# Patient Record
Sex: Female | Born: 1935 | Race: White | Hispanic: No | Smoking: Never smoker
Health system: Southern US, Community
[De-identification: ages and names within clinical notes are randomized; demographics above are authoritative.]

## PROBLEM LIST (undated history)

## (undated) DIAGNOSIS — E7800 Pure hypercholesterolemia, unspecified: Secondary | ICD-10-CM

## (undated) DIAGNOSIS — G250 Essential tremor: Secondary | ICD-10-CM

## (undated) DIAGNOSIS — Z9889 Other specified postprocedural states: Secondary | ICD-10-CM

## (undated) DIAGNOSIS — K219 Gastro-esophageal reflux disease without esophagitis: Secondary | ICD-10-CM

## (undated) DIAGNOSIS — I10 Essential (primary) hypertension: Secondary | ICD-10-CM

## (undated) DIAGNOSIS — R112 Nausea with vomiting, unspecified: Secondary | ICD-10-CM

## (undated) DIAGNOSIS — R42 Dizziness and giddiness: Secondary | ICD-10-CM

## (undated) HISTORY — PX: PACEMAKER IMPLANT: EP1218

---

## 2016-09-15 DIAGNOSIS — I10 Essential (primary) hypertension: Secondary | ICD-10-CM | POA: Diagnosis present

## 2016-09-15 DIAGNOSIS — E782 Mixed hyperlipidemia: Secondary | ICD-10-CM | POA: Diagnosis present

## 2016-09-15 DIAGNOSIS — I340 Nonrheumatic mitral (valve) insufficiency: Secondary | ICD-10-CM | POA: Insufficient documentation

## 2017-11-02 DIAGNOSIS — K219 Gastro-esophageal reflux disease without esophagitis: Secondary | ICD-10-CM | POA: Diagnosis present

## 2021-09-07 ENCOUNTER — Inpatient Hospital Stay (HOSPITAL_COMMUNITY)
Admission: AD | Admit: 2021-09-07 | Discharge: 2021-09-11 | DRG: 445 | Disposition: A | Discharge status: Home / Self Care | Payer: Medicare HMO | Source: Other Acute Inpatient Hospital | Attending: Internal Medicine | Admitting: Internal Medicine

## 2021-09-07 DIAGNOSIS — K838 Other specified diseases of biliary tract: Secondary | ICD-10-CM | POA: Diagnosis not present

## 2021-09-07 DIAGNOSIS — I495 Sick sinus syndrome: Secondary | ICD-10-CM | POA: Diagnosis present

## 2021-09-07 DIAGNOSIS — E8809 Other disorders of plasma-protein metabolism, not elsewhere classified: Secondary | ICD-10-CM | POA: Diagnosis present

## 2021-09-07 DIAGNOSIS — K831 Obstruction of bile duct: Secondary | ICD-10-CM | POA: Diagnosis present

## 2021-09-07 DIAGNOSIS — Z950 Presence of cardiac pacemaker: Secondary | ICD-10-CM | POA: Diagnosis not present

## 2021-09-07 DIAGNOSIS — N179 Acute kidney failure, unspecified: Secondary | ICD-10-CM | POA: Diagnosis present

## 2021-09-07 DIAGNOSIS — E876 Hypokalemia: Secondary | ICD-10-CM | POA: Diagnosis present

## 2021-09-07 DIAGNOSIS — K828 Other specified diseases of gallbladder: Secondary | ICD-10-CM | POA: Diagnosis present

## 2021-09-07 DIAGNOSIS — E785 Hyperlipidemia, unspecified: Secondary | ICD-10-CM | POA: Diagnosis present

## 2021-09-07 DIAGNOSIS — K219 Gastro-esophageal reflux disease without esophagitis: Secondary | ICD-10-CM | POA: Diagnosis present

## 2021-09-07 DIAGNOSIS — I10 Essential (primary) hypertension: Secondary | ICD-10-CM | POA: Diagnosis present

## 2021-09-07 DIAGNOSIS — K5900 Constipation, unspecified: Secondary | ICD-10-CM | POA: Diagnosis present

## 2021-09-07 DIAGNOSIS — R17 Unspecified jaundice: Secondary | ICD-10-CM | POA: Diagnosis not present

## 2021-09-07 DIAGNOSIS — Z91041 Radiographic dye allergy status: Secondary | ICD-10-CM

## 2021-09-07 HISTORY — DX: Other specified postprocedural states: Z98.890

## 2021-09-07 HISTORY — DX: Nausea with vomiting, unspecified: R11.2

## 2021-09-07 LAB — COMPREHENSIVE METABOLIC PANEL
ALT: 310 U/L — ABNORMAL HIGH (ref 0–44)
AST: 216 U/L — ABNORMAL HIGH (ref 15–41)
Albumin: 3 g/dL — ABNORMAL LOW (ref 3.5–5.0)
Alkaline Phosphatase: 249 U/L — ABNORMAL HIGH (ref 38–126)
Anion gap: 6 (ref 5–15)
BUN: 10 mg/dL (ref 8–23)
CO2: 25 mmol/L (ref 22–32)
Calcium: 9.4 mg/dL (ref 8.9–10.3)
Chloride: 106 mmol/L (ref 98–111)
Creatinine, Ser: 1.16 mg/dL — ABNORMAL HIGH (ref 0.44–1.00)
GFR, Estimated: 46 mL/min — ABNORMAL LOW (ref >60)
Glucose, Bld: 115 mg/dL — ABNORMAL HIGH (ref 70–99)
Potassium: 3.8 mmol/L (ref 3.5–5.1)
Sodium: 137 mmol/L (ref 135–145)
Total Bilirubin: 6.8 mg/dL — ABNORMAL HIGH (ref 0.3–1.2)
Total Protein: 5.9 g/dL — ABNORMAL LOW (ref 6.5–8.1)

## 2021-09-07 LAB — PROTIME-INR
INR: 1.1 (ref 0.8–1.2)
Prothrombin Time: 13.7 seconds (ref 11.4–15.2)

## 2021-09-07 MED ORDER — PRIMIDONE 50 MG PO TABS
50.0000 mg | ORAL_TABLET | Freq: Every day | ORAL | Status: DC | PRN
  Filled 2021-09-07: qty 1

## 2021-09-07 MED ORDER — MECLIZINE HCL 12.5 MG PO TABS
12.5000 mg | ORAL_TABLET | Freq: Every day | ORAL | Status: DC | PRN
  Filled 2021-09-07: qty 2

## 2021-09-07 MED ORDER — NEBIVOLOL HCL 2.5 MG PO TABS
2.5000 mg | ORAL_TABLET | Freq: Every morning | ORAL | Status: DC
  Filled 2021-09-07: qty 1

## 2021-09-07 MED ORDER — SODIUM CHLORIDE 0.9 % IV SOLN: INTRAVENOUS | Status: AC

## 2021-09-07 MED ORDER — PANTOPRAZOLE SODIUM 40 MG PO TBEC
40.0000 mg | DELAYED_RELEASE_TABLET | Freq: Every day | ORAL | Status: DC
  Administered 2021-09-07 – 2021-09-11 (×4): 40 mg via ORAL
  Filled 2021-09-07 (×4): qty 1

## 2021-09-07 MED ORDER — ENOXAPARIN SODIUM 30 MG/0.3ML IJ SOSY
30.0000 mg | PREFILLED_SYRINGE | INTRAMUSCULAR | Status: DC
  Administered 2021-09-07 – 2021-09-08 (×2): 30 mg via SUBCUTANEOUS
  Filled 2021-09-07 (×4): qty 0.3

## 2021-09-07 MED ORDER — ONDANSETRON HCL 4 MG PO TABS: 4.0000 mg | ORAL_TABLET | Freq: Four times a day (QID) | ORAL | Status: DC | PRN

## 2021-09-07 MED ORDER — PRAVASTATIN SODIUM 40 MG PO TABS: 40.0000 mg | ORAL_TABLET | Freq: Every day | ORAL | Status: DC

## 2021-09-07 MED ORDER — AMLODIPINE BESYLATE 5 MG PO TABS: 2.5000 mg | ORAL_TABLET | Freq: Every day | ORAL | Status: DC

## 2021-09-07 MED ORDER — GABAPENTIN 100 MG PO CAPS
100.0000 mg | ORAL_CAPSULE | Freq: Two times a day (BID) | ORAL | Status: DC
Start: 2021-09-07 — End: 2021-09-11
  Administered 2021-09-07 – 2021-09-11 (×7): 100 mg via ORAL
  Filled 2021-09-07 (×7): qty 1

## 2021-09-07 MED ORDER — TIZANIDINE HCL 2 MG PO TABS
1.0000 mg | ORAL_TABLET | Freq: Every day | ORAL | Status: DC | PRN
  Administered 2021-09-09: 1 mg via ORAL
  Filled 2021-09-07 (×2): qty 0.5

## 2021-09-07 MED ORDER — HYDRALAZINE HCL 25 MG PO TABS
25.0000 mg | ORAL_TABLET | Freq: Four times a day (QID) | ORAL | Status: DC | PRN
  Administered 2021-09-08: 25 mg via ORAL
  Filled 2021-09-07: qty 1

## 2021-09-07 MED ORDER — ONDANSETRON HCL 4 MG/2ML IJ SOLN
4.0000 mg | Freq: Four times a day (QID) | INTRAMUSCULAR | Status: DC | PRN
  Administered 2021-09-09: 4 mg via INTRAVENOUS
  Filled 2021-09-07: qty 2

## 2021-09-07 MED ORDER — ASPIRIN EC 81 MG PO TBEC
81.0000 mg | DELAYED_RELEASE_TABLET | Freq: Every day | ORAL | Status: DC
  Administered 2021-09-09 – 2021-09-10 (×2): 81 mg via ORAL
  Filled 2021-09-07 (×2): qty 1

## 2021-09-07 NOTE — H&P (Addendum)
?History and Physical  ? ? ?Meagan [name] UXN:[id number] DOB: 1935/03/17 DOA: 09/07/2021 ? ?PCP: Abigail Miyamoto, [place] (Confirm with patient/family/NH records and if not entered, this has to be entered at [facility] point of entry) ?Patient coming from: Home ? ?I have personally briefly reviewed patient's old medical records in [place] ? ?Chief Complaint: Skin turned yellow ? ?HPI: Meagan [name] is a 87 y.o. female with medical history significant of HTN, SSS s/p PPM in August 2022, GERD came with new onset of jaundice. ? ?Patient started to notice urine culture returned dark about 10 days ago, with stool color claylike.  She has been trying to drink more water and the whole the urine color will return later but that did not happen.  Denies any abdominal pain no nauseous vomiting.  She has been eating healthy food and exercise more in the last 6 pounds since last year.  No history of alcohol use or cigarette smoke.  Yesterday, her daughter came in and found " skin color turned yellow" and took her to the ED. ? ?ED Course: Afebrile, no tachycardia, blood pressure 183/81.  Blood work showed bilirubin 8.2, AST 260, ALT 263, alkaline phosphorus 307, CT abdominal pelvis normal morphology of liver with dilated gallbladder and gallbladder sludge but no significant malignancy. ? ?Review of Systems: As per HPI otherwise 14 point review of systems negative.  ? ? ?No past medical history on file. ? ? ? has no history on file for tobacco use, alcohol use, and drug use. ? ?Allergies  ?Allergen Reactions  ? Ivp Dye [Iodinated Contrast Media] Hives  ? Lisinopril Cough  ? ? ?No family history on file. ? ? ?Prior to Admission medications   ?Not on File  ? ? ?Physical Exam: ?Vitals:  ? 09/07/21 1517  ?BP: (!) 167/74  ?Pulse: 71  ?Resp: 16  ?Temp: (!) 97.4 ?F (36.3 ?C)  ?TempSrc: Oral  ?SpO2: 100%  ? ? ?Constitutional: NAD, calm, comfortable ?Vitals:  ? 09/07/21 1517  ?BP: (!) 167/74  ?Pulse: 71  ?Resp: 16  ?Temp: (!) 97.4 ?F  (36.3 ?C)  ?TempSrc: Oral  ?SpO2: 100%  ? ?Eyes: PERRL, lids and conjunctivae normal ?ENMT: Mucous membranes are moist. Posterior pharynx clear of any exudate or lesions.Normal dentition.  ?Neck: normal, supple, no masses, no thyromegaly ?Respiratory: clear to auscultation bilaterally, no wheezing, no crackles. Normal respiratory effort. No accessory muscle use.  ?Cardiovascular: Regular rate and rhythm, no murmurs / rubs / gallops. No extremity edema. 2+ pedal pulses. No carotid bruits.  ?Abdomen: no tenderness, no masses palpated. No hepatosplenomegaly. Bowel sounds positive.  ?Musculoskeletal: no clubbing / cyanosis. No joint deformity upper and lower extremities. Good ROM, no contractures. Normal muscle tone.  ?Skin: Jaundice ?Neurologic: CN 2-12 grossly intact. Sensation intact, DTR normal. Strength 5/5 in all 4.  ?Psychiatric: Normal judgment and insight. Alert and oriented x 3. Normal mood.  ? ? ? ?Labs on Admission: I have personally reviewed following labs and imaging studies ? ?CBC: ?No results for input(s): WBC, NEUTROABS, HGB, HCT, MCV, PLT in the last 168 hours. ?Basic Metabolic Panel: ?No results for input(s): NA, K, CL, CO2, GLUCOSE, BUN, CREATININE, CALCIUM, MG, PHOS in the last 168 hours. ?GFR: ?CrCl cannot be calculated (No successful lab value found.). ?Liver Function Tests: ?No results for input(s): AST, ALT, ALKPHOS, BILITOT, PROT, ALBUMIN in the last 168 hours. ?No results for input(s): LIPASE, AMYLASE in the last 168 hours. ?No results for input(s): AMMONIA in the last 168 hours. ?Coagulation Profile: ?  No results for input(s): INR, PROTIME in the last 168 hours. ?Cardiac Enzymes: ?No results for input(s): CKTOTAL, CKMB, CKMBINDEX, TROPONINI in the last 168 hours. ?BNP (last 3 results) ?No results for input(s): PROBNP in the last 8760 hours. ?HbA1C: ?No results for input(s): HGBA1C in the last 72 hours. ?CBG: ?No results for input(s): GLUCAP in the last 168 hours. ?Lipid Profile: ?No results  for input(s): CHOL, HDL, LDLCALC, TRIG, CHOLHDL, LDLDIRECT in the last 72 hours. ?Thyroid Function Tests: ?No results for input(s): TSH, T4TOTAL, FREET4, T3FREE, THYROIDAB in the last 72 hours. ?Anemia Panel: ?No results for input(s): VITAMINB12, FOLATE, FERRITIN, TIBC, IRON, RETICCTPCT in the last 72 hours. ?Urine analysis: ?No results found for: COLORURINE, APPEARANCEUR, [place], [place], [place], [place], [place], KETONESUR, PROTEINUR, [place], NITRITE, LEUKOCYTESUR ? ?Radiological Exams on Admission: ?No results found. ? ?EKG: Ordered ? ?Assessment/Plan ?Principal Problem: ?  Jaundice ? (please populate well all problems here in Problem List. (For example, if patient is on BP meds at home and you resume or decide to hold them, it is a problem that needs to be her. Same for CAD, COPD, HLD and so on) ? ?Painless jaundice ?-Rule out pancreatic/biliary malignancy in her age bracket.  Ordered MRI with contrast.  Discussed with on-call GI Dr. Benson Norway, recommend MRCP and re-consult GI tomorrow after MRI for possible ERCP. NPO after midnight. ?-Check AFP and CA 19-9. ?-Low suspicion for cholangitis or acute cholecystitis at this point given no RUQ pain or fever. ? ?Acute transaminitis ?-Likely secondary to biliary obstruction and liver damage.  Trend LFTs.  Check INR. ? ?HTN, uncontrolled ?-Resume home BP meds ?-As needed hydralazine ? ?GERD ?-Continue PPI ? ?DVT prophylaxis: Lovenox ?Code Status: Full code ?Family Communication: None at bedside ?Disposition Plan: Patient came with painless jaundice, suspicious for.  Malignancy, expect more than 2 midnight hospital stay for GI work-up. ?Consults called:" GI Dr. Benson Norway, reconsult GI tomorrow after MRI ?Marland Kitchen ?Admission status: MedSurg admission ? ? ?Lequita Halt MD ?[facility] ?Pager [phone] ? ?09/07/2021, 4:03 PM  ?  ?

## 2021-09-08 ENCOUNTER — Inpatient Hospital Stay (HOSPITAL_COMMUNITY): Payer: Medicare HMO

## 2021-09-08 DIAGNOSIS — R17 Unspecified jaundice: Secondary | ICD-10-CM | POA: Diagnosis not present

## 2021-09-08 LAB — CBC
HCT: 30.5 % — ABNORMAL LOW (ref 36.0–46.0)
Hemoglobin: 10.4 g/dL — ABNORMAL LOW (ref 12.0–15.0)
MCH: 32 pg (ref 26.0–34.0)
MCHC: 34.1 g/dL (ref 30.0–36.0)
MCV: 93.8 fL (ref 80.0–100.0)
Platelets: 227 10*3/uL (ref 150–400)
RBC: 3.25 MIL/uL — ABNORMAL LOW (ref 3.87–5.11)
RDW: 15.1 % (ref 11.5–15.5)
WBC: 5.7 10*3/uL (ref 4.0–10.5)
nRBC: 0 % (ref 0.0–0.2)

## 2021-09-08 LAB — COMPREHENSIVE METABOLIC PANEL
ALT: 293 U/L — ABNORMAL HIGH (ref 0–44)
AST: 211 U/L — ABNORMAL HIGH (ref 15–41)
Albumin: 2.8 g/dL — ABNORMAL LOW (ref 3.5–5.0)
Alkaline Phosphatase: 214 U/L — ABNORMAL HIGH (ref 38–126)
Anion gap: 6 (ref 5–15)
BUN: 9 mg/dL (ref 8–23)
CO2: 24 mmol/L (ref 22–32)
Calcium: 9 mg/dL (ref 8.9–10.3)
Chloride: 108 mmol/L (ref 98–111)
Creatinine, Ser: 1.1 mg/dL — ABNORMAL HIGH (ref 0.44–1.00)
GFR, Estimated: 50 mL/min — ABNORMAL LOW (ref >60)
Glucose, Bld: 94 mg/dL (ref 70–99)
Potassium: 3.6 mmol/L (ref 3.5–5.1)
Sodium: 138 mmol/L (ref 135–145)
Total Bilirubin: 6.6 mg/dL — ABNORMAL HIGH (ref 0.3–1.2)
Total Protein: 5.3 g/dL — ABNORMAL LOW (ref 6.5–8.1)

## 2021-09-08 MED ORDER — NEBIVOLOL HCL 2.5 MG PO TABS
2.5000 mg | ORAL_TABLET | Freq: Every morning | ORAL | Status: DC
  Administered 2021-09-08 – 2021-09-11 (×4): 2.5 mg via ORAL
  Filled 2021-09-08 (×5): qty 1

## 2021-09-08 MED ORDER — GADOBUTROL 1 MMOL/ML IV SOLN
5.0000 mL | Freq: Once | INTRAVENOUS | Status: AC | PRN
  Administered 2021-09-08: 5 mL via INTRAVENOUS

## 2021-09-08 MED ORDER — AMLODIPINE BESYLATE 5 MG PO TABS
2.5000 mg | ORAL_TABLET | Freq: Every day | ORAL | Status: DC
  Administered 2021-09-08 – 2021-09-10 (×3): 2.5 mg via ORAL
  Filled 2021-09-08 (×3): qty 1

## 2021-09-08 NOTE — Progress Notes (Signed)
?  Transition of Care (TOC) Screening Note ? ? ?Patient Details  ?Name: Meagan [name] ?Date of Birth: 1934/06/18 ? ? ?Transition of Care (TOC) CM/SW Contact:    ?Cyndi Bender, RN ?Phone Number: ?09/08/2021, 8:34 AM ? ? ? ?Transition of Care Department [facility]) has reviewed patient and no TOC needs have been identified at this time. We will continue to monitor patient advancement through interdisciplinary progression rounds. If new patient transition needs arise, please place a TOC consult. ? ? ?

## 2021-09-08 NOTE — Consult Note (Addendum)
? ?Referring Provider: Dr. Shelly Coss ?Primary Care Physician:  Abigail Miyamoto, [place] ?Primary Gastroenterologist:  Dr. Althia Forts  ? ?Reason for Consultation: Elevated LFTs ? ?HPI: Meagan [name] is a 87 y.o. female  with a past medical history of hypertension, sick sinus syndrome s/p PPM 06/30/2021 and GERD.  She noticed her urine was darker amber in color which started about 10 days ago and her stools were pale beige in colon.  She had mild stomach discomfort for few days without nausea/vomiting or significant abdominal pain.  She contacted her PCP and she was sent to the to [facility] ED 09/07/2021 further admission.  Labs in the ED showed elevated LFTs.  CTAP without contrast showed a simple left liver cyst and a mildly dilated gallbladder with hyperdense fluid suggestive of sludge without evidence of gallstones.  A moderate amount of stool was noted in the colon.  She was transferred to [facility] for further evaluation and for consideration for abdominal MRI/MRCP if her pacemaker patible for MR imaging. Admission labs showed a total bilirubin level of 6.8.  Alk phos 249.  AST 216.  ALT 310.  WBC 5.7.  Hemoglobin 10.4.  Hematocrit 30.5.  Platelet 227.  INR 1.1.  AFP pending CA 19-9 pending.  She vomited shortly after she was admitted to [facility] which she attributed to having motion sickness during ambulance transport.  No further vomiting since then.  She describes having a vague upper abdominal discomfort which started about 1 week ago.  No new medications within the past 6 months with the exception of taking Keflex for 3 days post pacemaker placement 06/2021.  She has been on Pravachol for many years.  No NSAID use.  No alcohol use.  He has lost 5 pounds over the past year.  No fever, sweats or chills.  No family history of liver or pancreatic cancer.  She underwent 3 colonoscopies in her lifetime which she reported were all normal.  Last colonoscopy was in her mid 65s.  She is a  remote history of GERD which is well controlled on Omeprazole 40 mg daily she denies ever having an EGD.  Her sister is at the bedside.  No other complaints at this time. ? ? ? ? ? ?Prior to Admission medications   ?Medication Sig Start Date End Date Taking? Authorizing Provider  ?amLODipine (NORVASC) 2.5 MG tablet Take 2.5 mg by mouth at bedtime.   Yes [provider]  ?aspirin EC 81 MG tablet Take 81 mg by mouth daily after supper. Swallow whole.   Yes [provider]  ?Cholecalciferol (VITAMIN D3) 50 MCG (2000 UT) TABS Take 2,000 Units by mouth daily after supper.   Yes [provider]  ?Coenzyme Q10 (CO Q-10) 100 MG CAPS Take 100 mg by mouth every evening.   Yes [provider]  ?gabapentin (NEURONTIN) 100 MG capsule Take 100 mg by mouth 2 (two) times daily.   Yes [provider]  ?meclizine (ANTIVERT) 25 MG tablet Take 12.5-25 mg by mouth daily as needed for dizziness.   Yes [provider]  ?Misc Natural Products (JOINT SUPPORT) CAPS Take 1 capsule by mouth daily with breakfast.   Yes [provider]  ?Multiple Vitamin (MULTIVITAMIN) tablet Take 1 tablet by mouth daily with breakfast.   Yes [provider]  ?Multiple Vitamins-Minerals ([place]) CAPS Take 1 capsule by mouth daily.   Yes [provider]  ?nebivolol (BYSTOLIC) 5 MG tablet Take 2.5 mg by  mouth in the morning.   Yes [provider]  ?omeprazole (PRILOSEC) 40 MG capsule Take 40 mg by mouth daily before breakfast.   Yes [provider]  ?pravastatin (PRAVACHOL) 40 MG tablet Take 40 mg by mouth daily after supper.   Yes [provider]  ?primidone (MYSOLINE) 50 MG tablet Take 50 mg by mouth daily as needed (for tremors).   Yes [provider]  ?pyridOXINE (VITAMIN B-6) 50 MG tablet Take 50 mg by mouth daily.   Yes [provider]  ?tiZANidine (ZANAFLEX) 2 MG tablet Take 1 mg by mouth daily as needed for  muscle spasms.   Yes [provider]  ? ? ?Current Facility-Administered Medications  ?Medication Dose Route Frequency Provider Last Rate Last Admin  ? aspirin EC tablet 81 mg  81 mg Oral QPC supper Wynetta Fines T, MD      ? enoxaparin (LOVENOX) injection 30 mg  30 mg Subcutaneous Q24H Wynetta Fines T, MD   30 mg at 09/07/21 2135  ? gabapentin (NEURONTIN) capsule 100 mg  100 mg Oral BID Wynetta Fines T, MD   100 mg at 09/07/21 2101  ? hydrALAZINE (APRESOLINE) tablet 25 mg  25 mg Oral Q6H PRN Wynetta Fines T, MD      ? meclizine (ANTIVERT) tablet 12.5-25 mg  12.5-25 mg Oral Daily PRN Lequita Halt, MD      ? ondansetron [facility]) tablet 4 mg  4 mg Oral Q6H PRN Wynetta Fines T, MD      ? Or  ? ondansetron [facility]) injection 4 mg  4 mg Intravenous Q6H PRN Wynetta Fines T, MD      ? pantoprazole (PROTONIX) EC tablet 40 mg  40 mg Oral Daily Wynetta Fines T, MD   40 mg at 09/07/21 2047  ? primidone (MYSOLINE) tablet 50 mg  50 mg Oral Daily PRN Wynetta Fines T, MD      ? tiZANidine (ZANAFLEX) tablet 1 mg  1 mg Oral Daily PRN Lequita Halt, MD      ? ? ?Allergies as of 09/06/2021  ? (Not on File)  ? ? ?No family history on file. ? ?Social History  ? ?Socioeconomic History  ? Marital status: Married  ?  Spouse name: Not on file  ? Number of children: Not on file  ? Years of education: Not on file  ? Highest education level: Not on file  ?Occupational History  ? Not on file  ?Tobacco Use  ? Smoking status: Not on file  ? Smokeless tobacco: Not on file  ?Substance and Sexual Activity  ? Alcohol use: Not on file  ? Drug use: Not on file  ? Sexual activity: Not on file  ?Other Topics Concern  ? Not on file  ?Social History Narrative  ? Not on file  ? ?Social Determinants of Health  ? ?Financial Resource Strain: Not on file  ?Food Insecurity: Not on file  ?Transportation Needs: Not on file  ?Physical Activity: Not on file  ?Stress: Not on file  ?Social Connections: Not on file  ?Intimate Partner Violence: Not on file  ? ? ?Review of  Systems: ?Gen: Denies fever, sweats or chills. No significant weight loss.  ?CV: Denies chest pain, palpitations or edema. + Icemaker. ?Resp: Denies cough, shortness of breath of hemoptysis.  ?GI: See HPI.    ?GU : Darker urine. ?MS: Denies joint pain, muscles aches or weakness. ?Derm: Denies rash, itchiness, skin lesions or unhealing ulcers. ?Psych: Denies depression,  anxiety, memory loss, suicidal ideation or confusion. ?Heme: Denies easy bruising, bleeding. ?Neuro:  Denies headaches, dizziness or paresthesias. ?Endo:  Denies any problems with DM, thyroid or adrenal function. ? ?Physical Exam: ?Vital signs in last 24 hours: ?Temp:  [97.4 ?F (36.3 ?C)-98 ?F (36.7 ?C)] 97.4 ?F (36.3 ?C) (04/10 0753) ?Pulse Rate:  [71-77] 72 (04/10 0753) ?Resp:  [12-18] 15 (04/10 0753) ?BP: (114-167)/(59-74) 162/72 (04/10 0753) ?SpO2:  [93 %-100 %] 99 % (04/10 0753) ?Weight:  [55.5 kg] 55.5 kg (04/09 1923) ?Last BM Date : 09/07/21 ?General:  Alert 87 year old female in no acute distress ?Head:  Normocephalic and atraumatic. ?Eyes: Moderate scleral icterus.  Conjunctiva pink. ?Ears:  Normal auditory acuity. ?Nose:  No deformity, discharge or lesions. ?Mouth:  Dentition intact. No ulcers or lesions.  ?Neck:  Supple. No lymphadenopathy or thyromegaly.  ?Lungs: Sounds clear throughout ?Heart: No rate and rhythm, no murmurs.  Pacemaker generator to the left subclavian chest wall area. ?Abdomen: Soft, mild lower abdominal distention.  Soft.  Nontender.  Positive bowel sounds to all 4 quadrants.  No palpable mass.  No hepatomegaly.  No splenomegaly. ?Rectal: Deferred. ?Musculoskeletal:  Symmetrical without gross deformities.  ?Pulses:  Normal pulses noted. ?Extremities:  Without clubbing or edema. ?Neurologic:  Alert and  oriented x 4. No focal deficits.  ?Skin:  + Jaundice.  ?Psych:  Alert and cooperative. Normal mood and affect. ? ?Intake/Output from previous day: ?04/09 0701 - 04/10 0700 ?In: 940 [P.O.:240; I.V.:700] ?Out: -   ?Intake/Output this shift: ?No intake/output data recorded. ? ?Lab Results: ?Recent Labs  ?  09/08/21 ?0037  ?WBC 5.7  ?HGB 10.4*  ?HCT 30.5*  ?PLT 227  ? ?BMET ?Recent Labs  ?  09/07/21 ?1725 09/08/21 ?0037  ?NA 137 1

## 2021-09-08 NOTE — Progress Notes (Signed)
?PROGRESS NOTE ? ?Meagan [name]  QAS:[id number] DOB: 1935/03/02 DOA: 09/07/2021 ?PCP: Abigail Miyamoto, [place]  ? ?Brief Narrative: ?Patient is a 87 year old female with history of hypertension, sick sinus syndrome status post pacemaker placement on August 2022, GERD who presented from home with complaints of jaundice.  She also complained of dark urine, pale stools.  On presentation she was hypertensive.  Lab work showed elevated bilirubin, elevated liver enzymes.  CT abdomen/pelvis done at [facility] showed mildly dilated gallbladder, gallbladder sludge but no evidence of malignancy, normal morphology of liver.  MRCP has been ordered but she has a pacemaker.  GI consulted today. ? ?Assessment & Plan: ? ?Principal Problem: ?  Jaundice ? ? ?Painless jaundice: Patient with dark urine, pale stools, icterus.  No report of abdominal pain. CT abdomen/pelvis done at [facility] showed mildly dilated gallbladder, gallbladder sludge but no evidence of malignancy, normal morphology of liver.  MRCP has been ordered but she has a pacemaker.  GI consulted today.   Also ordered AFP, CA 19-19.   ? ?Elevated liver enzymes: Unclear etiology.  Could be secondary to biliary obstruction.  Will monitor ? ?Hypertension: Presented with hypertension.  Resumed home medications.  Monitor ? ?GERD: Continue PPI ? ?History of hyperlipidemia: Takes pravastatin which is on hold due to elevated liver enzymes ? ?  ? ? ?  ?  ? ?DVT prophylaxis:enoxaparin (LOVENOX) injection 30 mg Start: 09/07/21 2200 ? ? ?  Code Status: Full Code ? ?Family Communication: Daughter at the bedside ? ?Patient status:Inpatient ? ?Patient is from :home ? ?Anticipated discharge [id number] ? ?Estimated DC date:1-2 days ? ? ?Consultants: GI ? ?Procedures: None ? ?Antimicrobials:  ?Anti-infectives (From admission, onward)  ? ? None  ? ?  ? ? ?Subjective: ?Patient seen and examined at the bedside this morning.  Hemodynamically stable.  She looks icteric.  Denies any abdomen  pain, nausea or vomiting. ? ?Objective: ?Vitals:  ? 09/07/21 1517 09/07/21 1923 09/07/21 2343 09/08/21 0305  ?BP: (!) 167/74 135/64 139/63 (!) 114/59  ?Pulse: 71 75 73 77  ?Resp: '16 15 18 12  '$ ?Temp: (!) 97.4 ?F (36.3 ?C) 98 ?F (36.7 ?C) 97.9 ?F (36.6 ?C) 98 ?F (36.7 ?C)  ?TempSrc: Oral Oral Oral Oral  ?SpO2: 100% 96% 98% 93%  ?Weight:  55.5 kg    ?Height:  '5\' 6"'$  (1.676 m)    ? ? ?Intake/Output Summary (Last 24 hours) at 09/08/2021 0750 ?Last data filed at 09/08/2021 0400 ?Gross per 24 hour  ?Intake 940 ml  ?Output --  ?Net 940 ml  ? ?Filed Weights  ? 09/07/21 1923  ?Weight: 55.5 kg  ? ? ?Examination: ? ?General exam: Overall comfortable, not in distress, pleasant elderly female ?HEENT: PERRL ?Respiratory system:  no wheezes or crackles  ?Cardiovascular system: S1 & S2 heard, RRR.  ?Gastrointestinal system: Abdomen is nondistended, soft and nontender. ?Central nervous system: Alert and oriented ?Extremities: No edema, no clubbing ,no cyanosis ?Skin: Icterus ? ? ?Data Reviewed: I have personally reviewed following labs and imaging studies ? ?CBC: ?Recent Labs  ?Lab 09/08/21 ?0037  ?WBC 5.7  ?HGB 10.4*  ?HCT 30.5*  ?MCV 93.8  ?PLT 227  ? ?Basic Metabolic Panel: ?Recent Labs  ?Lab 09/07/21 ?1725 09/08/21 ?0037  ?NA 137 138  ?K 3.8 3.6  ?CL 106 108  ?CO2 25 24  ?GLUCOSE 115* 94  ?BUN 10 9  ?CREATININE 1.16* 1.10*  ?CALCIUM 9.4 9.0  ? ? ? ?No results found for this or any previous visit (from  the past 240 hour(s)).  ? ?Radiology Studies: ?No results found. ? ?Scheduled Meds: ? aspirin EC  81 mg Oral QPC supper  ? enoxaparin (LOVENOX) injection  30 mg Subcutaneous Q24H  ? gabapentin  100 mg Oral BID  ? pantoprazole  40 mg Oral Daily  ? pravastatin  40 mg Oral QPC supper  ? ?Continuous Infusions: ? sodium chloride 100 mL/hr at 09/08/21 0543  ? ? ? LOS: 1 day  ? ?Shelly Coss, MD ?[facility] ?P4/03/2022, 7:50 AM   ?

## 2021-09-08 NOTE — Progress Notes (Signed)
Patient tolerated MRI without incident. Pacemaker re-programmed per RN Marylyn Ishihara. Patient in no distress. ?

## 2021-09-08 NOTE — Progress Notes (Signed)
Patient to MRI from [place] for MRCP w wo contrast. Patient has Chiropractor. [facility] with [facility]. Orders for DOO 85. Will re-program once scan is complete.  ?

## 2021-09-09 DIAGNOSIS — R17 Unspecified jaundice: Secondary | ICD-10-CM | POA: Diagnosis not present

## 2021-09-09 LAB — COMPREHENSIVE METABOLIC PANEL
ALT: 320 U/L — ABNORMAL HIGH (ref 0–44)
AST: 204 U/L — ABNORMAL HIGH (ref 15–41)
Albumin: 2.9 g/dL — ABNORMAL LOW (ref 3.5–5.0)
Alkaline Phosphatase: 223 U/L — ABNORMAL HIGH (ref 38–126)
Anion gap: 7 (ref 5–15)
BUN: 14 mg/dL (ref 8–23)
CO2: 24 mmol/L (ref 22–32)
Calcium: 9.5 mg/dL (ref 8.9–10.3)
Chloride: 106 mmol/L (ref 98–111)
Creatinine, Ser: 1.32 mg/dL — ABNORMAL HIGH (ref 0.44–1.00)
GFR, Estimated: 40 mL/min — ABNORMAL LOW (ref >60)
Glucose, Bld: 126 mg/dL — ABNORMAL HIGH (ref 70–99)
Potassium: 3.4 mmol/L — ABNORMAL LOW (ref 3.5–5.1)
Sodium: 137 mmol/L (ref 135–145)
Total Bilirubin: 8.4 mg/dL — ABNORMAL HIGH (ref 0.3–1.2)
Total Protein: 5.8 g/dL — ABNORMAL LOW (ref 6.5–8.1)

## 2021-09-09 LAB — CBC WITH DIFFERENTIAL/PLATELET
Abs Immature Granulocytes: 0.01 10*3/uL (ref 0.00–0.07)
Basophils Absolute: 0.1 10*3/uL (ref 0.0–0.1)
Basophils Relative: 1 %
Eosinophils Absolute: 0.1 10*3/uL (ref 0.0–0.5)
Eosinophils Relative: 1 %
HCT: 33 % — ABNORMAL LOW (ref 36.0–46.0)
Hemoglobin: 11.2 g/dL — ABNORMAL LOW (ref 12.0–15.0)
Immature Granulocytes: 0 %
Lymphocytes Relative: 21 %
Lymphs Abs: 1.2 10*3/uL (ref 0.7–4.0)
MCH: 31.7 pg (ref 26.0–34.0)
MCHC: 33.9 g/dL (ref 30.0–36.0)
MCV: 93.5 fL (ref 80.0–100.0)
Monocytes Absolute: 0.7 10*3/uL (ref 0.1–1.0)
Monocytes Relative: 13 %
Neutro Abs: 3.6 10*3/uL (ref 1.7–7.7)
Neutrophils Relative %: 64 %
Platelets: 237 10*3/uL (ref 150–400)
RBC: 3.53 MIL/uL — ABNORMAL LOW (ref 3.87–5.11)
RDW: 15.3 % (ref 11.5–15.5)
WBC: 5.6 10*3/uL (ref 4.0–10.5)
nRBC: 0 % (ref 0.0–0.2)

## 2021-09-09 LAB — HEPATITIS PANEL, ACUTE
HCV Ab: NONREACTIVE
Hep A IgM: NONREACTIVE
Hep B C IgM: NONREACTIVE
Hepatitis B Surface Ag: NONREACTIVE

## 2021-09-09 LAB — BILIRUBIN, DIRECT: Bilirubin, Direct: 5.7 mg/dL — ABNORMAL HIGH (ref 0.0–0.2)

## 2021-09-09 LAB — AFP TUMOR MARKER: AFP, Serum, Tumor Marker: 4.6 ng/mL (ref 0.0–8.7)

## 2021-09-09 LAB — CANCER ANTIGEN 19-9: CA 19-9: 224 U/mL — ABNORMAL HIGH (ref 0–35)

## 2021-09-09 MED ORDER — SODIUM CHLORIDE 0.9 % IV SOLN: INTRAVENOUS | Status: DC

## 2021-09-09 MED ORDER — POTASSIUM CHLORIDE CRYS ER 20 MEQ PO TBCR
40.0000 meq | EXTENDED_RELEASE_TABLET | Freq: Once | ORAL | Status: AC
  Administered 2021-09-09: 40 meq via ORAL
  Filled 2021-09-09: qty 2

## 2021-09-09 NOTE — Progress Notes (Signed)
?PROGRESS NOTE ? ?Meagan [name]  TFT:[id number] DOB: September 21, 1934 DOA: 09/07/2021 ?PCP: Abigail Miyamoto, [place]  ? ?Brief Narrative: ?Patient is a 87 year old female with history of hypertension, sick sinus syndrome status post pacemaker placement on August 2022, GERD who presented from home with complaints of jaundice.  She also complained of dark urine, pale stools.  On presentation, she was hypertensive.  Lab work showed elevated bilirubin, elevated liver enzymes.  CT abdomen/pelvis done at [facility] showed mildly dilated gallbladder, gallbladder sludge but no evidence of malignancy, normal morphology of liver.  MRCP done here showed Intrahepatic biliary ductal dilatation and apparent mild gallbladder wall thickening.  Lab work showed elevated CA 19-9.  GI following.  Plan for further work-up ? ?Assessment & Plan: ? ?Principal Problem: ?  Jaundice ? ? ?Painless jaundice: Patient with dark urine, pale stools, icterus.  No report of abdominal pain. CT abdomen/pelvis done at [facility] showed mildly dilated gallbladder, gallbladder sludge but no evidence of malignancy, normal morphology of liver.  MRCP done here showed Intrahepatic biliary ductal dilatation and apparent mild gallbladder wall thickening.  Ultrasound of the right upper quadrant showed coarse heterogeneous hepatic echotexture, intra hepatic ?biliary dilatation, CBD of 9.7 mm. ?.  lab work showed elevated CA 19-9.  Normal AFP.  GI following.  Plan for further work-up. ? ?Elevated liver enzymes: Unclear etiology.  Could be secondary to biliary obstruction.  Will check hepatitis panel too. Will monitor.  Bili is up today. ? ?Mild AKI: Started on gentle IV fluids.  Monitor BMP ? ?Hypokalemia: Supplemented with potassium, monitor ? ?Hypertension: Presented with hypertension.  Resumed home medications.  Monitor ? ?GERD: Continue PPI ? ?History of hyperlipidemia: Takes pravastatin which is on hold due to elevated liver enzymes ? ?  ? ? ?  ?  ? ?DVT  prophylaxis:enoxaparin (LOVENOX) injection 30 mg Start: 09/07/21 2200 ? ? ?  Code Status: Full Code ? ?Family Communication: Daughter at the bedside on 4/11 ? ?Patient status:Inpatient ? ?Patient is from :home ? ?Anticipated discharge HC:[facility] ? ?Estimated DC date: After completion of work-up ? ? ?Consultants: GI ? ?Procedures: None ? ?Antimicrobials:  ?Anti-infectives (From admission, onward)  ? ? None  ? ?  ? ? ?Subjective: ?Patient seen and examined at the bedside this morning.  Appears little anxious today because of her results.  Complains of some right upper quadrant discomfort, nausea ? ?Objective: ?Vitals:  ? 09/08/21 2036 09/08/21 2307 09/09/21 0425 09/09/21 6283  ?BP: (!) 165/71 (!) 147/73 131/61 (!) 172/50  ?Pulse: 74 71 71 74  ?Resp: '18 18 18 19  '$ ?Temp: 98.3 ?F (36.8 ?C) 98.3 ?F (36.8 ?C) 97.8 ?F (36.6 ?C) 97.8 ?F (36.6 ?C)  ?TempSrc: Oral Oral Oral Oral  ?SpO2: 100% 99% 99%   ?Weight:      ?Height:      ? ?No intake or output data in the 24 hours ending 09/09/21 1119 ? ?Filed Weights  ? 09/07/21 1923  ?Weight: 55.5 kg  ? ? ?Examination: ? ?General exam: Overall comfortable, not in distress, pleasant elderly female ?HEENT: PERRL ?Respiratory system:  no wheezes or crackles  ?Cardiovascular system: S1 & S2 heard, RRR.  ?Gastrointestinal system: Abdomen is nondistended, soft and nontender. ?Central nervous system: Alert and oriented ?Extremities: No edema, no clubbing ,no cyanosis ?Skin: Icterus ? ? ?Data Reviewed: I have personally reviewed following labs and imaging studies ? ?CBC: ?Recent Labs  ?Lab 09/08/21 ?1517 09/09/21 ?6160  ?WBC 5.7 5.6  ?NEUTROABS  --  3.6  ?HGB 10.4*  11.2*  ?HCT 30.5* 33.0*  ?MCV 93.8 93.5  ?PLT 227 237  ? ?Basic Metabolic Panel: ?Recent Labs  ?Lab 09/07/21 ?1725 09/08/21 ?7741 09/09/21 ?2878  ?NA 137 138 137  ?K 3.8 3.6 3.4*  ?CL 106 108 106  ?CO2 '25 24 24  '$ ?GLUCOSE 115* 94 126*  ?BUN '10 9 14  '$ ?CREATININE 1.16* 1.10* 1.32*  ?CALCIUM 9.4 9.0 9.5  ? ? ? ?No results found for this  or any previous visit (from the past 240 hour(s)).  ? ?Radiology Studies: ?DG Chest 1 View ? ?Result Date: 09/08/2021 ?CLINICAL DATA:  Pacemaker placement. EXAM: CHEST  1 VIEW COMPARISON:  None. FINDINGS: The heart size and mediastinal contours are within normal limits. Both lungs are clear. Left-sided pacemaker is noted with leads in grossly good position. No pneumothorax or pleural effusion is noted. The visualized skeletal structures are unremarkable. IMPRESSION: Left-sided pacemaker is noted with leads in grossly good position. Electronically Signed   By: Marijo Conception M.D.   On: 09/08/2021 10:11  ? ?MR ABDOMEN MRCP W WO CONTAST ? ?Result Date: 09/08/2021 ?CLINICAL DATA:  Abnormal urine and stools. EXAM: MRI ABDOMEN WITHOUT AND WITH CONTRAST (INCLUDING MRCP) TECHNIQUE: Multiplanar multisequence MR imaging of the abdomen was performed both before and after the administration of intravenous contrast. Heavily T2-weighted images of the biliary and pancreatic ducts were obtained, and three-dimensional MRCP images were rendered by post processing. CONTRAST:  31m GADAVIST GADOBUTROL 1 MMOL/ML IV SOLN COMPARISON:  None. FINDINGS: Study is significantly limited due to motion. Lower chest: No acute findings. Hepatobiliary: Liver is normal in size and contour with no suspicious mass identified. 1.1 cm cyst in the left hepatic lobe. No evidence of hepatic steatosis. No cholelithiasis visualized. There appears to be mild gallbladder wall thickening. Mild intrahepatic biliary ductal dilatation. Common bile duct is not well visualized. Pancreas: No obvious pancreatic mass or ductal dilatation visualized. Spleen:  Within normal limits in size and appearance. Adrenals/Urinary Tract: Adrenal glands appear grossly normal. Symmetric perfusion of the kidneys. Exophytic cyst of the right kidney measuring 4.2 cm. No hydronephrosis or definite enhancing renal mass identified bilaterally. Stomach/Bowel: No evidence of bowel obstruction.  Vascular/Lymphatic: No pathologically enlarged lymph nodes identified. No abdominal aortic aneurysm demonstrated. Other:  No ascites. Musculoskeletal: No suspicious bone lesions identified. IMPRESSION: 1. Study is significantly limited due to motion. 2. Intrahepatic biliary ductal dilatation and apparent mild gallbladder wall thickening. Common bile duct is not well visualized. No cholelithiasis visualized. Correlate clinically including with bilirubin for evidence of obstruction and consider evaluation with CT to reduce motion, or HIDA scan. 3. Right renal cyst.  Left hepatic cyst. Electronically Signed   By: DOfilia NeasM.D.   On: 09/08/2021 16:18  ? ?UKoreaAbdomen Limited RUQ (LIVER/GB) ? ?Result Date: 09/08/2021 ?CLINICAL DATA:  Jaundice elevated LFT EXAM: ULTRASOUND ABDOMEN LIMITED RIGHT UPPER QUADRANT COMPARISON:  MRI 09/08/2021 FINDINGS: Gallbladder: No shadowing stones. Mild diffuse gallbladder wall thickening up to 3.7 mm. Negative sonographic Murphy. Common bile duct: Diameter: Dilated up to 9.7 mm. Liver: Coarse heterogeneous hepatic echotexture. Positive for intra hepatic biliary dilatation. Simple cyst in the left hepatic lobe measuring 14 mm, no follow-up imaging recommended. Portal vein is patent on color Doppler imaging with normal direction of blood flow towards the liver. Other: None. IMPRESSION: 1. Slightly distended gallbladder with diffuse wall thickening but no shadowing stone or sonographic Murphy. 2. Intra and extrahepatic biliary dilatation Electronically Signed   By: KDonavan FoilM.D.   On: 09/08/2021 19:41   ? ?  Scheduled Meds: ? amLODipine  2.5 mg Oral QHS  ? aspirin EC  81 mg Oral QPC supper  ? enoxaparin (LOVENOX) injection  30 mg Subcutaneous Q24H  ? gabapentin  100 mg Oral BID  ? nebivolol  2.5 mg Oral q AM  ? pantoprazole  40 mg Oral Daily  ? ?Continuous Infusions: ? sodium chloride 75 mL/hr at 09/09/21 0950  ? ? ? LOS: 2 days  ? ?Shelly Coss, MD ?[facility] ?P4/04/2022,  11:19 AM   ?

## 2021-09-09 NOTE — Progress Notes (Addendum)
? ? ? ?[facility] Progress Note ? ?CC:  Elevated LFTs, jaundice ? ?Subjective: She and RUQ pain last night which abated.  Decreased appetite.  No BM today.  No chest pain or shortness of breath.  Her sister is at the bedside. ? ? ?Objective:  ?Vital signs in last 24 hours: ?Temp:  [97.8 ?F (36.6 ?C)-98.3 ?F (36.8 ?C)] 97.8 ?F (36.6 ?C) (04/11 0425) ?Pulse Rate:  [71-78] 71 (04/11 0425) ?Resp:  [18] 18 (04/11 0425) ?BP: (131-179)/(61-73) 131/61 (04/11 0425) ?SpO2:  [99 %-100 %] 99 % (04/11 0425) ?Last BM Date : 09/07/21 ?General: 87 year old female fatigued appearing in no acute distress. ?Eyes: Scleral icterus present. ?Heart: Regular rate and rhythm, no murmurs. ?Pulm: Breath sounds clear throughout. ?Abdomen: Soft, nondistended.  Nontender.  Positive bowel sounds all 4 quadrants. ?Extremities: No lower extremity edema. ?Neurologic:  Alert and  oriented x 4. Grossly normal neurologically. ?Psych:  Alert and cooperative. Normal mood and affect. ?Skin: + Jaundice. ? ?Intake/Output from previous day: ?No intake/output data recorded. ?Intake/Output this shift: ?No intake/output data recorded. ? ?Lab Results: ?Recent Labs  ?  09/08/21 ?5300 09/09/21 ?5110  ?WBC 5.7 5.6  ?HGB 10.4* 11.2*  ?HCT 30.5* 33.0*  ?PLT 227 237  ? ?BMET ?Recent Labs  ?  09/07/21 ?1725 09/08/21 ?2111 09/09/21 ?7356  ?NA 137 138 137  ?K 3.8 3.6 3.4*  ?CL 106 108 106  ?CO2 25 24 24   ?GLUCOSE 115* 94 126*  ?BUN 10 9 14   ?CREATININE 1.16* 1.10* 1.32*  ?CALCIUM 9.4 9.0 9.5  ? ?LFT ?Recent Labs  ?  09/09/21 ?0218  ?PROT 5.8*  ?ALBUMIN 2.9*  ?AST 204*  ?ALT 320*  ?ALKPHOS 223*  ?BILITOT 8.4*  ? ?PT/INR ?Recent Labs  ?  09/07/21 ?1725  ?LABPROT 13.7  ?INR 1.1  ? ?Hepatitis Panel ?No results for input(s): HEPBSAG, HCVAB, HEPAIGM, HEPBIGM in the last 72 hours. ? ?DG Chest 1 View ? ?Result Date: 09/08/2021 ?CLINICAL DATA:  Pacemaker placement. EXAM: CHEST  1 VIEW COMPARISON:  None. FINDINGS: The heart size and mediastinal contours are within  normal limits. Both lungs are clear. Left-sided pacemaker is noted with leads in grossly good position. No pneumothorax or pleural effusion is noted. The visualized skeletal structures are unremarkable. IMPRESSION: Left-sided pacemaker is noted with leads in grossly good position. Electronically Signed   By: Marijo Conception M.D.   On: 09/08/2021 10:11  ? ?MR ABDOMEN MRCP W WO CONTAST ? ?Result Date: 09/08/2021 ?CLINICAL DATA:  Abnormal urine and stools. EXAM: MRI ABDOMEN WITHOUT AND WITH CONTRAST (INCLUDING MRCP) TECHNIQUE: Multiplanar multisequence MR imaging of the abdomen was performed both before and after the administration of intravenous contrast. Heavily T2-weighted images of the biliary and pancreatic ducts were obtained, and three-dimensional MRCP images were rendered by post processing. CONTRAST:  20m GADAVIST GADOBUTROL 1 MMOL/ML IV SOLN COMPARISON:  None. FINDINGS: Study is significantly limited due to motion. Lower chest: No acute findings. Hepatobiliary: Liver is normal in size and contour with no suspicious mass identified. 1.1 cm cyst in the left hepatic lobe. No evidence of hepatic steatosis. No cholelithiasis visualized. There appears to be mild gallbladder wall thickening. Mild intrahepatic biliary ductal dilatation. Common bile duct is not well visualized. Pancreas: No obvious pancreatic mass or ductal dilatation visualized. Spleen:  Within normal limits in size and appearance. Adrenals/Urinary Tract: Adrenal glands appear grossly normal. Symmetric perfusion of the kidneys. Exophytic cyst of the right kidney measuring 4.2 cm. No hydronephrosis or definite enhancing renal mass  identified bilaterally. Stomach/Bowel: No evidence of bowel obstruction. Vascular/Lymphatic: No pathologically enlarged lymph nodes identified. No abdominal aortic aneurysm demonstrated. Other:  No ascites. Musculoskeletal: No suspicious bone lesions identified. IMPRESSION: 1. Study is significantly limited due to motion. 2.  Intrahepatic biliary ductal dilatation and apparent mild gallbladder wall thickening. Common bile duct is not well visualized. No cholelithiasis visualized. Correlate clinically including with bilirubin for evidence of obstruction and consider evaluation with CT to reduce motion, or HIDA scan. 3. Right renal cyst.  Left hepatic cyst. Electronically Signed   By: Ofilia Neas M.D.   On: 09/08/2021 16:18  ? ?US Abdomen Limited RUQ (LIVER/GB) ? ?Result Date: 09/08/2021 ?CLINICAL DATA:  Jaundice elevated LFT EXAM: ULTRASOUND ABDOMEN LIMITED RIGHT UPPER QUADRANT COMPARISON:  MRI 09/08/2021 FINDINGS: Gallbladder: No shadowing stones. Mild diffuse gallbladder wall thickening up to 3.7 mm. Negative sonographic Murphy. Common bile duct: Diameter: Dilated up to 9.7 mm. Liver: Coarse heterogeneous hepatic echotexture. Positive for intra hepatic biliary dilatation. Simple cyst in the left hepatic lobe measuring 14 mm, no follow-up imaging recommended. Portal vein is patent on color Doppler imaging with normal direction of blood flow towards the liver. Other: None. IMPRESSION: 1. Slightly distended gallbladder with diffuse wall thickening but no shadowing stone or sonographic Murphy. 2. Intra and extrahepatic biliary dilatation Electronically Signed   By: Donavan Foil M.D.   On: 09/08/2021 19:41   ? ?Assessment / Plan: ? ?17) 87 year old female with painless jaundice with elevated total bili, al phos, AST and ALT levels.  Total bili 6.6 -> 8.4.  Alk phos 214 -> 223.  AST 211 -> 204.  ALT 293 -> 320.  CA 19-9 224.  AFP pending.  She is afebrile.  WBC 5.6.  Hemodynamically stable.  CTAP without contrast showed a mildly dilated gallbladder with hyperdense fluid suggestive of sludge without evidence of gallstones. RUQ sono identified a slightly distended gallbladder with diffuse wall thickening, CBD dilated to 9.7 mm, with intra and extrahepatic biliary ductal dilatation.  Abdominal MRI/MRCP 09/08/2021 identified intrahepatic  biliary ductal dilatation with gallbladder wall thickening, the CBD was not well visualized.  The study was significantly limited due to motion artifact. She had nausea with mild RUQ pain last night which abated. ?-Clear liquid diet for now ?-Our biliary team to review her abdominal MRI/MRCP and RUQ sonogram results, to verify if EUS and ERCP to be completed ?-Await further recommendations per Dr. Candis Schatz ?-Direct bilirubin level added to a.m. lab draw ?-CBC, BMP, hepatic panel and INR in a.m. ?-Ondansetron 4 mg p.o. or IV every 6 hours as needed ?  ?2) History of SSS s/p pacemaker placement 06/2021 ?  ?3) History of GERD ?-Pantoprazole 40 mg p.o. daily ? ?4) AKI. Cr 1.10 -> 1.32 ?-IV fluids per the hospitalist ? ?5) Hypoalbuminemia> Albumin 2.9. ? ?Principal Problem: ?  Jaundice ? ? ? ? LOS: 2 days  ? ?Noralyn Pick  09/09/2021, 09:17AM ? ?I have taken a history, reviewed the chart and examined the patient. I performed a substantive portion of this encounter, including complete performance of at least one of the key components, in conjunction with the APP. I agree with the APP's note, impression and recommendations ? ?87 year old female with painless jaundice.  MRI limited by motion artifact, but notable for intra/extra hepatic ductal dilation, normal appearing pancreas without mass or PD dilation.  CA 19-9 elevated at 224.  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  Dr. Henrene Pastor has reviewed her case and is planning on an  ERCP with brushings/stent placement when he is available (currently with full schedule at [facility]).   ? ?No plans for ERCP today, but possibly tomorrow ?Regular diet this evening, then NPO after midnight for possible ERCP tomorrow ? ? ?Reneka Nebergall E. Candis Schatz, MD ?[facility] ? ? ?

## 2021-09-10 ENCOUNTER — Inpatient Hospital Stay (HOSPITAL_COMMUNITY): Payer: Medicare HMO

## 2021-09-10 ENCOUNTER — Inpatient Hospital Stay (HOSPITAL_COMMUNITY): Payer: Medicare HMO | Admitting: Anesthesiology

## 2021-09-10 ENCOUNTER — Encounter (HOSPITAL_COMMUNITY): Payer: Self-pay | Admitting: Internal Medicine

## 2021-09-10 ENCOUNTER — Encounter (HOSPITAL_COMMUNITY)
Admission: AD | Disposition: A | Discharge status: Home / Self Care | Payer: Self-pay | Source: Other Acute Inpatient Hospital | Attending: Internal Medicine

## 2021-09-10 DIAGNOSIS — R17 Unspecified jaundice: Secondary | ICD-10-CM

## 2021-09-10 DIAGNOSIS — K831 Obstruction of bile duct: Secondary | ICD-10-CM

## 2021-09-10 DIAGNOSIS — K838 Other specified diseases of biliary tract: Secondary | ICD-10-CM

## 2021-09-10 HISTORY — PX: ERCP: SHX5425

## 2021-09-10 HISTORY — PX: BILIARY STENT PLACEMENT: SHX5538

## 2021-09-10 HISTORY — PX: BILIARY BRUSHING: SHX6843

## 2021-09-10 HISTORY — PX: SPHINCTEROTOMY: SHX5544

## 2021-09-10 LAB — COMPREHENSIVE METABOLIC PANEL
ALT: 288 U/L — ABNORMAL HIGH (ref 0–44)
AST: 171 U/L — ABNORMAL HIGH (ref 15–41)
Albumin: 2.7 g/dL — ABNORMAL LOW (ref 3.5–5.0)
Alkaline Phosphatase: 204 U/L — ABNORMAL HIGH (ref 38–126)
Anion gap: 6 (ref 5–15)
BUN: 10 mg/dL (ref 8–23)
CO2: 23 mmol/L (ref 22–32)
Calcium: 9.2 mg/dL (ref 8.9–10.3)
Chloride: 109 mmol/L (ref 98–111)
Creatinine, Ser: 1.19 mg/dL — ABNORMAL HIGH (ref 0.44–1.00)
GFR, Estimated: 45 mL/min — ABNORMAL LOW (ref >60)
Glucose, Bld: 125 mg/dL — ABNORMAL HIGH (ref 70–99)
Potassium: 3.8 mmol/L (ref 3.5–5.1)
Sodium: 138 mmol/L (ref 135–145)
Total Bilirubin: 8.2 mg/dL — ABNORMAL HIGH (ref 0.3–1.2)
Total Protein: 5.4 g/dL — ABNORMAL LOW (ref 6.5–8.1)

## 2021-09-10 LAB — CBC
HCT: 30.9 % — ABNORMAL LOW (ref 36.0–46.0)
Hemoglobin: 10 g/dL — ABNORMAL LOW (ref 12.0–15.0)
MCH: 30.8 pg (ref 26.0–34.0)
MCHC: 32.4 g/dL (ref 30.0–36.0)
MCV: 95.1 fL (ref 80.0–100.0)
Platelets: 222 10*3/uL (ref 150–400)
RBC: 3.25 MIL/uL — ABNORMAL LOW (ref 3.87–5.11)
RDW: 15.4 % (ref 11.5–15.5)
WBC: 6.1 10*3/uL (ref 4.0–10.5)
nRBC: 0 % (ref 0.0–0.2)

## 2021-09-10 SURGERY — ERCP, WITH INTERVENTION IF INDICATED
Anesthesia: General

## 2021-09-10 MED ORDER — FENTANYL CITRATE (PF) 250 MCG/5ML IJ SOLN
INTRAMUSCULAR | Status: DC | PRN
  Administered 2021-09-10: 50 ug via INTRAVENOUS

## 2021-09-10 MED ORDER — PROPOFOL 10 MG/ML IV BOLUS
INTRAVENOUS | Status: DC | PRN
  Administered 2021-09-10: 100 mg via INTRAVENOUS

## 2021-09-10 MED ORDER — FENTANYL CITRATE (PF) 100 MCG/2ML IJ SOLN: 25.0000 ug | INTRAMUSCULAR | Status: DC | PRN

## 2021-09-10 MED ORDER — DEXAMETHASONE SODIUM PHOSPHATE 10 MG/ML IJ SOLN
INTRAMUSCULAR | Status: DC | PRN
  Administered 2021-09-10: 10 mg via INTRAVENOUS

## 2021-09-10 MED ORDER — SODIUM CHLORIDE 0.9 % IV SOLN: INTRAVENOUS | Status: DC

## 2021-09-10 MED ORDER — SUCCINYLCHOLINE CHLORIDE 200 MG/10ML IV SOSY
PREFILLED_SYRINGE | INTRAVENOUS | Status: DC | PRN
Start: 2021-09-10 — End: 2021-09-10
  Administered 2021-09-10: 120 mg via INTRAVENOUS

## 2021-09-10 MED ORDER — LIDOCAINE 2% (20 MG/ML) 5 ML SYRINGE
INTRAMUSCULAR | Status: DC | PRN
Start: 2021-09-10 — End: 2021-09-10
  Administered 2021-09-10: 100 mg via INTRAVENOUS

## 2021-09-10 MED ORDER — CIPROFLOXACIN IN D5W 400 MG/200ML IV SOLN
INTRAVENOUS | Status: DC | PRN
  Administered 2021-09-10: 400 mg via INTRAVENOUS

## 2021-09-10 MED ORDER — CIPROFLOXACIN IN D5W 400 MG/200ML IV SOLN
INTRAVENOUS | Status: AC
  Filled 2021-09-10: qty 200

## 2021-09-10 MED ORDER — PHENYLEPHRINE HCL-NACL 20-0.9 MG/250ML-% IV SOLN
INTRAVENOUS | Status: DC | PRN
  Administered 2021-09-10: 30 ug/min via INTRAVENOUS

## 2021-09-10 MED ORDER — SUGAMMADEX SODIUM 200 MG/2ML IV SOLN
INTRAVENOUS | Status: DC | PRN
  Administered 2021-09-10: 200 mg via INTRAVENOUS

## 2021-09-10 MED ORDER — ROCURONIUM BROMIDE 10 MG/ML (PF) SYRINGE
PREFILLED_SYRINGE | INTRAVENOUS | Status: DC | PRN
  Administered 2021-09-10: 20 mg via INTRAVENOUS

## 2021-09-10 MED ORDER — ONDANSETRON HCL 4 MG/2ML IJ SOLN: 4.0000 mg | Freq: Once | INTRAMUSCULAR | Status: DC | PRN

## 2021-09-10 MED ORDER — INDOMETHACIN 50 MG RE SUPP
100.0000 mg | Freq: Once | RECTAL | Status: AC
  Administered 2021-09-10: 100 mg via RECTAL
  Filled 2021-09-10 (×2): qty 2

## 2021-09-10 MED ORDER — ENOXAPARIN SODIUM 40 MG/0.4ML IJ SOSY: 40.0000 mg | PREFILLED_SYRINGE | INTRAMUSCULAR | Status: DC

## 2021-09-10 MED ORDER — SODIUM CHLORIDE 0.9 % IV SOLN
INTRAVENOUS | Status: DC | PRN
  Administered 2021-09-10: 40 mL

## 2021-09-10 MED ORDER — GADOBUTROL 1 MMOL/ML IV SOLN: 45.0000 mL | Freq: Once | INTRAVENOUS | Status: DC | PRN

## 2021-09-10 MED ORDER — ENOXAPARIN SODIUM 40 MG/0.4ML IJ SOSY
40.0000 mg | PREFILLED_SYRINGE | INTRAMUSCULAR | Status: DC
  Administered 2021-09-11: 40 mg via SUBCUTANEOUS
  Filled 2021-09-10: qty 0.4

## 2021-09-10 MED ORDER — ONDANSETRON HCL 4 MG/2ML IJ SOLN
INTRAMUSCULAR | Status: DC | PRN
  Administered 2021-09-10: 4 mg via INTRAVENOUS

## 2021-09-10 NOTE — Anesthesia Preprocedure Evaluation (Addendum)
Anesthesia Evaluation  ?Patient identified by MRN, date of birth, ID band ?Patient awake ? ? ? ?Reviewed: ?Allergy & Precautions, NPO status , Patient's Chart, lab work & pertinent test results, reviewed documented beta blocker date and time  ? ?History of Anesthesia Complications ?(+) PONV and history of anesthetic complications (has done well past few surgeries) ? ?Airway ?Mallampati: II ? ?TM Distance: >3 FB ?Neck ROM: Full ? ? ? Dental ? ?(+) Teeth Intact, Dental Advisory Given ?  ?Pulmonary ?neg pulmonary ROS,  ?  ?Pulmonary exam normal ?breath sounds clear to auscultation ? ? ? ? ? ? Cardiovascular ?hypertension (183/94 in preop, usually 130 SBP per pt ), Pt. on medications and Pt. on home beta blockers ?Normal cardiovascular exam ?Rhythm:Regular Rate:Normal ? ? ?  ?Neuro/Psych ?negative neurological ROS ? negative psych ROS  ? GI/Hepatic ?negative GI ROS, Jaundice, bile duct dilation  ?  ?Endo/Other  ?negative endocrine ROS ? Renal/GU ?Renal InsufficiencyRenal diseaseCr 1.19  ?negative genitourinary ?  ?Musculoskeletal ?negative musculoskeletal ROS ?(+)  ? Abdominal ?  ?Peds ? Hematology ? ?(+) Blood dyscrasia, anemia , Hb 10    ?Anesthesia Other Findings ? ? Reproductive/Obstetrics ?negative OB ROS ? ?  ? ? ? ? ? ? ? ? ? ? ? ? ? ?  ?  ? ? ? ? ? ? ? ?Anesthesia Physical ?Anesthesia Plan ? ?ASA: 2 ? ?Anesthesia Plan: General  ? ?Post-op Pain Management:   ? ?Induction: Intravenous ? ?PONV Risk Score and Plan: 3 and Ondansetron, Dexamethasone and Treatment may vary due to age or medical condition ? ?Airway Management Planned: Oral ETT ? ?Additional Equipment: None ? ?Intra-op Plan:  ? ?Post-operative Plan: Extubation in OR ? ?Informed Consent: I have reviewed the patients History and Physical, chart, labs and discussed the procedure including the risks, benefits and alternatives for the proposed anesthesia with the patient or authorized representative who has indicated his/her  understanding and acceptance.  ? ? ? ?Dental advisory given ? ?Plan Discussed with: CRNA ? ?Anesthesia Plan Comments:   ? ? ? ? ? ?Anesthesia Quick Evaluation ? ?

## 2021-09-10 NOTE — Anesthesia Procedure Notes (Signed)
Procedure Name: Intubation ?Date/Time: 09/10/2021 4:04 PM ?Performed by: Lorie Phenix, CRNA ?Pre-anesthesia Checklist: Patient identified, Emergency Drugs available, Suction available and Patient being monitored ?Patient Re-evaluated:Patient Re-evaluated prior to induction ?Oxygen Delivery Method: Circle system utilized ?Preoxygenation: Pre-oxygenation with 100% oxygen ?Induction Type: IV induction and Rapid sequence ?Ventilation: Mask ventilation without difficulty ?Laryngoscope Size: Mac and 3 ?Grade View: Grade I ?Tube type: Oral ?Number of attempts: 1 ?Airway Equipment and Method: Stylet ?Placement Confirmation: ETT inserted through vocal cords under direct vision, positive ETCO2 and breath sounds checked- equal and bilateral ?Secured at: 22 cm ?Tube secured with: Tape ?Dental Injury: Teeth and Oropharynx as per pre-operative assessment  ? ? ? ? ?

## 2021-09-10 NOTE — Progress Notes (Addendum)
? ? ? ?[facility] Progress Note ? ?CC:  Elevated LFTs, jaundice ? ?Subjective: She remains NPO for ERCP this afternoon. No CP or SOB. No N/V or abdominal pain. No BM today. Sister at the bedside.  ? ? ?Objective:  ?Vital signs in last 24 hours: ?Temp:  [97.5 ?F (36.4 ?C)-98.5 ?F (36.9 ?C)] 97.5 ?F (36.4 ?C) (04/12 0810) ?Pulse Rate:  [69-97] 69 (04/12 0810) ?Resp:  [18-19] 18 (04/12 0810) ?BP: (109-174)/(58-101) 150/72 (04/12 0810) ?Last BM Date : 09/08/21 ?General:   Alert very pleasant 87 year old female in no acute distress. ?Eyes: Scleral icterus present. ?Heart: Regular rate and rhythm, no murmurs. ?Pulm: Breath sounds clear throughout. ?Abdomen: Soft, nondistended.  Nontender.  Positive bowel sounds to all 4 quadrants. ?Extremities:  Without edema. ?Neurologic:  Alert and  oriented x 4. Grossly normal neurologically. ?Psych:  Alert and cooperative. Normal mood and affect. ?Skin: Jaundiced ? ?Intake/Output from previous day: ?04/11 0701 - 04/12 0700 ?In: 831.5 [P.O.:240; I.V.:591.5] ?Out: -  ?Intake/Output this shift: ?No intake/output data recorded. ? ?Lab Results: ?Recent Labs  ?  09/08/21 ?0037 09/09/21 ?3291 09/10/21 ?9166  ?WBC 5.7 5.6 6.1  ?HGB 10.4* 11.2* 10.0*  ?HCT 30.5* 33.0* 30.9*  ?PLT 227 237 222  ? ?BMET ?Recent Labs  ?  09/08/21 ?0037 09/09/21 ?0600 09/10/21 ?4599  ?NA 138 137 138  ?K 3.6 3.4* 3.8  ?CL 108 106 109  ?CO2 _0 ?GLUCOSE 94 126* 125*  ?BUN _1 ?CREATININE 1.10* 1.32* 1.19*  ?CALCIUM 9.0 9.5 9.2  ? ?LFT ?Recent Labs  ?  09/09/21 ?0218 09/10/21 ?7741  ?PROT 5.8* 5.4*  ?ALBUMIN 2.9* 2.7*  ?AST 204* 171*  ?ALT 320* 288*  ?ALKPHOS 223* 204*  ?BILITOT 8.4* 8.2*  ?BILIDIR 5.7*  --   ? ?PT/INR ?Recent Labs  ?  09/07/21 ?1725  ?LABPROT 13.7  ?INR 1.1  ? ?Hepatitis Panel ?Recent Labs  ?  09/09/21 ?1142  ?HEPBSAG NON REACTIVE  ?HCVAB NON REACTIVE  ?HEPAIGM NON REACTIVE  ?HEPBIGM NON REACTIVE  ? ? ?US Abdomen Limited RUQ (LIVER/GB) ? ?Result Date: 09/08/2021 ?CLINICAL  DATA:  Jaundice elevated LFT EXAM: ULTRASOUND ABDOMEN LIMITED RIGHT UPPER QUADRANT COMPARISON:  MRI 09/08/2021 FINDINGS: Gallbladder: No shadowing stones. Mild diffuse gallbladder wall thickening up to 3.7 mm. Negative sonographic Murphy. Common bile duct: Diameter: Dilated up to 9.7 mm. Liver: Coarse heterogeneous hepatic echotexture. Positive for intra hepatic biliary dilatation. Simple cyst in the left hepatic lobe measuring 14 mm, no follow-up imaging recommended. Portal vein is patent on color Doppler imaging with normal direction of blood flow towards the liver. Other: None. IMPRESSION: 1. Slightly distended gallbladder with diffuse wall thickening but no shadowing stone or sonographic Murphy. 2. Intra and extrahepatic biliary dilatation Electronically Signed   By: Donavan Foil M.D.   On: 09/08/2021 19:41   ? ?Assessment / Plan: ? ?2) 87 year old female with painless jaundice with elevated total bili, al phos, AST and ALT levels.  Total bili 6.6 -> 8.4 -> 8.2.  Alk phos 214 -> 223 -> 204.  AST 211 -> 204 -> 171.  ALT 293 -> 320 -> 288.  CA 19-9 224.  AFP 4.6.  She is afebrile. WBC 6.1. CTAP without contrast showed a mildly dilated gallbladder with hyperdense fluid suggestive of sludge without evidence of gallstones. RUQ sono identified a slightly distended gallbladder with diffuse wall thickening, CBD dilated to 9.7 mm, with intra and extrahepatic biliary ductal dilatation.  Abdominal MRI/MRCP 09/08/2021 identified intrahepatic  biliary ductal dilatation with gallbladder wall thickening, the CBD was not well visualized.  The study was significantly limited due to motion artifact. ERCP scheduled today at 3PM.  Hemodynamically stable.  She remains NPO since midnight. Lovenox dose held.  ?-Proceed with  ERCP with Dr. Henrene Pastor this afternoon as planned ?-CBC, BMP and hepatic panel in am ?  ?2) History of SSS s/p pacemaker placement 06/2021 ?  ?3) History of GERD ?-Pantoprazole 40 mg p.o. daily ?  ?4) AKI. Cr 1.10 ->  1.32 ?-IV fluids per the hospitalist ?  ?5) Hypoalbuminemia> Albumin 2.9. ?  ? ? ? ?Principal Problem: ?  Jaundice ? ? ? ? LOS: 3 days  ? ?Meagan [name]  09/10/2021, 2:06 PM ? ?GI ATTENDING ? ?History, laboratories, x-rays personally reviewed.  Patient seen and examined in the endoscopy suite.  She presents with obstructive jaundice.  Looks like distal stricture on MRCP.  Our advanced endoscopist unavailable for EUS.  I have discussed with primary inpatient GI team.  Biliary services requested for ERCP with probable stent placement.The nature of the procedure, as well as the risks, benefits, and alternatives were carefully and thoroughly reviewed with the patient. Ample time for discussion and questions allowed. The patient understood, was satisfied, and agreed to proceed. ? ?Docia Chuck. Geri Seminole., M.D. ?[place] ?Division of Gastroenterology  ? ?

## 2021-09-10 NOTE — Care Management Important Message (Signed)
Important Message ? ?Patient Details  ?Name: Meagan [name] ?MRN: [id number] ?Date of Birth: [birth year] ? ? ?Medicare Important Message Given:  Yes ? ? ? ? ?Meagan [name] ?09/10/2021, 3:27 PM ?

## 2021-09-10 NOTE — Progress Notes (Signed)
?PROGRESS NOTE ? ?Meagan [name]  PPJ:[id number] DOB: 11-30-1934 DOA: 09/07/2021 ?PCP: Abigail Miyamoto, [place]  ? ?Brief Narrative: ?Patient is a 87 year old female with history of hypertension, sick sinus syndrome status post pacemaker placement on August 2022, GERD who presented from home with complaints of jaundice.  She also complained of dark urine, pale stools.  On presentation, she was hypertensive.  Lab work showed elevated bilirubin, elevated liver enzymes.  CT abdomen/pelvis done at [facility] showed mildly dilated gallbladder, gallbladder sludge but no evidence of malignancy, normal morphology of liver.  MRCP done here showed Intrahepatic biliary ductal dilatation and apparent mild gallbladder wall thickening.  Lab work showed elevated CA 19-9.  GI following.  Plan for ERCP today ? ?Assessment & Plan: ? ?Principal Problem: ?  Jaundice ? ? ?Painless jaundice: Patient presented with dark urine, pale stools, icterus.  No report of abdominal pain. CT abdomen/pelvis done at [facility] showed mildly dilated gallbladder, gallbladder sludge but no evidence of malignancy, normal morphology of liver.  MRCP done here showed Intrahepatic biliary ductal dilatation and apparent mild gallbladder wall thickening.  Ultrasound of the right upper quadrant showed coarse heterogeneous hepatic echotexture, intra hepatic ?biliary dilatation, CBD of 9.7 mm. ?Lab work showed elevated CA 19-9.  Normal AFP.  GI following.  Plan for ERCP ? ?Elevated liver enzymes:  Could be secondary to biliary obstruction.  Negative hepatitis panel  ? ?Mild AKI: Started on gentle IV fluids.  Monitor BMP ? ?Hypokalemia: Supplemented with potassium, monitor ? ?Hypertension: Presented with hypertension.  Resumed home medications.  Monitor ? ?GERD: Continue PPI ? ?History of hyperlipidemia: Takes pravastatin which is on hold due to elevated liver enzymes ? ?  ? ? ?  ?  ? ?DVT prophylaxis:enoxaparin (LOVENOX) injection 30 mg Start: 09/07/21  2200 ? ? ?  Code Status: Full Code ? ?Family Communication: Daughter at the bedside on 4/12 ? ?Patient status:Inpatient ? ?Patient is from :home ? ?Anticipated discharge PY:KDXI ? ?Estimated DC date: After ERCP,GI clearance ? ? ?Consultants: GI ? ?Procedures: None ? ?Antimicrobials:  ?Anti-infectives (From admission, onward)  ? ? None  ? ?  ? ? ?Subjective: ?Patient seen and examined the bedside this morning.  Hemodynamically stable.  Denies any abdomen pain, nausea or vomiting today.  No new complaints ? ?Objective: ?Vitals:  ? 09/09/21 2002 09/09/21 2300 09/10/21 0300 09/10/21 0810  ?BP: (!) 131/101 (!) 109/58 110/60 (!) 150/72  ?Pulse:  73 97 69  ?Resp:    18  ?Temp: 98.5 ?F (36.9 ?C)  97.6 ?F (36.4 ?C) (!) 97.5 ?F (36.4 ?C)  ?TempSrc: Oral Oral Axillary Oral  ?SpO2:      ?Weight:      ?Height:      ? ? ?Intake/Output Summary (Last 24 hours) at 09/10/2021 1057 ?Last data filed at 09/09/2021 2146 ?Gross per 24 hour  ?Intake 831.51 ml  ?Output --  ?Net 831.51 ml  ? ? ?Filed Weights  ? 09/07/21 1923  ?Weight: 55.5 kg  ? ? ?Examination: ? ?General exam: Overall comfortable, not in distress, pleasant elderly female ?HEENT: PERRL ?Respiratory system:  no wheezes or crackles  ?Cardiovascular system: S1 & S2 heard, RRR.  ?Gastrointestinal system: Abdomen is nondistended, soft and nontender. ?Central nervous system: Alert and oriented ?Extremities: No edema, no clubbing ,no cyanosis ?Skin: Icterus ? ? ?Data Reviewed: I have personally reviewed following labs and imaging studies ? ?CBC: ?Recent Labs  ?Lab 09/08/21 ?0037 09/09/21 ?3382 09/10/21 ?5053  ?WBC 5.7 5.6 6.1  ?NEUTROABS  --  3.6  --   ?HGB 10.4* 11.2* 10.0*  ?HCT 30.5* 33.0* 30.9*  ?MCV 93.8 93.5 95.1  ?PLT 227 237 222  ? ?Basic Metabolic Panel: ?Recent Labs  ?Lab 09/07/21 ?1725 09/08/21 ?0037 09/09/21 ?1540 09/10/21 ?0867  ?NA 137 138 137 138  ?K 3.8 3.6 3.4* 3.8  ?CL 106 108 106 109  ?CO2 '25 24 24 23  '$ ?GLUCOSE 115* 94 126* 125*  ?BUN '10 9 14 10  '$ ?CREATININE 1.16*  1.10* 1.32* 1.19*  ?CALCIUM 9.4 9.0 9.5 9.2  ? ? ? ?No results found for this or any previous visit (from the past 240 hour(s)).  ? ?Radiology Studies: ?MR ABDOMEN MRCP W WO CONTAST ? ?Result Date: 09/08/2021 ?CLINICAL DATA:  Abnormal urine and stools. EXAM: MRI ABDOMEN WITHOUT AND WITH CONTRAST (INCLUDING MRCP) TECHNIQUE: Multiplanar multisequence MR imaging of the abdomen was performed both before and after the administration of intravenous contrast. Heavily T2-weighted images of the biliary and pancreatic ducts were obtained, and three-dimensional MRCP images were rendered by post processing. CONTRAST:  9m GADAVIST GADOBUTROL 1 MMOL/ML IV SOLN COMPARISON:  None. FINDINGS: Study is significantly limited due to motion. Lower chest: No acute findings. Hepatobiliary: Liver is normal in size and contour with no suspicious mass identified. 1.1 cm cyst in the left hepatic lobe. No evidence of hepatic steatosis. No cholelithiasis visualized. There appears to be mild gallbladder wall thickening. Mild intrahepatic biliary ductal dilatation. Common bile duct is not well visualized. Pancreas: No obvious pancreatic mass or ductal dilatation visualized. Spleen:  Within normal limits in size and appearance. Adrenals/Urinary Tract: Adrenal glands appear grossly normal. Symmetric perfusion of the kidneys. Exophytic cyst of the right kidney measuring 4.2 cm. No hydronephrosis or definite enhancing renal mass identified bilaterally. Stomach/Bowel: No evidence of bowel obstruction. Vascular/Lymphatic: No pathologically enlarged lymph nodes identified. No abdominal aortic aneurysm demonstrated. Other:  No ascites. Musculoskeletal: No suspicious bone lesions identified. IMPRESSION: 1. Study is significantly limited due to motion. 2. Intrahepatic biliary ductal dilatation and apparent mild gallbladder wall thickening. Common bile duct is not well visualized. No cholelithiasis visualized. Correlate clinically including with bilirubin  for evidence of obstruction and consider evaluation with CT to reduce motion, or HIDA scan. 3. Right renal cyst.  Left hepatic cyst. Electronically Signed   By: DOfilia NeasM.D.   On: 09/08/2021 16:18  ? ?UKoreaAbdomen Limited RUQ (LIVER/GB) ? ?Result Date: 09/08/2021 ?CLINICAL DATA:  Jaundice elevated LFT EXAM: ULTRASOUND ABDOMEN LIMITED RIGHT UPPER QUADRANT COMPARISON:  MRI 09/08/2021 FINDINGS: Gallbladder: No shadowing stones. Mild diffuse gallbladder wall thickening up to 3.7 mm. Negative sonographic Murphy. Common bile duct: Diameter: Dilated up to 9.7 mm. Liver: Coarse heterogeneous hepatic echotexture. Positive for intra hepatic biliary dilatation. Simple cyst in the left hepatic lobe measuring 14 mm, no follow-up imaging recommended. Portal vein is patent on color Doppler imaging with normal direction of blood flow towards the liver. Other: None. IMPRESSION: 1. Slightly distended gallbladder with diffuse wall thickening but no shadowing stone or sonographic Murphy. 2. Intra and extrahepatic biliary dilatation Electronically Signed   By: KDonavan FoilM.D.   On: 09/08/2021 19:41   ? ?Scheduled Meds: ? amLODipine  2.5 mg Oral QHS  ? aspirin EC  81 mg Oral QPC supper  ? enoxaparin (LOVENOX) injection  30 mg Subcutaneous Q24H  ? gabapentin  100 mg Oral BID  ? indomethacin  100 mg Rectal Once  ? nebivolol  2.5 mg Oral q AM  ? pantoprazole  40 mg Oral Daily  ? ?  Continuous Infusions: ? sodium chloride 75 mL/hr at 09/09/21 1951  ? sodium chloride 20 mL/hr at 09/10/21 0930  ? ? ? LOS: 3 days  ? ?Shelly Coss, MD ?[facility] ?P4/05/2022, 10:57 AM   ?

## 2021-09-10 NOTE — Op Note (Signed)
[facility] ?Patient Name: Meagan [name] ?Procedure Date : 09/10/2021 ?MRN: [id number] ?Attending MD: Docia Chuck. Henrene Pastor , MD ?Date of Birth: Aug 12, 1934 ?CSN: [id number] ?Age: 87 ?Admit Type: Inpatient ?Procedure:                ERCP with bile duct brushings and biliary stent  ?                          placement ?Indications:              Biliary dilation on Ultrasound ?Providers:                Docia Chuck. Henrene Pastor, MD, Jeanella Cara, RN,  ?                          Luan Moore, [employer] officer, Virgilio Belling. Beckner,  ?                          CRNA ?Referring MD:             [facility] ?Medicines:                General Anesthesia ?Complications:            No immediate complications. ?Estimated Blood Loss:     Estimated blood loss: none. ?Procedure:                Pre-Anesthesia Assessment: ?                          - Prior to the procedure, a History and Physical  ?                          was performed, and patient medications and  ?                          allergies were reviewed. The patient is competent.  ?                          The risks and benefits of the procedure and the  ?                          sedation options and risks were discussed with the  ?                          patient. All questions were answered and informed  ?                          consent was obtained. Patient identification and  ?                          proposed procedure were verified by the physician.  ?                          Mental Status Examination: alert and oriented.  ?                          Airway Examination:  normal oropharyngeal airway and  ?                          neck mobility. Respiratory Examination: clear to  ?                          auscultation. CV Examination: normal. Prophylactic  ?                          Antibiotics: The patient does not require  ?                          prophylactic antibiotics. Prior Anticoagulants: The  ?                          patient has taken  Lovenox (enoxaparin), last dose  ?                          was 1 day prior to procedure. ASA Grade Assessment:  ?                          II - A patient with mild systemic disease. After  ?                          reviewing the risks and benefits, the patient was  ?                          deemed in satisfactory condition to undergo the  ?                          procedure. The anesthesia plan was to use moderate  ?                          sedation / analgesia (conscious sedation).  ?                          Immediately prior to administration of medications,  ?                          the patient was re-assessed for adequacy to receive  ?                          sedatives. The heart rate, respiratory rate, oxygen  ?                          saturations, blood pressure, adequacy of pulmonary  ?                          ventilation, and response to care were monitored  ?                          throughout the procedure. The physical status of  ?  the patient was re-assessed after the procedure. ?                          After obtaining informed consent, the scope was  ?                          passed under direct vision. Throughout the  ?                          procedure, the patient's blood pressure, pulse, and  ?                          oxygen saturations were monitored continuously. The  ?                          TJF-Q190V ([id number]) Olympus duodenoscope was  ?                          introduced through the mouth, and used to inject  ?                          contrast into and used to inject contrast into the  ?                          bile duct. The ERCP was accomplished without  ?                          difficulty. The patient tolerated the procedure  ?                          well. ?Scope In: ?Scope Out: ?Findings: ?     1. ENDOSCOPIC FINDINGS: The esophagus was blindly intubated with the  ?     side-viewing endoscope and not visualized. The stomach and duodenum were   ?     grossly normal as was the major ampulla. The minor ampulla was not sought ?     2. X-ray FINDINGS ?     ??? A scout radiograph of the abdomen with the endoscope in position was  ?     unremarkable. ?     ??? Initial injection of contrast delineated a normal-appearing distal  ?     bile duct. Access to the bile duct was achieved with an .607 hydrophilic  ?     wire. Injection of contrast throughout the bile duct revealed a 1.5 cm  ?     mid common bile duct stricture. The bile duct below was normal. The bile  ?     duct above was dilated. Biliary brushings were obtained and submitted  ?     for cytopathologic analysis. ?     ??? A 10 French 7 cm in length plastic double flapped biliary stent was  ?     placed in good position with good drainage ?     ??? Post procedure radiograph of the stent in position was obtained. ?     -- There was no manipulation or injection of the pancreatic duct ?Impression:  1. Indeterminate mid common bile duct stricture as  ?                          described. Status post brushings. Status post stent  ?                          placement ?                          2. Follow-up cytology ?                          3. Trend liver tests ?                          4. Resume diet ?                          5. GI inpatient team will follow up on patient  ?                          tomorrow ?                          Findings discussed with the patient (sleepy).  ?                          Attempts were made to reach her daughter, Judson Roch  ?                          [place] Pax [phone]. ?Recommendation:           - Resume previous diet. ?Procedure Code(s):        --- Professional --- ?                          [phone], Endoscopic retrograde  ?                          cholangiopancreatography (ERCP); with placement of  ?                          endoscopic stent into biliary or pancreatic duct,  ?                          including pre- and post-dilation and guide wire  ?                           passage, when performed, including sphincterotomy,  ?                          when performed, each stent ?Diagnosis Code(s):        --- Professional --- ?                          K83.8, Other specified diseases of biliary tract ?CPT copyright 2019 American Medical Association. All rights reserved. ?The codes documented in this report are preliminary and  upon coder review may  ?be revised to meet current compliance requirements. ?Docia Chuck. Henrene Pastor, MD ?09/10/2021 5:20:08 PM ?This report has been signed electronically. ?Number of Addenda: 0 ?

## 2021-09-10 NOTE — Transfer of Care (Signed)
Immediate Anesthesia Transfer of Care Note ? ?Patient: Meagan [name] ? ?Procedure(s) Performed: ENDOSCOPIC RETROGRADE CHOLANGIOPANCREATOGRAPHY (ERCP) ?BILIARY BRUSHING ?SPHINCTEROTOMY ?BILIARY STENT PLACEMENT ? ?Patient Location: PACU ? ?Anesthesia Type:General ? ?Level of Consciousness: drowsy ? ?Airway & Oxygen Therapy: Patient Spontanous Breathing and Patient connected to face mask oxygen ? ?Post-op Assessment: Report given to RN and Post -op Vital signs reviewed and stable ? ?Post vital signs: Reviewed and stable ? ?Last Vitals:  ?Vitals Value Taken Time  ?BP 180/81 09/10/21 1715  ?Temp 36.6 ?C 09/10/21 1715  ?Pulse 60 09/10/21 1716  ?Resp 13 09/10/21 1716  ?SpO2 100 % 09/10/21 1716  ?Vitals shown include unvalidated device data. ? ?Last Pain:  ?Vitals:  ? 09/10/21 1432  ?TempSrc: Temporal  ?PainSc: 0-No pain  ?   ? ?  ? ?Complications: No notable events documented. ?

## 2021-09-11 ENCOUNTER — Other Ambulatory Visit: Payer: Self-pay

## 2021-09-11 ENCOUNTER — Telehealth: Payer: Self-pay | Admitting: Nurse Practitioner

## 2021-09-11 ENCOUNTER — Encounter (HOSPITAL_COMMUNITY): Payer: Self-pay | Admitting: Internal Medicine

## 2021-09-11 DIAGNOSIS — K831 Obstruction of bile duct: Secondary | ICD-10-CM

## 2021-09-11 DIAGNOSIS — R17 Unspecified jaundice: Secondary | ICD-10-CM | POA: Diagnosis not present

## 2021-09-11 LAB — COMPREHENSIVE METABOLIC PANEL
ALT: 260 U/L — ABNORMAL HIGH (ref 0–44)
AST: 144 U/L — ABNORMAL HIGH (ref 15–41)
Albumin: 2.6 g/dL — ABNORMAL LOW (ref 3.5–5.0)
Alkaline Phosphatase: 197 U/L — ABNORMAL HIGH (ref 38–126)
Anion gap: 6 (ref 5–15)
BUN: 12 mg/dL (ref 8–23)
CO2: 23 mmol/L (ref 22–32)
Calcium: 9.3 mg/dL (ref 8.9–10.3)
Chloride: 109 mmol/L (ref 98–111)
Creatinine, Ser: 1.13 mg/dL — ABNORMAL HIGH (ref 0.44–1.00)
GFR, Estimated: 48 mL/min — ABNORMAL LOW (ref >60)
Glucose, Bld: 203 mg/dL — ABNORMAL HIGH (ref 70–99)
Potassium: 4.3 mmol/L (ref 3.5–5.1)
Sodium: 138 mmol/L (ref 135–145)
Total Bilirubin: 6 mg/dL — ABNORMAL HIGH (ref 0.3–1.2)
Total Protein: 5.3 g/dL — ABNORMAL LOW (ref 6.5–8.1)

## 2021-09-11 LAB — CBC
HCT: 33.1 % — ABNORMAL LOW (ref 36.0–46.0)
Hemoglobin: 10.7 g/dL — ABNORMAL LOW (ref 12.0–15.0)
MCH: 30.8 pg (ref 26.0–34.0)
MCHC: 32.3 g/dL (ref 30.0–36.0)
MCV: 95.4 fL (ref 80.0–100.0)
Platelets: 233 10*3/uL (ref 150–400)
RBC: 3.47 MIL/uL — ABNORMAL LOW (ref 3.87–5.11)
RDW: 15.4 % (ref 11.5–15.5)
WBC: 5.3 10*3/uL (ref 4.0–10.5)
nRBC: 0 % (ref 0.0–0.2)

## 2021-09-11 LAB — CYTOLOGY - NON PAP

## 2021-09-11 MED ORDER — POLYETHYLENE GLYCOL 3350 17 G PO PACK: 17.0000 g | PACK | Freq: Every day | ORAL | 0 refills | Status: AC | PRN

## 2021-09-11 NOTE — Telephone Encounter (Signed)
Meagan [name], patient will be discharged from the hospital today.  Please send her to our lab in 1 week to have a CBC, BMP and hepatic panel.  Further follow-up will be determined after her ERCP biliary brushing cytology results received.  Thank you ?

## 2021-09-11 NOTE — Progress Notes (Signed)
Discharge instructions given, verbalized understanding.  Pt dicharged home in stable condition. ?

## 2021-09-11 NOTE — Telephone Encounter (Signed)
PATIENT HAS NOT YET ESTABLISHED WITH OFFICE. DPR not signed with [facility] ? ?Called pt to inform her about need for completion of labs 09/18/21. LVM asking for returned call. ?

## 2021-09-11 NOTE — Progress Notes (Signed)
CBC, BMP and hepatic panel ?

## 2021-09-11 NOTE — Discharge Summary (Signed)
Physician Discharge Summary  ?Meagan [name] HMC:[id number] DOB: 01-27-1935 DOA: 09/07/2021 ? ?PCP: Abigail Miyamoto, [place] ? ?Admit date: 09/07/2021 ?Discharge date: 09/11/2021 ? ?Admitted From: Home ?Disposition:  Home ? ?Discharge Condition:Stable ?CODE STATUS:FULL ?Diet recommendation: Heart Healthy  ? ?Brief/Interim Summary: ? ?Patient is a 87 year old female with history of hypertension, sick sinus syndrome status post pacemaker placement on August 2022, GERD who presented from home with complaints of jaundice.  She also complained of dark urine, pale stools.  On presentation, she was hypertensive.  Lab work showed elevated bilirubin, elevated liver enzymes.  CT abdomen/pelvis done at [facility] showed mildly dilated gallbladder, gallbladder sludge but no evidence of malignancy, normal morphology of liver.  MRCP done here showed Intrahepatic biliary ductal dilatation and apparent mild gallbladder wall thickening.  Lab work showed elevated CA 19-9.  Underwent ERCP.  She is medically stable for discharge to home today, GI cleared for discharge.  She will follow-up with gastroenterology as an outpatient. ? ?Following problems were addressed during hospitalization: ? ?Painless jaundice: Patient presented with dark urine, pale stools, icterus.  No report of abdominal pain. CT abdomen/pelvis done at [facility] showed mildly dilated gallbladder, gallbladder sludge but no evidence of malignancy, normal morphology of liver.  MRCP done here showed Intrahepatic biliary ductal dilatation and apparent mild gallbladder wall thickening.  Ultrasound of the right upper quadrant showed coarse heterogeneous hepatic echotexture, intra hepatic ?biliary dilatation, CBD of 9.7 mm. ?Lab work showed elevated CA 19-9.  Normal AFP.  GI were following.   ERCP showed indeterminate mild common bile duct stricture, status post stent placement, brushings obtained. ?Cytology will be followed by GI, she will follow-up with GI office in a week  to do CBC, BMP, hepatic function test ?  ?Elevated liver enzymes: Likely secondary to biliary obstruction.  Negative hepatitis panel  ?  ?Mild AKI: Resolved with IV fluids ?  ?Hypokalemia: Supplemented with potassium ?  ?Hypertension: Continue home medications ? ?GERD: Continue PPI ?  ?History of hyperlipidemia: Takes pravastatin which is on hold due to elevated liver enzymes ?  ? ? ? ?Discharge Diagnoses:  ?Principal Problem: ?  Jaundice ?Active Problems: ?  Bile duct stricture ?  Biliary obstruction ? ? ? ?Discharge Instructions ? ?Discharge Instructions   ? ? Diet - low sodium heart healthy   Complete by: As directed ?  ? Discharge instructions   Complete by: As directed ?  ? 1)Please follow-up with gastroenterology office in a week to do CBC, BMP, hepatic function panel tests ?2)You will be notified by gastroenterology about the results of your cytology report from ERCP  ? Increase activity slowly   Complete by: As directed ?  ? ?  ? ?Allergies as of 09/11/2021   ? ?   Reactions  ? Ivp Dye [iodinated Contrast Media] Hives  ? Lisinopril Cough  ? ?  ? ?  ?Medication List  ?  ? ?STOP taking these medications   ? ?pravastatin 40 MG tablet ?Commonly known as: PRAVACHOL ?  ? ?  ? ?TAKE these medications   ? ?amLODipine 2.5 MG tablet ?Commonly known as: NORVASC ?Take 2.5 mg by mouth at bedtime. ?  ?aspirin EC 81 MG tablet ?Take 81 mg by mouth daily after supper. Swallow whole. ?  ?Co Q-10 100 MG Caps ?Take 100 mg by mouth every evening. ?  ?gabapentin 100 MG capsule ?Commonly known as: NEURONTIN ?Take 100 mg by mouth 2 (two) times daily. ?  ?Joint Support Caps ?Take 1 capsule by  mouth daily with breakfast. ?  ?meclizine 25 MG tablet ?Commonly known as: ANTIVERT ?Take 12.5-25 mg by mouth daily as needed for dizziness. ?  ?multivitamin tablet ?Take 1 tablet by mouth daily with breakfast. ?  ?nebivolol 5 MG tablet ?Commonly known as: BYSTOLIC ?Take 2.5 mg by mouth in the morning. ?  ?[place] ?Take 1  capsule by mouth daily. ?  ?omeprazole 40 MG capsule ?Commonly known as: PRILOSEC ?Take 40 mg by mouth daily before breakfast. ?  ?polyethylene glycol 17 g packet ?Commonly known as: MiraLax ?Take 17 g by mouth daily as needed. ?  ?primidone 50 MG tablet ?Commonly known as: MYSOLINE ?Take 50 mg by mouth daily as needed (for tremors). ?  ?pyridOXINE 50 MG tablet ?Commonly known as: VITAMIN B-6 ?Take 50 mg by mouth daily. ?  ?tiZANidine 2 MG tablet ?Commonly known as: ZANAFLEX ?Take 1 mg by mouth daily as needed for muscle spasms. ?  ?Vitamin D3 50 MCG (2000 UT) Tabs ?Take 2,000 Units by mouth daily after supper. ?  ? ?  ? ? Follow-up Information   ? ? Daryel November, MD. Schedule an appointment as soon as possible for a visit in 1 week(s).   ?Specialty: Gastroenterology ?Contact information: ?[address] ?[place] [address] ?[phone] ? ? ?  ?  ? ?  ?  ? ?  ? ?Allergies  ?Allergen Reactions  ? Ivp Dye [Iodinated Contrast Media] Hives  ? Lisinopril Cough  ? ? ?Consultations: ?GI ? ? ?Procedures/Studies: ?DG Chest 1 View ? ?Result Date: 09/08/2021 ?CLINICAL DATA:  Pacemaker placement. EXAM: CHEST  1 VIEW COMPARISON:  None. FINDINGS: The heart size and mediastinal contours are within normal limits. Both lungs are clear. Left-sided pacemaker is noted with leads in grossly good position. No pneumothorax or pleural effusion is noted. The visualized skeletal structures are unremarkable. IMPRESSION: Left-sided pacemaker is noted with leads in grossly good position. Electronically Signed   By: Marijo Conception M.D.   On: 09/08/2021 10:11  ? ?DG ERCP ? ?Result Date: 09/11/2021 ?CLINICAL DATA:  Jaundice Elevated liver function tests EXAM: ERCP TECHNIQUE: Multiple spot images obtained with the fluoroscopic device and submitted for interpretation post-procedure. FLUOROSCOPY: Radiation Exposure Index (as provided by the fluoroscopic device): 49 mGy Kerma COMPARISON:  Ultrasound abdomen limited 09/08/2021 FINDINGS:  Thirteen intraoperative fluoroscopic images were provided for interpretation. The submitted images demonstrate access of the intra and extrahepatic bile ducts with opacification. Although no filling defects are seen within the common bile duct, the distal segment is obscured by overlying endoscope. There is free flow of contrast into the duodenum. Final images demonstrate common bile duct stent in place. IMPRESSION: ERCP images as above. These images were submitted for radiologic interpretation only. Please see the procedural report for the amount of contrast and the fluoroscopy time utilized. Electronically Signed   By: Miachel Roux M.D.   On: 09/11/2021 07:50  ? ?MR ABDOMEN MRCP W WO CONTAST ? ?Result Date: 09/08/2021 ?CLINICAL DATA:  Abnormal urine and stools. EXAM: MRI ABDOMEN WITHOUT AND WITH CONTRAST (INCLUDING MRCP) TECHNIQUE: Multiplanar multisequence MR imaging of the abdomen was performed both before and after the administration of intravenous contrast. Heavily T2-weighted images of the biliary and pancreatic ducts were obtained, and three-dimensional MRCP images were rendered by post processing. CONTRAST:  82m GADAVIST GADOBUTROL 1 MMOL/ML IV SOLN COMPARISON:  None. FINDINGS: Study is significantly limited due to motion. Lower chest: No acute findings. Hepatobiliary: Liver is normal in size  and contour with no suspicious mass identified. 1.1 cm cyst in the left hepatic lobe. No evidence of hepatic steatosis. No cholelithiasis visualized. There appears to be mild gallbladder wall thickening. Mild intrahepatic biliary ductal dilatation. Common bile duct is not well visualized. Pancreas: No obvious pancreatic mass or ductal dilatation visualized. Spleen:  Within normal limits in size and appearance. Adrenals/Urinary Tract: Adrenal glands appear grossly normal. Symmetric perfusion of the kidneys. Exophytic cyst of the right kidney measuring 4.2 cm. No hydronephrosis or definite enhancing renal mass identified  bilaterally. Stomach/Bowel: No evidence of bowel obstruction. Vascular/Lymphatic: No pathologically enlarged lymph nodes identified. No abdominal aortic aneurysm demonstrated. Other:  No ascites. [place]

## 2021-09-11 NOTE — Progress Notes (Addendum)
? ? ? ?[name] Progress Note ? ?CC:  Painless jaundice  ? ?Subjective: She fells well after ERCP yesterday.  She is eating a regular diet.  Urine is lighter. No BM for the past few days. No CP or SOB. No N/V or abdominal pain. She is wanting to go home. ? ?Objective:  ?Vital signs in last 24 hours: ?Temp:  [97.5 ?F (36.4 ?C)-98.1 ?F (36.7 ?C)] 98 ?F (36.7 ?C) (04/13 8182) ?Pulse Rate:  [58-69] 60 (04/13 0835) ?Resp:  [10-19] 18 (04/13 0835) ?BP: (119-183)/(53-94) 148/73 (04/13 0835) ?SpO2:  [97 %-100 %] 97 % (04/13 0354) ?Weight:  [55.8 kg] 55.8 kg (04/12 1432) ?Last BM Date : 09/10/21 ?General: Alert 87 year old female in no acute distress ?Heart: Regular rate and rhythm ?Pulm: No murmur. ?Abdomen: Soft, nontender.  Positive bowel sounds to all 4 quadrants ?Extremities:  Without edema. ?Neurologic:  Alert and  oriented x 4. Grossly normal neurologically. ?Psych:  Alert and cooperative. Normal mood and affect. ?Skin: Jaundice. ? ?Intake/Output from previous day: ?04/12 0701 - 04/13 0700 ?In: 500 [I.V.:500] ?Out: -  ?Intake/Output this shift: ?No intake/output data recorded. ? ?Lab Results: ?Recent Labs  ?  09/09/21 ?0218 09/10/21 ?9937 09/11/21 ?0139  ?WBC 5.6 6.1 5.3  ?HGB 11.2* 10.0* 10.7*  ?HCT 33.0* 30.9* 33.1*  ?PLT 237 222 233  ? ?BMET ?Recent Labs  ?  09/09/21 ?0218 09/10/21 ?1696 09/11/21 ?0139  ?NA 137 138 138  ?K 3.4* 3.8 4.3  ?CL 106 109 109  ?CO2 '24 23 23  '$ ?GLUCOSE 126* 125* 203*  ?BUN '14 10 12  '$ ?CREATININE 1.32* 1.19* 1.13*  ?CALCIUM 9.5 9.2 9.3  ? ?LFT ?Recent Labs  ?  09/09/21 ?0218 09/10/21 ?7893 09/11/21 ?0139  ?PROT 5.8*   < > 5.3*  ?ALBUMIN 2.9*   < > 2.6*  ?AST 204*   < > 144*  ?ALT 320*   < > 260*  ?ALKPHOS 223*   < > 197*  ?BILITOT 8.4*   < > 6.0*  ?BILIDIR 5.7*  --   --   ? < > = values in this interval not displayed.  ? ?PT/INR ?No results for input(s): LABPROT, INR in the last 72 hours. ?Hepatitis Panel ?Recent Labs  ?  09/09/21 ?1142  ?HEPBSAG NON REACTIVE  ?HCVAB NON  REACTIVE  ?HEPAIGM NON REACTIVE  ?HEPBIGM NON REACTIVE  ? ? ?DG ERCP ? ?Result Date: 09/11/2021 ?CLINICAL DATA:  Jaundice Elevated liver function tests EXAM: ERCP TECHNIQUE: Multiple spot images obtained with the fluoroscopic device and submitted for interpretation post-procedure. FLUOROSCOPY: Radiation Exposure Index (as provided by the fluoroscopic device): 49 mGy Kerma COMPARISON:  Ultrasound abdomen limited 09/08/2021 FINDINGS: Thirteen intraoperative fluoroscopic images were provided for interpretation. The submitted images demonstrate access of the intra and extrahepatic bile ducts with opacification. Although no filling defects are seen within the common bile duct, the distal segment is obscured by overlying endoscope. There is free flow of contrast into the duodenum. Final images demonstrate common bile duct stent in place. IMPRESSION: ERCP images as above. These images were submitted for radiologic interpretation only. Please see the procedural report for the amount of contrast and the fluoroscopy time utilized. Electronically Signed   By: Miachel Roux M.D.   On: 09/11/2021 07:50   ? ?Assessment / Plan: ? ?20) 87 year old female with painless jaundice and elevated LFTs. CTAP without contrast showed a mildly dilated gallbladder with hyperdense fluid suggestive of sludge without evidence of gallstones. RUQ sono identified a slightly distended gallbladder with  diffuse wall thickening, CBD dilated to 9.7 mm with intra/extrahepatic biliary ductal dilatation. Abdominal MRI/MRCP 09/08/2021 identified intrahepatic biliary ductal dilatation with gallbladder wall thickening, the CBD was not well visualized. S/P ERCP 09/10/2021 identified an indeterminate mid common bile duct stricture with stent placement, brushings obtained.  LFTs drifting downward post ERCP.  She is tolerating a regular diet.  Hemodynamically stable.  ?-Our office will contact patient with brushing cytology report and for follow-up ?-CBC, hepatic panel   and BMP in 1 week ?-Ok to discharge home from GI standpoint ? ?2) Constipation  ?-MiraLAX nightly. ?-Patient elects to drink prune juice when discharged home ? ? ?  ? ?Principal Problem: ?  Jaundice ?Active Problems: ?  Bile duct stricture ?  Biliary obstruction ? ? ? ? LOS: 4 days  ? ?Meagan [name]  09/11/2021, 11:44 AM ? ? ?I reviewed the patient's chart today, but not did see the patient.  I agree with the APP's note, impression and recommendations ? ?Meagan Mourer E. Candis Schatz, MD ?[facility] ? ?

## 2021-09-12 NOTE — Telephone Encounter (Signed)
SECOND ATTEMPT: ? ?Called pt and advised to ensure she completes labs on 09/18/21. Provided with location of office and lab. Also advised of f/u appt with Dr. Henrene Pastor on 09/26/21. Verbalized acceptance and understanding. Reminder created to ensure labs have been completed in a timely manner. ?

## 2021-09-12 NOTE — Anesthesia Postprocedure Evaluation (Signed)
Anesthesia Post Note ? ?Patient: Meagan [name] ? ?Procedure(s) Performed: ENDOSCOPIC RETROGRADE CHOLANGIOPANCREATOGRAPHY (ERCP) ?BILIARY BRUSHING ?SPHINCTEROTOMY ?BILIARY STENT PLACEMENT ? ?  ? ?Patient location during evaluation: PACU ?Anesthesia Type: General ?Level of consciousness: awake ?Pain management: pain level controlled ?Respiratory status: spontaneous breathing ?Cardiovascular status: stable ?Postop Assessment: no apparent nausea or vomiting ?Anesthetic complications: no ? ? ?No notable events documented. ? ?Last Vitals:  ?Vitals:  ? 09/11/21 0835 09/11/21 1232  ?BP: (!) 148/73 137/66  ?Pulse: 60 72  ?Resp: 18 19  ?Temp: 36.7 ?C 36.4 ?C  ?SpO2:    ?  ?Last Pain:  ?Vitals:  ? 09/11/21 1232  ?TempSrc: Oral  ?PainSc:   ? ? ?  ?  ?  ?  ?  ?  ? ?Virlan Kempker ? ? ? ? ?

## 2021-09-18 ENCOUNTER — Other Ambulatory Visit (INDEPENDENT_AMBULATORY_CARE_PROVIDER_SITE_OTHER): Payer: Medicare HMO

## 2021-09-18 ENCOUNTER — Telehealth: Payer: Self-pay | Admitting: Internal Medicine

## 2021-09-18 DIAGNOSIS — R17 Unspecified jaundice: Secondary | ICD-10-CM | POA: Diagnosis not present

## 2021-09-18 DIAGNOSIS — K831 Obstruction of bile duct: Secondary | ICD-10-CM | POA: Diagnosis not present

## 2021-09-18 LAB — CBC
HCT: 33.6 % — ABNORMAL LOW (ref 36.0–46.0)
Hemoglobin: 11.1 g/dL — ABNORMAL LOW (ref 12.0–15.0)
MCHC: 33 g/dL (ref 30.0–36.0)
MCV: 94.6 fl (ref 78.0–100.0)
Platelets: 301 10*3/uL (ref 150.0–400.0)
RBC: 3.55 Mil/uL — ABNORMAL LOW (ref 3.87–5.11)
RDW: 15.6 % — ABNORMAL HIGH (ref 11.5–15.5)
WBC: 6.9 10*3/uL (ref 4.0–10.5)

## 2021-09-18 LAB — BASIC METABOLIC PANEL
BUN: 16 mg/dL (ref 6–23)
CO2: 28 mEq/L (ref 19–32)
Calcium: 9.4 mg/dL (ref 8.4–10.5)
Chloride: 101 mEq/L (ref 96–112)
Creatinine, Ser: 1.13 mg/dL (ref 0.40–1.20)
GFR: 44.54 mL/min — ABNORMAL LOW (ref >60.00)
Glucose, Bld: 137 mg/dL — ABNORMAL HIGH (ref 70–99)
Potassium: 3.7 mEq/L (ref 3.5–5.1)
Sodium: 135 mEq/L (ref 135–145)

## 2021-09-18 LAB — HEPATIC FUNCTION PANEL
ALT: 287 U/L — ABNORMAL HIGH (ref 0–35)
AST: 205 U/L — ABNORMAL HIGH (ref 0–37)
Albumin: 3.7 g/dL (ref 3.5–5.2)
Alkaline Phosphatase: 368 U/L — ABNORMAL HIGH (ref 39–117)
Bilirubin, Direct: 3.4 mg/dL — ABNORMAL HIGH (ref 0.0–0.3)
Total Bilirubin: 5.5 mg/dL — ABNORMAL HIGH (ref 0.2–1.2)
Total Protein: 6.6 g/dL (ref 6.0–8.3)

## 2021-09-18 NOTE — Telephone Encounter (Signed)
Returned pt call. Advised it is recommended that she have her labs completed within a [facility] facility to ensure we have ease of access to her results for fast, efficient care. However, we understand if she is feeling too fatigued to travel, she may consider a location closer to her but it will be her responsibility to ensure our providers receive results in a timely manner. Pt states she will not be driving, but rather has a somebody able to transport her to have labs completed at our office. Understands the difficulties she may encounter with ensuring that the outside facility forwards results to our office for proper care. Therefore, pt states she would prefer to have labs completed at our office to quickly and adequately address any concerns that may arise from her results. No further action required as she states she intends to arrive at our lab ~ 130pm today. ?

## 2021-09-18 NOTE — Telephone Encounter (Signed)
Labs completed today. Awaiting review and recommendations from [facility] [place]. ?

## 2021-09-18 NOTE — Telephone Encounter (Signed)
Patient called, states she has labs ordered. Per patient, would like to know if the location can be changed to a facility closer to her. Patient states, she's feeling weak and very tired and doesn't feel like driving to [place]. Please advise. ?

## 2021-09-19 ENCOUNTER — Other Ambulatory Visit: Payer: Self-pay | Admitting: Nurse Practitioner

## 2021-09-19 DIAGNOSIS — R7989 Other specified abnormal findings of blood chemistry: Secondary | ICD-10-CM

## 2021-09-19 DIAGNOSIS — K831 Obstruction of bile duct: Secondary | ICD-10-CM

## 2021-09-22 ENCOUNTER — Telehealth: Payer: Self-pay | Admitting: Nurse Practitioner

## 2021-09-22 NOTE — Telephone Encounter (Signed)
Review of pt chart indicates pt dtr was returning [facility] [place] call as below: ? ?Contacted the patient at this time and informed the patient that an abdominal x-ray was needed to check biliary stent placement and to have a repeat LFTs done next week.  I entered orders for the hepatic panel and abdominal x-ray.  Advised the patient to get the x-ray done and labs on Tuesday 4/25 so the results are available for Dr. Henrene Pastor at the time of her follow-up appoint with him on Friday 4/28. ? ?Returned dtr's call. Informed of Carl Best, CRNPs message above. Verbalized acceptance and understanding. ?

## 2021-09-22 NOTE — Telephone Encounter (Signed)
Inbound call from patients daughter stating that she is returning a missed call she had a few days ago. Please advise.  ?

## 2021-09-23 ENCOUNTER — Other Ambulatory Visit (INDEPENDENT_AMBULATORY_CARE_PROVIDER_SITE_OTHER): Payer: Medicare HMO

## 2021-09-23 ENCOUNTER — Ambulatory Visit (INDEPENDENT_AMBULATORY_CARE_PROVIDER_SITE_OTHER)
Admission: RE | Admit: 2021-09-23 | Discharge: 2021-09-23 | Disposition: A | Discharge status: Home / Self Care | Payer: Medicare HMO | Source: Ambulatory Visit | Attending: Nurse Practitioner | Admitting: Nurse Practitioner

## 2021-09-23 DIAGNOSIS — K831 Obstruction of bile duct: Secondary | ICD-10-CM | POA: Diagnosis not present

## 2021-09-23 DIAGNOSIS — R7989 Other specified abnormal findings of blood chemistry: Secondary | ICD-10-CM | POA: Diagnosis not present

## 2021-09-23 LAB — HEPATIC FUNCTION PANEL
ALT: 165 U/L — ABNORMAL HIGH (ref 0–35)
AST: 79 U/L — ABNORMAL HIGH (ref 0–37)
Albumin: 3.6 g/dL (ref 3.5–5.2)
Alkaline Phosphatase: 462 U/L — ABNORMAL HIGH (ref 39–117)
Bilirubin, Direct: 2 mg/dL — ABNORMAL HIGH (ref 0.0–0.3)
Total Bilirubin: 3.7 mg/dL — ABNORMAL HIGH (ref 0.2–1.2)
Total Protein: 6.7 g/dL (ref 6.0–8.3)

## 2021-09-24 NOTE — Telephone Encounter (Signed)
Labs and imaging completed and resulted. Appears results have been reviewed by Carl Best, CRNP and discussed with Dr. Henrene Pastor. No further action at this time. ?

## 2021-09-26 ENCOUNTER — Encounter: Payer: Self-pay | Admitting: Internal Medicine

## 2021-09-26 ENCOUNTER — Other Ambulatory Visit (INDEPENDENT_AMBULATORY_CARE_PROVIDER_SITE_OTHER): Payer: Medicare HMO

## 2021-09-26 ENCOUNTER — Ambulatory Visit: Payer: Medicare HMO | Admitting: Internal Medicine

## 2021-09-26 VITALS — BP 118/78 | HR 79 | Ht 67.0 in | Wt 116.0 lb

## 2021-09-26 DIAGNOSIS — K831 Obstruction of bile duct: Secondary | ICD-10-CM

## 2021-09-26 DIAGNOSIS — R17 Unspecified jaundice: Secondary | ICD-10-CM

## 2021-09-26 DIAGNOSIS — R7989 Other specified abnormal findings of blood chemistry: Secondary | ICD-10-CM | POA: Diagnosis not present

## 2021-09-26 LAB — HEPATIC FUNCTION PANEL
ALT: 147 U/L — ABNORMAL HIGH (ref 0–35)
AST: 102 U/L — ABNORMAL HIGH (ref 0–37)
Albumin: 3.4 g/dL — ABNORMAL LOW (ref 3.5–5.2)
Alkaline Phosphatase: 427 U/L — ABNORMAL HIGH (ref 39–117)
Bilirubin, Direct: 3.3 mg/dL — ABNORMAL HIGH (ref 0.0–0.3)
Total Bilirubin: 5.2 mg/dL — ABNORMAL HIGH (ref 0.2–1.2)
Total Protein: 6.5 g/dL (ref 6.0–8.3)

## 2021-09-26 NOTE — Progress Notes (Signed)
HISTORY OF PRESENT ILLNESS: ? ?Meagan [name] is a 87 y.o. female from [place] who was admitted to [facility] September 06, 2021 with painless jaundice after being sent by her primary care physician.  She did report some weight loss.  Initial liver test revealed AST 216, ALT 310, alkaline phosphatase 249, and total bilirubin 6.8.  Bilirubin was as high as 8.4 on September 09, 2021.  Abdominal ultrasound revealed intra and extrahepatic ductal dilation.  MRI was limited due to motion.  However it demonstrated intrahepatic ductal dilation and mild gallbladder wall thickening.  CA 19-9 was elevated at 224.  Patient subsequently underwent ERCP on September 10, 2021.  She was found to have an indeterminate mid common bile duct stricture with upstream dilation of the bile duct measuring 11 mm.  Brushings of the stricture were obtained and returned negative for malignancy.  A 10 French 7 cm plastic stent was placed into the bile duct.  Her liver tests have been trended since.  Last liver tests 3 days ago revealed AST 79, ALT 165, total bilirubin 3.7.  These are all improved.  However, alkaline phosphatase was elevated at 462.  We did obtain plain films of the abdomen.  The biliary stent appears to be in the proper position.  Patient presents today to the office for scheduled follow-up.  She is accompanied by her Gardner.  Patient looks well and is delightful.  She tells me that she has significant fatigue.  No other particular complaints.  No focal or significant abdominal discomfort.  No fevers.  Her urine and stool colors have been improving but not normalized. ? ?REVIEW OF SYSTEMS: ? ?All non-GI ROS negative.  Only fatigue as noted in the HPI ? ?Past Medical History:  ?Diagnosis Date  ? PONV (postoperative nausea and vomiting)   ? ? ?Past Surgical History:  ?Procedure Laterality Date  ? BILIARY BRUSHING  09/10/2021  ? Procedure: BILIARY BRUSHING;  Surgeon: Irene Shipper, MD;  Location: [facility] ENDOSCOPY;   Service: Gastroenterology;;  ? BILIARY STENT PLACEMENT  09/10/2021  ? Procedure: BILIARY STENT PLACEMENT;  Surgeon: Irene Shipper, MD;  Location: [facility] ENDOSCOPY;  Service: Gastroenterology;;  ? ERCP N/A 09/10/2021  ? Procedure: ENDOSCOPIC RETROGRADE CHOLANGIOPANCREATOGRAPHY (ERCP);  Surgeon: Irene Shipper, MD;  Location: [facility];  Service: Gastroenterology;  Laterality: N/A;  ? SPHINCTEROTOMY  09/10/2021  ? Procedure: SPHINCTEROTOMY;  Surgeon: Irene Shipper, MD;  Location: [facility] ENDOSCOPY;  Service: Gastroenterology;;  ? ? ?Social History ?Meagan [name]  reports that she has never smoked. She has never used smokeless tobacco. She reports that she does not drink alcohol and does not use drugs. ? ?family history is not on file. ? ?Allergies  ?Allergen Reactions  ? Ivp Dye [Iodinated Contrast Media] Hives  ? Lisinopril Cough  ? ? ?  ? ?PHYSICAL EXAMINATION: ?Vital signs: BP 118/78   Pulse 79   Ht 5' 7" (1.702 m)   Wt 116 lb (52.6 kg)   SpO2 99%   BMI 18.17 kg/m?   ?Constitutional: Very pleasant and generally well-appearing, no acute distress ?Psychiatric: alert and oriented x3, cooperative ?Eyes: extraocular movements intact, anicteric, conjunctiva pink ?Mouth: oral pharynx moist, no lesions ?Neck: supple no lymphadenopathy ?Cardiovascular: heart regular rate and rhythm, no murmur ?Lungs: clear to auscultation bilaterally ?Abdomen: soft, nontender, nondistended, no obvious ascites, no peritoneal signs, normal bowel sounds, no organomegaly ?Rectal: Omitted ?Extremities: no clubbing, cyanosis, or lower extremity edema bilaterally ?Skin: no lesions on visible extremities ?Neuro: No  focal deficits.  Cranial nerves intact ? ?ASSESSMENT: ? ?1.  Indeterminate mid common duct biliary stricture with upstream dilation in a patient with painless jaundice with weight loss.  Mild pruritus.  Elevated CA 19-9.  Status post plastic biliary stent placement.  Negative brushings.  Improving liver tests, but slowly.  Primary concern  is bile duct cancer. ?2.  Otherwise healthy ? ? ?PLAN: ? ?1.  Lengthy and detailed discussion, including diagrams, regarding the patient's problem, anatomy, recent therapeutic intervention, blood work interpretation, clinical impression/concerns, and plans.  Patient's stepdaughter is a Marine scientist.  Multiple excellent questions asked by both the patient and her stepdaughter.  These were answered to their satisfaction. ?2.  Follow-up liver tests today ?3.  They were given specific parameters given that might suggest stent occlusion or stent migration (pain, fever, progressive jaundice).  They were told to contact this office or present to the emergency room ASAP for any of these conditions or concerns. ?4.  I have consulted with my partner, Dr. Rush Landmark, who is somewhat familiar with this case.  At this point the next step would be endoscopic ultrasound.  May need spyglass.  Would anticipate stent exchange at that time.  I will forward this note to him regarding scheduling.  I explained to the patient and her stepdaughter the nature of these procedures.  I explained that Dr. Donneta Romberg nurse would be in touch with them regarding scheduling after he has reviewed the case. ?A total time of 60 minutes was spent preparing to see the patient, reviewing a myriad of test, x-rays, procedures, and pathology.  Obtaining interval history and performing medically appropriate physical examination.  Counseling the patient and her stepdaughter as well as educating them regarding her biliary issues.  As well, ordering endoscopic ultrasound/ERCP, conferring with my partner, and documenting clinical information in the health record. ? ? ? ?  ?

## 2021-09-26 NOTE — Patient Instructions (Signed)
If you are age 85 or older, your body mass index should be between 23-30. Your Body mass index is 18.17 kg/m?Marland Kitchen If this is out of the aforementioned range listed, please consider follow up with your Primary Care Provider. ? ?If you are age 54 or younger, your body mass index should be between 19-25. Your Body mass index is 18.17 kg/m?Marland Kitchen If this is out of the aformentioned range listed, please consider follow up with your Primary Care Provider.  ? ?________________________________________________________ ? ?The [facility] providers would like to encourage you to use [facility] to communicate with providers for non-urgent requests or questions.  Due to long hold times on the telephone, sending your provider a message by [facility] may be a faster and more efficient way to get a response.  Please allow 48 business hours for a response.  Please remember that this is for non-urgent requests.  ?_______________________________________________________ ? ?Your provider has requested that you go to the basement level for lab work before leaving today. Press "B" on the elevator. The lab is located at the first door on the left as you exit the elevator. ? ?Dr. Henrene Pastor will start the process of scheduling and EUS and we will call you ?

## 2021-09-29 ENCOUNTER — Other Ambulatory Visit: Payer: Self-pay

## 2021-09-29 ENCOUNTER — Telehealth: Payer: Self-pay

## 2021-09-29 DIAGNOSIS — R7989 Other specified abnormal findings of blood chemistry: Secondary | ICD-10-CM

## 2021-09-29 DIAGNOSIS — K831 Obstruction of bile duct: Secondary | ICD-10-CM

## 2021-09-29 NOTE — Telephone Encounter (Signed)
-----   Message from Irving Copas., MD sent at 09/28/2021 10:28 PM EDT ----- ?Regarding: RE: ?JP, ?With upcoming time away as well as memorial day, we are constrained for sure.  As you may or may not know, most my patients wait 8 to 12 weeks for my advanced procedures.  Not ideal, but always try to accommodate what and when we can.  Certainly understand the gravity of potential underlying malignancy being missed.  We will see what is possible. ? ?Khrystyne Arpin, ?Please move my last patient on 5/31 to my next available time slot as a result of needing to try to accommodate this more urgent EUS/ERCP with spyglass. ?Please apologize to that patient on 5/31 and make them the next available on my openings. ?Thanks. ?GM ?----- Message ----- ?From: Irene Shipper, MD ?Sent: 09/28/2021   9:17 AM EDT ?To: Irving Copas., MD ?Subject: RE:                                           ? ?I don't imagine that the stricture is benign. ?I may need to refer her out if it's going to be 3 months.  ?I understand the constraints. Let me know. ?Thanks for reviewing. ?JP ? ?----- Message ----- ?From: Mansouraty, Telford Nab., MD ?Sent: 09/26/2021   5:22 PM EDT ?To: Irene Shipper, MD ?Subject: RE:                                           ? ?JP, ?Happy to be available.  I will not have time until end of July at this point based on my schedule being full.  DJ is similar.   ?If you repeat her Ca19-9 and it is elevating or remaining similar after biliary obstruction has been stented, then we will see if something can be done sooner if we get a cancellation. ?I?ll have Naylin Burkle work on scheduling next week. ?GM ?----- Message ----- ?From: Irene Shipper, MD ?Sent: 09/26/2021   4:27 PM EDT ?To: Irving Copas., MD ? ?Gabe, ?You are familiar with this patient with indeterminate mid common duct stricture.  See my note.  I believe that she needs EUS/ERCP with possible spyglass.  I explained this to the patient and her stepdaughter in detail.   They know to await a call from your office regarding scheduling. ?Thanks, ?JP ? ? ?

## 2021-09-29 NOTE — Telephone Encounter (Signed)
EUS,ERCP,Spyglass scheduled, pt instructed and medications reviewed.  Patient instructions mailed to home.  Patient to call with any questions or concerns. ? ?

## 2021-09-29 NOTE — Telephone Encounter (Signed)
Eus ERCP Spyglass has been scheduled for 10/29/21 at 10 am at [facility]  ? ?Left message on machine to call back  ?

## 2021-10-02 ENCOUNTER — Other Ambulatory Visit: Payer: Self-pay

## 2021-10-02 ENCOUNTER — Other Ambulatory Visit (INDEPENDENT_AMBULATORY_CARE_PROVIDER_SITE_OTHER): Payer: Medicare HMO

## 2021-10-02 DIAGNOSIS — R7989 Other specified abnormal findings of blood chemistry: Secondary | ICD-10-CM

## 2021-10-02 LAB — HEPATIC FUNCTION PANEL
ALT: 182 U/L — ABNORMAL HIGH (ref 0–35)
AST: 154 U/L — ABNORMAL HIGH (ref 0–37)
Albumin: 3.3 g/dL — ABNORMAL LOW (ref 3.5–5.2)
Alkaline Phosphatase: 528 U/L — ABNORMAL HIGH (ref 39–117)
Bilirubin, Direct: 9 mg/dL — ABNORMAL HIGH (ref 0.0–0.3)
Total Bilirubin: 13.5 mg/dL — ABNORMAL HIGH (ref 0.2–1.2)
Total Protein: 6.5 g/dL (ref 6.0–8.3)

## 2021-10-02 MED ORDER — CIPROFLOXACIN HCL 500 MG PO TABS: 500.0000 mg | ORAL_TABLET | Freq: Two times a day (BID) | ORAL | 0 refills | Status: DC

## 2021-10-03 ENCOUNTER — Ambulatory Visit (HOSPITAL_COMMUNITY): Admit: 2021-10-03 | Payer: Medicare HMO | Admitting: Internal Medicine

## 2021-10-03 ENCOUNTER — Encounter (HOSPITAL_COMMUNITY)
Admission: EM | Disposition: A | Discharge status: Home / Self Care | Payer: Self-pay | Source: Home / Self Care | Attending: Emergency Medicine

## 2021-10-03 ENCOUNTER — Other Ambulatory Visit: Payer: Self-pay

## 2021-10-03 ENCOUNTER — Emergency Department (HOSPITAL_COMMUNITY): Payer: Medicare HMO

## 2021-10-03 ENCOUNTER — Observation Stay (HOSPITAL_COMMUNITY): Payer: Medicare HMO | Admitting: Anesthesiology

## 2021-10-03 ENCOUNTER — Observation Stay (HOSPITAL_COMMUNITY)
Admission: EM | Admit: 2021-10-03 | Discharge: 2021-10-04 | Disposition: A | Discharge status: Home / Self Care | Payer: Medicare HMO | Attending: Internal Medicine | Admitting: Internal Medicine

## 2021-10-03 ENCOUNTER — Telehealth: Payer: Self-pay | Admitting: Gastroenterology

## 2021-10-03 ENCOUNTER — Encounter (HOSPITAL_COMMUNITY): Payer: Self-pay

## 2021-10-03 ENCOUNTER — Observation Stay (HOSPITAL_COMMUNITY): Payer: Medicare HMO

## 2021-10-03 ENCOUNTER — Observation Stay (HOSPITAL_BASED_OUTPATIENT_CLINIC_OR_DEPARTMENT_OTHER): Payer: Medicare HMO | Admitting: Anesthesiology

## 2021-10-03 DIAGNOSIS — E782 Mixed hyperlipidemia: Secondary | ICD-10-CM | POA: Diagnosis present

## 2021-10-03 DIAGNOSIS — Z79899 Other long term (current) drug therapy: Secondary | ICD-10-CM | POA: Insufficient documentation

## 2021-10-03 DIAGNOSIS — K219 Gastro-esophageal reflux disease without esophagitis: Secondary | ICD-10-CM | POA: Diagnosis present

## 2021-10-03 DIAGNOSIS — Z7982 Long term (current) use of aspirin: Secondary | ICD-10-CM | POA: Diagnosis not present

## 2021-10-03 DIAGNOSIS — D72829 Elevated white blood cell count, unspecified: Secondary | ICD-10-CM | POA: Diagnosis not present

## 2021-10-03 DIAGNOSIS — K838 Other specified diseases of biliary tract: Secondary | ICD-10-CM | POA: Diagnosis not present

## 2021-10-03 DIAGNOSIS — K831 Obstruction of bile duct: Secondary | ICD-10-CM | POA: Diagnosis not present

## 2021-10-03 DIAGNOSIS — Z950 Presence of cardiac pacemaker: Secondary | ICD-10-CM | POA: Insufficient documentation

## 2021-10-03 DIAGNOSIS — R17 Unspecified jaundice: Secondary | ICD-10-CM

## 2021-10-03 DIAGNOSIS — Z4659 Encounter for fitting and adjustment of other gastrointestinal appliance and device: Secondary | ICD-10-CM

## 2021-10-03 DIAGNOSIS — I10 Essential (primary) hypertension: Secondary | ICD-10-CM | POA: Diagnosis present

## 2021-10-03 DIAGNOSIS — T85590A Other mechanical complication of bile duct prosthesis, initial encounter: Secondary | ICD-10-CM | POA: Diagnosis not present

## 2021-10-03 HISTORY — DX: Dizziness and giddiness: R42

## 2021-10-03 HISTORY — PX: ERCP: SHX5425

## 2021-10-03 HISTORY — DX: Essential tremor: G25.0

## 2021-10-03 HISTORY — DX: Essential (primary) hypertension: I10

## 2021-10-03 HISTORY — PX: STENT REMOVAL: SHX6421

## 2021-10-03 HISTORY — DX: Gastro-esophageal reflux disease without esophagitis: K21.9

## 2021-10-03 HISTORY — PX: BILIARY STENT PLACEMENT: SHX5538

## 2021-10-03 HISTORY — DX: Pure hypercholesterolemia, unspecified: E78.00

## 2021-10-03 LAB — COMPREHENSIVE METABOLIC PANEL
ALT: 187 U/L — ABNORMAL HIGH (ref 0–44)
AST: 164 U/L — ABNORMAL HIGH (ref 15–41)
Albumin: 2.6 g/dL — ABNORMAL LOW (ref 3.5–5.0)
Alkaline Phosphatase: 514 U/L — ABNORMAL HIGH (ref 38–126)
Anion gap: 11 (ref 5–15)
BUN: 22 mg/dL (ref 8–23)
CO2: 23 mmol/L (ref 22–32)
Calcium: 9.6 mg/dL (ref 8.9–10.3)
Chloride: 99 mmol/L (ref 98–111)
Creatinine, Ser: 0.98 mg/dL (ref 0.44–1.00)
GFR, Estimated: 57 mL/min — ABNORMAL LOW (ref >60)
Glucose, Bld: 116 mg/dL — ABNORMAL HIGH (ref 70–99)
Potassium: 3.9 mmol/L (ref 3.5–5.1)
Sodium: 133 mmol/L — ABNORMAL LOW (ref 135–145)
Total Bilirubin: 16.3 mg/dL — ABNORMAL HIGH (ref 0.3–1.2)
Total Protein: 6.7 g/dL (ref 6.5–8.1)

## 2021-10-03 LAB — AMMONIA: Ammonia: 36 umol/L — ABNORMAL HIGH (ref 9–35)

## 2021-10-03 LAB — CBC WITH DIFFERENTIAL/PLATELET
Abs Immature Granulocytes: 0.33 10*3/uL — ABNORMAL HIGH (ref 0.00–0.07)
Basophils Absolute: 0.1 10*3/uL (ref 0.0–0.1)
Basophils Relative: 0 %
Eosinophils Absolute: 0 10*3/uL (ref 0.0–0.5)
Eosinophils Relative: 0 %
HCT: 34.1 % — ABNORMAL LOW (ref 36.0–46.0)
Hemoglobin: 11.6 g/dL — ABNORMAL LOW (ref 12.0–15.0)
Immature Granulocytes: 2 %
Lymphocytes Relative: 5 %
Lymphs Abs: 1.1 10*3/uL (ref 0.7–4.0)
MCH: 31.1 pg (ref 26.0–34.0)
MCHC: 34 g/dL (ref 30.0–36.0)
MCV: 91.4 fL (ref 80.0–100.0)
Monocytes Absolute: 1 10*3/uL (ref 0.1–1.0)
Monocytes Relative: 5 %
Neutro Abs: 17.8 10*3/uL — ABNORMAL HIGH (ref 1.7–7.7)
Neutrophils Relative %: 88 %
Platelets: 418 10*3/uL — ABNORMAL HIGH (ref 150–400)
RBC: 3.73 MIL/uL — ABNORMAL LOW (ref 3.87–5.11)
RDW: 16.7 % — ABNORMAL HIGH (ref 11.5–15.5)
WBC: 20.3 10*3/uL — ABNORMAL HIGH (ref 4.0–10.5)
nRBC: 0 % (ref 0.0–0.2)

## 2021-10-03 LAB — LIPASE, BLOOD: Lipase: 49 U/L (ref 11–51)

## 2021-10-03 LAB — PROTIME-INR
INR: 1 (ref 0.8–1.2)
Prothrombin Time: 13.4 seconds (ref 11.4–15.2)

## 2021-10-03 SURGERY — ERCP, WITH INTERVENTION IF INDICATED
Anesthesia: General

## 2021-10-03 MED ORDER — SUGAMMADEX SODIUM 500 MG/5ML IV SOLN
INTRAVENOUS | Status: DC | PRN
  Administered 2021-10-03: 150 mg via INTRAVENOUS

## 2021-10-03 MED ORDER — PANTOPRAZOLE SODIUM 40 MG IV SOLR
40.0000 mg | INTRAVENOUS | Status: DC
  Administered 2021-10-03: 40 mg via INTRAVENOUS
  Filled 2021-10-03: qty 10

## 2021-10-03 MED ORDER — ROCURONIUM BROMIDE 100 MG/10ML IV SOLN
INTRAVENOUS | Status: DC | PRN
  Administered 2021-10-03: 40 mg via INTRAVENOUS

## 2021-10-03 MED ORDER — FENTANYL CITRATE (PF) 100 MCG/2ML IJ SOLN
INTRAMUSCULAR | Status: AC
  Filled 2021-10-03: qty 2

## 2021-10-03 MED ORDER — ONDANSETRON HCL 4 MG PO TABS
4.0000 mg | ORAL_TABLET | Freq: Four times a day (QID) | ORAL | Status: DC | PRN
Start: 2021-10-03 — End: 2021-10-04

## 2021-10-03 MED ORDER — PROPOFOL 10 MG/ML IV BOLUS
INTRAVENOUS | Status: DC | PRN
  Administered 2021-10-03: 80 mg via INTRAVENOUS

## 2021-10-03 MED ORDER — DICLOFENAC SUPPOSITORY 100 MG
RECTAL | Status: DC | PRN
  Administered 2021-10-03: 100 mg via RECTAL

## 2021-10-03 MED ORDER — FENTANYL CITRATE (PF) 100 MCG/2ML IJ SOLN
INTRAMUSCULAR | Status: DC | PRN
  Administered 2021-10-03 (×2): 50 ug via INTRAVENOUS

## 2021-10-03 MED ORDER — DICLOFENAC SUPPOSITORY 100 MG
100.0000 mg | Freq: Once | RECTAL | Status: DC
  Filled 2021-10-03: qty 1

## 2021-10-03 MED ORDER — OXYCODONE HCL 5 MG/5ML PO SOLN: 5.0000 mg | Freq: Once | ORAL | Status: DC | PRN

## 2021-10-03 MED ORDER — FENTANYL CITRATE (PF) 100 MCG/2ML IJ SOLN: 25.0000 ug | INTRAMUSCULAR | Status: DC | PRN

## 2021-10-03 MED ORDER — LIDOCAINE HCL (CARDIAC) PF 100 MG/5ML IV SOSY
PREFILLED_SYRINGE | INTRAVENOUS | Status: DC | PRN
Start: 2021-10-03 — End: 2021-10-03
  Administered 2021-10-03: 40 mg via INTRAVENOUS

## 2021-10-03 MED ORDER — DIPHENHYDRAMINE HCL 50 MG/ML IJ SOLN
INTRAMUSCULAR | Status: AC
Start: 2021-10-03 — End: ?
  Filled 2021-10-03: qty 1

## 2021-10-03 MED ORDER — BOOST / RESOURCE BREEZE PO LIQD CUSTOM
1.0000 | Freq: Three times a day (TID) | ORAL | Status: DC
Start: 2021-10-03 — End: 2021-10-04
  Administered 2021-10-03 – 2021-10-04 (×3): 1 via ORAL

## 2021-10-03 MED ORDER — OXYCODONE HCL 5 MG PO TABS: 5.0000 mg | ORAL_TABLET | Freq: Once | ORAL | Status: DC | PRN

## 2021-10-03 MED ORDER — LACTATED RINGERS IV SOLN: INTRAVENOUS | Status: DC | PRN

## 2021-10-03 MED ORDER — ENSURE ENLIVE PO LIQD
237.0000 mL | Freq: Two times a day (BID) | ORAL | Status: DC
  Administered 2021-10-04: 237 mL via ORAL

## 2021-10-03 MED ORDER — ONDANSETRON HCL 4 MG/2ML IJ SOLN: 4.0000 mg | Freq: Once | INTRAMUSCULAR | Status: DC | PRN

## 2021-10-03 MED ORDER — ONDANSETRON HCL 4 MG/2ML IJ SOLN
INTRAMUSCULAR | Status: DC | PRN
  Administered 2021-10-03: 4 mg via INTRAVENOUS

## 2021-10-03 MED ORDER — PHENYLEPHRINE HCL (PRESSORS) 10 MG/ML IV SOLN
INTRAVENOUS | Status: DC | PRN
  Administered 2021-10-03: 100 ug via INTRAVENOUS
  Administered 2021-10-03 (×2): 80 ug via INTRAVENOUS
  Administered 2021-10-03: 160 ug via INTRAVENOUS
  Administered 2021-10-03: 80 ug via INTRAVENOUS

## 2021-10-03 MED ORDER — SODIUM CHLORIDE 0.9 % IV SOLN
1.5000 g | Freq: Once | INTRAVENOUS | Status: AC
  Administered 2021-10-03: 1.5 g via INTRAVENOUS
  Filled 2021-10-03: qty 1.5

## 2021-10-03 MED ORDER — DIPHENHYDRAMINE HCL 50 MG/ML IJ SOLN
25.0000 mg | Freq: Once | INTRAMUSCULAR | Status: AC
  Administered 2021-10-03: 25 mg via INTRAVENOUS

## 2021-10-03 MED ORDER — ONDANSETRON HCL 4 MG/2ML IJ SOLN: 4.0000 mg | Freq: Four times a day (QID) | INTRAMUSCULAR | Status: DC | PRN

## 2021-10-03 MED ORDER — PROMETHAZINE HCL 25 MG PO TABS: 25.0000 mg | ORAL_TABLET | ORAL | Status: DC | PRN

## 2021-10-03 MED ORDER — SODIUM CHLORIDE 0.9 % IV SOLN: INTRAVENOUS | Status: DC

## 2021-10-03 MED ORDER — GLUCAGON HCL RDNA (DIAGNOSTIC) 1 MG IJ SOLR
INTRAMUSCULAR | Status: AC
  Filled 2021-10-03: qty 1

## 2021-10-03 MED ORDER — SODIUM CHLORIDE (PF) 0.9 % IJ SOLN
INTRAMUSCULAR | Status: DC | PRN
  Administered 2021-10-03: 10 mL

## 2021-10-03 MED ORDER — DICLOFENAC SUPPOSITORY 100 MG
RECTAL | Status: AC
Start: 2021-10-03 — End: ?
  Filled 2021-10-03: qty 1

## 2021-10-03 NOTE — Telephone Encounter (Signed)
Patient daughter called requesting to speak with nurse regarding procedure scheduled 10/29/21 at [facility]. Please advise. ?

## 2021-10-03 NOTE — Transfer of Care (Signed)
Immediate Anesthesia Transfer of Care Note ? ?Patient: Meagan [name] ? ?Procedure(s) Performed: ENDOSCOPIC RETROGRADE CHOLANGIOPANCREATOGRAPHY (ERCP) ?STENT REMOVAL ?BILIARY STENT PLACEMENT ? ?Patient Location: PACU ? ?Anesthesia Type:General ? ?Level of Consciousness: awake, alert  and oriented ? ?Airway & Oxygen Therapy: Patient Spontanous Breathing and Patient connected to face mask oxygen ? ?Post-op Assessment: Report given to RN and Post -op Vital signs reviewed and stable ? ?Post vital signs: Reviewed and stable ? ?Last Vitals:  ?Vitals Value Taken Time  ?BP 138/59 10/03/21 1706  ?Temp    ?Pulse 82 10/03/21 1709  ?Resp 19 10/03/21 1709  ?SpO2 100 % 10/03/21 1709  ?Vitals shown include unvalidated device data. ? ?Last Pain:  ?Vitals:  ? 10/03/21 1540  ?TempSrc: Oral  ?PainSc: 0-No pain  ?   ? ?  ? ?Complications: No notable events documented. ?

## 2021-10-03 NOTE — Brief Op Note (Signed)
10/03/2021 ? ?7:10 PM ? ?PATIENT:  Meagan [name]  87 y.o. female ? ?PRE-OPERATIVE DIAGNOSIS:  Bile duct stricture, malfunctioning stent, jaundice ? ?POST-OPERATIVE DIAGNOSIS:  Same ? ?PROCEDURE:  Procedure(s): ?ENDOSCOPIC RETROGRADE CHOLANGIOPANCREATOGRAPHY (ERCP) (N/A) ?STENT REMOVAL ?BILIARY STENT PLACEMENT (N/A) ? ?SURGEON:  Surgeon(s) and Role: ?   Gatha Mayer, MD - Primary ? ?Full note was created but has not shown up in [facility] ? ?ERCP today w/ successful replacement of bile duct stent - stent had migrated some and may have been clogged. ? ?New 7 cm 10 Fr stent placed with excellent drainage of bile. ? ?If she is ok and bili decreasing in AM then home and we will arrange f/u ? ?

## 2021-10-03 NOTE — H&P (Signed)
?History and Physical  ? ? ?Patient: Meagan [name] MRN:[id number] DOB: 06/27/1934 ?DOA: 10/03/2021 ?DOS: the patient was seen and examined on 10/03/2021 ?PCP: Hagan, Elizabeth, [name]  ?Patient coming from: Home ? ?Chief Complaint:  ?Chief Complaint  ?Patient presents with  ? Jaundice  ? ?HPI: Meagan [name] is a 86 y.o. female with medical history significant of essential tremor, GERD, hyperlipidemia, hypertension, vertigo who is coming to the emergency department due to developing jaundice again after she had hyperbilirubinemia around Easter and had a stent placed on 09/10/2021.  Since then, she has been having intermittent jaundice and pruritus of the palms/plantar areas.  Her stools have been lighter.  Gastroenterology evaluated her today.  She underwent ERCP with stent removal and new biliary stent placement.  GI would like to observe her overnight.  She has been nauseous on location.  She denied fever, chills, headache, dyspnea, chest pain, diaphoresis, palpitations, PND or lower extremity edema.  No flank pain, dysuria, frequency or hematuria. ? ?ED course: Initial vital signs were temperature 98.5 ?F, pulse 64, respirations 16, BP 173/75 mmHg and O2 sat 95% on room air.  The patient received diphenhydramine 25 mg IVP and 1.5 g of Unasyn IVPB. ? ?Lab work: CBC showed a white count of 20.3 with 88% neutrophils, hemoglobin 11.6 g/dL platelets 418.  PT, INR were unremarkable.  Lipase was normal.  Ammonia mildly elevated at 36 ?mol/L.lab17 CMP showed a sodium 133 mmol/L, but all other electrolytes and renal functions were normal.  Glucose 116 and total bilirubin 16.3 mg/dL.  Albumin is 2.6 g/dL.  Transaminases and alk phosphatase are mildly elevated. ? ?Imaging: RUQ US showed mild intrahepatic biliary ductal dilatation. ? ?Review of Systems: As mentioned in the history of present illness. All other systems reviewed and are negative. ?Past Medical History:  ?Diagnosis Date  ? Essential tremor   ? GERD  (gastroesophageal reflux disease)   ? High cholesterol   ? Hypertension   ? PONV (postoperative nausea and vomiting)   ? Vertigo   ? ?Past Surgical History:  ?Procedure Laterality Date  ? BILIARY BRUSHING  09/10/2021  ? Procedure: BILIARY BRUSHING;  Surgeon: Perry, John N, MD;  Location: [facility];  Service: Gastroenterology;;  ? BILIARY STENT PLACEMENT  09/10/2021  ? Procedure: BILIARY STENT PLACEMENT;  Surgeon: Perry, John N, MD;  Location: [facility];  Service: Gastroenterology;;  ? ERCP N/A 09/10/2021  ? Procedure: ENDOSCOPIC RETROGRADE CHOLANGIOPANCREATOGRAPHY (ERCP);  Surgeon: Perry, John N, MD;  Location: [facility];  Service: Gastroenterology;  Laterality: N/A;  ? PACEMAKER IMPLANT    ? SPHINCTEROTOMY  09/10/2021  ? Procedure: SPHINCTEROTOMY;  Surgeon: Perry, John N, MD;  Location: [facility];  Service: Gastroenterology;;  ? ?Social History:  reports that she has never smoked. She has never used smokeless tobacco. She reports that she does not drink alcohol and does not use drugs. ? ?Allergies  ?Allergen Reactions  ? Ivp Dye [Iodinated Contrast Media] Hives  ? Lisinopril Cough  ? ? ?Family History  ?Problem Relation Age of Onset  ? Stomach cancer Neg Hx   ? Esophageal cancer Neg Hx   ? Colon cancer Neg Hx   ? Pancreatic cancer Neg Hx   ? ? ?Prior to Admission medications   ?Medication Sig Start Date End Date Taking? Authorizing Provider  ?amLODipine (NORVASC) 2.5 MG tablet Take 2.5 mg by mouth at bedtime.    [provider]  ?aspirin EC 81 MG tablet Take 81 mg by mouth daily after supper. Swallow whole.      [provider]  ?Cholecalciferol (VITAMIN D3) 50 MCG (2000 UT) TABS Take 2,000 Units by mouth daily after supper.    [provider]  ?ciprofloxacin (CIPRO) 500 MG tablet Take 1 tablet (500 mg total) by mouth 2 (two) times daily. 10/02/21   Perry, John N, MD  ?Coenzyme Q10 (CO Q-10) 100 MG CAPS Take 100 mg by mouth every evening.    [provider]  ?gabapentin  (NEURONTIN) 100 MG capsule Take 100 mg by mouth 2 (two) times daily.    [provider]  ?meclizine (ANTIVERT) 25 MG tablet Take 12.5-25 mg by mouth daily as needed for dizziness.    [provider]  ?Misc Natural Products (JOINT SUPPORT) CAPS Take 1 capsule by mouth daily with breakfast.    [provider]  ?Multiple Vitamin (MULTIVITAMIN) tablet Take 1 tablet by mouth daily with breakfast.    [provider]  ?Multiple Vitamins-Minerals (OCUVITE EYE HEALTH FORMULA) CAPS Take 1 capsule by mouth daily.    [provider]  ?nebivolol (BYSTOLIC) 5 MG tablet Take 2.5 mg by mouth in the morning.    [provider]  ?omeprazole (PRILOSEC) 40 MG capsule Take 40 mg by mouth daily before breakfast.    [provider]  ?polyethylene glycol (MIRALAX) 17 g packet Take 17 g by mouth daily as needed. 09/11/21   Adhikari, Amrit, MD  ?primidone (MYSOLINE) 50 MG tablet Take 50 mg by mouth daily as needed (for tremors).    [provider]  ?pyridOXINE (VITAMIN B-6) 50 MG tablet Take 50 mg by mouth daily.    [provider]  ?tiZANidine (ZANAFLEX) 2 MG tablet Take 1 mg by mouth daily as needed for muscle spasms.    [provider]  ? ? ?Physical Exam: ?Vitals:  ? 10/03/21 1540 10/03/21 1706 10/03/21 1715 10/03/21 1730  ?BP: (!) 162/59 (!) 138/59 (!) 133/57 133/63  ?Pulse: 86 84 77 76  ?Resp: 17 19 15 19  ?Temp: 98.5 ?F (36.9 ?C) 99.9 ?F (37.7 ?C)  100 ?F (37.8 ?C)  ?TempSrc: Oral     ?SpO2: 99% 100% 98% 98%  ?Weight: 53.1 kg     ?Height: 5' 7" (1.702 m)     ? ?Physical Exam ?Vitals and nursing note reviewed.  ?Constitutional:   ?   General: She is awake.  ?   Appearance: She is underweight. She is ill-appearing.  ?HENT:  ?   Head: Normocephalic.  ?   Mouth/Throat:  ?   Mouth: Mucous membranes are dry.  ?Eyes:  ?   General: No scleral icterus. ?   Pupils: Pupils are equal, round, and reactive to light.  ?Neck:  ?   Vascular: No JVD.   ?Cardiovascular:  ?   Rate and Rhythm: Normal rate and regular rhythm.  ?   Heart sounds: S1 normal and S2 normal.  ?Pulmonary:  ?   Effort: Pulmonary effort is normal.  ?   Breath sounds: Normal breath sounds. No wheezing, rhonchi or rales.  ?Abdominal:  ?   General: There is no distension.  ?   Palpations: Abdomen is soft.  ?   Tenderness: There is no abdominal tenderness. There is no right CVA tenderness, left CVA tenderness or guarding.  ?Musculoskeletal:  ?   Right lower leg: No edema.  ?   Left lower leg: No edema.  ?Skin: ?   General: Skin is warm and dry.  ?Neurological:  ?   General: No focal deficit present.  ?     Mental Status: She is alert and oriented to person, place, and time.  ?Psychiatric:     ?   Mood and Affect: Mood normal.     ?   Behavior: Behavior normal.  ? ? ?Data Reviewed: ? ?There are no new results to review at this time. ? ?Assessment and Plan: ?Principal Problem: ?  Hyperbilirubinemia ?  Bile duct obstruction ?Status post ERCP. ?Observation/telemetry. ?Continue analgesics per GI. ?Antiemetics as needed. ?Follow-up LFTs in AM. ?GI consult/procedure appreciated. ? ?Active Problems: ?  Leukocytosis ?No fever or obvious signs of infection. ?Continue to monitor clinically. ?Follow-up WBC in the morning. ? ?  Essential hypertension ?Normotensive at the moment. ?Monitor blood pressure. ?As needed antihypertensives. ? ?  Gastroesophageal reflux disease ?Pantoprazole 40 mg IVP every 24 hours. ? ?  Mixed hyperlipidemia ?Treatment deferred in the setting of abnormal LFTs. ? ? ? ? Advance Care Planning:   Code Status: Full Code  ? ?Consults: [place] [facility] Silvano Rusk, MD). ? ?Family Communication:  ? ?Severity of Illness: ?The appropriate patient status for this patient is OBSERVATION. Observation status is judged to be reasonable and necessary in order to provide the required intensity of service to ensure the patient's safety. The patient's presenting symptoms, physical exam  findings, and initial radiographic and laboratory data in the context of their medical condition is felt to place them at decreased risk for further clinical deterioration. Furthermore, it is anticipated that the patient will be medically

## 2021-10-03 NOTE — ED Provider Notes (Signed)
?[facility] DEPT ?Provider Note ? ? ?CSN: [id number] ?Arrival date & time: 10/03/21  1239 ? ?  ? ?History ? ?Chief Complaint  ?Patient presents with  ? Jaundice  ? ? ?Meagan [name] is a 87 y.o. female. ? ?HPI ?Patient presenting for evaluation of climbing bilirubin, found on laboratory testing done yesterday by her GI provider.  Recently she has been started on Cipro for possible right upper quadrant infection, ondansetron for nausea, and famotidine for gastritis. ?Patient was apparently directed here after discussion with the GI service by telephone yesterday.  Patient is not having active vomiting, fever, chills, cough, shortness of breath, focal weakness or paresthesia.  She feels generally fatigued ? ?Recent GI evaluation on 09/26/2021: To follow-up on painless jaundice diagnosed about a month ago.  She has had an ERCP.  She has an indeterminate mid common bile duct stricture.  Brushings were negative for malignancy.  She has a biliary stent in place.  She has an elevated CA 19-9.  Findings are suspicious for bile duct cancer.  Concern for stent migration would include fever, pain, progressive jaundice.  Plans are in place for endoscopic ultrasound.  At that point stent will be changed. ?  ? ?Home Medications ?Prior to Admission medications   ?Medication Sig Start Date End Date Taking? Authorizing Provider  ?amLODipine (NORVASC) 2.5 MG tablet Take 2.5 mg by mouth at bedtime.    [provider]  ?aspirin EC 81 MG tablet Take 81 mg by mouth daily after supper. Swallow whole.    [provider]  ?Cholecalciferol (VITAMIN D3) 50 MCG (2000 UT) TABS Take 2,000 Units by mouth daily after supper.    [provider]  ?ciprofloxacin (CIPRO) 500 MG tablet Take 1 tablet (500 mg total) by mouth 2 (two) times daily. 10/02/21   Irene Shipper, MD  ?Coenzyme Q10 (CO Q-10) 100 MG CAPS Take 100 mg by mouth every evening.    [provider]  ?gabapentin (NEURONTIN)  100 MG capsule Take 100 mg by mouth 2 (two) times daily.    [provider]  ?meclizine (ANTIVERT) 25 MG tablet Take 12.5-25 mg by mouth daily as needed for dizziness.    [provider]  ?Misc Natural Products (JOINT SUPPORT) CAPS Take 1 capsule by mouth daily with breakfast.    [provider]  ?Multiple Vitamin (MULTIVITAMIN) tablet Take 1 tablet by mouth daily with breakfast.    [provider]  ?Multiple Vitamins-Minerals ([place]) CAPS Take 1 capsule by mouth daily.    [provider]  ?nebivolol (BYSTOLIC) 5 MG tablet Take 2.5 mg by mouth in the morning.    [provider]  ?omeprazole (PRILOSEC) 40 MG capsule Take 40 mg by mouth daily before breakfast.    [provider]  ?polyethylene glycol (MIRALAX) 17 g packet Take 17 g by mouth daily as needed. 09/11/21   Shelly Coss, MD  ?primidone (MYSOLINE) 50 MG tablet Take 50 mg by mouth daily as needed (for tremors).    [provider]  ?pyridOXINE (VITAMIN B-6) 50 MG tablet Take 50 mg by mouth daily.    [provider]  ?tiZANidine (ZANAFLEX) 2 MG tablet Take 1 mg by mouth daily as needed for muscle spasms.    [provider]  ?   ? ?Allergies    ?Ivp dye [iodinated contrast media] and Lisinopril   ? ?Review of Systems   ?Review of Systems ? ?Physical Exam ?Updated Vital Signs ?BP Marland Kitchen)  168/72   Pulse 87   Temp 98.5 ?F (36.9 ?C) (Oral)   Resp (!) 28   Ht '5\' 7"'$  (1.702 m)   Wt 53.1 kg   SpO2 97%   BMI 18.32 kg/m?  ?Physical Exam ?Vitals and nursing note reviewed.  ?Constitutional:   ?   General: She is not in acute distress. ?   Appearance: She is well-developed. She is not ill-appearing, toxic-appearing or diaphoretic.  ?HENT:  ?   Head: Normocephalic and atraumatic.  ?   Right Ear: External ear normal.  ?   Left Ear: External ear normal.  ?   Nose: No congestion.  ?   Mouth/Throat:  ?   Pharynx: No oropharyngeal exudate or posterior oropharyngeal  erythema.  ?Eyes:  ?   General: Scleral icterus present.  ?   Conjunctiva/sclera: Conjunctivae normal.  ?   Pupils: Pupils are equal, round, and reactive to light.  ?Neck:  ?   Trachea: Phonation normal.  ?Cardiovascular:  ?   Rate and Rhythm: Normal rate and regular rhythm.  ?   Heart sounds: Normal heart sounds.  ?Pulmonary:  ?   Effort: Pulmonary effort is normal.  ?   Breath sounds: Normal breath sounds.  ?Abdominal:  ?   General: There is no distension.  ?   Palpations: Abdomen is soft. There is no mass.  ?   Tenderness: There is no abdominal tenderness.  ?   Hernia: No hernia is present.  ?Musculoskeletal:     ?   General: Normal range of motion.  ?   Cervical back: Normal range of motion and neck supple.  ?Skin: ?   General: Skin is warm and dry.  ?   Coloration: Skin is jaundiced.  ?Neurological:  ?   Mental Status: She is alert and oriented to person, place, and time.  ?   Cranial Nerves: No cranial nerve deficit.  ?   Sensory: No sensory deficit.  ?   Motor: No abnormal muscle tone.  ?   Coordination: Coordination normal.  ?Psychiatric:     ?   Mood and Affect: Mood normal.     ?   Behavior: Behavior normal.     ?   Thought Content: Thought content normal.     ?   Judgment: Judgment normal.  ? ? ?ED Results / Procedures / Treatments   ?Labs ?(all labs ordered are listed, but only abnormal results are displayed) ?Labs Reviewed  ?CBC WITH DIFFERENTIAL/PLATELET - Abnormal; Notable for the following components:  ?    Result Value  ? WBC 20.3 (*)   ? RBC 3.73 (*)   ? Hemoglobin 11.6 (*)   ? HCT 34.1 (*)   ? RDW 16.7 (*)   ? Platelets 418 (*)   ? Neutro Abs 17.8 (*)   ? Abs Immature Granulocytes 0.33 (*)   ? All other components within normal limits  ?COMPREHENSIVE METABOLIC PANEL - Abnormal; Notable for the following components:  ? Sodium 133 (*)   ? Glucose, Bld 116 (*)   ? Albumin 2.6 (*)   ? AST 164 (*)   ? ALT 187 (*)   ? Alkaline Phosphatase 514 (*)   ? Total Bilirubin 16.3 (*)   ? GFR, Estimated 57 (*)    ? All other components within normal limits  ?AMMONIA - Abnormal; Notable for the following components:  ? Ammonia 36 (*)   ? All other components within normal limits  ?LIPASE, BLOOD  ?PROTIME-INR  ?URINALYSIS,  ROUTINE W REFLEX MICROSCOPIC  ? ? ?EKG ?None ? ?Radiology ?US ABDOMEN LIMITED RUQ (LIVER/GB) ? ?Result Date: 10/03/2021 ?CLINICAL DATA:  Jaundice EXAM: ULTRASOUND ABDOMEN LIMITED RIGHT UPPER QUADRANT COMPARISON:  Right upper quadrant ultrasound 09/08/2021 FINDINGS: Gallbladder: The gallbladder is nondilated. Tiny 3 mm polyp or sludge noted along the gallbladder wall. There is mild nonspecific gallbladder wall thickening. No shadowing intraluminal stones. No pericholecystic fluid. Common bile duct: Diameter: 6 mm.  There is mild intrahepatic biliary ductal dilation. Liver: Heterogeneous liver parenchyma. Portal vein is patent on color Doppler imaging with normal direction of blood flow towards the liver. Other: None. IMPRESSION: Mild intrahepatic biliary ductal dilation. Normal caliber common bile duct. Mild nonspecific gallbladder wall thickening. Correlate with LFTs. Electronically Signed   By: Maurine Simmering M.D.   On: 10/03/2021 14:18   ? ?Procedures ?Procedures  ? ? ?Medications Ordered in ED ?Medications  ?0.9 %  sodium chloride infusion (has no administration in time range)  ?diclofenac suppository 100 mg (has no administration in time range)  ?ampicillin-sulbactam (UNASYN) 1.5 g in sodium chloride 0.9 % 100 mL IVPB (has no administration in time range)  ? ? ?ED Course/ Medical Decision Making/ A&P ?  ?                        ?Medical Decision Making ?Patient presenting for evaluation of jaundice which is continued to be essentially painless but is complicated by nausea.  Testing done yesterday indicates elevated direct, indirect and total bilirubin.  Patient is scheduled for management of liver stenting in 3 weeks.  She appears to have a complication of recent biliary stent placement, possibly obstructed  versus dislodgment. ? ?Amount and/or Complexity of Data Reviewed ?Labs: ordered. ?   Details: CBC, metabolic panel, PT/INR, ammonia level, lipase-normal except white count high, platelets high glucose high, tran

## 2021-10-03 NOTE — Anesthesia Postprocedure Evaluation (Signed)
Anesthesia Post Note ? ?Patient: Lisamarie Coke ? ?Procedure(s) Performed: ENDOSCOPIC RETROGRADE CHOLANGIOPANCREATOGRAPHY (ERCP) ?STENT REMOVAL ?BILIARY STENT PLACEMENT ? ?  ? ?Patient location during evaluation: PACU ?Anesthesia Type: General ?Level of consciousness: awake and alert ?Pain management: pain level controlled ?Vital Signs Assessment: post-procedure vital signs reviewed and stable ?Respiratory status: spontaneous breathing, nonlabored ventilation and respiratory function stable ?Cardiovascular status: stable and blood pressure returned to baseline ?Anesthetic complications: no ? ? ?No notable events documented. ? ?Last Vitals:  ?Vitals:  ? 10/03/21 1812 10/03/21 1812  ?BP: 126/60 126/60  ?Pulse: 75 76  ?Resp: 14 14  ?Temp: 36.8 ?C 36.8 ?C  ?SpO2:  97%  ?  ?Last Pain:  ?Vitals:  ? 10/03/21 1812  ?TempSrc: Oral  ?PainSc:   ? ? ?  ?  ?  ?  ?  ?  ? ?Audry Pili ? ? ? ? ?

## 2021-10-03 NOTE — ED Triage Notes (Signed)
Patient states she became jaundiced around [place] and then she had a stent placed due to a blockage. ? ?Patient states she has had intermittent jaundice since and total bilirubin 13.5, elevated AST, ALT. Patient was instructed to come to the ED for further care. ? ? ?

## 2021-10-03 NOTE — Telephone Encounter (Signed)
Called and spoke with patient's daughter. Judson Roch wanted to know if pt's procedure could be done as outpatient. I informed her again that it was recommended that pt go to the ER yesterday for a possible sooner procedure. I informed her that the endo slots are limited for outpatient procedures and more are usually held for inpatient procedures. She states that pt did not get started on the Cipro because "we called it in late". She states "what good is it going to do starting the medicine for a day?". I informed her that she needed to get the pt to the hospital especially since she has not started the antibiotic. She states that pt has not eaten anything today and wanted to know if they would be able to do the procedure today. I told her that I did not know for sure but to keep patient on clear liquids. She states that they are still trying to find someone that can stay with her father for the day. She states that they will get the ball rolling. Judson Roch had no other concerns at the end of the call. ?

## 2021-10-03 NOTE — Consult Note (Addendum)
? ? ? Consultation ? ?Referring Provider:   Dr. Eulis Foster ?Primary Care Physician:  Abigail Miyamoto, [place] ?Primary Gastroenterologist:   Dr. Henrene Pastor      ?Reason for Consultation: Elevated LFTs    ?       ? HPI:   ?Meagan [name] is a 87 y.o. 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 ?   09/26/2021 office visit with Dr. Henrene Pastor for follow-up after being admitted to [facility] 09/06/2021 with painless jaundice.  Unofficial liver tests at that time revealed an AST 216, ALT 310, alk phos 249 total bili 6.8, bilirubin as high as 8.4 on 09/09/2021.  Abdominal ultrasound revealed intra and extrahepatic ductal dilation, MRI was limited due to motion however demonstrated intrahepatic ductal dilation and mild gallbladder wall thickening.  CA 19-9 was elevated to 24, subsequently underwent ERCP on 09/10/2021 and was found to have an indeterminate mid common bile duct stricture with upstream dilation of the bile duct measuring 11 mm.  Brushings of the stricture were obtained and returned negative for malignancy.  A 10 French 7 cm plastic stent was placed in the bile duct and her liver tests had been trended since with most recent labs on 09/23/2021 which revealed AST 79, ALT 165, total bili 3.7.  Alk phos remained elevated at 462.  Plain films of the abdomen and recently been obtained showed stent in proper position.  At that time describes significant fatigue.  At that time plan was for Dr. Rush Landmark to evaluate the patient for possible repeat ERCP with EUS and spyglass and stent exchange. ?   10/03/2018 3 repeat LFTs with a total bili of 13.5, direct bili 9, alk phos 528, AST 154, ALT 182 and albumin 3.3.  At that time Dr.  Henrene Pastor removed and suspected stent dysfunction.  Is recommended she go to [facility] long hospital ASAP for an ERCP.  Also started her empirically on Ciprofloxacin 500 twice daily. ?   Today, the patient presents to the ER with her niece who is a Designer, jewellery.  They explain that the patient has been feeling worse over the past week or so and becoming more and more jaundiced.  Patient tells me she has even had a few episodes of vomiting and just felt like her legs are weak.  She tells me that "I feel the worst I have ever felt in my life".  Denies a fever.  Tells me they were unable to start antibiotics at home as they were only filled this morning. ?   Patient tells me her sick husband is at home and they have had to find care for him overnight which is why it took her so long to get here. ?   Denies fever, chills, weight loss or change in bowel habits. ? ?ER course: AST 164, ALT 187, alk phos 514, total bilirubin 16.3, CBC with a white count of 20.3, lipase normal, right upper quadrant ultrasound with mild intrahepatic biliary duct dilation, mild nonspecific gallbladder wall thickening ? ?Past Medical History:  ?Diagnosis Date  ? Essential tremor   ? GERD (gastroesophageal reflux disease)   ? High cholesterol   ? Hypertension   ? PONV (postoperative nausea and vomiting)   ? Vertigo   ? ? ?  Past Surgical History:  ?Procedure Laterality Date  ? BILIARY BRUSHING  09/10/2021  ? Procedure: BILIARY BRUSHING;  Surgeon: Irene Shipper, MD;  Location: [facility] ENDOSCOPY;  Service: Gastroenterology;;  ? BILIARY STENT PLACEMENT  09/10/2021  ? Procedure: BILIARY STENT PLACEMENT;  Surgeon: Irene Shipper, MD;  Location: [facility] ENDOSCOPY;  Service: Gastroenterology;;  ? ERCP N/A 09/10/2021  ? Procedure: ENDOSCOPIC RETROGRADE CHOLANGIOPANCREATOGRAPHY (ERCP);  Surgeon: Irene Shipper, MD;  Location: [place];  Service: Gastroenterology;  Laterality: N/A;  ? PACEMAKER IMPLANT    ? SPHINCTEROTOMY  09/10/2021  ? Procedure: SPHINCTEROTOMY;   Surgeon: Irene Shipper, MD;  Location: [facility] ENDOSCOPY;  Service: Gastroenterology;;  ? ? ?Family History  ?Problem Relation Age of Onset  ? Stomach cancer Neg Hx   ? Esophageal cancer Neg Hx   ? Colon cancer Neg Hx   ? Pancreatic cancer Neg Hx   ?  ? ?Social History  ? ?Tobacco Use  ? Smoking status: Never  ? Smokeless tobacco: Never  ?Vaping Use  ? Vaping Use: Never used  ?Substance Use Topics  ? Alcohol use: Never  ? Drug use: Never  ? ? ?Prior to Admission medications   ?Medication Sig Start Date End Date Taking? Authorizing Provider  ?amLODipine (NORVASC) 2.5 MG tablet Take 2.5 mg by mouth at bedtime.    [provider]  ?aspirin EC 81 MG tablet Take 81 mg by mouth daily after supper. Swallow whole.    [provider]  ?Cholecalciferol (VITAMIN D3) 50 MCG (2000 UT) TABS Take 2,000 Units by mouth daily after supper.    [provider]  ?ciprofloxacin (CIPRO) 500 MG tablet Take 1 tablet (500 mg total) by mouth 2 (two) times daily. 10/02/21   Irene Shipper, MD  ?Coenzyme Q10 (CO Q-10) 100 MG CAPS Take 100 mg by mouth every evening.    [provider]  ?gabapentin (NEURONTIN) 100 MG capsule Take 100 mg by mouth 2 (two) times daily.    [provider]  ?meclizine (ANTIVERT) 25 MG tablet Take 12.5-25 mg by mouth daily as needed for dizziness.    [provider]  ?Misc Natural Products (JOINT SUPPORT) CAPS Take 1 capsule by mouth daily with breakfast.    [provider]  ?Multiple Vitamin (MULTIVITAMIN) tablet Take 1 tablet by mouth daily with breakfast.    [provider]  ?Multiple Vitamins-Minerals ([place]) CAPS Take 1 capsule by mouth daily.    [provider]  ?nebivolol (BYSTOLIC) 5 MG tablet Take 2.5 mg by mouth in the morning.    [provider]  ?omeprazole (PRILOSEC) 40 MG capsule Take 40 mg by mouth daily before breakfast.    [provider]  ?polyethylene glycol (MIRALAX) 17 g packet Take 17 g  by mouth daily as needed. 09/11/21   Shelly Coss, MD  ?primidone (MYSOLINE) 50 MG tablet Take 50 mg by mouth daily as needed (for tremors).    [provider]  ?pyridOXINE (VITAMIN B-6) 50 MG tablet Take 50 mg by mouth daily.    [provider]  ?tiZANidine (ZANAFLEX) 2 MG tablet Take 1 mg by mouth daily as needed for muscle spasms.    [provider]  ? ? ?No current facility-administered medications for this encounter.  ? ?Current Outpatient Medications  ?Medication Sig Dispense Refill  ? amLODipine (NORVASC) 2.5 MG tablet Take 2.5 mg by mouth at bedtime.    ? aspirin EC 81 MG tablet Take 81 mg by  mouth daily after supper. Swallow whole.    ? Cholecalciferol (VITAMIN D3) 50 MCG (2000 UT) TABS Take 2,000 Units by mouth daily after supper.    ? ciprofloxacin (CIPRO) 500 MG tablet Take 1 tablet (500 mg total) by mouth 2 (two) times daily. 20 tablet 0  ? Coenzyme Q10 (CO Q-10) 100 MG CAPS Take 100 mg by mouth every evening.    ? gabapentin (NEURONTIN) 100 MG capsule Take 100 mg by mouth 2 (two) times daily.    ? meclizine (ANTIVERT) 25 MG tablet Take 12.5-25 mg by mouth daily as needed for dizziness.    ? Misc Natural Products (JOINT SUPPORT) CAPS Take 1 capsule by mouth daily with breakfast.    ? Multiple Vitamin (MULTIVITAMIN) tablet Take 1 tablet by mouth daily with breakfast.    ? Multiple Vitamins-Minerals (OCUVITE EYE HEALTH FORMULA) CAPS Take 1 capsule by mouth daily.    ? nebivolol (BYSTOLIC) 5 MG tablet Take 2.5 mg by mouth in the morning.    ? omeprazole (PRILOSEC) 40 MG capsule Take 40 mg by mouth daily before breakfast.    ? polyethylene glycol (MIRALAX) 17 g packet Take 17 g by mouth daily as needed. 14 each 0  ? primidone (MYSOLINE) 50 MG tablet Take 50 mg by mouth daily as needed (for tremors).    ? pyridOXINE (VITAMIN B-6) 50 MG tablet Take 50 mg by mouth daily.    ? tiZANidine (ZANAFLEX) 2 MG tablet Take 1 mg by mouth daily as needed for muscle spasms.    ? ? ?Allergies  as of 10/03/2021 - Review Complete 10/03/2021  ?Allergen Reaction Noted  ? Ivp dye [iodinated contrast media] Hives 03/26/2016  ? Lisinopril Cough 09/07/2021  ? ? ? ?Review of Systems:    ?Constitutional:

## 2021-10-03 NOTE — Anesthesia Preprocedure Evaluation (Addendum)
Anesthesia Evaluation  ?Patient identified by MRN, date of birth, ID band ?Patient awake ? ? ? ?Reviewed: ?Allergy & Precautions, NPO status , Patient's Chart, lab work & pertinent test results, reviewed documented beta blocker date and time  ? ?History of Anesthesia Complications ?(+) PONV and history of anesthetic complications ? ?Airway ?Mallampati: II ? ?TM Distance: >3 FB ?Neck ROM: Full ? ? ? Dental ? ?(+) Dental Advisory Given ?  ?Pulmonary ?neg pulmonary ROS,  ?  ?Pulmonary exam normal ? ? ? ? ? ? ? Cardiovascular ?hypertension, Pt. on medications and Pt. on home beta blockers ?Normal cardiovascular exam ? ? ?  ?Neuro/Psych ?negative neurological ROS ? negative psych ROS  ? GI/Hepatic ?Neg liver ROS, GERD  Medicated and Controlled, ?Bile duct stricture, malfunctioning stent, jaundice ? ?  ?Endo/Other  ?negative endocrine ROS ? Renal/GU ?negative Renal ROS  ? ?  ?Musculoskeletal ?negative musculoskeletal ROS ?(+)  ? Abdominal ?  ?Peds ? Hematology ?negative hematology ROS ?(+)   ?Anesthesia Other Findings ? ? Reproductive/Obstetrics ? ?  ? ? ? ? ? ? ? ? ? ? ? ? ? ?  ?  ? ? ? ? ? ? ? ?Anesthesia Physical ?Anesthesia Plan ? ?ASA: 2 ? ?Anesthesia Plan: General  ? ?Post-op Pain Management: Minimal or no pain anticipated  ? ?Induction: Intravenous ? ?PONV Risk Score and Plan: 4 or greater and Treatment may vary due to age or medical condition, Ondansetron and Propofol infusion ? ?Airway Management Planned: Oral ETT ? ?Additional Equipment: None ? ?Intra-op Plan:  ? ?Post-operative Plan: Extubation in OR ? ?Informed Consent: I have reviewed the patients History and Physical, chart, labs and discussed the procedure including the risks, benefits and alternatives for the proposed anesthesia with the patient or authorized representative who has indicated his/her understanding and acceptance.  ? ? ? ?Dental advisory given ? ?Plan Discussed with: CRNA and Anesthesiologist ? ?Anesthesia  Plan Comments:   ? ? ? ? ? ?Anesthesia Quick Evaluation ? ?

## 2021-10-03 NOTE — ED Provider Triage Note (Signed)
Emergency Medicine Provider Triage Evaluation Note ? ?Meagan [name] , a 87 y.o. female  was evaluated in triage.  Pt complains of jaundice.  Patient was admitted back in early April and noted to have a biliary stricture.  She underwent an outpatient CT scan which showed biliary ductal diet dilatation and gallbladder wall thickening.  She underwent an MRCP and an ERCP and had a stent placed.  There was no evidence of malignancy at that time.  Patient has had her lab work trended and states that she has not felt very well since this admission.  Her labs suddenly increased significantly and she was sent into the emergency department for further evaluation.  The patient had 2 episodes of vomiting.  Her niece who is a Designer, jewellery states that she is "much more jaundiced than she has been."  Patient denies abdominal pain.  She has no soaking night sweats or weight loss.  She has been more tremor tremorous than normal and has essential tremor at baseline. ? ?Review of Systems  ?Positive: jaundice ?Negative: fever ? ?Physical Exam  ?BP (!) 173/75 (BP Location: Left Arm)   Pulse 64   Temp 98.5 ?F (36.9 ?C) (Oral)   Resp 16   SpO2 95%  ?Gen:   Awake, no distress   ?Resp:  Normal effort  ?MSK:   Moves extremities without difficulty  ?Other:  No ttp abd ? ?Medical Decision Making  ?Medically screening exam initiated at 12:59 PM.  Appropriate orders placed.  Maddux Pfefferkorn was informed that the remainder of the evaluation will be completed by another provider, this initial triage assessment does not replace that evaluation, and the importance of remaining in the ED until their evaluation is complete. ? ?Work up intitated ?  ?Margarita Mail, PA-C ?10/03/21 1959 ? ?

## 2021-10-03 NOTE — Anesthesia Procedure Notes (Signed)
Procedure Name: Intubation ?Date/Time: 10/03/2021 4:15 PM ?Performed by: Gean Maidens, CRNA ?Pre-anesthesia Checklist: Patient identified, Emergency Drugs available, Suction available, Patient being monitored and Timeout performed ?Patient Re-evaluated:Patient Re-evaluated prior to induction ?Oxygen Delivery Method: Circle system utilized ?Preoxygenation: Pre-oxygenation with 100% oxygen ?Induction Type: IV induction ?Ventilation: Mask ventilation without difficulty ?Laryngoscope Size: Mac and 3 ?Grade View: Grade I ?Tube type: Oral ?Tube size: 7.0 mm ?Number of attempts: 1 ?Airway Equipment and Method: Stylet ?Placement Confirmation: ETT inserted through vocal cords under direct vision, positive ETCO2 and breath sounds checked- equal and bilateral ?Secured at: 21 cm ?Tube secured with: Tape ?Dental Injury: Teeth and Oropharynx as per pre-operative assessment  ? ? ? ? ?

## 2021-10-04 DIAGNOSIS — K831 Obstruction of bile duct: Secondary | ICD-10-CM | POA: Diagnosis not present

## 2021-10-04 LAB — CBC
HCT: 27.2 % — ABNORMAL LOW (ref 36.0–46.0)
Hemoglobin: 9.3 g/dL — ABNORMAL LOW (ref 12.0–15.0)
MCH: 31.3 pg (ref 26.0–34.0)
MCHC: 34.2 g/dL (ref 30.0–36.0)
MCV: 91.6 fL (ref 80.0–100.0)
Platelets: 310 10*3/uL (ref 150–400)
RBC: 2.97 MIL/uL — ABNORMAL LOW (ref 3.87–5.11)
RDW: 16.8 % — ABNORMAL HIGH (ref 11.5–15.5)
WBC: 13.4 10*3/uL — ABNORMAL HIGH (ref 4.0–10.5)
nRBC: 0 % (ref 0.0–0.2)

## 2021-10-04 LAB — COMPREHENSIVE METABOLIC PANEL
ALT: 143 U/L — ABNORMAL HIGH (ref 0–44)
AST: 137 U/L — ABNORMAL HIGH (ref 15–41)
Albumin: 2 g/dL — ABNORMAL LOW (ref 3.5–5.0)
Alkaline Phosphatase: 425 U/L — ABNORMAL HIGH (ref 38–126)
Anion gap: 10 (ref 5–15)
BUN: 24 mg/dL — ABNORMAL HIGH (ref 8–23)
CO2: 24 mmol/L (ref 22–32)
Calcium: 9 mg/dL (ref 8.9–10.3)
Chloride: 101 mmol/L (ref 98–111)
Creatinine, Ser: 1.12 mg/dL — ABNORMAL HIGH (ref 0.44–1.00)
GFR, Estimated: 48 mL/min — ABNORMAL LOW (ref >60)
Glucose, Bld: 86 mg/dL (ref 70–99)
Potassium: 4 mmol/L (ref 3.5–5.1)
Sodium: 135 mmol/L (ref 135–145)
Total Bilirubin: 14.2 mg/dL — ABNORMAL HIGH (ref 0.3–1.2)
Total Protein: 5.5 g/dL — ABNORMAL LOW (ref 6.5–8.1)

## 2021-10-04 MED ORDER — DIPHENHYDRAMINE HCL 25 MG PO CAPS
25.0000 mg | ORAL_CAPSULE | Freq: Once | ORAL | Status: AC
  Administered 2021-10-04: 25 mg via ORAL
  Filled 2021-10-04: qty 1

## 2021-10-04 NOTE — Discharge Summary (Addendum)
?Discharge Summary ? ?Meagan [name] QAS:[id number] DOB: [birth year] ? ?PCP: Abigail Miyamoto, [place] ? ?Admit date: 10/03/2021 ?Discharge date: 10/04/2021 ? ?Time spent: 49mns ? ?Recommendations for Outpatient Follow-up:  ?F/u with PCP within a week  for hospital discharge follow up,  ?F/u with GI ?repeat cbc/Cmp at follow up ?No new meds, continue all home meds, no need of abx per gi ? ? ? ?Discharge Diagnoses:  ?Active Hospital Problems  ? Diagnosis Date Noted  ? Hyperbilirubinemia 10/03/2021  ? Leukocytosis 10/03/2021  ? Bile duct obstruction   ? Gastroesophageal reflux disease 11/02/2017  ? Essential hypertension 09/15/2016  ? Mixed hyperlipidemia 09/15/2016  ?  ?Resolved Hospital Problems  ?No resolved problems to display.  ? ? ?Discharge Condition: stable ? ?Diet recommendation: heart healthy ? ?Filed Weights  ? 10/03/21 1259 10/03/21 1540  ?Weight: 53.1 kg 53.1 kg  ? ? ?History of present illness: ( per admitting MD Dr OOlevia Bowens ?HPI: HAllisa Einspahris a 87y.o. female with medical history significant of essential tremor, GERD, hyperlipidemia, hypertension, vertigo who is coming to the emergency department due to developing jaundice again after she had hyperbilirubinemia around Easter and had a stent placed on 09/10/2021.  Since then, she has been having intermittent jaundice and pruritus of the palms/plantar areas.  Her stools have been lighter.  Gastroenterology evaluated her today.  She underwent ERCP with stent removal and new biliary stent placement.  GI would like to observe her overnight.  She has been nauseous on location.  She denied fever, chills, headache, dyspnea, chest pain, diaphoresis, palpitations, PND or lower extremity edema.  No flank pain, dysuria, frequency or hematuria. ?  ?ED course: Initial vital signs were temperature 98.5 ?F, pulse 64, respirations 16, BP 173/75 mmHg and O2 sat 95% on room air.  The patient received diphenhydramine 25 mg IVP and 1.5 g of Unasyn IVPB. ?  ?Lab work: CBC  showed a white count of 20.3 with 88% neutrophils, hemoglobin 11.6 g/dL platelets 418.  PT, INR were unremarkable.  Lipase was normal.  Ammonia mildly elevated at 36 ?mol/L.lab17 CMP showed a sodium 133 mmol/L, but all other electrolytes and renal functions were normal.  Glucose 116 and total bilirubin 16.3 mg/dL.  Albumin is 2.6 g/dL.  Transaminases and alk phosphatase are mildly elevated. ?  ?Imaging: RUQ UKoreashowed mild intrahepatic biliary ductal dilatation. ? ?Hospital Course:  ?Principal Problem: ?  Hyperbilirubinemia ?Active Problems: ?  Bile duct obstruction ?  Essential hypertension ?  Gastroesophageal reflux disease ?  Mixed hyperlipidemia ?  Leukocytosis ? ? ?Assessment and Plan: ? ?Hyperbilirubinemia/  Bile duct obstruction ?Status post ERCP with sent placement, denies pain, no n/v today,  seen by GI Dr GCarlean Purltoday. she is cleared to go home with close gi follow up. ?No need of abx per GI. ?  ? ?  Leukocytosis ?No fever or obvious signs of infection. ?Wbc coming down significantly today ? ? Gastroesophageal reflux disease ?  Essential hypertension ?  Mixed hyperlipidemia ?Stable, continue home meds, f/u with pcp ? ? ?Discharge Exam: ?BP (!) 154/77 (BP Location: Right Arm)   Pulse 70   Temp 98 ?F (36.7 ?C) (Oral)   Resp 18   Ht 5' 7"  (1.702 m)   Wt 53.1 kg   SpO2 100%   BMI 18.32 kg/m?  ? ?General: NAD, + jaundice, ( reports improving) ?Cardiovascular: RRR ?Respiratory: normal respiratory effort  ? ? ? ?Discharge Instructions   ? ? Diet - low sodium heart healthy   Complete  by: As directed ?  ? Increase activity slowly   Complete by: As directed ?  ? ?  ? ?Allergies as of 10/04/2021   ? ?   Reactions  ? Ivp Dye [iodinated Contrast Media] Hives  ? Lisinopril Cough  ? ?  ? ?  ?Medication List  ?  ? ?STOP taking these medications   ? ?ciprofloxacin 500 MG tablet ?Commonly known as: CIPRO ?  ? ?  ? ?TAKE these medications   ? ?amLODipine 2.5 MG tablet ?Commonly known as: NORVASC ?Take 2.5 mg by mouth at  bedtime. ?  ?aspirin EC 81 MG tablet ?Take 81 mg by mouth daily. Swallow whole. ?  ?Co Q-10 100 MG Caps ?Take 100 mg by mouth every evening. ?  ?famotidine 40 MG tablet ?Commonly known as: PEPCID ?Take 40 mg by mouth daily. ?  ?gabapentin 100 MG capsule ?Commonly known as: NEURONTIN ?Take 100 mg by mouth 2 (two) times daily. ?  ?hydroxypropyl methylcellulose / hypromellose 2.5 % ophthalmic solution ?Commonly known as: ISOPTO TEARS / GONIOVISC ?Place 1 drop into both eyes daily as needed for dry eyes. ?  ?Joint Support Caps ?Take 1 capsule by mouth daily with breakfast. ?  ?meclizine 25 MG tablet ?Commonly known as: ANTIVERT ?Take 25 mg by mouth daily as needed for dizziness. ?  ?multivitamin tablet ?Take 1 tablet by mouth daily with breakfast. ?  ?nebivolol 5 MG tablet ?Commonly known as: BYSTOLIC ?Take 2.5 mg by mouth in the morning. ?  ?[place] ?Take 1 capsule by mouth daily. ?  ?omeprazole 40 MG capsule ?Commonly known as: PRILOSEC ?Take 40 mg by mouth daily before breakfast. ?  ?ondansetron 4 MG disintegrating tablet ?Commonly known as: ZOFRAN-ODT ?Take 4 mg by mouth every 8 (eight) hours as needed for nausea or vomiting. ?  ?polyethylene glycol 17 g packet ?Commonly known as: MiraLax ?Take 17 g by mouth daily as needed. ?What changed: reasons to take this ?  ?primidone 50 MG tablet ?Commonly known as: MYSOLINE ?Take 25 mg by mouth daily as needed (for tremors). ?  ?pyridOXINE 50 MG tablet ?Commonly known as: VITAMIN B-6 ?Take 100 mg by mouth daily. ?  ?tiZANidine 2 MG tablet ?Commonly known as: ZANAFLEX ?Take 1 mg by mouth daily. ?  ?Vitamin D3 50 MCG (2000 UT) Tabs ?Take 2,000 Units by mouth daily after supper. ?  ? ?  ? ?Allergies  ?Allergen Reactions  ? Ivp Dye [Iodinated Contrast Media] Hives  ? Lisinopril Cough  ? ? Follow-up Information   ? ? Abigail Miyamoto, [name] Follow up.   ?Specialty: Family Medicine ?Contact information: ?[address] ?[place]  [id number] ?[phone] ? ? ?  ?  ? ? Irene Shipper, MD Follow up.   ?Specialty: Gastroenterology ?Why: Office will contact next week  and arrange lab follow-up ?Contact information: ?[address] ?[place] [address] ?[phone] ? ? ?  ?  ? ?  ?  ? ?  ? ? ? ?The results of significant diagnostics from this hospitalization (including imaging, microbiology, ancillary and laboratory) are listed below for reference.   ? ?Significant Diagnostic Studies: ?DG Chest 1 View ? ?Result Date: 09/08/2021 ?CLINICAL DATA:  Pacemaker placement. EXAM: CHEST  1 VIEW COMPARISON:  None. FINDINGS: The heart size and mediastinal contours are within normal limits. Both lungs are clear. Left-sided pacemaker is noted with leads in grossly good position. No pneumothorax or pleural effusion is noted. The visualized skeletal structures are unremarkable. IMPRESSION:  Left-sided pacemaker is noted with leads in grossly good position. Electronically Signed   By: Marijo Conception M.D.   On: 09/08/2021 10:11  ? ?DG Abd 1 View ? ?Result Date: 09/23/2021 ?CLINICAL DATA:  Check biliary stent EXAM: ABDOMEN - 1 VIEW COMPARISON:  ERCP images 412 2023 FINDINGS: Plastic stent overlies the lumbar spine to the right of midline consistent with a common bile duct stent. Position appears similar to the prior images. Normal bowel gas pattern. Pacemaker with lead in the right ventricle. IMPRESSION: Biliary stent appears to be in good position in the region of the common bile duct. Electronically Signed   By: Franchot Gallo M.D.   On: 09/23/2021 16:56  ? ?DG ERCP ? ?Result Date: 10/04/2021 ?CLINICAL DATA:  Jaundice, biliary stent placement EXAM: ERCP TECHNIQUE: Multiple spot images obtained with the fluoroscopic device and submitted for interpretation post-procedure. COMPARISON:  09/10/2021 FINDINGS: A series of fluoroscopic spot images document endoscopic cannulation and removal of plastic biliary stent, and placement of a new plastic biliary stent across the  distal CBD. Proximal CBD is dilated. Incomplete opacification of the intrahepatic biliary tree. IMPRESSION: Endoscopic CBD intervention with biliary stent exchange. These images were submitted for radiologic interpretation only. P

## 2021-10-04 NOTE — Progress Notes (Signed)
Reviewed discharge paperwork with patient & daughter. Went over medication regimen & follow up appointments.  ?

## 2021-10-04 NOTE — H&P (View-Only) (Signed)
   Patient Name: Meagan [name] Date of Encounter: 10/04/2021, 12:18 PM    Subjective  Some pruritis overnight but ok today, appetite better as is nausea   Objective  BP (!) 154/77 (BP Location: Right Arm)   Pulse 70   Temp 98 F (36.7 C) (Oral)   Resp 18   Ht '5\' 7"'$  (1.702 m)   Wt 53.1 kg   SpO2 100%   BMI 18.32 kg/m  Jaundiced NAD Lungs cta Cor NL Abd soft NT  Recent Labs  Lab 10/02/21 1357 10/03/21 1321 10/04/21 0612  AST 154* 164* 137*  ALT 182* 187* 143*  ALKPHOS 528* 514* 425*  BILITOT 13.5* 16.3* 14.2*  PROT 6.5 6.7 5.5*  ALBUMIN 3.3* 2.6* 2.0*  INR  --  1.0  --       Latest Ref Rng & Units 10/04/2021    6:12 AM 10/03/2021    1:21 PM 09/18/2021    3:36 PM  CBC  WBC 4.0 - 10.5 K/uL 13.4   20.3   6.9    Hemoglobin 12.0 - 15.0 g/dL 9.3   11.6   11.1    Hematocrit 36.0 - 46.0 % 27.2   34.1   33.6    Platelets 150 - 400 K/uL 310   418   301.0     Recent Labs  Lab 10/03/21 1321 10/04/21 0612  NA 133* 135  K 3.9 4.0  CL 99 101  CO2 23 24  GLUCOSE 116* 86  BUN 22 24*  CREATININE 0.98 1.12*  CALCIUM 9.6 9.0       Assessment and Plan  Bile duct stricture and stent dysfunction - improved after stent replacement  Decreased Hgb - likely dilutional  OK to dc home - we will contact from office early next week and arrange lab f/u  Gatha Mayer, MD, [facility] 10/04/2021 12:18 PM

## 2021-10-04 NOTE — Progress Notes (Signed)
? ?  Patient Name: Meagan [name] ?Date of Encounter: 10/04/2021, 12:18 PM ?  ? Subjective  ?Some pruritis overnight but ok today, appetite better as is nausea ? ? Objective  ?BP (!) 154/77 (BP Location: Right Arm)   Pulse 70   Temp 98 ?F (36.7 ?C) (Oral)   Resp 18   Ht '5\' 7"'$  (1.702 m)   Wt 53.1 kg   SpO2 100%   BMI 18.32 kg/m?  ?Jaundiced NAD ?Lungs cta ?Cor NL ?Abd soft NT ? ?Recent Labs  ?Lab 10/02/21 ?1357 10/03/21 ?1321 10/04/21 ?0612  ?AST 154* 164* 137*  ?ALT 182* 187* 143*  ?ALKPHOS 528* 514* 425*  ?BILITOT 13.5* 16.3* 14.2*  ?PROT 6.5 6.7 5.5*  ?ALBUMIN 3.3* 2.6* 2.0*  ?INR  --  1.0  --   ? ? ?  Latest Ref Rng & Units 10/04/2021  ?  6:12 AM 10/03/2021  ?  1:21 PM 09/18/2021  ?  3:36 PM  ?CBC  ?WBC 4.0 - 10.5 K/uL 13.4   20.3   6.9    ?Hemoglobin 12.0 - 15.0 g/dL 9.3   11.6   11.1    ?Hematocrit 36.0 - 46.0 % 27.2   34.1   33.6    ?Platelets 150 - 400 K/uL 310   418   301.0    ? ?Recent Labs  ?Lab 10/03/21 ?1321 10/04/21 ?0612  ?NA 133* 135  ?K 3.9 4.0  ?CL 99 101  ?CO2 23 24  ?GLUCOSE 116* 86  ?BUN 22 24*  ?CREATININE 0.98 1.12*  ?CALCIUM 9.6 9.0  ? ? ? ? ? Assessment and Plan  ?Bile duct stricture and stent dysfunction - improved after stent replacement ? ?Decreased Hgb - likely dilutional ? ?OK to dc home - we will contact from office early next week and arrange lab f/u ? ?Gatha Mayer, MD, [facility] ?[facility] ?10/04/2021 12:18 PM ? ? ?

## 2021-10-05 ENCOUNTER — Encounter (HOSPITAL_COMMUNITY): Payer: Self-pay | Admitting: Internal Medicine

## 2021-10-06 ENCOUNTER — Telehealth: Payer: Self-pay

## 2021-10-06 ENCOUNTER — Other Ambulatory Visit: Payer: Self-pay

## 2021-10-06 DIAGNOSIS — R7989 Other specified abnormal findings of blood chemistry: Secondary | ICD-10-CM

## 2021-10-06 NOTE — Telephone Encounter (Signed)
-----   Message from Gatha Mayer, MD sent at 10/04/2021 12:22 PM EDT ----- ?Regarding: lab f/u ?Meagan [name], ? ?This lady needs CMET checked around 5/10 - had bile duct stent replaced 5/5 ? ?Daughter asking if they could do at [facility] or possibly through PCP and fax (though willing to come to [place] it is a long drive) ? ?Forestine Na labs will show up in [facility] so maybe that is best  ? ?Please arrange , Thx ? ?CEG ? ? ? ?

## 2021-10-06 NOTE — Telephone Encounter (Signed)
Order in for [facility] at [facility], pt knows to go between 8am and 3:50pm on 10/08/21. ?

## 2021-10-08 ENCOUNTER — Other Ambulatory Visit (HOSPITAL_COMMUNITY)
Admission: RE | Admit: 2021-10-08 | Discharge: 2021-10-08 | Disposition: A | Discharge status: Home / Self Care | Payer: Medicare HMO | Source: Ambulatory Visit | Attending: Internal Medicine | Admitting: Internal Medicine

## 2021-10-08 DIAGNOSIS — R7989 Other specified abnormal findings of blood chemistry: Secondary | ICD-10-CM | POA: Diagnosis present

## 2021-10-08 LAB — COMPREHENSIVE METABOLIC PANEL
ALT: 102 U/L — ABNORMAL HIGH (ref 0–44)
AST: 64 U/L — ABNORMAL HIGH (ref 15–41)
Albumin: 2.5 g/dL — ABNORMAL LOW (ref 3.5–5.0)
Alkaline Phosphatase: 439 U/L — ABNORMAL HIGH (ref 38–126)
Anion gap: 8 (ref 5–15)
BUN: 22 mg/dL (ref 8–23)
CO2: 26 mmol/L (ref 22–32)
Calcium: 9.2 mg/dL (ref 8.9–10.3)
Chloride: 98 mmol/L (ref 98–111)
Creatinine, Ser: 1.27 mg/dL — ABNORMAL HIGH (ref 0.44–1.00)
GFR, Estimated: 41 mL/min — ABNORMAL LOW (ref >60)
Glucose, Bld: 118 mg/dL — ABNORMAL HIGH (ref 70–99)
Potassium: 4.1 mmol/L (ref 3.5–5.1)
Sodium: 132 mmol/L — ABNORMAL LOW (ref 135–145)
Total Bilirubin: 5.3 mg/dL — ABNORMAL HIGH (ref 0.3–1.2)
Total Protein: 6.1 g/dL — ABNORMAL LOW (ref 6.5–8.1)

## 2021-10-09 ENCOUNTER — Other Ambulatory Visit: Payer: Self-pay

## 2021-10-09 DIAGNOSIS — K831 Obstruction of bile duct: Secondary | ICD-10-CM

## 2021-10-09 DIAGNOSIS — R7989 Other specified abnormal findings of blood chemistry: Secondary | ICD-10-CM

## 2021-10-13 NOTE — Op Note (Signed)
[facility] ?Patient Name: Meagan [name] ?Procedure Date: 10/03/2021 ?MRN: [id number] ?Attending MD: Gatha Mayer , MD ?Date of Birth: 08/17/1934 ?CSN: [id number] ?Age: 87 ?Admit Type: Inpatient ?Procedure:                ERCP ?Indications:              Jaundice, Stent change ?Providers:                Gatha Mayer, MD, Jeanella Cara, RN,  ?                          Benetta Spar, Technician ?Referring MD:              ?Medicines:                General Anesthesia, Unasyn 1.5 g IV, diclofenac 100  ?                          mg per rectum ?Complications:            No immediate complications. ?Estimated Blood Loss:     Estimated blood loss: none. ?Procedure:                Pre-Anesthesia Assessment: ?                          - Prior to the procedure, a History and Physical  ?                          was performed, and patient medications and  ?                          allergies were reviewed. The patient's tolerance of  ?                          previous anesthesia was also reviewed. The risks  ?                          and benefits of the procedure and the sedation  ?                          options and risks were discussed with the patient.  ?                          All questions were answered, and informed consent  ?                          was obtained. Prior Anticoagulants: The patient has  ?                          taken no previous anticoagulant or antiplatelet  ?                          agents. ASA Grade Assessment: II - A patient with  ?  mild systemic disease. After reviewing the risks  ?                          and benefits, the patient was deemed in  ?                          satisfactory condition to undergo the procedure. ?                          After obtaining informed consent, the scope was  ?                          passed under direct vision. Throughout the  ?                          procedure, the patient's blood pressure,  pulse, and  ?                          oxygen saturations were monitored continuously. The  ?                          [employer] D single use  ?                          duodenoscope was introduced through the mouth, and  ?                          used to inject contrast into and used to inject  ?                          contrast into the bile duct. The ERCP was  ?                          accomplished without difficulty. The patient  ?                          tolerated the procedure well. ?Scope In: ?Scope Out: ?Findings: ?     Esophagus not seen well. Stomach grossly normal. Duodenum w/ plastic  ?     stent protruding from papilla, a few cm out - no bile seen. Scout film  ?     suggested proximal stent above stricture. I removed the stent with a  ?     snare and then replaced it with another 10 Fr 7 cm stent - using wire  ?     that was introduced by sphincterotome. Similar findings as before -  ?     mid-bile duct stricture with upstream dilation - CHD into main  ?     intrahepatics. New stent in place w/ copious bile drainage and no signs  ?     of cholangitis. pancreas not entered by intent. 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 This stricture was treated  ?                          with stent replacement 10 Fr 7 cm. the original  ?                          stent had migtrated slightly and I think the  ?                          proximal aspect was clogged though not certain. The  ?                          proximal side flange was patent, though. ?Moderate Sedation: ?     Not Applicable - Patient had care per Anesthesia. ?Recommendation:           - Observation overnight and home tomorrow assuming  ?                          bilirubin decreasing. ?                          - Will arrange outpatient f/u - she is awaiting an   ?                          EUS and we will check LFT's at some point also. No  ?                          need for antibiotics now. ?                          - Clear liquids today and solid food tomorrow if ok ?Procedure Code(s):        --- Professional --- ?                          [phone], Endoscopic retrograde  ?                          cholangiopancreatography (ERCP); with removal and  ?                          exchange of stent(s), biliary or pancreatic duct,  ?                          including pre- and post-dilation and guide wire  ?                          passage, when performed, including sphincterotomy,  ?                          when performed, each stent exchanged ?Diagnosis Code(s):        ---  Professional --- ?                          K83.1, Obstruction of bile duct ?                          R17, Unspecified jaundice ?                          Z46.59, Encounter for fitting and adjustment of  ?                          other gastrointestinal appliance and device ?CPT copyright 2019 American Medical Association. All rights reserved. ?The codes documented in this report are preliminary and upon coder review may  ?be revised to meet current compliance requirements. ?Gatha Mayer, MD ?10/03/2021 5:07:34 PM ?This report has been signed electronically. ?Number of Addenda: 0 ?

## 2021-10-17 ENCOUNTER — Telehealth: Payer: Self-pay | Admitting: Gastroenterology

## 2021-10-17 NOTE — Telephone Encounter (Signed)
Inbound call from patient stating that she wanting to speak with the nurse. Patient is not feeling well and has lost weight and is very weak. Patient is seeking advice, please advise.

## 2021-10-17 NOTE — Telephone Encounter (Signed)
This is a Dr Henrene Pastor pt will send to [facility]

## 2021-10-17 NOTE — Telephone Encounter (Signed)
Spoke with pt and she is states she feels she is getting weaker and not a good appetite. Pt wonders if she really needs to have the EUS done on 5/31. Discussed with pt that she is supposed to have labs done on 5/25 and Dr. Henrene Pastor can look at the results at that time. Pt verbalized understanding. Discussed with pt that if she felt bad and got weaker she may need to go to the ER for fluids. Pt verbalized understanding and will get labs done next week.

## 2021-10-20 ENCOUNTER — Encounter (HOSPITAL_COMMUNITY): Payer: Self-pay | Admitting: Gastroenterology

## 2021-10-23 ENCOUNTER — Other Ambulatory Visit (HOSPITAL_COMMUNITY)
Admission: RE | Admit: 2021-10-23 | Discharge: 2021-10-23 | Disposition: A | Discharge status: Home / Self Care | Payer: Medicare HMO | Source: Ambulatory Visit | Attending: Internal Medicine | Admitting: Internal Medicine

## 2021-10-23 DIAGNOSIS — R17 Unspecified jaundice: Secondary | ICD-10-CM | POA: Diagnosis present

## 2021-10-23 DIAGNOSIS — K831 Obstruction of bile duct: Secondary | ICD-10-CM | POA: Insufficient documentation

## 2021-10-23 DIAGNOSIS — R7989 Other specified abnormal findings of blood chemistry: Secondary | ICD-10-CM | POA: Insufficient documentation

## 2021-10-23 LAB — COMPREHENSIVE METABOLIC PANEL
ALT: 74 U/L — ABNORMAL HIGH (ref 0–44)
AST: 37 U/L (ref 15–41)
Albumin: 2.7 g/dL — ABNORMAL LOW (ref 3.5–5.0)
Alkaline Phosphatase: 530 U/L — ABNORMAL HIGH (ref 38–126)
Anion gap: 5 (ref 5–15)
BUN: 21 mg/dL (ref 8–23)
CO2: 26 mmol/L (ref 22–32)
Calcium: 9.1 mg/dL (ref 8.9–10.3)
Chloride: 103 mmol/L (ref 98–111)
Creatinine, Ser: 1.3 mg/dL — ABNORMAL HIGH (ref 0.44–1.00)
GFR, Estimated: 40 mL/min — ABNORMAL LOW (ref >60)
Glucose, Bld: 129 mg/dL — ABNORMAL HIGH (ref 70–99)
Potassium: 4 mmol/L (ref 3.5–5.1)
Sodium: 134 mmol/L — ABNORMAL LOW (ref 135–145)
Total Bilirubin: 2.4 mg/dL — ABNORMAL HIGH (ref 0.3–1.2)
Total Protein: 6.3 g/dL — ABNORMAL LOW (ref 6.5–8.1)

## 2021-10-24 NOTE — Telephone Encounter (Addendum)
Patents daughter Meagan [name] calling to go over lab results.

## 2021-10-24 NOTE — Telephone Encounter (Signed)
The pt and her daughter have been advised of the labs and to make sure to keep the appt with [facility] on 5/31.  The pt has been advised of the information and verbalized understanding.

## 2021-10-24 NOTE — Telephone Encounter (Signed)
Meagan Shipper, MD  10/23/2021  4:42 PM EDT     [name], Please let the patient know that her liver tests are still abnormal, but overall have improved. She has an outpatient procedure with Dr. Rush Landmark on Wednesday, May 31.  Make sure that she knows to keep this appointment.  The timing of future liver tests to be determined after his evaluation. Thanks, Dr. Henrene Pastor

## 2021-10-29 ENCOUNTER — Ambulatory Visit (HOSPITAL_BASED_OUTPATIENT_CLINIC_OR_DEPARTMENT_OTHER): Payer: Medicare HMO | Admitting: Anesthesiology

## 2021-10-29 ENCOUNTER — Other Ambulatory Visit: Payer: Self-pay

## 2021-10-29 ENCOUNTER — Encounter (HOSPITAL_COMMUNITY): Payer: Self-pay | Admitting: Gastroenterology

## 2021-10-29 ENCOUNTER — Ambulatory Visit (HOSPITAL_COMMUNITY)
Admission: RE | Admit: 2021-10-29 | Discharge: 2021-10-29 | Disposition: A | Discharge status: Home / Self Care | Payer: Medicare HMO | Attending: Gastroenterology | Admitting: Gastroenterology

## 2021-10-29 ENCOUNTER — Ambulatory Visit (HOSPITAL_COMMUNITY): Payer: Medicare HMO | Admitting: Anesthesiology

## 2021-10-29 ENCOUNTER — Ambulatory Visit (HOSPITAL_COMMUNITY): Payer: Medicare HMO

## 2021-10-29 ENCOUNTER — Encounter (HOSPITAL_COMMUNITY)
Admission: RE | Disposition: A | Discharge status: Home / Self Care | Payer: Self-pay | Source: Home / Self Care | Attending: Gastroenterology

## 2021-10-29 DIAGNOSIS — R945 Abnormal results of liver function studies: Secondary | ICD-10-CM | POA: Insufficient documentation

## 2021-10-29 DIAGNOSIS — K219 Gastro-esophageal reflux disease without esophagitis: Secondary | ICD-10-CM | POA: Diagnosis not present

## 2021-10-29 DIAGNOSIS — R8569 Abnormal cytological findings in specimens from other digestive organs and abdominal cavity: Secondary | ICD-10-CM | POA: Diagnosis not present

## 2021-10-29 DIAGNOSIS — Z4659 Encounter for fitting and adjustment of other gastrointestinal appliance and device: Secondary | ICD-10-CM | POA: Insufficient documentation

## 2021-10-29 DIAGNOSIS — K831 Obstruction of bile duct: Secondary | ICD-10-CM

## 2021-10-29 DIAGNOSIS — R748 Abnormal levels of other serum enzymes: Secondary | ICD-10-CM | POA: Diagnosis not present

## 2021-10-29 DIAGNOSIS — K3189 Other diseases of stomach and duodenum: Secondary | ICD-10-CM | POA: Insufficient documentation

## 2021-10-29 DIAGNOSIS — K769 Liver disease, unspecified: Secondary | ICD-10-CM

## 2021-10-29 DIAGNOSIS — I10 Essential (primary) hypertension: Secondary | ICD-10-CM | POA: Diagnosis not present

## 2021-10-29 DIAGNOSIS — K838 Other specified diseases of biliary tract: Secondary | ICD-10-CM | POA: Diagnosis not present

## 2021-10-29 DIAGNOSIS — R932 Abnormal findings on diagnostic imaging of liver and biliary tract: Secondary | ICD-10-CM | POA: Insufficient documentation

## 2021-10-29 HISTORY — PX: BILIARY BRUSHING: SHX6843

## 2021-10-29 HISTORY — PX: BILIARY DILATION: SHX6850

## 2021-10-29 HISTORY — PX: SPYGLASS CHOLANGIOSCOPY: SHX5441

## 2021-10-29 HISTORY — PX: BIOPSY: SHX5522

## 2021-10-29 HISTORY — PX: REMOVAL OF STONES: SHX5545

## 2021-10-29 HISTORY — PX: ESOPHAGOGASTRODUODENOSCOPY (EGD) WITH PROPOFOL: SHX5813

## 2021-10-29 HISTORY — PX: EUS: SHX5427

## 2021-10-29 HISTORY — PX: FINE NEEDLE ASPIRATION: SHX5430

## 2021-10-29 HISTORY — PX: BILIARY STENT PLACEMENT: SHX5538

## 2021-10-29 HISTORY — PX: ENDOSCOPIC RETROGRADE CHOLANGIOPANCREATOGRAPHY (ERCP) WITH PROPOFOL: SHX5810

## 2021-10-29 SURGERY — UPPER ENDOSCOPIC ULTRASOUND (EUS) RADIAL
Anesthesia: General

## 2021-10-29 MED ORDER — GLUCAGON HCL RDNA (DIAGNOSTIC) 1 MG IJ SOLR
INTRAMUSCULAR | Status: AC
  Filled 2021-10-29: qty 1

## 2021-10-29 MED ORDER — PHENYLEPHRINE HCL (PRESSORS) 10 MG/ML IV SOLN
INTRAVENOUS | Status: DC | PRN
  Administered 2021-10-29 (×4): 80 ug via INTRAVENOUS
  Administered 2021-10-29 (×2): 160 ug via INTRAVENOUS
  Administered 2021-10-29 (×3): 80 ug via INTRAVENOUS

## 2021-10-29 MED ORDER — FENTANYL CITRATE (PF) 100 MCG/2ML IJ SOLN
25.0000 ug | INTRAMUSCULAR | Status: DC | PRN
  Administered 2021-10-29: 100 ug via INTRAVENOUS

## 2021-10-29 MED ORDER — ONDANSETRON HCL 4 MG/2ML IJ SOLN: 4.0000 mg | Freq: Once | INTRAMUSCULAR | Status: DC | PRN

## 2021-10-29 MED ORDER — ONDANSETRON HCL 4 MG/2ML IJ SOLN
INTRAMUSCULAR | Status: DC | PRN
  Administered 2021-10-29: 4 mg via INTRAVENOUS

## 2021-10-29 MED ORDER — EPHEDRINE SULFATE (PRESSORS) 50 MG/ML IJ SOLN
INTRAMUSCULAR | Status: DC | PRN
  Administered 2021-10-29: 5 mg via INTRAVENOUS

## 2021-10-29 MED ORDER — SODIUM CHLORIDE 0.9 % IV SOLN: INTRAVENOUS | Status: DC

## 2021-10-29 MED ORDER — DIPHENHYDRAMINE HCL 50 MG/ML IJ SOLN
INTRAMUSCULAR | Status: DC | PRN
  Administered 2021-10-29: 12.5 mg via INTRAVENOUS

## 2021-10-29 MED ORDER — DICLOFENAC SUPPOSITORY 100 MG
RECTAL | Status: AC
  Filled 2021-10-29: qty 1

## 2021-10-29 MED ORDER — DICLOFENAC SUPPOSITORY 100 MG
RECTAL | Status: DC | PRN
  Administered 2021-10-29: 100 mg via RECTAL

## 2021-10-29 MED ORDER — CIPROFLOXACIN HCL 500 MG PO TABS: 500.0000 mg | ORAL_TABLET | Freq: Two times a day (BID) | ORAL | 0 refills | Status: AC

## 2021-10-29 MED ORDER — CIPROFLOXACIN IN D5W 400 MG/200ML IV SOLN
INTRAVENOUS | Status: DC | PRN
  Administered 2021-10-29: 400 mg via INTRAVENOUS

## 2021-10-29 MED ORDER — FENTANYL CITRATE (PF) 100 MCG/2ML IJ SOLN
INTRAMUSCULAR | Status: AC
  Filled 2021-10-29: qty 2

## 2021-10-29 MED ORDER — ROCURONIUM BROMIDE 100 MG/10ML IV SOLN
INTRAVENOUS | Status: DC | PRN
  Administered 2021-10-29: 50 mg via INTRAVENOUS

## 2021-10-29 MED ORDER — DIPHENHYDRAMINE HCL 50 MG/ML IJ SOLN
INTRAMUSCULAR | Status: AC
  Filled 2021-10-29: qty 1

## 2021-10-29 MED ORDER — METHYLPREDNISOLONE SODIUM SUCC 125 MG IJ SOLR
125.0000 mg | INTRAMUSCULAR | Status: DC
  Filled 2021-10-29: qty 2

## 2021-10-29 MED ORDER — CIPROFLOXACIN IN D5W 400 MG/200ML IV SOLN
INTRAVENOUS | Status: AC
  Filled 2021-10-29: qty 200

## 2021-10-29 MED ORDER — SODIUM CHLORIDE 0.9 % IV SOLN
INTRAVENOUS | Status: DC | PRN
  Administered 2021-10-29: 80 mL

## 2021-10-29 MED ORDER — PROPOFOL 10 MG/ML IV BOLUS
INTRAVENOUS | Status: DC | PRN
  Administered 2021-10-29: 80 mg via INTRAVENOUS

## 2021-10-29 MED ORDER — OXYCODONE HCL 5 MG/5ML PO SOLN: 5.0000 mg | Freq: Once | ORAL | Status: DC | PRN

## 2021-10-29 MED ORDER — LACTATED RINGERS IV SOLN: INTRAVENOUS | Status: DC

## 2021-10-29 MED ORDER — GLUCAGON HCL RDNA (DIAGNOSTIC) 1 MG IJ SOLR
INTRAMUSCULAR | Status: DC | PRN
  Administered 2021-10-29 (×3): .25 mg via INTRAVENOUS

## 2021-10-29 MED ORDER — DEXAMETHASONE SODIUM PHOSPHATE 10 MG/ML IJ SOLN
INTRAMUSCULAR | Status: DC | PRN
  Administered 2021-10-29: 10 mg via INTRAVENOUS

## 2021-10-29 MED ORDER — LIDOCAINE HCL (CARDIAC) PF 100 MG/5ML IV SOSY
PREFILLED_SYRINGE | INTRAVENOUS | Status: DC | PRN
  Administered 2021-10-29: 60 mg via INTRAVENOUS

## 2021-10-29 MED ORDER — OXYCODONE HCL 5 MG PO TABS: 5.0000 mg | ORAL_TABLET | Freq: Once | ORAL | Status: DC | PRN

## 2021-10-29 MED ORDER — SUGAMMADEX SODIUM 500 MG/5ML IV SOLN
INTRAVENOUS | Status: DC | PRN
  Administered 2021-10-29: 140 mg via INTRAVENOUS

## 2021-10-29 NOTE — Anesthesia Postprocedure Evaluation (Signed)
Anesthesia Post Note  Patient: Tax inspector  Procedure(s) Performed: UPPER ENDOSCOPIC ULTRASOUND (EUS) RADIAL ENDOSCOPIC RETROGRADE CHOLANGIOPANCREATOGRAPHY (ERCP) WITH PROPOFOL SPYGLASS CHOLANGIOSCOPY FINE NEEDLE ASPIRATION (FNA) LINEAR BILIARY BRUSHING BILIARY DILATION BILIARY STENT PLACEMENT REMOVAL OF SLUDGE     Patient location during evaluation: PACU Anesthesia Type: General Level of consciousness: awake and alert Pain management: pain level controlled Vital Signs Assessment: post-procedure vital signs reviewed and stable Respiratory status: spontaneous breathing, nonlabored ventilation, respiratory function stable and patient connected to nasal cannula oxygen Cardiovascular status: blood pressure returned to baseline and stable Postop Assessment: no apparent nausea or vomiting Anesthetic complications: no   No notable events documented.  Last Vitals:  Vitals:   10/29/21 1240 10/29/21 1251  BP: (!) 142/59 (!) 133/50  Pulse: 76 74  Resp: 19 20  Temp:    SpO2: 100% 97%    Last Pain:  Vitals:   10/29/21 1251  TempSrc:   PainSc: 0-No pain                 Linford Quintela S

## 2021-10-29 NOTE — Discharge Instructions (Signed)
YOU HAD AN ENDOSCOPIC PROCEDURE TODAY: Refer to the procedure report and other information in the discharge instructions given to you for any specific questions about what was found during the examination. If this information does not answer your questions, please call [place] office at [phone] to clarify.   YOU SHOULD EXPECT: Some feelings of bloating in the abdomen. Passage of more gas than usual. Walking can help get rid of the air that was put into your GI tract during the procedure and reduce the bloating. If you had a lower endoscopy (such as a colonoscopy or flexible sigmoidoscopy) you may notice spotting of blood in your stool or on the toilet paper. Some abdominal soreness may be present for a day or two, also.  DIET: Your first meal following the procedure should be a light meal and then it is ok to progress to your normal diet. A half-sandwich or bowl of soup is an example of a good first meal. Heavy or fried foods are harder to digest and may make you feel nauseous or bloated. Drink plenty of fluids but you should avoid alcoholic beverages for 24 hours. If you had a esophageal dilation, please see attached instructions for diet.    ACTIVITY: Your care partner should take you home directly after the procedure. You should plan to take it easy, moving slowly for the rest of the day. You can resume normal activity the day after the procedure however YOU SHOULD NOT DRIVE, use power tools, machinery or perform tasks that involve climbing or major physical exertion for 24 hours (because of the sedation medicines used during the test).   SYMPTOMS TO REPORT IMMEDIATELY: A gastroenterologist can be reached at any hour. Please call [phone]  for any of the following symptoms:   Following upper endoscopy (EGD, EUS, ERCP, esophageal dilation) Vomiting of blood or coffee ground material  New, significant abdominal pain  New, significant chest pain or pain under the shoulder blades  Painful or  persistently difficult swallowing  New shortness of breath  Black, tarry-looking or red, bloody stools  FOLLOW UP:  If any biopsies were taken you will be contacted by phone or by letter within the next 1-3 weeks. Call [phone]  if you have not heard about the biopsies in 3 weeks.  Please also call with any specific questions about appointments or follow up tests.  

## 2021-10-29 NOTE — Anesthesia Procedure Notes (Signed)
Procedure Name: Intubation Date/Time: 10/29/2021 9:48 AM Performed by: Jonna Munro, CRNA Pre-anesthesia Checklist: Patient identified, Emergency Drugs available, Suction available, Patient being monitored and Timeout performed Patient Re-evaluated:Patient Re-evaluated prior to induction Oxygen Delivery Method: Circle system utilized Preoxygenation: Pre-oxygenation with 100% oxygen Induction Type: IV induction Ventilation: Mask ventilation without difficulty Laryngoscope Size: Mac and 3 Grade View: Grade I Tube type: Oral Tube size: 7.0 mm Number of attempts: 1 Airway Equipment and Method: Stylet Secured at: 22 cm Tube secured with: Tape Dental Injury: Teeth and Oropharynx as per pre-operative assessment

## 2021-10-29 NOTE — Op Note (Signed)
[facility] Patient Name: Meagan [name] Procedure Date: 10/29/2021 MRN: [id number] Attending MD: Justice Britain , MD Date of Birth: [birth year] CSN: [id number] Age: 87 Admit Type: Outpatient Procedure:                ERCP Indications:              Common bile duct stricture, Abnormal abdominal CT,                            Abnormal MRCP, Jaundice, Abnormal liver function                            test, Stent change, Prior Endoscopic Retrograde                            Cholangiopancreatography Providers:                Justice Britain, MD, Doristine Johns, RN,                            Luan Moore, Technician Referring MD:             Docia Chuck. Henrene Pastor, MD, Gatha Mayer, MD, Abigail Miyamoto Medicines:                General Anesthesia, Cipro 400 mg IV, Diclofenac 100                            mg rectal, Glucagon 2.97 mg IV Complications:            No immediate complications. Estimated Blood Loss:     Estimated blood loss was minimal. Procedure:                Pre-Anesthesia Assessment:                           - Prior to the procedure, a History and Physical                            was performed, and patient medications and                            allergies were reviewed. The patient's tolerance of                            previous anesthesia was also reviewed. The risks                            and benefits of the procedure and the sedation                            options and risks were discussed with the patient.  All questions were answered, and informed consent                            was obtained. Prior Anticoagulants: The patient has                            taken no previous anticoagulant or antiplatelet                            agents except for aspirin. ASA Grade Assessment:                            III - A patient with severe systemic disease. After                             reviewing the risks and benefits, the patient was                            deemed in satisfactory condition to undergo the                            procedure.                           After obtaining informed consent, the scope was                            passed under direct vision. Throughout the                            procedure, the patient's blood pressure, pulse, and                            oxygen saturations were monitored continuously. The                            [employer] D single use                            duodenoscope was introduced through the mouth, and                            used to inject contrast into and used to locate the                            major papilla. Due to an air issue, I transitioned                            to the [id number]-[id number] ([id number]) Olympus duodenoscope was                            introduced through the mouth, and used to inject  contrast into and used for direct visualization of                            the bile duct. The ERCP was accomplished without                            difficulty. The patient tolerated the procedure. Scope In: Scope Out: Findings:      A biliary stent was visible on the scout film.      The esophagus was successfully intubated under direct vision without       detailed examination of the pharynx, larynx, and associated structures,       and upper GI tract. A biliary sphincterotomy had been performed. The       sphincterotomy appeared open. One plastic biliary stent originating in       the biliary tree was emerging from the major papilla. The stent was       visibly patent. One stent was removed from the biliary tree using a       snare and sent for cytology.      See EUS report next steps in evaluation.      After EUS was completed, we returned to the ERCP.      A short 0.035 inch Soft Jagwire was passed into the biliary tree. The        Hydratome sphincterotome was passed over the guidewire and the bile duct       was then deeply cannulated. Contrast was injected. I personally       interpreted the bile duct images. Ductal flow of contrast was adequate.       Image quality was adequate. Contrast extended to the bifurcation.       Opacification of the main bile duct was successful. The lower third of       the main bile duct contained a single severe stenosis 30 mm in length.       The middle third of the main bile duct and upper third of the main bile       duct were moderately dilated, secondary to aforementioned stricture. The       largest diameter was 15 mm. To discover objects, the biliary tree was       swept with a retrieval balloon. Sludge was swept from the duct. Cells       for cytology were obtained by brushing in the stricture in the lower       third of the main bile duct with 2 separate brushes.      We were still awaiting preliminary cytology from the EUS, so we decied       to proceed with Spyscope Cholangioscopy attempt at getting further       tissue.The bile duct was explored endoscopically using the SpyGlass       direct visualization system. The SpyScope was advanced to the lower       third of the main duct. Visibility with the scope was fair. The lower       third of the main bile duct contained a single severe stenosis which we       could not traverse. The lower third of the main bile duct in the       strictured area was biopsied with a SpyBite miniature biopsy forceps for       histology.  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 We decided to place a       more permanent stent to try and minimize procedures if possible on this       individual. One 10 mm by 6 cm Boston Wallflex uncovered metal biliary       stent was placed into the common bile duct. Bile flowed through the       stent. The stent was in  good position. The proximal portion of the stent       was quite tight so I decided to proceed with gentle dilation of the       common bile duct stent with a Hurricane 6 mm balloon dilator to allow       stent expansion. This was successful.      A pancreatogram was not performed.      The duodenoscope was withdrawn from the patient. Impression:               - Prior biliary sphincterotomy appeared open.                           - One visibly patent stent from the biliary tree                            was seen in the major papilla - removed and sent                            for cytology.                           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 The                            stricture was malignant appearing.                           - The upper third of the main bile duct and middle                            third of the main bile duct were moderately                            dilated, secondary to aforementioned stricture.                           - The biliary tree was swept and sludge was found.                           - Cells for cytology obtained in the lower third of                            the  main duct stricture x 2.                           - Cholangioscopy performed and could not traverse                            the stricture. Spybite biopsies performed.                           - As we received preliminary cytology results of                            malignancy, we transitioned and placed one                            uncovered metal biliary stent was placed into the                            common bile duct. The stent was dilated to allow                            for further stent expansion. Moderate Sedation:      Not Applicable - Patient had care per Anesthesia. Recommendation:           - The patient will be observed post-procedure,                             until all discharge criteria are met.                           - Discharge patient to home.                           - Patient has a contact number available for                            emergencies. The signs and symptoms of potential                            delayed complications were discussed with the                            patient. Return to normal activities tomorrow.                            Written discharge instructions were provided to the                            patient.                           - Low fat diet.                           - Observe patient's clinical course.                           -  Await cytology results and await path results.                           - Once results return will proceed with discuss                            with patient's primary GI and proceed with                            Oncology/Radiation Oncology referral. Will need                            potentially updated imaging and/or Liver evaluation                            to consider if the liver lesion may be more than a                            cyst as noted on previous imaging or more                            concerning as noted in the EUS report for a                            possible metastasis.                           - Watch for pancreatitis, bleeding, perforation,                            and cholangitis.                           - The findings and recommendations were discussed                            with the patient.                           - The findings and recommendations were discussed                            with the patient's family. Procedure Code(s):        --- Professional ---                           [phone], Endoscopic retrograde                            cholangiopancreatography (ERCP); with removal and                            exchange of stent(s), biliary or pancreatic duct,  including pre- and  post-dilation and guide wire                            passage, when performed, including sphincterotomy,                            when performed, each stent exchanged                           43264, Endoscopic retrograde                            cholangiopancreatography (ERCP); with removal of                            calculi/debris from biliary/pancreatic duct(s)                           43273, Endoscopic cannulation of papilla with                            direct visualization of pancreatic/common bile                            duct(s) (List separately in addition to code(s) for                            primary procedure) Diagnosis Code(s):        --- Professional ---                           F81.01, Presence of other specified functional                            implants                           K83.1, Obstruction of bile duct                           Z46.59, Encounter for fitting and adjustment of                            other gastrointestinal appliance and device                           R17, Unspecified jaundice                           R94.5, Abnormal results of liver function studies                           Z98.890, Other specified postprocedural states                           R93.5, Abnormal findings on diagnostic imaging of  other abdominal regions, including retroperitoneum                           R93.2, Abnormal findings on diagnostic imaging of                            liver and biliary tract CPT copyright 2019 American Medical Association. All rights reserved. The codes documented in this report are preliminary and upon coder review may  be revised to meet current compliance requirements. Justice Britain, MD 10/29/2021 1:32:59 PM Number of Addenda: 0

## 2021-10-29 NOTE — Anesthesia Preprocedure Evaluation (Signed)
Anesthesia Evaluation  Patient identified by MRN, date of birth, ID band Patient awake    Reviewed: Allergy & Precautions, NPO status , Patient's Chart, lab work & pertinent test results  History of Anesthesia Complications (+) PONV and history of anesthetic complications  Airway Mallampati: II  TM Distance: >3 FB Neck ROM: Full    Dental no notable dental hx.    Pulmonary neg pulmonary ROS,    Pulmonary exam normal breath sounds clear to auscultation       Cardiovascular hypertension, Normal cardiovascular exam Rhythm:Regular Rate:Normal     Neuro/Psych negative neurological ROS  negative psych ROS   GI/Hepatic Neg liver ROS, GERD  ,  Endo/Other  negative endocrine ROS  Renal/GU negative Renal ROS  negative genitourinary   Musculoskeletal negative musculoskeletal ROS (+)   Abdominal   Peds negative pediatric ROS (+)  Hematology negative hematology ROS (+)   Anesthesia Other Findings   Reproductive/Obstetrics negative OB ROS                             Anesthesia Physical Anesthesia Plan  ASA: 2  Anesthesia Plan: General   Post-op Pain Management: Minimal or no pain anticipated   Induction: Intravenous  PONV Risk Score and Plan: 4 or greater and Ondansetron, Dexamethasone and Treatment may vary due to age or medical condition  Airway Management Planned: Oral ETT  Additional Equipment:   Intra-op Plan:   Post-operative Plan: Extubation in OR  Informed Consent: I have reviewed the patients History and Physical, chart, labs and discussed the procedure including the risks, benefits and alternatives for the proposed anesthesia with the patient or authorized representative who has indicated his/her understanding and acceptance.     Dental advisory given  Plan Discussed with: CRNA and Surgeon  Anesthesia Plan Comments:         Anesthesia Quick Evaluation

## 2021-10-29 NOTE — Op Note (Signed)
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 Henrene Pastor, MD, Gatha Mayer, MD, Abigail Miyamoto Medicines:                General Anesthesia Complications:            No immediate complications. Estimated Blood Loss:     Estimated blood loss was minimal. Procedure:                Pre-Anesthesia Assessment:                           - Prior to the procedure, a History and Physical                            was performed, and patient medications and                            allergies were reviewed. The patient's tolerance of                            previous anesthesia was also reviewed. The risks                            and benefits of the procedure and the sedation                            options and risks were discussed with the patient.                            All questions were answered, and informed consent                            was obtained. Prior Anticoagulants: The patient has                            taken no previous anticoagulant or antiplatelet  agents except for aspirin. ASA Grade Assessment:                            III - A patient with severe systemic disease. After                            reviewing the risks and benefits, the patient was                            deemed in satisfactory condition to undergo the                            procedure.                           After obtaining  informed consent, the endoscope was                            passed under direct vision. Throughout the                            procedure, the patient's blood pressure, pulse, and                            oxygen saturations were monitored continuously. The                            [id number] ([id number] Olympus linear ultrasound scope                            was introduced through the mouth, and advanced to                            the duodenum for ultrasound examination from the                            stomach and duodenum. The upper EUS was                            accomplished without difficulty. The patient                            tolerated the procedure. 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      No gross mucosal lesions were noted in the duodenal bulb, in the first       portion of the duodenum and in the second portion of the duodenum.       Patent sphincterotomy (previous biliary stent had been removed).      ENDOSONOGRAPHIC FINDING: :      An irregular lesion was identified in the superior pancreatic head with       some evidence of some cystic component as well. The area was hypoechoic       and noted where the biliary stricturing looked to  be present. The mass       measured 28 mm by 22 mm in maximal cross-sectional diameter. The outer       margins were irregular. 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 An intact       interface was seen between the lesion and the superior mesenteric artery       and celiac trunk suggesting a lack of invasion. The remainder of the       pancreas was examined. The endosonographic appearance of parenchyma and       the upstream pancreatic duct indicated some mild duct dilation and       parenchymal atrophy. Fine needle biopsy was performed. Color Doppler       imaging was utilized prior to needle puncture to confirm  a lack of       significant vascular structures within the needle path. Six passes were       made with the 22 gauge ultrasound core biopsy needle using a       transduodenal approach. A visible core of tissue was obtained.       Preliminary cytologic examination and touch preps were performed. Final       cytology results are pending.      There was dilation in the common hepatic duct with evidence of sludge       present.      Moderate hyperechoic material consistent with sludge was visualized       endosonographically in the gallbladder.      A lesion was found in the visualized portion of the liver. The lesion       was hypoechoic not anechoic. The lesion measured 14 mm by 12 mm in       maximal cross-sectional diameter. There was a blood vessel in my window       to try and reach it, so I could not attempt FNA/FNB.      No malignant-appearing lymph nodes were visualized in the celiac region       (level 20), peripancreatic region and porta hepatis region.      The celiac region was visualized. Impression:               EGD Impression:                           - J-shaped deformity of stomach.                           - No gross lesions in the duodenal bulb, in the                            first portion of the duodenum and in the second                            portion of the duodenum.                           - Previous biliary stent had been removed with                            patent sphinterotomy present.  EUS Impression:                           - A mass-like area/region was identified in the                            superior pancreatic head. Cytology results are                            pending. However, the endosonographic appearance is                            suggestive of adenocarcinoma. This was staged T2 N0                            M1 (based on endosonographic criteria of metastasis                            to the liver -  tissue was not obtained due to blood                            vessel being in the way). The staging applies if                            malignancy is confirmed. Fine needle biopsy                            performed.                           - There was dilation in the common hepatic duct                            with sludge.                           - Hyperechoic material consistent with sludge was                            visualized endosonographically in the gallbladder.                           - A lesion was found in the visualized portion of                            the liver. The lesion was hypoechoic not anechoic                            and is concerning for a possible liver metastasis                            rather than cyst (but could still end up being).  The lesion measured 13 mm by 10 mm.                           - No malignant-appearing lymph nodes were                            visualized in the celiac region (level 20),                            peripancreatic region and porta hepatis region. Moderate Sedation:      Not Applicable - Patient had care per Anesthesia. Recommendation:           - Proceed to scheduled ERCP.                           - Observe patient's clinical course.                           - Ciprofloxacin 500 mg twice daily for 3-days to                            decrease post-infectious complications (IV Cipro                            was administered during ERCP).                           - Observe patient's clinical course.                           - Await cytology results.                           - The findings and recommendations were discussed                            with the patient.                           - The findings and recommendations were discussed                            with the patient's family. Procedure Code(s):        --- Professional ---                           [phone],  Esophagogastroduodenoscopy, flexible,                            transoral; with transendoscopic ultrasound-guided                            intramural or transmural fine needle                            aspiration/biopsy(s), (includes endoscopic  ultrasound examination limited to the esophagus,                            stomach or duodenum, and adjacent structures) Diagnosis Code(s):        --- Professional ---                           K31.89, Other diseases of stomach and duodenum                           K86.89, Other specified diseases of pancreas                           K83.8, Other specified diseases of biliary tract                           K76.9, Liver disease, unspecified                           I89.9, Noninfective disorder of lymphatic vessels                            and lymph nodes, unspecified                           K83.1, Obstruction of bile duct                           R74.8, Abnormal levels of other serum enzymes CPT copyright 2019 [employer]. All rights reserved. The codes documented in this report are preliminary and upon coder review may  be revised to meet current compliance requirements. Justice Britain, MD 10/29/2021 1:22:02 PM Number of Addenda: 0

## 2021-10-29 NOTE — Interval H&P Note (Signed)
History and Physical Interval Note:  10/29/2021 8:51 AM  Meagan [name]  has presented today for surgery, with the diagnosis of common duct stricture.  The various methods of treatment have been discussed with the patient and family. After consideration of risks, benefits and other options for treatment, the patient has consented to  Procedure(s): UPPER ENDOSCOPIC ULTRASOUND (EUS) RADIAL (N/A) ENDOSCOPIC RETROGRADE CHOLANGIOPANCREATOGRAPHY (ERCP) WITH PROPOFOL (N/A) SPYGLASS CHOLANGIOSCOPY (N/A) as a surgical intervention.  The patient's history has been reviewed, patient examined, no change in status, stable for surgery.  I have reviewed the patient's chart and labs.  Questions were answered to the patient's satisfaction.    The risks of an EUS including intestinal perforation, bleeding, infection, aspiration, and medication effects were discussed as was the possibility it may not give a definitive diagnosis if a biopsy is performed.  When a biopsy of the pancreas is done as part of the EUS, there is an additional risk of pancreatitis at the rate of about 1-2%.  It was explained that procedure related pancreatitis is typically mild, although it can be severe and even life threatening, which is why we do not perform random pancreatic biopsies and only biopsy a lesion/area we feel is concerning enough to warrant the risk.  The risks of an ERCP were discussed at length, including but not limited to the risk of perforation, bleeding, abdominal pain, post-ERCP pancreatitis (while usually mild can be severe and even life threatening).   [employer]

## 2021-10-29 NOTE — Transfer of Care (Signed)
Immediate Anesthesia Transfer of Care Note  Patient: Meagan [name]  Procedure(s) Performed: UPPER ENDOSCOPIC ULTRASOUND (EUS) RADIAL ENDOSCOPIC RETROGRADE CHOLANGIOPANCREATOGRAPHY (ERCP) WITH PROPOFOL SPYGLASS CHOLANGIOSCOPY FINE NEEDLE ASPIRATION (FNA) LINEAR BILIARY BRUSHING BILIARY DILATION BILIARY STENT PLACEMENT REMOVAL OF SLUDGE  Patient Location: PACU  Anesthesia Type:General  Level of Consciousness: awake, alert , oriented and patient cooperative  Airway & Oxygen Therapy: Patient Spontanous Breathing and Patient connected to face mask oxygen  Post-op Assessment: Report given to RN, Post -op Vital signs reviewed and stable and Patient moving all extremities X 4  Post vital signs: Reviewed and stable  Last Vitals:  Vitals Value Taken Time  BP 142/59 10/29/21 1240  Temp    Pulse 75 10/29/21 1243  Resp 16 10/29/21 1243  SpO2 99 % 10/29/21 1243  Vitals shown include unvalidated device data.  Last Pain:  Vitals:   10/29/21 1239  TempSrc:   PainSc: Asleep         Complications: No notable events documented.

## 2021-10-30 LAB — SURGICAL PATHOLOGY

## 2021-10-31 ENCOUNTER — Other Ambulatory Visit: Payer: Self-pay

## 2021-10-31 DIAGNOSIS — C801 Malignant (primary) neoplasm, unspecified: Secondary | ICD-10-CM

## 2021-10-31 LAB — CYTOLOGY - NON PAP

## 2021-11-02 ENCOUNTER — Encounter: Payer: Self-pay | Admitting: Gastroenterology

## 2021-11-10 ENCOUNTER — Ambulatory Visit (HOSPITAL_COMMUNITY)
Admission: RE | Admit: 2021-11-10 | Discharge: 2021-11-10 | Disposition: A | Discharge status: Home / Self Care | Payer: Medicare HMO | Source: Ambulatory Visit | Attending: Gastroenterology | Admitting: Gastroenterology

## 2021-11-10 ENCOUNTER — Other Ambulatory Visit (HOSPITAL_COMMUNITY)
Admission: RE | Admit: 2021-11-10 | Discharge: 2021-11-10 | Disposition: A | Discharge status: Home / Self Care | Payer: Medicare HMO | Source: Ambulatory Visit | Attending: Gastroenterology | Admitting: Gastroenterology

## 2021-11-10 DIAGNOSIS — R918 Other nonspecific abnormal finding of lung field: Secondary | ICD-10-CM | POA: Insufficient documentation

## 2021-11-10 DIAGNOSIS — R17 Unspecified jaundice: Secondary | ICD-10-CM | POA: Diagnosis not present

## 2021-11-10 DIAGNOSIS — C801 Malignant (primary) neoplasm, unspecified: Secondary | ICD-10-CM | POA: Diagnosis present

## 2021-11-10 DIAGNOSIS — C250 Malignant neoplasm of head of pancreas: Secondary | ICD-10-CM | POA: Diagnosis not present

## 2021-11-10 DIAGNOSIS — Z91041 Radiographic dye allergy status: Secondary | ICD-10-CM | POA: Insufficient documentation

## 2021-11-10 DIAGNOSIS — Z9889 Other specified postprocedural states: Secondary | ICD-10-CM | POA: Insufficient documentation

## 2021-11-10 DIAGNOSIS — K838 Other specified diseases of biliary tract: Secondary | ICD-10-CM | POA: Diagnosis not present

## 2021-11-10 DIAGNOSIS — I700 Atherosclerosis of aorta: Secondary | ICD-10-CM | POA: Insufficient documentation

## 2021-11-10 LAB — CBC WITH DIFFERENTIAL/PLATELET
Abs Immature Granulocytes: 0.02 10*3/uL (ref 0.00–0.07)
Basophils Absolute: 0.1 10*3/uL (ref 0.0–0.1)
Basophils Relative: 1 %
Eosinophils Absolute: 0.1 10*3/uL (ref 0.0–0.5)
Eosinophils Relative: 1 %
HCT: 33.4 % — ABNORMAL LOW (ref 36.0–46.0)
Hemoglobin: 10.8 g/dL — ABNORMAL LOW (ref 12.0–15.0)
Immature Granulocytes: 0 %
Lymphocytes Relative: 18 %
Lymphs Abs: 1.4 10*3/uL (ref 0.7–4.0)
MCH: 30.9 pg (ref 26.0–34.0)
MCHC: 32.3 g/dL (ref 30.0–36.0)
MCV: 95.4 fL (ref 80.0–100.0)
Monocytes Absolute: 0.5 10*3/uL (ref 0.1–1.0)
Monocytes Relative: 7 %
Neutro Abs: 5.7 10*3/uL (ref 1.7–7.7)
Neutrophils Relative %: 73 %
Platelets: 394 10*3/uL (ref 150–400)
RBC: 3.5 MIL/uL — ABNORMAL LOW (ref 3.87–5.11)
RDW: 15.5 % (ref 11.5–15.5)
WBC: 7.8 10*3/uL (ref 4.0–10.5)
nRBC: 0 % (ref 0.0–0.2)

## 2021-11-10 LAB — COMPREHENSIVE METABOLIC PANEL
ALT: 29 U/L (ref 0–44)
AST: 29 U/L (ref 15–41)
Albumin: 3.2 g/dL — ABNORMAL LOW (ref 3.5–5.0)
Alkaline Phosphatase: 223 U/L — ABNORMAL HIGH (ref 38–126)
Anion gap: 7 (ref 5–15)
BUN: 27 mg/dL — ABNORMAL HIGH (ref 8–23)
CO2: 25 mmol/L (ref 22–32)
Calcium: 10.3 mg/dL (ref 8.9–10.3)
Chloride: 104 mmol/L (ref 98–111)
Creatinine, Ser: 1.59 mg/dL — ABNORMAL HIGH (ref 0.44–1.00)
GFR, Estimated: 32 mL/min — ABNORMAL LOW (ref >60)
Glucose, Bld: 113 mg/dL — ABNORMAL HIGH (ref 70–99)
Potassium: 4.7 mmol/L (ref 3.5–5.1)
Sodium: 136 mmol/L (ref 135–145)
Total Bilirubin: 1.4 mg/dL — ABNORMAL HIGH (ref 0.3–1.2)
Total Protein: 6.9 g/dL (ref 6.5–8.1)

## 2021-11-11 ENCOUNTER — Other Ambulatory Visit (HOSPITAL_COMMUNITY): Payer: Self-pay

## 2021-11-11 ENCOUNTER — Inpatient Hospital Stay (HOSPITAL_COMMUNITY): Payer: Medicare HMO | Attending: Hematology | Admitting: Hematology

## 2021-11-11 DIAGNOSIS — C259 Malignant neoplasm of pancreas, unspecified: Secondary | ICD-10-CM | POA: Diagnosis present

## 2021-11-11 DIAGNOSIS — R634 Abnormal weight loss: Secondary | ICD-10-CM | POA: Insufficient documentation

## 2021-11-11 DIAGNOSIS — Z8041 Family history of malignant neoplasm of ovary: Secondary | ICD-10-CM | POA: Insufficient documentation

## 2021-11-11 DIAGNOSIS — Z803 Family history of malignant neoplasm of breast: Secondary | ICD-10-CM | POA: Insufficient documentation

## 2021-11-11 DIAGNOSIS — I10 Essential (primary) hypertension: Secondary | ICD-10-CM | POA: Diagnosis not present

## 2021-11-11 DIAGNOSIS — Z808 Family history of malignant neoplasm of other organs or systems: Secondary | ICD-10-CM | POA: Diagnosis not present

## 2021-11-11 LAB — CANCER ANTIGEN 19-9: CA 19-9: 131 U/mL — ABNORMAL HIGH (ref 0–35)

## 2021-11-11 MED ORDER — LORAZEPAM 0.5 MG PO TABS: 0.5000 mg | ORAL_TABLET | ORAL | 0 refills | Status: DC | PRN

## 2021-11-11 NOTE — Patient Instructions (Addendum)
[place] at [facility] Discharge Instructions  You were seen and examined today by Dr. Delton Coombes. Dr. Delton Coombes is a medical oncologist, meaning that he specializes in the treatment of cancer diagnoses. Dr. Delton Coombes discussed your past medical history, family history of cancers, and the events that led to you being here today.  You were referred to Dr. Delton Coombes by Dr. Rush Landmark due to an abnormal biopsy that revealed adenocarcinoma in your pancreas. This is a common type of pancreatic cancer.  Dr. Delton Coombes has recommended a PET scan. A PET scan is a whole body CT scan that illuminates where there is cancer presence in the body. This will accurately identify the stage of your cancer. To get a clear picture of the pancreas, Dr. Delton Coombes has also recommended a MRI of your pancreas.  Dr. Delton Coombes has also recommended that you meet with a genetic counselor to see if there is any genetic predisposition to cancer.  Follow-up as scheduled.   Thank you for choosing [place] at [facility] to provide your oncology and hematology care.  To afford each patient quality time with our provider, please arrive at least 15 minutes before your scheduled appointment time.   If you have a lab appointment with the [facility] please come in thru the Main Entrance and check in at the main information desk.  You need to re-schedule your appointment should you arrive 10 or more minutes late.  We strive to give you quality time with our providers, and arriving late affects you and other patients whose appointments are after yours.  Also, if you no show three or more times for appointments you may be dismissed from the clinic at the providers discretion.     Again, thank you for choosing [facility].  Our hope is that these requests will decrease the amount of time that you wait before being seen by our physicians.        _____________________________________________________________  Should you have questions after your visit to [facility], please contact our office at [phone] and follow the prompts.  Our office hours are 8:00 a.m. and 4:30 p.m. Monday - Friday.  Please note that voicemails left after 4:00 p.m. may not be returned until the following business day.  We are closed weekends and major holidays.  You do have access to a nurse 24-7, just call the main number to the clinic [phone] and do not press any options, hold on the line and a nurse will answer the phone.    For prescription refill requests, have your pharmacy contact our office and allow 72 hours.    Due to Covid, you will need to wear a mask upon entering the hospital. If you do not have a mask, a mask will be given to you at the Main Entrance upon arrival. For doctor visits, patients may have 1 support person age 61 or older with them. For treatment visits, patients can not have anyone with them due to social distancing guidelines and our immunocompromised population.

## 2021-11-11 NOTE — Progress Notes (Signed)
[place] [address]  [address]   CLINIC:  Medical Oncology/Hematology  CONSULT NOTE  Patient Care Team: Abigail Miyamoto, [place] as PCP - General (Family Medicine) Derek Jack, MD as Medical Oncologist (Medical Oncology) Brien Mates, RN as Oncology Nurse Navigator (Medical Oncology)  CHIEF COMPLAINTS/PURPOSE OF CONSULTATION:  Evaluation for pancreatic adenocarcinoma  HISTORY OF PRESENTING ILLNESS:  [name] 87 y.o. female is here because of evaluation for pancreatic adenocarcinoma, at the request of [facility].  Today she reports feeling well. She has lost 13 lbs in the past 2 months. She reports jaundice since the day before Easter this year. She reports her appetite has slightly decreased, but it has improved over the past 2 weeks. She reports occasional CP which she reports feels like indigestion. She denies abdominal pain. She eats 2 meals daily with snacks. She denies diarrhea. She had a pacemaker placed in January.   She currently lives at home with her husband. She stopped driving in January due to increased fatigue. She takes care of her husband who has dementia. 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 She denies smoking history and chemical history. Her daughter had a glioblastoma, her mother had ovarian cancer, and her maternal aunt had breast cancer in her fifties.   MEDICAL HISTORY:  Past Medical History:  Diagnosis Date   Essential tremor    GERD (gastroesophageal reflux disease)    High cholesterol    Hypertension    PONV (postoperative nausea and vomiting)    Vertigo     SURGICAL HISTORY: Past Surgical History:  Procedure Laterality Date   BILIARY BRUSHING  09/10/2021   Procedure: BILIARY BRUSHING;  Surgeon: Irene Shipper, MD;  Location: [facility] ENDOSCOPY;  Service: Gastroenterology;;   BILIARY BRUSHING  10/29/2021   Procedure: BILIARY BRUSHING;  Surgeon: Irving Copas., MD;  Location: [facility] ENDOSCOPY;   Service: Gastroenterology;;   BILIARY DILATION  10/29/2021   Procedure: BILIARY DILATION;  Surgeon: Irving Copas., MD;  Location: [facility] ENDOSCOPY;  Service: Gastroenterology;;   BILIARY STENT PLACEMENT  09/10/2021   Procedure: BILIARY STENT PLACEMENT;  Surgeon: Irene Shipper, MD;  Location: [facility] ENDOSCOPY;  Service: Gastroenterology;;   BILIARY STENT PLACEMENT N/A 10/03/2021   Procedure: BILIARY STENT PLACEMENT;  Surgeon: Gatha Mayer, MD;  Location: [facility] ENDOSCOPY;  Service: Gastroenterology;  Laterality: N/A;   BILIARY STENT PLACEMENT N/A 10/29/2021   Procedure: BILIARY STENT PLACEMENT;  Surgeon: Rush Landmark Telford Nab., MD;  Location: [facility] ENDOSCOPY;  Service: Gastroenterology;  Laterality: N/A;   BIOPSY  10/29/2021   Procedure: BIOPSY;  Surgeon: Rush Landmark Telford Nab., MD;  Location: [facility] ENDOSCOPY;  Service: Gastroenterology;;   ENDOSCOPIC RETROGRADE CHOLANGIOPANCREATOGRAPHY (ERCP) WITH PROPOFOL N/A 10/29/2021   Procedure: ENDOSCOPIC RETROGRADE CHOLANGIOPANCREATOGRAPHY (ERCP) WITH PROPOFOL;  Surgeon: Irving Copas., MD;  Location: [facility] ENDOSCOPY;  Service: Gastroenterology;  Laterality: N/A;   ERCP N/A 09/10/2021   Procedure: ENDOSCOPIC RETROGRADE CHOLANGIOPANCREATOGRAPHY (ERCP);  Surgeon: Irene Shipper, MD;  Location: [place];  Service: Gastroenterology;  Laterality: N/A;   ERCP N/A 10/03/2021   Procedure: ENDOSCOPIC RETROGRADE CHOLANGIOPANCREATOGRAPHY (ERCP);  Surgeon: Gatha Mayer, MD;  Location: [facility] ENDOSCOPY;  Service: Gastroenterology;  Laterality: N/A;   ESOPHAGOGASTRODUODENOSCOPY (EGD) WITH PROPOFOL N/A 10/29/2021   Procedure: ESOPHAGOGASTRODUODENOSCOPY (EGD) WITH PROPOFOL;  Surgeon: Rush Landmark Telford Nab., MD;  Location: [facility] ENDOSCOPY;  Service: Gastroenterology;  Laterality: N/A;   EUS N/A 10/29/2021   Procedure: UPPER ENDOSCOPIC ULTRASOUND (EUS) RADIAL;  Surgeon: Irving Copas., MD;  Location: [facility] ENDOSCOPY;  Service: Gastroenterology;  Laterality: N/A;   FINE  NEEDLE ASPIRATION  10/29/2021   Procedure: FINE NEEDLE ASPIRATION (FNA) LINEAR;  Surgeon: Irving Copas., MD;  Location: [facility] ENDOSCOPY;  Service: Gastroenterology;;   PACEMAKER IMPLANT     REMOVAL OF STONES  10/29/2021   Procedure: REMOVAL OF SLUDGE;  Surgeon: Irving Copas., MD;  Location: [facility] ENDOSCOPY;  Service: Gastroenterology;;   Joan Mayans  09/10/2021   Procedure: Joan Mayans;  Surgeon: Irene Shipper, MD;  Location: [facility] ENDOSCOPY;  Service: Gastroenterology;;   Bess Kinds CHOLANGIOSCOPY N/A 10/29/2021   Procedure: [id number] CHOLANGIOSCOPY;  Surgeon: Irving Copas., MD;  Location: [facility] ENDOSCOPY;  Service: Gastroenterology;  Laterality: N/A;   STENT REMOVAL  10/03/2021   Procedure: STENT REMOVAL;  Surgeon: Gatha Mayer, MD;  Location: [facility] ENDOSCOPY;  Service: Gastroenterology;;    SOCIAL HISTORY: Social History   Socioeconomic History   Marital status: Married    Spouse name: Not on file   Number of children: Not on file   Years of education: Not on file   Highest education level: Not on file  Occupational History   Not on file  Tobacco Use   Smoking status: Never   Smokeless tobacco: Never  Vaping Use   Vaping Use: Never used  Substance and Sexual Activity   Alcohol use: Never   Drug use: Never   Sexual activity: Not on file  Other Topics Concern   Not on file  Social History Narrative   Not on file   Social Determinants of Health   Financial Resource Strain: Not on file  Food Insecurity: Not on file  Transportation Needs: Not on file  Physical Activity: Not on file  Stress: Not on file  Social Connections: Not on file  Intimate Partner Violence: Not on file    FAMILY HISTORY: Family History  Problem Relation Age of Onset   Stomach cancer Neg Hx    Esophageal cancer Neg Hx    Colon cancer Neg Hx    Pancreatic cancer Neg Hx     ALLERGIES:  is allergic to ivp dye [iodinated contrast media] and lisinopril.  MEDICATIONS:  Current  Outpatient Medications  Medication Sig Dispense Refill   amLODipine (NORVASC) 2.5 MG tablet Take 2.5 mg by mouth at bedtime.     aspirin EC 81 MG tablet Take 81 mg by mouth daily. Swallow whole.     Cholecalciferol (VITAMIN D3) 50 MCG (2000 UT) TABS Take 2,000 Units by mouth daily after supper.     Coenzyme Q10 (CO Q-10) 100 MG CAPS Take 100 mg by mouth every evening.     famotidine (PEPCID) 40 MG tablet Take 40 mg by mouth every evening.     gabapentin (NEURONTIN) 100 MG capsule Take 100 mg by mouth 2 (two) times daily.     LORazepam (ATIVAN) 0.5 MG tablet Take 1 tablet (0.5 mg total) by mouth as needed for anxiety. Take one hour prior to MRI and again 30 min before if needed. 3 tablet 0   meclizine (ANTIVERT) 25 MG tablet Take 25 mg by mouth daily as needed for dizziness.     Misc Natural Products (JOINT SUPPORT) CAPS Take 1 capsule by mouth daily with breakfast.     Multiple Vitamin (MULTIVITAMIN) tablet Take 1 tablet by mouth daily with breakfast.     Multiple Vitamins-Minerals (OCUVITE EYE HEALTH FORMULA) CAPS Take 1 capsule by mouth daily.     nebivolol (BYSTOLIC) 5 MG tablet Take 2.5 mg by  mouth in the morning.     omeprazole (PRILOSEC) 40 MG capsule Take 40 mg by mouth daily before breakfast.     ondansetron (ZOFRAN-ODT) 4 MG disintegrating tablet Take 4 mg by mouth every 8 (eight) hours as needed for nausea or vomiting.     polyethylene glycol (MIRALAX) 17 g packet Take 17 g by mouth daily as needed. (Patient taking differently: Take 17 g by mouth daily as needed for moderate constipation.) 14 each 0   pravastatin (PRAVACHOL) 40 MG tablet pravastatin 40 mg tablet  TAKE 1 TABLET BY MOUTH EVERY DAY     primidone (MYSOLINE) 50 MG tablet Take 25 mg by mouth daily as needed (for tremors).     pyridOXINE (VITAMIN B-6) 100 MG tablet Take 50 mg by mouth in the morning.     tiZANidine (ZANAFLEX) 2 MG tablet Take 1 mg by mouth in the morning.     No current facility-administered medications for  this visit.    REVIEW OF SYSTEMS:   Review of Systems  Constitutional:  Positive for appetite change, fatigue and unexpected weight change (-13 lbs).  Cardiovascular:  Positive for chest pain and palpitations.  Gastrointestinal:  Negative for abdominal pain and diarrhea.  Psychiatric/Behavioral:  Positive for sleep disturbance.   All other systems reviewed and are negative.    PHYSICAL EXAMINATION: ECOG PERFORMANCE STATUS: 1 - Symptomatic but completely ambulatory  Vitals:   11/11/21 1348  BP: (!) 150/86  Pulse: 71  Resp: 18  Temp: 98.8 F (37.1 C)  SpO2: 100%   Filed Weights   11/11/21 1348  Weight: 109 lb 8 oz (49.7 kg)   Physical Exam Vitals reviewed.  Constitutional:      Appearance: Normal appearance.     Comments: In wheelchair  Cardiovascular:     Rate and Rhythm: Normal rate and regular rhythm.     Pulses: Normal pulses.     Heart sounds: Normal heart sounds.  Pulmonary:     Effort: Pulmonary effort is normal.     Breath sounds: Normal breath sounds.  Abdominal:     Palpations: Abdomen is soft. There is no hepatomegaly, splenomegaly or mass.     Tenderness: There is no abdominal tenderness.  Musculoskeletal:     Right lower leg: No edema.     Left lower leg: No edema.  Lymphadenopathy:     Cervical: No cervical adenopathy.     Right cervical: No superficial, deep or posterior cervical adenopathy.    Left cervical: No superficial, deep or posterior cervical adenopathy.     Upper Body:     Right upper body: No supraclavicular or axillary adenopathy.     Left upper body: No supraclavicular or axillary adenopathy.     Lower Body: No right inguinal adenopathy. No left inguinal adenopathy.  Neurological:     General: No focal deficit present.     Mental Status: She is alert and oriented to person, place, and time.  Psychiatric:        Mood and Affect: Mood normal.        Behavior: Behavior normal.      LABORATORY DATA:  I have reviewed the data as  listed    Latest Ref Rng & Units 11/10/2021    2:24 PM 10/04/2021    6:12 AM 10/03/2021    1:21 PM  CBC  WBC 4.0 - 10.5 K/uL 7.8  13.4  20.3   Hemoglobin 12.0 - 15.0 g/dL 10.8  9.3  11.6   Hematocrit  36.0 - 46.0 % 33.4  27.2  34.1   Platelets 150 - 400 K/uL 394  310  418       Latest Ref Rng & Units 11/10/2021    2:24 PM 10/23/2021    3:34 PM 10/08/2021    3:52 PM  CMP  Glucose 70 - 99 mg/dL 113  129  118   BUN 8 - 23 mg/dL '27  21  22   '$ Creatinine 0.44 - 1.00 mg/dL 1.59  1.30  1.27   Sodium 135 - 145 mmol/L 136  134  132   Potassium 3.5 - 5.1 mmol/L 4.7  4.0  4.1   Chloride 98 - 111 mmol/L 104  103  98   CO2 22 - 32 mmol/L '25  26  26   '$ Calcium 8.9 - 10.3 mg/dL 10.3  9.1  9.2   Total Protein 6.5 - 8.1 g/dL 6.9  6.3  6.1   Total Bilirubin 0.3 - 1.2 mg/dL 1.4  2.4  5.3   Alkaline Phos 38 - 126 U/L 223  530  439   AST 15 - 41 U/L 29  37  64   ALT 0 - 44 U/L 29  74  102     RADIOGRAPHIC STUDIES: I have personally reviewed the radiological images as listed and agreed with the findings in the report. DG ERCP  Result Date: 10/29/2021 CLINICAL DATA:  ERCP EXAM: ERCP TECHNIQUE: Multiple spot images obtained with the fluoroscopic device and submitted for interpretation post-procedure. COMPARISON:  ERCP-10/03/2021; 09/10/2021; MRCP-09/08/2021 FLUOROSCOPY TIME: FLUOROSCOPY TIME 3 minutes, 47 seconds (40.4 mGy) FINDINGS: Sixteen spot fluoroscopic images the right upper abdominal quadrant during ERCP are provided for review. Initial image demonstrates an ERCP probe overlying the right upper abdominal quadrant. Pre-existing plastic biliary stent overlies expected location of the CBD Subsequent images demonstrate removal of the plastic internal biliary stent with subsequent cannulation and opacification of the CBD. There is marked distension of the central aspect of the CBD with narrowing/subtotal occlusion of its distal aspect with shouldering. Subsequent images demonstrate placement of an internal  biliary stent with subsequent biliary plasty. IMPRESSION: ERCP with biliary stent exchange and biliary plasty as detailed above. These images were submitted for radiologic interpretation only. Please see the procedural report for the amount of contrast and the fluoroscopy time utilized. Electronically Signed   By: Sandi Mariscal M.D.   On: 10/29/2021 14:39   DG C-Arm 1-60 Min-No Report  Result Date: 10/29/2021 Fluoroscopy was utilized by the requesting physician.  No radiographic interpretation.   DG C-Arm 1-60 Min-No Report  Result Date: 10/29/2021 Fluoroscopy was utilized by the requesting physician.  No radiographic interpretation.    ASSESSMENT:  T2 N0 M1 pancreatic adenocarcinoma: - She presented with painless jaundice in April 2023.  Weight loss of 13 pounds since April. - Stent placement on 09/10/2021 for a bilirubin of 8.2. - 10/03/2021: ERCP and stent removal and stent placement for stent dysfunction with total bilirubin 16.3. - 10/29/2021: ERCP/EUS: Lower CBD stent stenosis from stricture, status post metal stent placement.  EUS showed mass in the superior pancreatic head measuring 2.8 x 2.2 cm, suggesting abutment of the portal vein.  Intact interface was seen between lesion and SMA and celiac trunk suggesting lack of invasion.  Hypoechoic 14 x 12 mm lesion visualized in the liver.  Biopsy could not be attempted due to presence of blood vessel. - She had syncopal episodes in January, had pacemaker placement for bradycardia. - Pathology: Pancreatic head FNA  and biliary stricture brushing: Malignant cells consistent with adenocarcinoma. - CA 19-9: 131   Social/family history: - She lives at home with her husband who has dementia.  She worked as a Charity fundraiser for 33 years.  Non-smoker and no exposure to chemicals.  She has not been driving since 27-Jun-2022. - Daughter died of glioblastoma.  Mother had ovarian cancer.  Maternal aunt had breast cancer.   PLAN:  T2 N0 M1 pancreatic  adenocarcinoma: - We have reviewed pathology reports and imaging findings with the patient in detail. - CT CAP without contrast on 11/10/2021 images were reviewed.  Hypodense lesions in the liver, difficult to evaluate given intrahepatic biliary dilatation. - Recommend whole-body PET CT scan. - Recommend MRI of the abdomen pancreatic protocol.  Patient has contrast allergy to CT dye.  She will have to have a pacemaker deactivated at the time of MRI. - I will give her Ativan 0.5 mg to be taken prior to MRI for claustrophobia. - I have recommended genetic testing. - RTC after scans.  2.  Weight loss: - She has lost 13 pounds since April. - Denies any nausea or vomiting or abdominal pain.  She is eating 2 meals per day.  She eats snacks in between.  Denies any diarrhea.  We will closely monitor.   All questions were answered. The patient knows to call the clinic with any problems, questions or concerns.   Derek Jack, MD, 11/11/21 5:49 PM  [place] [phone]   I, Thana Ates, am acting as a scribe for Dr. Derek Jack.  I, Derek Jack MD, have reviewed the above documentation for accuracy and completeness, and I agree with the above.

## 2021-11-20 ENCOUNTER — Encounter (HOSPITAL_COMMUNITY)
Admission: RE | Admit: 2021-11-20 | Discharge: 2021-11-20 | Disposition: A | Discharge status: Home / Self Care | Payer: Medicare HMO | Source: Ambulatory Visit | Attending: Hematology | Admitting: Hematology

## 2021-11-20 DIAGNOSIS — C259 Malignant neoplasm of pancreas, unspecified: Secondary | ICD-10-CM | POA: Diagnosis present

## 2021-11-20 MED ORDER — FLUDEOXYGLUCOSE F - 18 (FDG) INJECTION
6.1400 | Freq: Once | INTRAVENOUS | Status: AC | PRN
  Administered 2021-11-20: 6.14 via INTRAVENOUS

## 2021-12-17 ENCOUNTER — Other Ambulatory Visit (HOSPITAL_COMMUNITY): Payer: Self-pay | Admitting: *Deleted

## 2021-12-18 ENCOUNTER — Other Ambulatory Visit (HOSPITAL_COMMUNITY): Payer: Self-pay | Admitting: *Deleted

## 2021-12-18 MED ORDER — LORAZEPAM 0.5 MG PO TABS: 0.5000 mg | ORAL_TABLET | ORAL | 0 refills | Status: DC | PRN

## 2021-12-18 MED ORDER — LORAZEPAM 1 MG PO TABS: 0.5000 mg | ORAL_TABLET | ORAL | 0 refills | Status: DC | PRN

## 2021-12-24 ENCOUNTER — Ambulatory Visit (HOSPITAL_COMMUNITY)
Admission: RE | Admit: 2021-12-24 | Discharge: 2021-12-24 | Disposition: A | Discharge status: Home / Self Care | Payer: Medicare HMO | Source: Ambulatory Visit | Attending: Hematology | Admitting: Hematology

## 2021-12-24 DIAGNOSIS — C259 Malignant neoplasm of pancreas, unspecified: Secondary | ICD-10-CM | POA: Diagnosis present

## 2021-12-24 MED ORDER — GADOBUTROL 1 MMOL/ML IV SOLN
5.0000 mL | Freq: Once | INTRAVENOUS | Status: AC | PRN
  Administered 2021-12-24: 5 mL via INTRAVENOUS

## 2021-12-24 NOTE — Progress Notes (Signed)
Patient here today at [facility] for MRI Abdomen W WO contrast. 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 Orders for DOO 85. Will re-program once scan is completed.

## 2021-12-30 ENCOUNTER — Ambulatory Visit (HOSPITAL_COMMUNITY): Payer: Medicare HMO | Admitting: Hematology

## 2022-01-01 ENCOUNTER — Inpatient Hospital Stay: Payer: Medicare HMO | Attending: Hematology | Admitting: Hematology

## 2022-01-01 VITALS — BP 167/74 | HR 74 | Temp 98.0°F | Resp 18 | Wt 111.1 lb

## 2022-01-01 DIAGNOSIS — Z8041 Family history of malignant neoplasm of ovary: Secondary | ICD-10-CM | POA: Diagnosis not present

## 2022-01-01 DIAGNOSIS — C250 Malignant neoplasm of head of pancreas: Secondary | ICD-10-CM | POA: Diagnosis present

## 2022-01-01 DIAGNOSIS — R918 Other nonspecific abnormal finding of lung field: Secondary | ICD-10-CM | POA: Insufficient documentation

## 2022-01-01 DIAGNOSIS — K7689 Other specified diseases of liver: Secondary | ICD-10-CM | POA: Insufficient documentation

## 2022-01-01 DIAGNOSIS — C259 Malignant neoplasm of pancreas, unspecified: Secondary | ICD-10-CM | POA: Diagnosis not present

## 2022-01-01 DIAGNOSIS — Z803 Family history of malignant neoplasm of breast: Secondary | ICD-10-CM | POA: Insufficient documentation

## 2022-01-01 DIAGNOSIS — Z950 Presence of cardiac pacemaker: Secondary | ICD-10-CM | POA: Insufficient documentation

## 2022-01-01 DIAGNOSIS — I10 Essential (primary) hypertension: Secondary | ICD-10-CM | POA: Diagnosis not present

## 2022-01-01 NOTE — Patient Instructions (Signed)
[place] at [facility] Discharge Instructions  You were seen and examined today by Dr. Delton Coombes.  Dr. Delton Coombes discussed your most recent lab work and scans which revealed that the cancer has not spread. Dr. Delton Coombes is going to refer you to Dr. Zenia Resides for possible surgery.    Follow-up as scheduled in 3 weeks.    Thank you for choosing [place] at [facility] to provide your oncology and hematology care.  To afford each patient quality time with our provider, please arrive at least 15 minutes before your scheduled appointment time.   If you have a lab appointment with the [facility] please come in thru the Main Entrance and check in at the main information desk.  You need to re-schedule your appointment should you arrive 10 or more minutes late.  We strive to give you quality time with our providers, and arriving late affects you and other patients whose appointments are after yours.  Also, if you no show three or more times for appointments you may be dismissed from the clinic at the providers discretion.     Again, thank you for choosing [facility].  Our hope is that these requests will decrease the amount of time that you wait before being seen by our physicians.       _____________________________________________________________  Should you have questions after your visit to [facility], please contact our office at [phone] and follow the prompts.  Our office hours are 8:00 a.m. and 4:30 p.m. Monday - Friday.  Please note that voicemails left after 4:00 p.m. may not be returned until the following business day.  We are closed weekends and major holidays.  You do have access to a nurse 24-7, just call the main number to the clinic [phone] and do not press any options, hold on the line and a nurse will answer the phone.    For prescription refill requests, have your pharmacy contact our office  and allow 72 hours.

## 2022-01-01 NOTE — Progress Notes (Signed)
Meagan [name], [place] [address]   CLINIC:  Medical Oncology/Hematology  PCP:  Abigail Miyamoto, [place] / [place] [id number] [phone]   REASON FOR VISIT:  Follow-up for pancreatic adenocarcinoma  PRIOR THERAPY: none  NGS Results: not done  CURRENT THERAPY: under work-up  BRIEF ONCOLOGIC HISTORY:  Oncology History   No history exists.    CANCER STAGING: Cancer Staging  Pancreatic carcinoma [facility]) Staging form: Exocrine Pancreas, AJCC 8th Edition - Clinical stage from 11/11/2021: Stage IV (cT2, cN0, cM1) - Unsigned   INTERVAL HISTORY:  [name], a 87 y.o. female, returns for routine follow-up of her pancreatic adenocarcinoma. Meagan [name] was last seen on 11/11/2021.   Today she reports feeling good. She is eating well, and drinking 1 Boost daily. Her activity levels have increased. She denies abdominal pain.   REVIEW OF SYSTEMS:  Review of Systems  Constitutional:  Negative for appetite change, fatigue and unexpected weight change.  Gastrointestinal:  Negative for abdominal pain.  Neurological:  Positive for numbness.  All other systems reviewed and are negative.   PAST MEDICAL/SURGICAL HISTORY:  Past Medical History:  Diagnosis Date   Essential tremor    GERD (gastroesophageal reflux disease)    High cholesterol    Hypertension    PONV (postoperative nausea and vomiting)    Vertigo    Past Surgical History:  Procedure Laterality Date   BILIARY BRUSHING  09/10/2021   Procedure: BILIARY BRUSHING;  Surgeon: Irene Shipper, MD;  Location: [facility] ENDOSCOPY;  Service: Gastroenterology;;   BILIARY BRUSHING  10/29/2021   Procedure: BILIARY BRUSHING;  Surgeon: Irving Copas., MD;  Location: [facility] ENDOSCOPY;  Service: Gastroenterology;;   BILIARY DILATION  10/29/2021   Procedure: BILIARY DILATION;  Surgeon: Irving Copas., MD;  Location: [facility] ENDOSCOPY;  Service: Gastroenterology;;   BILIARY  STENT PLACEMENT  09/10/2021   Procedure: BILIARY STENT PLACEMENT;  Surgeon: Irene Shipper, MD;  Location: [facility] ENDOSCOPY;  Service: Gastroenterology;;   BILIARY STENT PLACEMENT N/A 10/03/2021   Procedure: BILIARY STENT PLACEMENT;  Surgeon: Gatha Mayer, MD;  Location: [facility] ENDOSCOPY;  Service: Gastroenterology;  Laterality: N/A;   BILIARY STENT PLACEMENT N/A 10/29/2021   Procedure: BILIARY STENT PLACEMENT;  Surgeon: Rush Landmark Telford Nab., MD;  Location: [facility] ENDOSCOPY;  Service: Gastroenterology;  Laterality: N/A;   BIOPSY  10/29/2021   Procedure: BIOPSY;  Surgeon: Rush Landmark Telford Nab., MD;  Location: [facility] ENDOSCOPY;  Service: Gastroenterology;;   ENDOSCOPIC RETROGRADE CHOLANGIOPANCREATOGRAPHY (ERCP) WITH PROPOFOL N/A 10/29/2021   Procedure: ENDOSCOPIC RETROGRADE CHOLANGIOPANCREATOGRAPHY (ERCP) WITH PROPOFOL;  Surgeon: Irving Copas., MD;  Location: [facility] ENDOSCOPY;  Service: Gastroenterology;  Laterality: N/A;   ERCP N/A 09/10/2021   Procedure: ENDOSCOPIC RETROGRADE CHOLANGIOPANCREATOGRAPHY (ERCP);  Surgeon: Irene Shipper, MD;  Location: [place];  Service: Gastroenterology;  Laterality: N/A;   ERCP N/A 10/03/2021   Procedure: ENDOSCOPIC RETROGRADE CHOLANGIOPANCREATOGRAPHY (ERCP);  Surgeon: Gatha Mayer, MD;  Location: [facility] ENDOSCOPY;  Service: Gastroenterology;  Laterality: N/A;   ESOPHAGOGASTRODUODENOSCOPY (EGD) WITH PROPOFOL N/A 10/29/2021   Procedure: ESOPHAGOGASTRODUODENOSCOPY (EGD) WITH PROPOFOL;  Surgeon: Rush Landmark Telford Nab., MD;  Location: [facility] ENDOSCOPY;  Service: Gastroenterology;  Laterality: N/A;   EUS N/A 10/29/2021   Procedure: UPPER ENDOSCOPIC ULTRASOUND (EUS) RADIAL;  Surgeon: Irving Copas., MD;  Location: [facility] ENDOSCOPY;  Service: Gastroenterology;  Laterality: N/A;   FINE NEEDLE ASPIRATION  10/29/2021   Procedure: FINE NEEDLE ASPIRATION (FNA) LINEAR;  Surgeon: Irving Copas., MD;  Location: [facility] ENDOSCOPY;  Service: Gastroenterology;;   PACEMAKER IMPLANT      REMOVAL OF STONES  10/29/2021   Procedure: REMOVAL OF SLUDGE;  Surgeon: Rush Landmark Telford Nab., MD;  Location: [facility] ENDOSCOPY;  Service: Gastroenterology;;   Joan Mayans  09/10/2021   Procedure: Joan Mayans;  Surgeon: Irene Shipper, MD;  Location: [facility] ENDOSCOPY;  Service: Gastroenterology;;   Bess Kinds CHOLANGIOSCOPY N/A 10/29/2021   Procedure: KGYJEHUD CHOLANGIOSCOPY;  Surgeon: Irving Copas., MD;  Location: [facility] ENDOSCOPY;  Service: Gastroenterology;  Laterality: N/A;   STENT REMOVAL  10/03/2021   Procedure: STENT REMOVAL;  Surgeon: Gatha Mayer, MD;  Location: [facility] ENDOSCOPY;  Service: Gastroenterology;;    SOCIAL HISTORY:  Social History   Socioeconomic History   Marital status: Married    Spouse name: Not on file   Number of children: Not on file   Years of education: Not on file   Highest education level: Not on file  Occupational History   Not on file  Tobacco Use   Smoking status: Never   Smokeless tobacco: Never  Vaping Use   Vaping Use: Never used  Substance and Sexual Activity   Alcohol use: Never   Drug use: Never   Sexual activity: Not on file  Other Topics Concern   Not on file  Social History Narrative   Not on file   Social Determinants of Health   Financial Resource Strain: Not on file  Food Insecurity: Not on file  Transportation Needs: Not on file  Physical Activity: Not on file  Stress: Not on file  Social Connections: Not on file  Intimate Partner Violence: Not on file    FAMILY HISTORY:  Family History  Problem Relation Age of Onset   Stomach cancer Neg Hx    Esophageal cancer Neg Hx    Colon cancer Neg Hx    Pancreatic cancer Neg Hx     CURRENT MEDICATIONS:  Current Outpatient Medications  Medication Sig Dispense Refill   amLODipine (NORVASC) 2.5 MG tablet Take 2.5 mg by mouth at bedtime.     aspirin EC 81 MG tablet Take 81 mg by mouth daily. Swallow whole.     Cholecalciferol (VITAMIN D3) 50 MCG (2000 UT) TABS Take 2,000 Units  by mouth daily after supper.     Coenzyme Q10 (CO Q-10) 100 MG CAPS Take 100 mg by mouth every evening.     famotidine (PEPCID) 40 MG tablet Take 40 mg by mouth every evening.     gabapentin (NEURONTIN) 100 MG capsule Take 100 mg by mouth 2 (two) times daily.     LORazepam (ATIVAN) 1 MG tablet Take 0.5 tablets (0.5 mg total) by mouth as needed for anxiety. Take one hour prior to MRI and again 30 min before if needed. 2 tablet 0   meclizine (ANTIVERT) 25 MG tablet Take 25 mg by mouth daily as needed for dizziness.     Misc Natural Products (JOINT SUPPORT) CAPS Take 1 capsule by mouth daily with breakfast.     Multiple Vitamin (MULTIVITAMIN) tablet Take 1 tablet by mouth daily with breakfast.     Multiple Vitamins-Minerals (OCUVITE EYE HEALTH FORMULA) CAPS Take 1 capsule by mouth daily.     nebivolol (BYSTOLIC) 5 MG tablet Take 2.5 mg by mouth in the morning.     omeprazole (PRILOSEC) 40 MG capsule Take 40 mg by mouth daily before breakfast.     ondansetron (ZOFRAN-ODT) 4 MG disintegrating tablet Take 4 mg by mouth every 8 (eight) hours as needed for nausea or  vomiting.     polyethylene glycol (MIRALAX) 17 g packet Take 17 g by mouth daily as needed. (Patient taking differently: Take 17 g by mouth daily as needed for moderate constipation.) 14 each 0   pravastatin (PRAVACHOL) 40 MG tablet pravastatin 40 mg tablet  TAKE 1 TABLET BY MOUTH EVERY DAY     primidone (MYSOLINE) 50 MG tablet Take 25 mg by mouth daily as needed (for tremors).     pyridOXINE (VITAMIN B-6) 100 MG tablet Take 50 mg by mouth in the morning.     tiZANidine (ZANAFLEX) 2 MG tablet Take 1 mg by mouth in the morning.     No current facility-administered medications for this visit.    ALLERGIES:  Allergies  Allergen Reactions   Ivp Dye [Iodinated Contrast Media] Hives   Lisinopril Cough    PHYSICAL EXAM:  Performance status (ECOG): 1 - Symptomatic but completely ambulatory  There were no vitals filed for this visit. Wt  Readings from Last 3 Encounters:  11/11/21 109 lb 8 oz (49.7 kg)  10/03/21 117 lb (53.1 kg)  09/26/21 116 lb (52.6 kg)   Physical Exam Vitals reviewed.  Constitutional:      Appearance: Normal appearance.  Cardiovascular:     Rate and Rhythm: Normal rate and regular rhythm.     Pulses: Normal pulses.     Heart sounds: Normal heart sounds.  Pulmonary:     Effort: Pulmonary effort is normal.     Breath sounds: Normal breath sounds.  Neurological:     General: No focal deficit present.     Mental Status: She is alert and oriented to person, place, and time.  Psychiatric:        Mood and Affect: Mood normal.        Behavior: Behavior normal.      LABORATORY DATA:  I have reviewed the labs as listed.     Latest Ref Rng & Units 11/10/2021    2:24 PM 10/04/2021    6:12 AM 10/03/2021    1:21 PM  CBC  WBC 4.0 - 10.5 K/uL 7.8  13.4  20.3   Hemoglobin 12.0 - 15.0 g/dL 10.8  9.3  11.6   Hematocrit 36.0 - 46.0 % 33.4  27.2  34.1   Platelets 150 - 400 K/uL 394  310  418       Latest Ref Rng & Units 11/10/2021    2:24 PM 10/23/2021    3:34 PM 10/08/2021    3:52 PM  CMP  Glucose 70 - 99 mg/dL 113  129  118   BUN 8 - 23 mg/dL '27  21  22   '$ Creatinine 0.44 - 1.00 mg/dL 1.59  1.30  1.27   Sodium 135 - 145 mmol/L 136  134  132   Potassium 3.5 - 5.1 mmol/L 4.7  4.0  4.1   Chloride 98 - 111 mmol/L 104  103  98   CO2 22 - 32 mmol/L '25  26  26   '$ Calcium 8.9 - 10.3 mg/dL 10.3  9.1  9.2   Total Protein 6.5 - 8.1 g/dL 6.9  6.3  6.1   Total Bilirubin 0.3 - 1.2 mg/dL 1.4  2.4  5.3   Alkaline Phos 38 - 126 U/L 223  530  439   AST 15 - 41 U/L 29  37  64   ALT 0 - 44 U/L 29  74  102     DIAGNOSTIC IMAGING:  I have independently reviewed  the scans and discussed with the patient. MR Abdomen W Wo Contrast  Result Date: 12/25/2021 CLINICAL DATA:  Follow-up pancreatic cancer. EXAM: MRI ABDOMEN WITHOUT AND WITH CONTRAST TECHNIQUE: Multiplanar multisequence MR imaging of the abdomen was performed both  before and after the administration of intravenous contrast. CONTRAST:  50m GADAVIST GADOBUTROL 1 MMOL/ML IV SOLN COMPARISON:  Multiple previous imaging examinations. The most recent MRI examination is 09/08/2021. Recent PET-CT 11/20/2021 FINDINGS: Limited examination due to breathing motion artifact. The patient had a hard time holding her breath. Lower chest: The lung bases are grossly clear and stable. Hepatobiliary: Stable small scattered hepatic cysts. No worrisome hepatic lesions to suggest metastatic disease. No intrahepatic biliary dilatation. There is a common bile duct stent in place. Pancreas: The pancreatic head mass measures approximately 2.7 x 2.3 cm and previously measured 2.5 x 1.8 cm. The mass is abutting and slightly displacing the SMA. No obvious fat plane is demonstrated. This may be better evaluated with CT using pancreatic protocol. The portal and splenic veins are patent. Stable dilatation of the main pancreatic duct. Spleen:  Normal size.  No focal lesions. Adrenals/Urinary Tract: Adrenal glands and kidneys are unremarkable. Bilateral renal cysts are stable. Stomach/Bowel: The stomach, duodenum, visualized small bowel and visualized colon are grossly normal. Vascular/Lymphatic: The aorta is normal in caliber. Moderate atherosclerotic calcifications. The branch vessels are patent. The major venous structures are patent. No obvious lymphadenopathy. Other:  No ascites or abdominal wall hernia. Musculoskeletal: No significant bony findings. IMPRESSION: 1. Limited examination due to breathing motion artifact. 2. 2.7 x 2.3 cm pancreatic head mass, previously measured 2.5 x 1.8 cm. The mass is abutting the SMA without obvious fat plane but this would be better evaluated with CT using pancreatic protocol (if the patient is a surgical candidate). 3. Stable small scattered hepatic cysts. No worrisome hepatic lesions to suggest metastatic disease. 4. Stable bilateral renal cysts. 5. Common bile duct  stent in place. No intrahepatic biliary dilatation. Stable dilatation of the main pancreatic duct. Electronically Signed   By: PMarijo SanesM.D.   On: 12/25/2021 09:05     ASSESSMENT:  T2 N0 M1 pancreatic adenocarcinoma: - She presented with painless jaundice in April 2023.  Weight loss of 13 pounds since April. - Stent placement on 09/10/2021 for a bilirubin of 8.2. - 10/03/2021: ERCP and stent removal and stent placement for stent dysfunction with total bilirubin 16.3. - 10/29/2021: ERCP/EUS: Lower CBD stent stenosis from stricture, status post metal stent placement.  EUS showed mass in the superior pancreatic head measuring 2.8 x 2.2 cm, suggesting abutment of the portal vein.  Intact interface was seen between lesion and SMA and celiac trunk suggesting lack of invasion.  Hypoechoic 14 x 12 mm lesion visualized in the liver.  Biopsy could not be attempted due to presence of blood vessel. - She had syncopal episodes in J02/04/24 had pacemaker placement for bradycardia. - Pathology: Pancreatic head FNA and biliary stricture brushing: Malignant cells consistent with adenocarcinoma. - CA 19-9: 131    Social/family history: - She lives at home with her husband who has dementia.  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  Non-smoker and no exposure to chemicals.  She has not been driving since J02-04-24 - Daughter died of glioblastoma.  Mother had ovarian cancer.  Maternal aunt had breast cancer.   PLAN:  T2 N0 M0 pancreatic adenocarcinoma: - We reviewed MRI of the abdomen with and without contrast (12/24/2021): Pancreatic head mass measures 2.7 x  2.3 cm (2.5 x 1.8 cm previously).  Mass is abutting and slightly displacing the SMA.  No obvious fat plane is demonstrated.  Portal and splenic veins are patent.  Stable small scattered hepatic cysts with no liver metastatic disease. - PET scan (11/20/2021): Ill-defined low-level hypermetabolic activity about the metallic CBD stent and pancreatic uncinate  process without discrete CT correlate.  No convincing evidence of hypermetabolic disease in the neck, chest, abdomen or pelvis.  Tiny bilateral lung nodules measuring up to 3 mm. - Latest CA 19-9 on 11/10/2021 improved from 224. - Functionally she is doing better.  She is able to do all her household activities including cooking and cleaning.  She is also taking care of her husband with dementia. - Recommend surgical evaluation by Dr. Zenia Resides. - We will also present her case at tumor board next week.  RTC 3 weeks for follow-up.  2.  Weight loss: - She lost about 13 pounds since April of this year. - She is eating better at this time and has gained 2 pounds since last visit.   Orders placed this encounter:  No orders of the defined types were placed in this encounter.    Derek Jack, MD [place] [phone]   I, Thana Ates, am acting as a scribe for Dr. Derek Jack.  I, Derek Jack MD, have reviewed the above documentation for accuracy and completeness, and I agree with the above.

## 2022-01-02 ENCOUNTER — Other Ambulatory Visit: Payer: Self-pay

## 2022-01-02 DIAGNOSIS — C259 Malignant neoplasm of pancreas, unspecified: Secondary | ICD-10-CM

## 2022-01-02 MED ORDER — PREDNISONE 20 MG PO TABS: ORAL_TABLET | ORAL | 0 refills | Status: DC

## 2022-01-02 NOTE — Progress Notes (Signed)
Message sent by Dr. Margretta Sidle, I have referred this patient to surgeon Dr. Zenia Resides.  She wants [place] to get a CT abdomen with and without contrast, pancreatic protocol.  Diagnosis is pancreatic cancer.  The patient has a iodine dye listed as allergy.  I have called and talked to the patient.  All she had was hives many years ago.  She is okay doing the CT scan with contrast.  She needs a CMP on the day of the scan.  Please order the above-mentioned scan.  Please give her prednisone 20 mg 13 hours, 7 hours and 1 hour prior to appointment.  Also tell her to take Benadryl 25 mg 1 hour prior to appointment.  Thank you.  Orders placed and medication sent per Dr. Tomie China orders.

## 2022-01-08 ENCOUNTER — Encounter (HOSPITAL_COMMUNITY): Payer: Self-pay

## 2022-01-08 ENCOUNTER — Inpatient Hospital Stay: Payer: Medicare HMO

## 2022-01-08 ENCOUNTER — Ambulatory Visit (HOSPITAL_COMMUNITY)
Admission: RE | Admit: 2022-01-08 | Discharge: 2022-01-08 | Disposition: A | Discharge status: Home / Self Care | Payer: Medicare HMO | Source: Ambulatory Visit | Attending: Hematology | Admitting: Hematology

## 2022-01-08 ENCOUNTER — Other Ambulatory Visit: Payer: Self-pay

## 2022-01-08 DIAGNOSIS — C259 Malignant neoplasm of pancreas, unspecified: Secondary | ICD-10-CM

## 2022-01-08 LAB — CBC WITH DIFFERENTIAL/PLATELET
Abs Immature Granulocytes: 0.02 10*3/uL (ref 0.00–0.07)
Basophils Absolute: 0 10*3/uL (ref 0.0–0.1)
Basophils Relative: 0 %
Eosinophils Absolute: 0 10*3/uL (ref 0.0–0.5)
Eosinophils Relative: 0 %
HCT: 34.7 % — ABNORMAL LOW (ref 36.0–46.0)
Hemoglobin: 11.2 g/dL — ABNORMAL LOW (ref 12.0–15.0)
Immature Granulocytes: 0 %
Lymphocytes Relative: 10 %
Lymphs Abs: 0.8 10*3/uL (ref 0.7–4.0)
MCH: 29.9 pg (ref 26.0–34.0)
MCHC: 32.3 g/dL (ref 30.0–36.0)
MCV: 92.8 fL (ref 80.0–100.0)
Monocytes Absolute: 0.3 10*3/uL (ref 0.1–1.0)
Monocytes Relative: 3 %
Neutro Abs: 6.7 10*3/uL (ref 1.7–7.7)
Neutrophils Relative %: 87 %
Platelets: 256 10*3/uL (ref 150–400)
RBC: 3.74 MIL/uL — ABNORMAL LOW (ref 3.87–5.11)
RDW: 15 % (ref 11.5–15.5)
WBC: 7.7 10*3/uL (ref 4.0–10.5)
nRBC: 0 % (ref 0.0–0.2)

## 2022-01-08 LAB — COMPREHENSIVE METABOLIC PANEL
ALT: 36 U/L (ref 0–44)
AST: 32 U/L (ref 15–41)
Albumin: 3.7 g/dL (ref 3.5–5.0)
Alkaline Phosphatase: 134 U/L — ABNORMAL HIGH (ref 38–126)
Anion gap: 7 (ref 5–15)
BUN: 33 mg/dL — ABNORMAL HIGH (ref 8–23)
CO2: 24 mmol/L (ref 22–32)
Calcium: 10.1 mg/dL (ref 8.9–10.3)
Chloride: 105 mmol/L (ref 98–111)
Creatinine, Ser: 1.25 mg/dL — ABNORMAL HIGH (ref 0.44–1.00)
GFR, Estimated: 42 mL/min — ABNORMAL LOW (ref >60)
Glucose, Bld: 159 mg/dL — ABNORMAL HIGH (ref 70–99)
Potassium: 4.1 mmol/L (ref 3.5–5.1)
Sodium: 136 mmol/L (ref 135–145)
Total Bilirubin: 0.7 mg/dL (ref 0.3–1.2)
Total Protein: 7.1 g/dL (ref 6.5–8.1)

## 2022-01-08 LAB — BILIRUBIN, DIRECT: Bilirubin, Direct: 0.1 mg/dL (ref 0.0–0.2)

## 2022-01-08 MED ORDER — DIPHENHYDRAMINE HCL 25 MG PO TABS: 25.0000 mg | ORAL_TABLET | Freq: Once | ORAL | 0 refills | Status: DC

## 2022-01-08 MED ORDER — PREDNISONE 20 MG PO TABS: ORAL_TABLET | ORAL | 0 refills | Status: DC

## 2022-01-08 NOTE — Progress Notes (Signed)
Message received from Meagan [name] in [place], this patient states that she did not know to pick up benadryl or take it for contrast allergy.  She has been rescheduled for Monday at 3. Another prescription will need to called in and it might be better if prescription benadryl is called in too.   Prescriptions sent in per Dr. Tomie China orders.

## 2022-01-10 LAB — CANCER ANTIGEN 19-9: CA 19-9: 91 U/mL — ABNORMAL HIGH (ref 0–35)

## 2022-01-12 ENCOUNTER — Ambulatory Visit (HOSPITAL_COMMUNITY): Admission: RE | Admit: 2022-01-12 | Payer: Medicare HMO | Source: Ambulatory Visit

## 2022-01-14 ENCOUNTER — Other Ambulatory Visit: Payer: Self-pay

## 2022-01-14 NOTE — Progress Notes (Signed)
The proposed treatment discussed in conference is for discussion purpose only and is not a binding recommendation.  The patients have not been physically examined, or presented with their treatment options.  Therefore, final treatment plans cannot be decided.  

## 2022-01-16 ENCOUNTER — Ambulatory Visit (HOSPITAL_COMMUNITY)
Admission: RE | Admit: 2022-01-16 | Discharge: 2022-01-16 | Disposition: A | Discharge status: Home / Self Care | Payer: Medicare HMO | Source: Ambulatory Visit | Attending: Hematology | Admitting: Hematology

## 2022-01-16 DIAGNOSIS — C259 Malignant neoplasm of pancreas, unspecified: Secondary | ICD-10-CM | POA: Diagnosis not present

## 2022-01-16 MED ORDER — IOHEXOL 300 MG/ML  SOLN
75.0000 mL | Freq: Once | INTRAMUSCULAR | Status: AC | PRN
  Administered 2022-01-16: 75 mL via INTRAVENOUS

## 2022-01-22 ENCOUNTER — Inpatient Hospital Stay (HOSPITAL_BASED_OUTPATIENT_CLINIC_OR_DEPARTMENT_OTHER): Payer: Medicare HMO | Admitting: Hematology

## 2022-01-22 VITALS — BP 151/63 | HR 63 | Temp 97.9°F | Resp 18 | Ht 67.0 in | Wt 112.4 lb

## 2022-01-22 DIAGNOSIS — C250 Malignant neoplasm of head of pancreas: Secondary | ICD-10-CM | POA: Diagnosis not present

## 2022-01-22 DIAGNOSIS — C259 Malignant neoplasm of pancreas, unspecified: Secondary | ICD-10-CM

## 2022-01-22 DIAGNOSIS — Z95828 Presence of other vascular implants and grafts: Secondary | ICD-10-CM

## 2022-01-22 NOTE — Progress Notes (Signed)
START ON PATHWAY REGIMEN - Pancreatic Adenocarcinoma     A cycle is every 28 days:     Nab-paclitaxel (protein bound)      Gemcitabine   **Always confirm dose/schedule in your pharmacy ordering system**  Patient Characteristics: Preoperative, M0 (Clinical Staging), Borderline Resectable, PS = 0,1, BRCA1/2 and PALB2 Mutation Absent/Unknown Therapeutic Status: Preoperative, M0 (Clinical Staging) AJCC T Category: cT2 AJCC N Category: cN0 Resectability Status: Borderline Resectable AJCC M Category: cM0 AJCC 8 Stage Grouping: IB ECOG Performance Status: 1 BRCA1/2 Mutation Status: Awaiting Test Results PALB2 Mutation Status: Awaiting Test Results Intent of Therapy: Curative Intent, Discussed with Patient

## 2022-01-22 NOTE — Progress Notes (Signed)
[facility] [place], [place] [address]   CLINIC:  Medical Oncology/Hematology  PCP:  Abigail Miyamoto, [place] / [place] [address] [phone]   REASON FOR VISIT:  Follow-up for pancreatic adenocarcinoma  PRIOR THERAPY: none  NGS Results: not done  CURRENT THERAPY: under work-up  BRIEF ONCOLOGIC HISTORY:  Oncology History  Pancreatic carcinoma ([place])  11/11/2021 Initial Diagnosis   Pancreatic carcinoma ([place])   02/02/2022 -  Chemotherapy   Patient is on Treatment Plan : PANCREATIC Abraxane D1,8,15 + Gemcitabine D1,8,15 q28d       CANCER STAGING:  Cancer Staging  Pancreatic carcinoma ([place]) Staging form: Exocrine Pancreas, AJCC 8th Edition - Clinical stage from 11/11/2021: Stage IB (cT2, cN0, cM0) - Unsigned   INTERVAL HISTORY:  Ms. Suhey Radford, a 87 y.o. female, seen for follow-up of pancreatic cancer.  She reports that she is doing very well in terms of energy.  She is also taking care of her husband at home who has dementia.  She is able to do all her household activities.  No abdominal pain reported.  Eating is also improving.  REVIEW OF SYSTEMS:  Review of Systems  Constitutional:  Negative for appetite change, fatigue and unexpected weight change.  Gastrointestinal:  Negative for abdominal pain.  Neurological:  Negative for numbness.  All other systems reviewed and are negative.   PAST MEDICAL/SURGICAL HISTORY:  Past Medical History:  Diagnosis Date   Essential tremor    GERD (gastroesophageal reflux disease)    High cholesterol    Hypertension    PONV (postoperative nausea and vomiting)    Vertigo    Past Surgical History:  Procedure Laterality Date   BILIARY BRUSHING  09/10/2021   Procedure: BILIARY BRUSHING;  Surgeon: Irene Shipper, MD;  Location: [facility] ENDOSCOPY;  Service: Gastroenterology;;   BILIARY BRUSHING  10/29/2021   Procedure: BILIARY BRUSHING;  Surgeon: Irving Copas., MD;   Location: [facility] ENDOSCOPY;  Service: Gastroenterology;;   BILIARY DILATION  10/29/2021   Procedure: BILIARY DILATION;  Surgeon: Irving Copas., MD;  Location: [facility] ENDOSCOPY;  Service: Gastroenterology;;   BILIARY STENT PLACEMENT  09/10/2021   Procedure: BILIARY STENT PLACEMENT;  Surgeon: Irene Shipper, MD;  Location: [facility] ENDOSCOPY;  Service: Gastroenterology;;   BILIARY STENT PLACEMENT N/A 10/03/2021   Procedure: BILIARY STENT PLACEMENT;  Surgeon: Gatha Mayer, MD;  Location: [facility] ENDOSCOPY;  Service: Gastroenterology;  Laterality: N/A;   BILIARY STENT PLACEMENT N/A 10/29/2021   Procedure: BILIARY STENT PLACEMENT;  Surgeon: Rush Landmark Telford Nab., MD;  Location: [facility] ENDOSCOPY;  Service: Gastroenterology;  Laterality: N/A;   BIOPSY  10/29/2021   Procedure: BIOPSY;  Surgeon: Rush Landmark Telford Nab., MD;  Location: [facility] ENDOSCOPY;  Service: Gastroenterology;;   ENDOSCOPIC RETROGRADE CHOLANGIOPANCREATOGRAPHY (ERCP) WITH PROPOFOL N/A 10/29/2021   Procedure: ENDOSCOPIC RETROGRADE CHOLANGIOPANCREATOGRAPHY (ERCP) WITH PROPOFOL;  Surgeon: Irving Copas., MD;  Location: [facility] ENDOSCOPY;  Service: Gastroenterology;  Laterality: N/A;   ERCP N/A 09/10/2021   Procedure: ENDOSCOPIC RETROGRADE CHOLANGIOPANCREATOGRAPHY (ERCP);  Surgeon: Irene Shipper, MD;  Location: [place];  Service: Gastroenterology;  Laterality: N/A;   ERCP N/A 10/03/2021   Procedure: ENDOSCOPIC RETROGRADE CHOLANGIOPANCREATOGRAPHY (ERCP);  Surgeon: Gatha Mayer, MD;  Location: [facility] ENDOSCOPY;  Service: Gastroenterology;  Laterality: N/A;   ESOPHAGOGASTRODUODENOSCOPY (EGD) WITH PROPOFOL N/A 10/29/2021   Procedure: ESOPHAGOGASTRODUODENOSCOPY (EGD) WITH PROPOFOL;  Surgeon: Rush Landmark Telford Nab., MD;  Location: [facility] ENDOSCOPY;  Service: Gastroenterology;  Laterality: N/A;   EUS N/A 10/29/2021   Procedure:  UPPER ENDOSCOPIC ULTRASOUND (EUS) RADIAL;  Surgeon: Rush Landmark Telford Nab., MD;  Location: [facility] ENDOSCOPY;  Service: Gastroenterology;   Laterality: N/A;   FINE NEEDLE ASPIRATION  10/29/2021   Procedure: FINE NEEDLE ASPIRATION (FNA) LINEAR;  Surgeon: Irving Copas., MD;  Location: [facility] ENDOSCOPY;  Service: Gastroenterology;;   PACEMAKER IMPLANT     REMOVAL OF STONES  10/29/2021   Procedure: REMOVAL OF SLUDGE;  Surgeon: Irving Copas., MD;  Location: [facility] ENDOSCOPY;  Service: Gastroenterology;;   Joan Mayans  09/10/2021   Procedure: Joan Mayans;  Surgeon: Irene Shipper, MD;  Location: [facility] ENDOSCOPY;  Service: Gastroenterology;;   Bess Kinds CHOLANGIOSCOPY N/A 10/29/2021   Procedure: VVOHYWVP CHOLANGIOSCOPY;  Surgeon: Irving Copas., MD;  Location: [facility] ENDOSCOPY;  Service: Gastroenterology;  Laterality: N/A;   STENT REMOVAL  10/03/2021   Procedure: STENT REMOVAL;  Surgeon: Gatha Mayer, MD;  Location: [facility] ENDOSCOPY;  Service: Gastroenterology;;    SOCIAL HISTORY:  Social History   Socioeconomic History   Marital status: Married    Spouse name: Not on file   Number of children: Not on file   Years of education: Not on file   Highest education level: Not on file  Occupational History   Not on file  Tobacco Use   Smoking status: Never   Smokeless tobacco: Never  Vaping Use   Vaping Use: Never used  Substance and Sexual Activity   Alcohol use: Never   Drug use: Never   Sexual activity: Not on file  Other Topics Concern   Not on file  Social History Narrative   Not on file   Social Determinants of Health   Financial Resource Strain: Not on file  Food Insecurity: Not on file  Transportation Needs: Not on file  Physical Activity: Not on file  Stress: Not on file  Social Connections: Not on file  Intimate Partner Violence: Not on file    FAMILY HISTORY:  Family History  Problem Relation Age of Onset   Stomach cancer Neg Hx    Esophageal cancer Neg Hx    Colon cancer Neg Hx    Pancreatic cancer Neg Hx     CURRENT MEDICATIONS:  Current Outpatient Medications  Medication Sig  Dispense Refill   amLODipine (NORVASC) 2.5 MG tablet Take 2.5 mg by mouth at bedtime.     aspirin EC 81 MG tablet Take 81 mg by mouth daily. Swallow whole.     Cholecalciferol (VITAMIN D3) 50 MCG (2000 UT) TABS Take 2,000 Units by mouth daily after supper.     Coenzyme Q10 (CO Q-10) 100 MG CAPS Take 100 mg by mouth every evening.     cycloSPORINE (RESTASIS) 0.05 % ophthalmic emulsion INSTILL ONE DROP IN EACH EYE TWICE DAILY     famotidine (PEPCID) 40 MG tablet Take 40 mg by mouth every evening.     gabapentin (NEURONTIN) 100 MG capsule Take 100 mg by mouth 2 (two) times daily.     LORazepam (ATIVAN) 1 MG tablet Take 0.5 tablets (0.5 mg total) by mouth as needed for anxiety. Take one hour prior to MRI and again 30 min before if needed. 2 tablet 0   meclizine (ANTIVERT) 25 MG tablet Take 25 mg by mouth daily as needed for dizziness.     Misc Natural Products (JOINT SUPPORT) CAPS Take 1 capsule by mouth daily with breakfast.     Multiple Vitamin (MULTIVITAMIN) tablet Take 1 tablet by mouth daily with breakfast.     Multiple Vitamins-Minerals ([place])  CAPS Take 1 capsule by mouth daily.     nebivolol (BYSTOLIC) 5 MG tablet Take 2.5 mg by mouth in the morning.     omeprazole (PRILOSEC) 40 MG capsule Take 40 mg by mouth daily before breakfast.     ondansetron (ZOFRAN-ODT) 4 MG disintegrating tablet Take 4 mg by mouth every 8 (eight) hours as needed for nausea or vomiting.     polyethylene glycol (MIRALAX) 17 g packet Take 17 g by mouth daily as needed. (Patient taking differently: Take 17 g by mouth daily as needed for moderate constipation.) 14 each 0   pravastatin (PRAVACHOL) 40 MG tablet pravastatin 40 mg tablet  TAKE 1 TABLET BY MOUTH EVERY DAY     predniSONE (DELTASONE) 20 MG tablet Take one 20 mg tablet 13 hours, then 7 hours, and then again 1 hour prior to scan 3 tablet 0   primidone (MYSOLINE) 50 MG tablet Take 25 mg by mouth daily as needed (for tremors).     pyridOXINE  (VITAMIN B-6) 100 MG tablet Take 50 mg by mouth in the morning.     tiZANidine (ZANAFLEX) 2 MG tablet Take 1 mg by mouth in the morning.     diphenhydrAMINE (BENADRYL ALLERGY) 25 MG tablet Take 1 tablet (25 mg total) by mouth once for 1 dose. Take 1 hour prior to scan along with Prednisone 1 tablet 0   No current facility-administered medications for this visit.    ALLERGIES:  Allergies  Allergen Reactions   Ivp Dye [Iodinated Contrast Media] Hives   Lisinopril Cough    PHYSICAL EXAM:  Performance status (ECOG): 1 - Symptomatic but completely ambulatory  Vitals:   01/22/22 1321  BP: (!) 151/63  Pulse: 63  Resp: 18  Temp: 97.9 F (36.6 C)  SpO2: 100%   Wt Readings from Last 3 Encounters:  01/22/22 112 lb 6.4 oz (51 kg)  01/01/22 111 lb 1.6 oz (50.4 kg)  11/11/21 109 lb 8 oz (49.7 kg)   Physical Exam Vitals reviewed.  Constitutional:      Appearance: Normal appearance.  Cardiovascular:     Rate and Rhythm: Normal rate and regular rhythm.     Pulses: Normal pulses.     Heart sounds: Normal heart sounds.  Pulmonary:     Effort: Pulmonary effort is normal.     Breath sounds: Normal breath sounds.  Neurological:     General: No focal deficit present.     Mental Status: She is alert and oriented to person, place, and time.  Psychiatric:        Mood and Affect: Mood normal.        Behavior: Behavior normal.      LABORATORY DATA:  I have reviewed the labs as listed.     Latest Ref Rng & Units 01/08/2022   11:26 AM 11/10/2021    2:24 PM 10/04/2021    6:12 AM  CBC  WBC 4.0 - 10.5 K/uL 7.7  7.8  13.4   Hemoglobin 12.0 - 15.0 g/dL 11.2  10.8  9.3   Hematocrit 36.0 - 46.0 % 34.7  33.4  27.2   Platelets 150 - 400 K/uL 256  394  310       Latest Ref Rng & Units 01/08/2022   11:26 AM 11/10/2021    2:24 PM 10/23/2021    3:34 PM  CMP  Glucose 70 - 99 mg/dL 159  113  129   BUN 8 - 23 mg/dL 33  27  21  Creatinine 0.44 - 1.00 mg/dL 1.25  1.59  1.30   Sodium 135 - 145  mmol/L 136  136  134   Potassium 3.5 - 5.1 mmol/L 4.1  4.7  4.0   Chloride 98 - 111 mmol/L 105  104  103   CO2 22 - 32 mmol/L '24  25  26   '$ Calcium 8.9 - 10.3 mg/dL 10.1  10.3  9.1   Total Protein 6.5 - 8.1 g/dL 7.1  6.9  6.3   Total Bilirubin 0.3 - 1.2 mg/dL 0.7  1.4  2.4   Alkaline Phos 38 - 126 U/L 134  223  530   AST 15 - 41 U/L 32  29  37   ALT 0 - 44 U/L 36  29  74     DIAGNOSTIC IMAGING:  I have independently reviewed the scans and discussed with the patient. CT ABDOMEN W WO CONTRAST  Result Date: 01/19/2022 CLINICAL DATA:  Pancreatic head cancer, staging. * Tracking Code: BO *. EXAM: CT ABDOMEN WITHOUT AND WITH CONTRAST TECHNIQUE: Multidetector CT imaging of the abdomen was performed following the standard protocol before and following the bolus administration of intravenous contrast. RADIATION DOSE REDUCTION: This exam was performed according to the departmental dose-optimization program which includes automated exposure control, adjustment of the mA and/or kV according to patient size and/or use of iterative reconstruction technique. CONTRAST:  69m OMNIPAQUE IOHEXOL 300 MG/ML  SOLN COMPARISON:  Multiple priors including CT chest abdomen and pelvis dated November 10, 2021, nuclear medicine PET scan November 20, 2021 and MRI abdomen December 24, 2021. FINDINGS: Lower chest: Bibasilar atelectasis/scarring. Hepatobiliary: No suspicious hepatic lesion. Gallbladder is nondistended. Similar pericholecystic fluid around the gallbladder fundus with a new enhancing focus of nodular soft tissue along the posterior aspect of the gallbladder wall measuring 9 mm on image 80/11. Metallic stent in the common bile duct with pneumobilia consistent with stent patency. No biliary ductal dilation. Pancreas: Hypoenhancing mass in the pancreatic head measures 2.9 x 1.8 cm on image 41/4. Mild prominence of the pancreatic duct without significant distal gland atrophy. The mass abuts the SMA without evidence of encasement and  best seen on axial image 38/4. Additionally there is slightly greater than 180 degrees of abutment of the portal vein by the mass with some focal effacement of the vein but without definite CT evidence of encasement and best seen on sagittal image 50/8. Spleen: No splenomegaly or focal splenic lesion. Adrenals/Urinary Tract: Bilateral adrenal glands appear normal. Bilateral renal cysts are considered benign and require no independent imaging follow-up. Bilateral cortical renal scarring. Stomach/Bowel: No radiopaque enteric contrast material was administered. Stomach is minimally distended limiting evaluation. No pathologic dilation or evidence of acute inflammation involving loops of large or small bowel in the abdomen. Vascular/Lymphatic: Aortic atherosclerosis. Prominent retroperitoneal lymph nodes measuring up to 6 mm on image 62/11 are similar prior. Other: Mild mesenteric and omental stranding. Musculoskeletal: Multilevel degenerative changes spine. No aggressive lytic or blastic lesion of bone. IMPRESSION: 1. Hypoenhancing mass in the pancreatic head measuring 2.9 x 1.8 cm is consistent with the patient's known primary pancreatic neoplasm. 2. The pancreatic neoplasm demonstrates slightly greater than 180 degrees of abutment of the portal vein as well as abutment of the SMA but without definite vascular encasement. 3. Similar trace pericholecystic fluid along the gallbladder fundus with a new enhancing 9 mm soft tissue nodule along the posterior aspect of the gallbladder wall, suspicious for a peritoneal implant. 4. Prominent retroperitoneal lymph nodes measuring up  to 6 mm are similar prior and while not hypermetabolic on prior PET-CT are below the resolution of that examination, attention on follow-up imaging suggested. 5. Metallic stent in the common bile duct with pneumobilia consistent with stent patency. 6.  Aortic Atherosclerosis (ICD10-I70.0). Electronically Signed   By: Dahlia Bailiff M.D.   On:  01/19/2022 16:54   MR Abdomen W Wo Contrast  Result Date: 12/25/2021 CLINICAL DATA:  Follow-up pancreatic cancer. EXAM: MRI ABDOMEN WITHOUT AND WITH CONTRAST TECHNIQUE: Multiplanar multisequence MR imaging of the abdomen was performed both before and after the administration of intravenous contrast. CONTRAST:  76m GADAVIST GADOBUTROL 1 MMOL/ML IV SOLN COMPARISON:  Multiple previous imaging examinations. The most recent MRI examination is 09/08/2021. Recent PET-CT 11/20/2021 FINDINGS: Limited examination due to breathing motion artifact. The patient had a hard time holding her breath. Lower chest: The lung bases are grossly clear and stable. Hepatobiliary: Stable small scattered hepatic cysts. No worrisome hepatic lesions to suggest metastatic disease. No intrahepatic biliary dilatation. There is a common bile duct stent in place. Pancreas: The pancreatic head mass measures approximately 2.7 x 2.3 cm and previously measured 2.5 x 1.8 cm. The mass is abutting and slightly displacing the SMA. No obvious fat plane is demonstrated. This may be better evaluated with CT using pancreatic protocol. The portal and splenic veins are patent. Stable dilatation of the main pancreatic duct. Spleen:  Normal size.  No focal lesions. Adrenals/Urinary Tract: Adrenal glands and kidneys are unremarkable. Bilateral renal cysts are stable. Stomach/Bowel: The stomach, duodenum, visualized small bowel and visualized colon are grossly normal. Vascular/Lymphatic: The aorta is normal in caliber. Moderate atherosclerotic calcifications. The branch vessels are patent. The major venous structures are patent. No obvious lymphadenopathy. Other:  No ascites or abdominal wall hernia. Musculoskeletal: No significant bony findings. IMPRESSION: 1. Limited examination due to breathing motion artifact. 2. 2.7 x 2.3 cm pancreatic head mass, previously measured 2.5 x 1.8 cm. The mass is abutting the SMA without obvious fat plane but this would be better  evaluated with CT using pancreatic protocol (if the patient is a surgical candidate). 3. Stable small scattered hepatic cysts. No worrisome hepatic lesions to suggest metastatic disease. 4. Stable bilateral renal cysts. 5. Common bile duct stent in place. No intrahepatic biliary dilatation. Stable dilatation of the main pancreatic duct. Electronically Signed   By: PMarijo SanesM.D.   On: 12/25/2021 09:05     ASSESSMENT:  T2 N0 M1 pancreatic adenocarcinoma: - She presented with painless jaundice in April 2023.  Weight loss of 13 pounds since April. - Stent placement on 09/10/2021 for a bilirubin of 8.2. - 10/03/2021: ERCP and stent removal and stent placement for stent dysfunction with total bilirubin 16.3. - 10/29/2021: ERCP/EUS: Lower CBD stent stenosis from stricture, status post metal stent placement.  EUS showed mass in the superior pancreatic head measuring 2.8 x 2.2 cm, suggesting abutment of the portal vein.  Intact interface was seen between lesion and SMA and celiac trunk suggesting lack of invasion.  Hypoechoic 14 x 12 mm lesion visualized in the liver.  Biopsy could not be attempted due to presence of blood vessel. - She had syncopal episodes in January, had pacemaker placement for bradycardia. - Pathology: Pancreatic head FNA and biliary stricture brushing: Malignant cells consistent with adenocarcinoma. - CA 19-9: 131 - We reviewed MRI of the abdomen with and without contrast (12/24/2021): Pancreatic head mass measures 2.7 x 2.3 cm (2.5 x 1.8 cm previously).  Mass is abutting and slightly  displacing the SMA.  No obvious fat plane is demonstrated.  Portal and splenic veins are patent.  Stable small scattered hepatic cysts with no liver metastatic disease. - PET scan (11/20/2021): Ill-defined low-level hypermetabolic activity about the metallic CBD stent and pancreatic uncinate process without discrete CT correlate.  No convincing evidence of hypermetabolic disease in the neck, chest, abdomen or  pelvis.  Tiny bilateral lung nodules measuring up to 3 mm.   Social/family history: - She lives at home with her husband who has dementia.  She worked as a Char[employer] fundraiser for 33 years.  Non-smoker and no exposure to chemicals.  She has not been driving since 17-Jun-2022. - Daughter died of glioblastoma.  Mother had ovarian cancer.  Maternal aunt had breast cancer.   PLAN:  T2 N0 M0 pancreatic adenocarcinoma: -CT pancreatic protocol (01/16/2022): Hypoenhancing mass in the pancreatic head measuring 2.9 x 1.8 cm.  Slightly greater than 180 degrees of abutment with portal vein as well as SMA without definite vascular encasement.  New enhancing 9 mm soft tissue nodule along the posterior aspect of the gallbladder wall?  Suspicious for peritoneal implant. -I have discussed with Dr. Zenia Resides who has seen this patient. -Dr. Zenia Resides thought that this patient might not be a candidate for surgery but she would like to evaluate after 3 to 4 months of neoadjuvant chemotherapy. -We talked about neoadjuvant chemotherapy.  She is not a candidate for FOLFIRINOX.  I have recommended gemcitabine and Abraxane every other week -Recommend port placement. -We discussed side effects in detail.  We have given literature to the patient.  We will dose reduce during first cycle to see how she tolerates.  2.  Weight loss: -She lost about 13 pounds since April of this year.  However she is gaining back her weight.    Orders placed this encounter:  Orders Placed This Encounter  Procedures   IR IMAGING GUIDED PORT INSERTION   CBC with Differential   Comprehensive metabolic panel   CBC with Differential   Comprehensive metabolic panel   CBC with Differential   Comprehensive metabolic panel   CBC with Differential   Comprehensive metabolic panel   CBC with Differential   Comprehensive metabolic panel   CBC with Differential   Comprehensive metabolic panel   CBC with Differential   Comprehensive metabolic panel   CBC with  Differential   Comprehensive metabolic panel      Derek Jack, MD [place] [phone]

## 2022-01-22 NOTE — Patient Instructions (Addendum)
[place] at [facility] Discharge Instructions   You were seen and examined today by Dr. Delton Coombes.  He discussed with you that the tumor in your pancreas is close to the blood vessels there, and surgery is not an option right now. Dr. Raliegh Ip recommends that you start on chemotherapy to hopefully shrink the tumor making surgery a possible option in the future.  He discussed the chemotherapy treatment option with you involving coming to the clinic every other week to receive two different chemotherapy drugs called Abraxane and Gemzar. Chemotherapy administration will require you to have a port a cath placed. We will arrange for this to be placed in interventional radiology in [place].   Return as scheduled.    Thank you for choosing [place] at [facility] to provide your oncology and hematology care.  To afford each patient quality time with our provider, please arrive at least 15 minutes before your scheduled appointment time.   If you have a lab appointment with the [facility] please come in thru the Main Entrance and check in at the main information desk.  You need to re-schedule your appointment should you arrive 10 or more minutes late.  We strive to give you quality time with our providers, and arriving late affects you and other patients whose appointments are after yours.  Also, if you no show three or more times for appointments you may be dismissed from the clinic at the providers discretion.     Again, thank you for choosing [facility].  Our hope is that these requests will decrease the amount of time that you wait before being seen by our physicians.       _____________________________________________________________  Should you have questions after your visit to [facility], please contact our office at [phone] and follow the prompts.  Our office hours are 8:00 a.m. and 4:30 p.m. Monday - Friday.   Please note that voicemails left after 4:00 p.m. may not be returned until the following business day.  We are closed weekends and major holidays.  You do have access to a nurse 24-7, just call the main number to the clinic [phone] and do not press any options, hold on the line and a nurse will answer the phone.    For prescription refill requests, have your pharmacy contact our office and allow 72 hours.    Due to Covid, you will need to wear a mask upon entering the hospital. If you do not have a mask, a mask will be given to you at the Main Entrance upon arrival. For doctor visits, patients may have 1 support person age 31 or older with them. For treatment visits, patients can not have anyone with them due to social distancing guidelines and our immunocompromised population.

## 2022-01-23 ENCOUNTER — Other Ambulatory Visit: Payer: Self-pay

## 2022-01-23 NOTE — Progress Notes (Signed)
Pharmacist Chemotherapy Monitoring - Initial Assessment    Anticipated start date: 02/05/22   The following has been reviewed per standard work regarding the patient's treatment regimen: The patient's diagnosis, treatment plan and drug doses, and organ/hematologic function Lab orders and baseline tests specific to treatment regimen  The treatment plan start date, drug sequencing, and pre-medications Prior authorization status  Patient's documented medication list, including drug-drug interaction screen and prescriptions for anti-emetics and supportive care specific to the treatment regimen The drug concentrations, fluid compatibility, administration routes, and timing of the medications to be used The patient's access for treatment and lifetime cumulative dose history, if applicable  The patient's medication allergies and previous infusion related reactions, if applicable   Changes made to treatment plan:  treatment plan date  Follow up needed:  Pending authorization for treatment    Wynona Neat, [facility] 01/23/2022  12:51 PM

## 2022-01-24 ENCOUNTER — Other Ambulatory Visit: Payer: Self-pay

## 2022-01-26 ENCOUNTER — Other Ambulatory Visit: Payer: Self-pay

## 2022-01-27 ENCOUNTER — Other Ambulatory Visit (HOSPITAL_COMMUNITY): Payer: Self-pay | Admitting: Physician Assistant

## 2022-01-27 ENCOUNTER — Other Ambulatory Visit: Payer: Self-pay | Admitting: Radiology

## 2022-01-27 ENCOUNTER — Encounter: Payer: Self-pay | Admitting: Hematology

## 2022-01-27 DIAGNOSIS — Z95828 Presence of other vascular implants and grafts: Secondary | ICD-10-CM | POA: Insufficient documentation

## 2022-01-27 MED ORDER — LIDOCAINE-PRILOCAINE 2.5-2.5 % EX CREA: TOPICAL_CREAM | CUTANEOUS | 3 refills | Status: DC

## 2022-01-27 MED ORDER — PROCHLORPERAZINE MALEATE 10 MG PO TABS: 10.0000 mg | ORAL_TABLET | Freq: Four times a day (QID) | ORAL | 6 refills | Status: DC | PRN

## 2022-01-27 NOTE — Patient Instructions (Addendum)
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 We will treat you in the clinic every other week with a combination of chemotherapy drugs. Those drugs are Abraxane and Gemzar. The intent of treatment is cure.You will see the doctor regularly throughout treatment.  We will obtain blood work from you prior to every treatment and monitor your results to make sure it is safe to give your treatment. The doctor monitors your response to treatment by the way you are feeling, your blood work, and by obtaining scans periodically.  There will be wait times while you are here for treatment. It will take about 30 minutes to 1 hour for your lab work to result. Then there will be wait times while pharmacy mixes your medications.    Medications you will receive in the clinic prior to your chemotherapy medications:  Aloxi:  ALOXI is used in adults to help prevent nausea and vomiting that happens with certain chemotherapy drugs.  Aloxi is a long acting medication, and will remain in your system for about two days.   Dexamethasone:  This is a steroid given prior to chemotherapy to help prevent allergic reactions; it may also help prevent and control nausea and diarrhea.      Nab-paclitaxel (protein-bound) (Abraxane)  About This Drug Nab-paclitaxel is used to treat cancer. It is given in the vein (IV).  It takes 30 minutes to infuse.   Possible Side Effects  Bone marrow suppression. This is a decrease in the number of white blood cells, red blood cells, and platelets. This may raise your risk of infection, make you tired and weak (fatigue), and raise your risk of bleeding.   Abnormal heart beat, blood pressure, and/or abnormal EKG (electrocardiogram)   Nausea and vomiting (throwing up)   Diarrhea (loose bowel movements)   Tiredness, weakness   Swelling of your legs, ankles and/or feet   Fever   Changes in your liver function   Infection    Dehydration (lack of water in the body from losing too much fluid)   Decreased appetite (decreased hunger)   Joint, bone and muscle pain   Rash   Effects on the nerves are called peripheral neuropathy. You may feel numbness, tingling, or pain in your hands and feet. It may be hard for you to button your clothes, open jars, or walk as usual. The effect on the nerves may get worse with more doses of the drug. These effects get better in some people after the drug is stopped but it does not get better in all people.   Hair loss. Hair loss is often temporary, although with certain medicine, hair loss can sometimes be permanent. Hair loss may happen suddenly or gradually. If you lose hair, you may lose it from your head, face, armpits, pubic area, chest, and/or legs. You may also notice your hair getting thin.  Note: Each of the side effects above was reported in 20% or greater of patients treated with nabpaclitaxel. Not all possible side effects are included above.  Warnings and Precautions  Severe bone marrow suppression   Inflammation (swelling) of the lungs. You may have a dry cough or trouble breathing.  Severe infections which can be life-threatening   Severe peripheral neuropathy   Allergic reactions, including anaphylaxis are rare but may happen in some patients, which can be life-threatening. Signs of allergic reaction to this drug may be swelling of the face, feeling like your tongue or throat are swelling,  trouble breathing, rash, itching, fever, chills, feeling dizzy, and/or feeling that your heart is beating in a fast or not normal way. If this happens, do not take another dose of this drug. You should get urgent medical treatment.   This drug contains albumin, which is a protein from donated human blood. There is a rare risk of transmission of viral diseases.  Note: Some of the side effects above are very rare. If you have concerns and/or questions, please discuss them with your  medical team.  Important Information  This drug may be present in the saliva, tears, sweat, urine, stool, vomit, semen, and vaginal secretions. Talk to your doctor and/or your nurse about the necessary precautions to take during this time.  Treating Side Effects  To decrease the risk of infection, wash your hands regularly.   Avoid close contact with people who have a cold, the flu, or other infections.   Take your temperature as your doctor or nurse tells you, and whenever you feel like you may have a fever.   To help decrease the risk of bleeding, use a soft toothbrush. Check with your nurse before using dental floss.   Be very careful when using knives or tools.   Use an electric shaver instead of a razor.   Manage tiredness by pacing your activities for the day. Be sure to include periods of rest between energy-draining activities.   Drink plenty of fluids (a minimum of eight glasses per day is recommended).   To help with decreased appetite, eat small, frequent meals. Eat foods high in calories and protein, such as meat, poultry, fish, dry beans, tofu, eggs, nuts, milk, yogurt, cheese, ice cream, pudding, and nutritional supplements.   Consider using sauces and spices to increase taste. Daily exercise, with your doctor's approval, may increase your appetite.   If you throw up or have loose bowel movements, you should drink more fluids so that you do not become dehydrated (lack of water in the body from losing too much fluid).   To help with nausea and vomiting, eat small, frequent meals instead of three large meals a day. Choose foods and drinks that are at room temperature. Ask your nurse or doctor about other helpful tips and medicine that is available to help stop or lessen these symptoms.   If you have diarrhea, eat low-fiber foods that are high in protein and calories and avoid foods that can irritate your digestive tracts or lead to cramping.   Ask your nurse or  doctor about medicine that can lessen or stop your diarrhea.   To help with hair loss, wash hair with a mild shampoo and avoid washing your hair every day.   Avoid rubbing your scalp, pat your hair or scalp dry.   Avoid coloring your hair.   Limit your use of hair spray, electric curlers, blow dryers, and curling irons.   If you are interested in getting a wig, talk to your nurse. You can also call the [facility] at [phone] to find out information about the "Look Good, Feel Better" program closeto where you live. It is a free program where women getting chemotherapy can learn about wigs, turbans and scarves as well as makeup techniques and skin and nail care.   Keeping your pain under control is important to your well-being. Please tell your doctor or nurse if you are experiencing pain.   If you get a rash do not put anything on it unless your doctor or nurse  says you may. Keep the area around the rash clean and dry. Ask your doctor for medicine if your rash bothers you.   If you have numbness and tingling in your hands and feet, be careful when cooking, walking, and handling sharp objects and hot liquids.  Food and Drug Interactions  There are no known interactions of nab-paclitaxel of with food.   This drug may interact with other medicines. Tell your doctor and pharmacist about all the prescription and over-the-counter medicines and dietary supplements (vitamins, minerals, herbs and others) that you are taking at this time. Also, check with your doctor or pharmacist before starting any new prescription or over-the-counter medicines, or dietary supplements to make sure that there are no interactions.  When to Call the Doctor Call your doctor or nurse if you have any of these symptoms and/or any new or unusual symptoms:   Fever of 100.4 F (38 C) or higher   Chills   Signs of a local infection such as pain, redness, tenderness, warmth and/or swelling   Easy  bleeding or bruising   Wheezing or trouble breathing   Tiredness that interferes with your daily activities   Pain in your chest   Dry cough   Feeling dizzy or lightheaded   Feeling that your heart is beating in a fast or not normal way (palpitations)   Diarrhea, 4 times in one day or diarrhea with weakness or lightheadedness   Nausea that stops you from eating or drinking and/or that isn't relieved by prescribed medicines   Throwing up   Lasting loss of appetite or rapid weight loss of five pounds in a week   Pain that does not go away or is not relieved by prescribed medicine   Numbness, tingling, or pain in your hands and feet   Weight gain of 5 pounds in one week (fluid retention)   Swelling of your legs, ankles and/or feet   Signs of liver problems: dark urine, pale bowel movements, bad stomach pain, feeling very tired and weak, unusual itching, or yellowing of the eyes or skin   Signs of allergic reaction: swelling of the face, feeling like your tongue or throat are swelling, trouble breathing, rash, itching, fever, chills, feeling dizzy, and/or feeling that your heart is beating in a fast or not normal way. If this happens, call 911 for emergency care.   New rash and/or itching   Rash that is not relieved by prescribed medicines   If you think you are pregnant or have impregnated your partner  Reproduction Warnings  Pregnancy warning: This drug can have harmful effects on the unborn baby. Women of childbearing potential should use effective methods of birth control during your cancer treatment and for at least 6 months after treatment. Men with female partners of childbearing potential should use effective methods of birth control during your cancer treatment and for at least 3 months after your cancer treatment. Let your doctor know right away if you think you may be pregnant or may have impregnated your partner.   Breastfeeding warning: It is not known if this drug  passes into breast milk. For this reason, women should not breastfeed during treatment and for 2 weeks after treatment because this drug could enter the breast milk and cause harm to a breastfeeding baby.   Fertility Warning: In men and women both, this drug may affect your ability to have children in the future. Talk with your doctor or nurse if you plan to have children. Ask  for information on sperm or egg banking.  Gemcitabine (Gemzar)  About This Drug Gemcitabine is used to treat cancer. It is given in the vein (IV).  It will take 30 minutes to infuse.   Possible Side Effects  Bone marrow suppression. This is a decrease in the number of white blood cells, red blood cells, and platelets. This may raise your risk of infection, make you tired and weak (fatigue), and raise your risk of bleeding   Fever   Trouble breathing   Nausea and throwing up (vomiting)   Changes in your liver function   Increased protein in your urine, which can affect how your kidneys work   Blood in your urine   Rash   Swelling of your legs, ankles and/or feet  Note: Each of the side effects above was reported in 20% or greater of patients treated with Gemcitabine. Not all possible side effects are included above.  Warnings and Precautions  Severe bone marrow suppression   Inflammation (swelling) of the lungs and/or thickening of the lung tissues, which may be lifethreatening. You may have a dry cough or trouble breathing.   Changes in your kidney function, which can cause kidney failure   Changes in your liver function, which can cause liver failure and may be life-threatening   If you have received radiation treatments, your skin may become red and/or you may develop soreness of the mouth and throat after gemcitabine. This reaction is called "recall." Your body is recalling, or remembering, that it had radiation therapy.   A syndrome where fluid from your veins can leak into your tissues and cause a  decrease in your blood pressure and fluid to accumulate in your tissues and/or lungs.   A syndrome can occur that causes changes to kidney and liver function in combination with a decrease in red blood cells. Kidney failure may result which may be life-threatening.   Changes in your central nervous system can happen. The central nervous system is made up of your brain and spinal cord. You could feel extreme tiredness, agitation, confusion, hallucinations (see or hear things that are not there), have trouble understanding or speaking, loss of control of your bowels or bladder, eyesight changes, numbness or lack of strength to your arms, legs, face, or body, seizures or coma. If you start to have any of these symptoms let your doctor know right away.  Note: Some of the side effects above are very rare. If you have concerns and/or questions, please discuss them with your medical team.  Important Information  This drug may be present in the saliva, tears, sweat, urine, stool, vomit, semen, and vaginal secretions. Talk to your doctor and/or your nurse about the necessary precautions to take during this time.  Treating Side Effects  Manage tiredness by pacing your activities for the day.   Be sure to include periods of rest between energy-draining activities.   To decrease the risk of infection, wash your hands regularly.   Avoid close contact with people who have a cold, the flu, or other infections.   Take your temperature as your doctor or nurse tells you, and whenever you feel like you may have a fever.   To help decrease bleeding, use a soft toothbrush. Check with your nurse before using dental floss.   Be very careful when using knives or tools.   Use an electric shaver instead of a razor.   Drink plenty of fluids (a minimum of eight glasses per day is recommended).  If you throw up or have loose bowel movements, you should drink more fluids so that you do not become dehydrated (lack  of water in the body from losing too much fluid).   To help with nausea and vomiting, eat small, frequent meals instead of three large meals a day. Choose foods and drinks that are at room temperature. Ask your nurse or doctor about other helpful tips and medicine that is available to help stop or lessen these symptoms.   If you get a rash do not put anything on it unless your doctor or nurse says you may. Keep the area around the rash clean and dry. Ask your doctor for medicine if your rash bothers you.   If you received radiation, and your skin becomes red or irritated again, or you develop soreness of the mouth and throat, follow the same care instructions you did during radiation treatment. Be sure to tell the nurse or doctor administering your chemotherapy about your skin changes.  Food and Drug Interactions  There are no known interactions of gemcitabine with food.   This drug may interact with other medicines. Tell your doctor and pharmacist about all the prescription and over-the-counter medicines and dietary supplements (vitamins, minerals, herbs and others) that you are taking at this time. Also, check with your doctor or pharmacist before starting any new prescription or over-the-counter medicines, or dietary supplements to make sure that there are no interactions.  When to Call the Doctor Call your doctor or nurse if you have any of these symptoms and/or any new or unusual symptoms:   Fever of 100.4 F (38 C) or higher   Chills   Tiredness that interferes with your daily activities   Feeling dizzy or lightheaded   Pain in your chest   Dry cough   Wheezing and/or trouble breathing   Confusion and/or agitation   Symptoms of a seizure such as confusion, blacking out, passing out, loss of hearing or vision, blurred vision, unusual smells or tastes (such as burning rubber), trouble talking, tremors or shaking in parts or all of the body, repeated body movements, tense muscles  that do not relax, and loss of control of urine and bowels. If you or your family member suspects you are having a seizure, call 911 right away.   Hallucinations   Trouble understanding or speaking   Blurry vision or changes in your eyesight   Numbness or lack of strength to your arms, legs, face, or body   Easy bleeding or bruising   Nausea that stops you from eating or drinking and/or is not relieved by prescribed medicines   Throwing up   Swelling of legs, ankles, or feet   Weight gain of 5 pounds in one week (fluid retention)   Blood in urine   Decreased urine or very dark urine   Foamy or bubbly-looking urine   A new rash/itching or a rash that is not relieved by prescribed medicines   Signs of possible liver problems: dark urine, pale bowel movements, bad stomach pain, feeling very tired and weak, unusual itching, or yellowing of the eyes or skin    If you think you may be pregnant or may have impregnated your partner  Reproduction Warnings   Pregnancy warning: This drug can have harmful effects on the unborn baby. Women of childbearing potential should use effective methods of birth control during your cancer treatment and for 6 months after treatment. Men with female partners of childbearing potential should use effective  methods of birth control during your cancer treatment and for 3 months after your cancer treatment. Let your doctor know right away if you think you may be pregnant or may have impregnated your partner.   Breastfeeding warning: Women should not breastfeed during treatment and for 1 week after treatment because this drug could enter the breast milk and cause harm to a breastfeeding baby.   Fertility warning: In men, this drug may affect your ability to have children in the future. Talk with your doctor or nurse if you plan to have children. Ask for information on sperm banking.  SELF CARE ACTIVITIES WHILE RECEIVING CHEMOTHERAPY:  Hydration Increase  your fluid intake and drink at least 8 to 12 cups (64 ounces) of water/decaffeinated beverages per day after treatment. You can still have your cup of coffee or soda but these beverages do not count as part of your 8 to 12 cups that you need to drink daily. No alcohol intake.  Medications Continue taking your normal prescription medication as prescribed.  If you start any new herbal or new supplements please let us know first to make sure it is safe.  Mouth Care Have teeth cleaned professionally before starting treatment. Keep dentures and partial plates clean. Use soft toothbrush and do not use mouthwashes that contain alcohol. Biotene is a good mouthwash that is available at most pharmacies or may be ordered by calling [phone]. Use warm salt water gargles (1 teaspoon salt per 1 quart warm water) before and after meals and at bedtime. If you need dental work, please let the doctor know before you go for your appointment so that we can coordinate the best possible time for you in regards to your chemo regimen. You need to also let your dentist know that you are actively taking chemo. We may need to do labs prior to your dental appointment.  Skin Care Always use sunscreen that has not expired and with SPF (Sun Protection Factor) of 50 or higher. Wear hats to protect your head from the sun. Remember to use sunscreen on your hands, ears, face, & feet.  Use good moisturizing lotions such as udder cream, eucerin, or even Vaseline. Some chemotherapies can cause dry skin, color changes in your skin and nails.    Avoid long, hot showers or baths. Use gentle, fragrance-free soaps and laundry detergent. Use moisturizers, preferably creams or ointments rather than lotions because the thicker consistency is better at preventing skin dehydration. Apply the cream or ointment within 15 minutes of showering. Reapply moisturizer at night, and moisturize your hands every time after you wash them.  Hair Loss (if  your doctor says your hair will fall out)  If your doctor says that your hair is likely to fall out, decide before you begin chemo whether you want to wear a wig. You may want to shop before treatment to match your hair color. Hats, turbans, and scarves can also camouflage hair loss, although some people prefer to leave their heads uncovered. If you go bare-headed outdoors, be sure to use sunscreen on your scalp. Cut your hair short. It eases the inconvenience of shedding lots of hair, but it also can reduce the emotional impact of watching your hair fall out. Don't perm or color your hair during chemotherapy. Those chemical treatments are already damaging to hair and can enhance hair loss. Once your chemo treatments are done and your hair has grown back, it's OK to resume dyeing or perming hair.  With chemotherapy, hair loss  is almost always temporary. But when it grows back, it may be a different color or texture. In older adults who still had hair color before chemotherapy, the new growth may be completely gray.  Often, new hair is very fine and soft.  Infection Prevention Please wash your hands for at least 30 seconds using warm soapy water. Handwashing is the #1 way to prevent the spread of germs. Stay away from sick people or people who are getting over a cold. If you develop respiratory systems such as green/yellow mucus production or productive cough or persistent cough let us know and we will see if you need an antibiotic. It is a good idea to keep a pair of gloves on when going into grocery stores/[employer] to decrease your risk of coming into contact with germs on the carts, etc. Carry alcohol hand gel with you at all times and use it frequently if out in public. If your temperature reaches 100.5 or higher please call the clinic and let us know.  If it is after hours or on the weekend please go to the ER if your temperature is over 100.5.  Please have your own personal thermometer at home to use.     Sex and bodily fluids If you are going to have sex, a condom must be used to protect the person that isn't taking chemotherapy. Chemo can decrease your libido (sex drive). For a few days after chemotherapy, chemotherapy can be excreted through your bodily fluids.  When using the toilet please close the lid and flush the toilet twice.  Do this for a few day after you have had chemotherapy.   Effects of chemotherapy on your sex life Some changes are simple and won't last long. They won't affect your sex life permanently.  Sometimes you may feel: too tired not strong enough to be very active sick or sore  not in the mood anxious or low Your anxiety might not seem related to sex. For example, you may be worried about the cancer and how your treatment is going. Or you may be worried about money, or about how you family are coping with your illness.  These things can cause stress, which can affect your interest in sex. It's important to talk to your partner about how you feel.  Remember - the changes to your sex life don't usually last long. There's usually no medical reason to stop having sex during chemo. The drugs won't have any long term physical effects on your performance or enjoyment of sex. Cancer can't be passed on to your partner during sex  Contraception It's important to use reliable contraception during treatment. Avoid getting pregnant while you or your partner are having chemotherapy. This is because the drugs may harm the baby. Sometimes chemotherapy drugs can leave a man or woman infertile.  This means you would not be able to have children in the future. You might want to talk to someone about permanent infertility. It can be very difficult to learn that you may no longer be able to have children. Some people find counselling helpful. There might be ways to preserve your fertility, although this is easier for men than for women. You may want to speak to a fertility expert. You can talk  about sperm banking or harvesting your eggs. You can also ask about other fertility options, such as donor eggs. If you have or have had breast cancer, your doctor might advise you not to take the contraceptive pill. This is  because the hormones in it might affect the cancer. It is not known for sure whether or not chemotherapy drugs can be passed on through semen or secretions from the vagina. Because of this some doctors advise people to use a barrier method if you have sex during treatment. This applies to vaginal, anal or oral sex. Generally, doctors advise a barrier method only for the time you are actually having the treatment and for about a week after your treatment. Advice like this can be worrying, but this does not mean that you have to avoid being intimate with your partner. You can still have close contact with your partner and continue to enjoy sex.  Animals If you have cats or birds we just ask that you not change the litter or change the cage.  Please have someone else do this for you while you are on chemotherapy.   Food Safety During and After Cancer Treatment Food safety is important for people both during and after cancer treatment. Cancer and cancer treatments, such as chemotherapy, radiation therapy, and stem cell/bone marrow transplantation, often weaken the immune system. This makes it harder for your body to protect itself from foodborne illness, also called food poisoning. Foodborne illness is caused by eating food that contains harmful bacteria, parasites, or viruses.  Foods to avoid Some foods have a higher risk of becoming tainted with bacteria. These include: Unwashed fresh fruit and vegetables, especially leafy vegetables that can hide dirt and other contaminants Raw sprouts, such as alfalfa sprouts Raw or undercooked beef, especially ground beef, or other raw or undercooked meat and poultry Fatty, fried, or spicy foods immediately before or after treatment.  These can sit  heavy on your stomach and make you feel nauseous. Raw or undercooked shellfish, such as oysters. Sushi and sashimi, which often contain raw fish.  Unpasteurized beverages, such as unpasteurized fruit juices, raw milk, raw yogurt, or cider Undercooked eggs, such as soft boiled, over easy, and poached; raw, unpasteurized eggs; or foods made with raw egg, such as homemade raw cookie dough and homemade mayonnaise  Simple steps for food safety  Shop smart. Do not buy food stored or displayed in an unclean area. Do not buy bruised or damaged fruits or vegetables. Do not buy cans that have cracks, dents, or bulges. Pick up foods that can spoil at the end of your shopping trip and store them in a cooler on the way home.  Prepare and clean up foods carefully. Rinse all fresh fruits and vegetables under running water, and dry them with a clean towel or paper towel. Clean the top of cans before opening them. After preparing food, wash your hands for 20 seconds with hot water and soap. Pay special attention to areas between fingers and under nails. Clean your utensils and dishes with hot water and soap. Disinfect your kitchen and cutting boards using 1 teaspoon of liquid, unscented bleach mixed into 1 quart of water.    Dispose of old food. Eat canned and packaged food before its expiration date (the "use by" or "best before" date). Consume refrigerated leftovers within 3 to 4 days. After that time, throw out the food. Even if the food does not smell or look spoiled, it still may be unsafe. Some bacteria, such as Listeria, can grow even on foods stored in the refrigerator if they are kept for too long.  Take precautions when eating out. At restaurants, avoid buffets and salad bars where food sits out for a long time  and comes in contact with many people. Food can become contaminated when someone with a virus, often a norovirus, or another "bug" handles it. Put any leftover food in a "to-go" container  yourself, rather than having the server do it. And, refrigerate leftovers as soon as you get home. Choose restaurants that are clean and that are willing to prepare your food as you order it cooked.   AT HOME MEDICATIONS:                                                                                                                                                                Compazine/Prochlorperazine '10mg'$  tablet. Take 1 tablet every 6 hours as needed for nausea/vomiting. (This can make you sleepy)   EMLA cream. Apply a quarter size amount to port site 1 hour prior to chemo. Do not rub in. Cover with plastic wrap.    Diarrhea Sheet   If you are having loose stools/diarrhea, please purchase Imodium and begin taking as outlined:  At the first sign of poorly formed or loose stools you should begin taking Imodium (loperamide) 2 mg capsules.  Take two tablets ('4mg'$ ) followed by one tablet ('2mg'$ ) every 2 hours - DO NOT EXCEED 8 tablets in 24 hours.  If it is bedtime and you are having loose stools, take 2 tablets at bedtime, then 2 tablets every 4 hours until morning.   Always call the [facility] if you are having loose stools/diarrhea that you can't get under control.  Loose stools/diarrhea leads to dehydration (loss of water) in your body.  We have other options of trying to get the loose stools/diarrhea to stop but you must let us know!   Constipation Sheet  Colace - 100 mg capsules - take 2 capsules daily.  If this doesn't help then you can increase to 2 capsules twice daily.  Please call if the above does not work for you. Do not go more than 2 days without a bowel movement.  It is very important that you do not become constipated.  It will make you feel sick to your stomach (nausea) and can cause abdominal pain and vomiting.  Nausea Sheet   Compazine/Prochlorperazine '10mg'$  tablet. Take 1 tablet every 6 hours as needed for nausea/vomiting (This can make you drowsy).  If you are having  persistent nausea (nausea that does not stop) please call the [facility] and let us know the amount of nausea that you are experiencing.  If you begin to vomit, you need to call the [facility] and if it is the weekend and you have vomited more than one time and can't get it to stop-go to the Emergency Room.  Persistent nausea/vomiting can lead to dehydration (loss of fluid in your body) and will make you  feel very weak and unwell. Ice chips, sips of clear liquids, foods that are at room temperature, crackers, and toast tend to be better tolerated.   SYMPTOMS TO REPORT AS SOON AS POSSIBLE AFTER TREATMENT:  FEVER GREATER THAN 100.4 F  CHILLS WITH OR WITHOUT FEVER  NAUSEA AND VOMITING THAT IS NOT CONTROLLED WITH YOUR NAUSEA MEDICATION  UNUSUAL SHORTNESS OF BREATH  UNUSUAL BRUISING OR BLEEDING  TENDERNESS IN MOUTH AND THROAT WITH OR WITHOUT   PRESENCE OF ULCERS  URINARY PROBLEMS  BOWEL PROBLEMS  UNUSUAL RASH     Wear comfortable clothing and clothing appropriate for easy access to any Portacath or PICC line. Let us know if there is anything that we can do to make your therapy better!   What to do if you need assistance after hours or on the weekends: CALL [phone].  HOLD on the line, do not hang up.  You will hear multiple messages but at the end you will be connected with a nurse triage line.  They will contact the doctor if necessary.  Most of the time they will be able to assist you.  Do not call the hospital operator.     I have been informed and understand all of the instructions given to me and have received a copy. I have been instructed to call the clinic [phone] or my family physician as soon as possible for continued medical care, if indicated. I do not have any more questions at this time but understand that I may call the [facility] or the Patient Navigator at [phone] during office hours should I have questions or need assistance in obtaining  follow-up care.

## 2022-01-28 ENCOUNTER — Encounter (HOSPITAL_COMMUNITY): Payer: Self-pay

## 2022-01-28 ENCOUNTER — Ambulatory Visit (HOSPITAL_COMMUNITY)
Admission: RE | Admit: 2022-01-28 | Discharge: 2022-01-28 | Disposition: A | Discharge status: Home / Self Care | Payer: Medicare HMO | Source: Ambulatory Visit | Attending: Hematology | Admitting: Hematology

## 2022-01-28 ENCOUNTER — Other Ambulatory Visit: Payer: Self-pay

## 2022-01-28 DIAGNOSIS — Z950 Presence of cardiac pacemaker: Secondary | ICD-10-CM | POA: Diagnosis not present

## 2022-01-28 DIAGNOSIS — C259 Malignant neoplasm of pancreas, unspecified: Secondary | ICD-10-CM | POA: Diagnosis present

## 2022-01-28 HISTORY — PX: IR IMAGING GUIDED PORT INSERTION: IMG5740

## 2022-01-28 MED ORDER — HEPARIN SOD (PORK) LOCK FLUSH 100 UNIT/ML IV SOLN
INTRAVENOUS | Status: AC | PRN
  Administered 2022-01-28: 500 [IU]

## 2022-01-28 MED ORDER — HEPARIN SOD (PORK) LOCK FLUSH 100 UNIT/ML IV SOLN
INTRAVENOUS | Status: AC
  Filled 2022-01-28: qty 5

## 2022-01-28 MED ORDER — LIDOCAINE HCL (PF) 1 % IJ SOLN
INTRAMUSCULAR | Status: AC | PRN
  Administered 2022-01-28: 10 mL

## 2022-01-28 MED ORDER — MIDAZOLAM HCL 2 MG/2ML IJ SOLN
INTRAMUSCULAR | Status: AC | PRN
  Administered 2022-01-28: 1 mg via INTRAVENOUS

## 2022-01-28 MED ORDER — FENTANYL CITRATE (PF) 100 MCG/2ML IJ SOLN
INTRAMUSCULAR | Status: AC | PRN
  Administered 2022-01-28: 50 ug via INTRAVENOUS

## 2022-01-28 MED ORDER — MIDAZOLAM HCL 2 MG/2ML IJ SOLN
INTRAMUSCULAR | Status: AC
  Filled 2022-01-28: qty 2

## 2022-01-28 MED ORDER — FENTANYL CITRATE (PF) 100 MCG/2ML IJ SOLN
INTRAMUSCULAR | Status: AC
  Filled 2022-01-28: qty 2

## 2022-01-28 MED ORDER — SODIUM CHLORIDE 0.9 % IV SOLN: INTRAVENOUS | Status: DC

## 2022-01-28 MED ORDER — LIDOCAINE-EPINEPHRINE 1 %-1:100000 IJ SOLN
INTRAMUSCULAR | Status: AC
  Filled 2022-01-28: qty 1

## 2022-01-28 NOTE — H&P (Signed)
Chief Complaint: Pancreatic adenocarcinoma  Referring Physician(s): Delton Coombes  Supervising Physician: Sandi Mariscal  Patient Status: [facility] - Out-pt  History of Present Illness: Meagan [name] is a 87 y.o. female who presented with painless jaundice and a 13 pound weight loss in April of this year.  Her bilirubin was 8.4.  ERCP done 09/10/21 = Stent placement   On 10/03/2021 she had another ERCP with stent removal and stent placement for stent dysfunction with total bilirubin 16.3.  ERCP/EUS done 10/29/21 = Lower CBD stent stenosis from stricture, status post metal stent placement.  EUS showed mass in the superior pancreatic head measuring 2.8 x 2.2 cm, suggesting abutment of the portal vein.  Intact interface was seen between lesion and SMA and celiac trunk suggesting lack of invasion. Hypoechoic 14 x 12 mm lesion visualized in the liver.  Biopsy could not be attempted due to presence of blood vessel.  Pancreatic head FNA and biliary stricture brushing showed malignant cells consistent with adenocarcinoma.  Her CA 19-9 was 13.  MRI of the abdomen done 12/24/2021 showed a pancreatic head mass measures 2.7 x 2.3 cm (2.5 x 1.8 cm previously).  Mass is abutting and slightly displacing the SMA.    PET scan done 11/20/2021 showed ill-defined low-level hypermetabolic activity about the metallic CBD stent and pancreatic uncinate process without discrete CT correlate.  No convincing evidence of hypermetabolic disease in the neck, chest, abdomen or pelvis.  Tiny bilateral lung nodules measuring up to 3 mm.  She is here today for placement of a tunneled catheter with port for chemotherapy.  She is NPO. No nausea/vomiting. No Fever/chills. ROS negative.  She has a pacemaker on the left chest.   Past Medical History:  Diagnosis Date   Essential tremor    GERD (gastroesophageal reflux disease)    High cholesterol    Hypertension    PONV (postoperative nausea and vomiting)     Port-A-Cath in place 01/27/2022   Vertigo     Past Surgical History:  Procedure Laterality Date   BILIARY BRUSHING  09/10/2021   Procedure: BILIARY BRUSHING;  Surgeon: Irene Shipper, MD;  Location: [facility];  Service: Gastroenterology;;   BILIARY BRUSHING  10/29/2021   Procedure: BILIARY BRUSHING;  Surgeon: [place] Copas., MD;  Location: [facility];  Service: Gastroenterology;;   BILIARY DILATION  10/29/2021   Procedure: BILIARY DILATION;  Surgeon: [place] Copas., MD;  Location: [facility];  Service: Gastroenterology;;   BILIARY STENT PLACEMENT  09/10/2021   Procedure: BILIARY STENT PLACEMENT;  Surgeon: Irene Shipper, MD;  Location: [facility];  Service: Gastroenterology;;   BILIARY STENT PLACEMENT N/A 10/03/2021   Procedure: BILIARY STENT PLACEMENT;  Surgeon: Gatha Mayer, MD;  Location: [facility];  Service: Gastroenterology;  Laterality: N/A;   BILIARY STENT PLACEMENT N/A 10/29/2021   Procedure: BILIARY STENT PLACEMENT;  Surgeon: Rush Landmark Telford Nab., MD;  Location: [facility];  Service: Gastroenterology;  Laterality: N/A;   BIOPSY  10/29/2021   Procedure: BIOPSY;  Surgeon: Rush Landmark Telford Nab., MD;  Location: [facility];  Service: Gastroenterology;;   ENDOSCOPIC RETROGRADE CHOLANGIOPANCREATOGRAPHY (ERCP) WITH PROPOFOL N/A 10/29/2021   Procedure: ENDOSCOPIC RETROGRADE CHOLANGIOPANCREATOGRAPHY (ERCP) WITH PROPOFOL;  Surgeon: [place] Copas., MD;  Location: [facility];  Service: Gastroenterology;  Laterality: N/A;   ERCP N/A 09/10/2021   Procedure: ENDOSCOPIC RETROGRADE CHOLANGIOPANCREATOGRAPHY (ERCP);  Surgeon: Irene Shipper, MD;  Location: [facility];  Service: Gastroenterology;  Laterality: N/A;   ERCP N/A 10/03/2021   Procedure: ENDOSCOPIC RETROGRADE  CHOLANGIOPANCREATOGRAPHY (ERCP);  Surgeon: Gatha Mayer, MD;  Location: [facility];  Service: Gastroenterology;  Laterality: N/A;   ESOPHAGOGASTRODUODENOSCOPY (EGD) WITH PROPOFOL N/A  10/29/2021   Procedure: ESOPHAGOGASTRODUODENOSCOPY (EGD) WITH PROPOFOL;  Surgeon: Rush Landmark Telford Nab., MD;  Location: [facility];  Service: Gastroenterology;  Laterality: N/A;   EUS N/A 10/29/2021   Procedure: UPPER ENDOSCOPIC ULTRASOUND (EUS) RADIAL;  Surgeon: [place] Copas., MD;  Location: [facility];  Service: Gastroenterology;  Laterality: N/A;   FINE NEEDLE ASPIRATION  10/29/2021   Procedure: FINE NEEDLE ASPIRATION (FNA) LINEAR;  Surgeon: [place] Copas., MD;  Location: [facility];  Service: Gastroenterology;;   PACEMAKER IMPLANT     REMOVAL OF STONES  10/29/2021   Procedure: REMOVAL OF SLUDGE;  Surgeon: [place] Copas., MD;  Location: [facility];  Service: Gastroenterology;;   Joan Mayans  09/10/2021   Procedure: Joan Mayans;  Surgeon: Irene Shipper, MD;  Location[facility];  Service: Gastroenterology;;   Bess Kinds CHOLANGIOSCOPY N/A 10/29/2021   Procedure: DTOIZTIW CHOLANGIOSCOPY;  Surgeon: [place] Copas., MD;  Location: [facility];  Service: Gastroenterology;  Laterality: N/A;   STENT REMOVAL  10/03/2021   Procedure: STENT REMOVAL;  Surgeon: Gatha Mayer, MD;  Location: [facility];  Service: Gastroenterology;;    Allergies: Ivp dye [iodinated contrast media] and Lisinopril  Medications: Prior to Admission medications   Medication Sig Start Date End Date Taking? Authorizing Provider  amLODipine (NORVASC) 2.5 MG tablet Take 2.5 mg by mouth at bedtime.   Yes [provider]  aspirin EC 81 MG tablet Take 81 mg by mouth daily. Swallow whole.   Yes [provider]  Cholecalciferol (VITAMIN D3) 50 MCG (2000 UT) TABS Take 2,000 Units by mouth daily after supper.   Yes [provider]  Coenzyme Q10 (CO Q-10) 100 MG CAPS Take 100 mg by mouth every evening.   Yes [provider]  famotidine (PEPCID) 40 MG tablet Take 40 mg by mouth every evening. 10/02/21  Yes [provider]  gabapentin  (NEURONTIN) 100 MG capsule Take 100 mg by mouth See admin instructions. Take 100 mg twice daily, may take a third 100 mg dose as needed for pain   Yes [provider]  Misc Natural Products (JOINT SUPPORT) CAPS Take 1 capsule by mouth daily with breakfast.   Yes [provider]  Multiple Vitamin (MULTIVITAMIN) tablet Take 1 tablet by mouth daily with breakfast.   Yes [provider]  Multiple Vitamins-Minerals (Bode) CAPS Take 1 capsule by mouth daily.   Yes [provider]  nebivolol (BYSTOLIC) 5 MG tablet Take 2.5 mg by mouth in the morning.   Yes [provider]  omeprazole (PRILOSEC) 40 MG capsule Take 40 mg by mouth daily before breakfast.   Yes [provider]  primidone (MYSOLINE) 50 MG tablet Take 25 mg by mouth daily as needed (for tremors).   Yes [provider]  pyridOXINE (VITAMIN B-6) 100 MG tablet Take 50 mg by mouth in the morning.   Yes [provider]  tiZANidine (ZANAFLEX) 2 MG tablet Take 1 mg by mouth daily as needed for muscle spasms.   Yes [provider]  diphenhydrAMINE (BENADRYL ALLERGY) 25 MG tablet Take 1 tablet (25 mg total) by mouth once for 1 dose. Take 1 hour prior to scan along with Prednisone Patient not taking: Reported on 01/23/2022 01/08/22 01/23/22  Derek Jack, MD  GEMCITABINE HCL IV Inject into the vein every 14 (fourteen) days. 02/05/22  [provider]  lidocaine-prilocaine (EMLA) cream Apply a small amount to port a cath site (do not rub in) and cover with plastic wrap 1 hour prior to infusion appointments 01/27/22   Derek Jack, MD  LORazepam (ATIVAN) 1 MG tablet Take 0.5 tablets (0.5 mg total) by mouth as needed for anxiety. Take one hour prior to MRI and again 30 min before if needed. Patient not taking: Reported on 01/23/2022 12/18/21   Derek Jack, MD  meclizine (ANTIVERT) 25 MG tablet Take 25 mg by mouth daily as needed for  dizziness.    [provider]  ondansetron (ZOFRAN-ODT) 4 MG disintegrating tablet Take 4 mg by mouth every 8 (eight) hours as needed for nausea or vomiting. 10/01/21   [provider]  PACLitaxel-protein bound in Empty Containers Flexible 1 each Inject into the vein every 14 (fourteen) days. 02/05/22   [provider]  polyethylene glycol (MIRALAX) 17 g packet Take 17 g by mouth daily as needed. Patient taking differently: Take 17 g by mouth daily as needed for moderate constipation. 09/11/21   Shelly Coss, MD  Polyvinyl Alcohol-Povidone (REFRESH OP) Place 1 drop into both eyes daily as needed (tired eyes).    [provider]  predniSONE (DELTASONE) 20 MG tablet Take one 20 mg tablet 13 hours, then 7 hours, and then again 1 hour prior to scan Patient not taking: Reported on 01/23/2022 01/08/22   Derek Jack, MD  prochlorperazine (COMPAZINE) 10 MG tablet Take 1 tablet (10 mg total) by mouth every 6 (six) hours as needed for nausea or vomiting. 01/27/22   Derek Jack, MD     Family History  Problem Relation Age of Onset   Stomach cancer Neg Hx    Esophageal cancer Neg Hx    Colon cancer Neg Hx    Pancreatic cancer Neg Hx     Social History   Socioeconomic History   Marital status: Married    Spouse name: Not on file   Number of children: Not on file   Years of education: Not on file   Highest education level: Not on file  Occupational History   Not on file  Tobacco Use   Smoking status: Never   Smokeless tobacco: Never  Vaping Use   Vaping Use: Never used  Substance and Sexual Activity   Alcohol use: Never   Drug use: Never   Sexual activity: Not on file  Other Topics Concern   Not on file  Social History Narrative   Not on file   Social Determinants of Health   Financial Resource Strain: Not on file  Food Insecurity: Not on file  Transportation Needs: Not on file  Physical Activity: Not on file  Stress: Not on file   Social Connections: Not on file     Review of Systems: A 12 point ROS discussed and pertinent positives are indicated in the HPI above.  All other systems are negative.  Review of Systems  Vital Signs: BP (!) 163/73   Pulse 78   Temp (!) 97.2 F (36.2 C)   Resp 17   Ht '5\' 7"'$  (1.702 m)   Wt 108 lb (49 kg)   SpO2 100%   BMI 16.92 kg/m   Physical Exam Vitals reviewed.  Constitutional:      Appearance: Normal appearance.  HENT:     Head: Normocephalic and atraumatic.  Eyes:     Extraocular Movements: Extraocular movements intact.  Cardiovascular:     Rate and Rhythm: Normal  rate and regular rhythm.     Comments: Pacemaker left chest Pulmonary:     Effort: Pulmonary effort is normal. No respiratory distress.     Breath sounds: Normal breath sounds.  Abdominal:     Palpations: Abdomen is soft.  Musculoskeletal:        General: Normal range of motion.     Cervical back: Normal range of motion.  Skin:    General: Skin is warm and dry.  Neurological:     General: No focal deficit present.     Mental Status: She is alert and oriented to person, place, and time.  Psychiatric:        Mood and Affect: Mood normal.        Behavior: Behavior normal.        Thought Content: Thought content normal.        Judgment: Judgment normal.     Imaging: CT ABDOMEN W WO CONTRAST  Result Date: 01/19/2022 CLINICAL DATA:  Pancreatic head cancer, staging. * Tracking Code: BO *. EXAM: CT ABDOMEN WITHOUT AND WITH CONTRAST TECHNIQUE: Multidetector CT imaging of the abdomen was performed following the standard protocol before and following the bolus administration of intravenous contrast. RADIATION DOSE REDUCTION: This exam was performed according to the departmental dose-optimization program which includes automated exposure control, adjustment of the mA and/or kV according to patient size and/or use of iterative reconstruction technique. CONTRAST:  34m OMNIPAQUE IOHEXOL 300 MG/ML  SOLN  COMPARISON:  Multiple priors including CT chest abdomen and pelvis dated November 10, 2021, nuclear medicine PET scan November 20, 2021 and MRI abdomen December 24, 2021. FINDINGS: Lower chest: Bibasilar atelectasis/scarring. Hepatobiliary: No suspicious hepatic lesion. Gallbladder is nondistended. Similar pericholecystic fluid around the gallbladder fundus with a new enhancing focus of nodular soft tissue along the posterior aspect of the gallbladder wall measuring 9 mm on image 80/11. Metallic stent in the common bile duct with pneumobilia consistent with stent patency. No biliary ductal dilation. Pancreas: Hypoenhancing mass in the pancreatic head measures 2.9 x 1.8 cm on image 41/4. Mild prominence of the pancreatic duct without significant distal gland atrophy. The mass abuts the SMA without evidence of encasement and best seen on axial image 38/4. Additionally there is slightly greater than 180 degrees of abutment of the portal vein by the mass with some focal effacement of the vein but without definite CT evidence of encasement and best seen on sagittal image 50/8. Spleen: No splenomegaly or focal splenic lesion. Adrenals/Urinary Tract: Bilateral adrenal glands appear normal. Bilateral renal cysts are considered benign and require no independent imaging follow-up. Bilateral cortical renal scarring. Stomach/Bowel: No radiopaque enteric contrast material was administered. Stomach is minimally distended limiting evaluation. No pathologic dilation or evidence of acute inflammation involving loops of large or small bowel in the abdomen. Vascular/Lymphatic: Aortic atherosclerosis. Prominent retroperitoneal lymph nodes measuring up to 6 mm on image 62/11 are similar prior. Other: Mild mesenteric and omental stranding. Musculoskeletal: Multilevel degenerative changes spine. No aggressive lytic or blastic lesion of bone. IMPRESSION: 1. Hypoenhancing mass in the pancreatic head measuring 2.9 x 1.8 cm is consistent with the  patient's known primary pancreatic neoplasm. 2. The pancreatic neoplasm demonstrates slightly greater than 180 degrees of abutment of the portal vein as well as abutment of the SMA but without definite vascular encasement. 3. Similar trace pericholecystic fluid along the gallbladder fundus with a new enhancing 9 mm soft tissue nodule along the posterior aspect of the gallbladder wall, suspicious for a peritoneal implant. 4.  Prominent retroperitoneal lymph nodes measuring up to 6 mm are similar prior and while not hypermetabolic on prior PET-CT are below the resolution of that examination, attention on follow-up imaging suggested. 5. Metallic stent in the common bile duct with pneumobilia consistent with stent patency. 6.  Aortic Atherosclerosis (ICD10-I70.0). Electronically Signed   By: Dahlia Bailiff M.D.   On: 01/19/2022 16:54    Labs:  CBC: Recent Labs    10/03/21 1321 10/04/21 0612 11/10/21 1424 01/08/22 1126  WBC 20.3* 13.4* 7.8 7.7  HGB 11.6* 9.3* 10.8* 11.2*  HCT 34.1* 27.2* 33.4* 34.7*  PLT 418* 310 394 256    COAGS: Recent Labs    09/07/21 1725 10/03/21 1321  INR 1.1 1.0    BMP: Recent Labs    10/08/21 1552 10/23/21 1534 11/10/21 1424 01/08/22 1126  NA 132* 134* 136 136  K 4.1 4.0 4.7 4.1  CL 98 103 104 105  CO2 '26 26 25 24  '$ GLUCOSE 118* 129* 113* 159*  BUN 22 21 27* 33*  CALCIUM 9.2 9.1 10.3 10.1  CREATININE 1.27* 1.30* 1.59* 1.25*  GFRNONAA 41* 40* 32* 42*    LIVER FUNCTION TESTS: Recent Labs    10/08/21 1552 10/23/21 1534 11/10/21 1424 01/08/22 1126  BILITOT 5.3* 2.4* 1.4* 0.7  AST 64* 37 29 32  ALT 102* 74* 29 36  ALKPHOS 439* 530* 223* 134*  PROT 6.1* 6.3* 6.9 7.1  ALBUMIN 2.5* 2.7* 3.2* 3.7    TUMOR MARKERS: No results for input(s): "AFPTM", "CEA", "CA199", "CHROMGRNA" in the last 8760 hours.  Assessment and Plan:  Pancreatic adenocarcinoma  Will proceed with image guided placement of a tunneled catheter with port today by Dr.  Pascal Lux.  Risks and benefits of image guided port-a-catheter placement was discussed with the patient including, but not limited to bleeding, infection, pneumothorax, or fibrin sheath development and need for additional procedures.  All of the patient's questions were answered, patient is agreeable to proceed. Consent signed and in chart.  Thank you for allowing our service to participate in Meagan [name] 's care.  Electronically Signed: Murrell Redden, PA-C   01/28/2022, 10:38 AM      I spent a total of  30 Minutes in face to face in clinical consultation, greater than 50% of which was counseling/coordinating care for [facility] A Cath placement.

## 2022-01-28 NOTE — Procedures (Signed)
Pre Procedure Dx: Poor venous access Post Procedural Dx: Same  Successful placement of right IJ approach port-a-cath with tip at the superior caval atrial junction. The catheter is ready for immediate use.  Estimated Blood Loss: Trace  Complications: None immediate.  Jay Ladean Steinmeyer, MD Pager #: [phone]   

## 2022-01-29 ENCOUNTER — Inpatient Hospital Stay: Payer: Medicare HMO

## 2022-01-29 DIAGNOSIS — Z95828 Presence of other vascular implants and grafts: Secondary | ICD-10-CM

## 2022-01-29 DIAGNOSIS — C259 Malignant neoplasm of pancreas, unspecified: Secondary | ICD-10-CM

## 2022-01-29 NOTE — Progress Notes (Signed)

## 2022-02-03 NOTE — Progress Notes (Signed)
Pharmacist Chemotherapy Monitoring - Initial Assessment    Anticipated start date: 02/06/22   The following has been reviewed per standard work regarding the patient's treatment regimen: The patient's diagnosis, treatment plan and drug doses, and organ/hematologic function Lab orders and baseline tests specific to treatment regimen  The treatment plan start date, drug sequencing, and pre-medications Prior authorization status  Patient's documented medication list, including drug-drug interaction screen and prescriptions for anti-emetics and supportive care specific to the treatment regimen The drug concentrations, fluid compatibility, administration routes, and timing of the medications to be used The patient's access for treatment and lifetime cumulative dose history, if applicable  The patient's medication allergies and previous infusion related reactions, if applicable   Changes made to treatment plan:  N/A  Follow up needed:  Pending authorization for treatment    Wynona Neat, [facility], 02/03/2022  12:30 PM

## 2022-02-04 ENCOUNTER — Other Ambulatory Visit: Payer: Self-pay

## 2022-02-04 DIAGNOSIS — C259 Malignant neoplasm of pancreas, unspecified: Secondary | ICD-10-CM

## 2022-02-05 ENCOUNTER — Inpatient Hospital Stay: Payer: Medicare HMO

## 2022-02-05 ENCOUNTER — Inpatient Hospital Stay: Payer: Medicare HMO | Attending: Hematology

## 2022-02-05 VITALS — BP 136/60 | HR 74 | Temp 97.9°F | Resp 16

## 2022-02-05 DIAGNOSIS — Z950 Presence of cardiac pacemaker: Secondary | ICD-10-CM | POA: Diagnosis not present

## 2022-02-05 DIAGNOSIS — R634 Abnormal weight loss: Secondary | ICD-10-CM | POA: Insufficient documentation

## 2022-02-05 DIAGNOSIS — Z95828 Presence of other vascular implants and grafts: Secondary | ICD-10-CM

## 2022-02-05 DIAGNOSIS — Z5111 Encounter for antineoplastic chemotherapy: Secondary | ICD-10-CM | POA: Diagnosis present

## 2022-02-05 DIAGNOSIS — I10 Essential (primary) hypertension: Secondary | ICD-10-CM | POA: Insufficient documentation

## 2022-02-05 DIAGNOSIS — Z803 Family history of malignant neoplasm of breast: Secondary | ICD-10-CM | POA: Diagnosis not present

## 2022-02-05 DIAGNOSIS — Z808 Family history of malignant neoplasm of other organs or systems: Secondary | ICD-10-CM | POA: Insufficient documentation

## 2022-02-05 DIAGNOSIS — C259 Malignant neoplasm of pancreas, unspecified: Secondary | ICD-10-CM

## 2022-02-05 DIAGNOSIS — K5900 Constipation, unspecified: Secondary | ICD-10-CM | POA: Insufficient documentation

## 2022-02-05 DIAGNOSIS — R42 Dizziness and giddiness: Secondary | ICD-10-CM | POA: Insufficient documentation

## 2022-02-05 DIAGNOSIS — Z8041 Family history of malignant neoplasm of ovary: Secondary | ICD-10-CM | POA: Diagnosis not present

## 2022-02-05 DIAGNOSIS — C250 Malignant neoplasm of head of pancreas: Secondary | ICD-10-CM | POA: Diagnosis present

## 2022-02-05 DIAGNOSIS — K7689 Other specified diseases of liver: Secondary | ICD-10-CM | POA: Insufficient documentation

## 2022-02-05 LAB — COMPREHENSIVE METABOLIC PANEL
ALT: 95 U/L — ABNORMAL HIGH (ref 0–44)
AST: 56 U/L — ABNORMAL HIGH (ref 15–41)
Albumin: 3.5 g/dL (ref 3.5–5.0)
Alkaline Phosphatase: 235 U/L — ABNORMAL HIGH (ref 38–126)
Anion gap: 6 (ref 5–15)
BUN: 33 mg/dL — ABNORMAL HIGH (ref 8–23)
CO2: 24 mmol/L (ref 22–32)
Calcium: 9.8 mg/dL (ref 8.9–10.3)
Chloride: 108 mmol/L (ref 98–111)
Creatinine, Ser: 1.15 mg/dL — ABNORMAL HIGH (ref 0.44–1.00)
GFR, Estimated: 47 mL/min — ABNORMAL LOW (ref >60)
Glucose, Bld: 98 mg/dL (ref 70–99)
Potassium: 4.5 mmol/L (ref 3.5–5.1)
Sodium: 138 mmol/L (ref 135–145)
Total Bilirubin: 0.9 mg/dL (ref 0.3–1.2)
Total Protein: 6.7 g/dL (ref 6.5–8.1)

## 2022-02-05 LAB — CBC WITH DIFFERENTIAL/PLATELET
Abs Immature Granulocytes: 0.02 10*3/uL (ref 0.00–0.07)
Basophils Absolute: 0.1 10*3/uL (ref 0.0–0.1)
Basophils Relative: 1 %
Eosinophils Absolute: 0.2 10*3/uL (ref 0.0–0.5)
Eosinophils Relative: 3 %
HCT: 33 % — ABNORMAL LOW (ref 36.0–46.0)
Hemoglobin: 10.6 g/dL — ABNORMAL LOW (ref 12.0–15.0)
Immature Granulocytes: 0 %
Lymphocytes Relative: 25 %
Lymphs Abs: 1.7 10*3/uL (ref 0.7–4.0)
MCH: 29.6 pg (ref 26.0–34.0)
MCHC: 32.1 g/dL (ref 30.0–36.0)
MCV: 92.2 fL (ref 80.0–100.0)
Monocytes Absolute: 0.9 10*3/uL (ref 0.1–1.0)
Monocytes Relative: 14 %
Neutro Abs: 3.9 10*3/uL (ref 1.7–7.7)
Neutrophils Relative %: 57 %
Platelets: 228 10*3/uL (ref 150–400)
RBC: 3.58 MIL/uL — ABNORMAL LOW (ref 3.87–5.11)
RDW: 15.2 % (ref 11.5–15.5)
WBC: 6.7 10*3/uL (ref 4.0–10.5)
nRBC: 0 % (ref 0.0–0.2)

## 2022-02-05 LAB — MAGNESIUM: Magnesium: 2.2 mg/dL (ref 1.7–2.4)

## 2022-02-05 MED ORDER — SODIUM CHLORIDE 0.9 % IV SOLN
500.0000 mg/m2 | Freq: Once | INTRAVENOUS | Status: AC
  Administered 2022-02-05: 760 mg via INTRAVENOUS
  Filled 2022-02-05: qty 19.99

## 2022-02-05 MED ORDER — SODIUM CHLORIDE 0.9 % IV SOLN
10.0000 mg | Freq: Once | INTRAVENOUS | Status: AC
  Administered 2022-02-05: 10 mg via INTRAVENOUS
  Filled 2022-02-05: qty 10

## 2022-02-05 MED ORDER — PACLITAXEL PROTEIN-BOUND CHEMO INJECTION 100 MG
100.0000 mg/m2 | Freq: Once | INTRAVENOUS | Status: AC
  Administered 2022-02-05: 150 mg via INTRAVENOUS
  Filled 2022-02-05: qty 30

## 2022-02-05 MED ORDER — SODIUM CHLORIDE 0.9% FLUSH
10.0000 mL | INTRAVENOUS | Status: DC | PRN
  Administered 2022-02-05: 10 mL

## 2022-02-05 MED ORDER — HEPARIN SOD (PORK) LOCK FLUSH 100 UNIT/ML IV SOLN
500.0000 [IU] | Freq: Once | INTRAVENOUS | Status: AC | PRN
  Administered 2022-02-05: 500 [IU]

## 2022-02-05 MED ORDER — SODIUM CHLORIDE 0.9 % IV SOLN: Freq: Once | INTRAVENOUS | Status: AC

## 2022-02-05 MED ORDER — PALONOSETRON HCL INJECTION 0.25 MG/5ML
0.2500 mg | Freq: Once | INTRAVENOUS | Status: AC
  Administered 2022-02-05: 0.25 mg via INTRAVENOUS
  Filled 2022-02-05: qty 5

## 2022-02-05 NOTE — Patient Instructions (Signed)
[facility]  Discharge Instructions: Thank you for choosing [facility] to provide your oncology and hematology care.  If you have a lab appointment with the [facility], please come in thru the Main Entrance and check in at the main information desk.  Wear comfortable clothing and clothing appropriate for easy access to any Portacath or PICC line.   We strive to give you quality time with your provider. You may need to reschedule your appointment if you arrive late (15 or more minutes).  Arriving late affects you and other patients whose appointments are after yours.  Also, if you miss three or more appointments without notifying the office, you may be dismissed from the clinic at the provider's discretion.      For prescription refill requests, have your pharmacy contact our office and allow 72 hours for refills to be completed.    Today you received the following chemotherapy and/or immunotherapy agents Abraxane and Gemzar      To help prevent nausea and vomiting after your treatment, we encourage you to take your nausea medication as directed.  BELOW ARE SYMPTOMS THAT SHOULD BE REPORTED IMMEDIATELY: *FEVER GREATER THAN 100.4 F (38 C) OR HIGHER *CHILLS OR SWEATING *NAUSEA AND VOMITING THAT IS NOT CONTROLLED WITH YOUR NAUSEA MEDICATION *UNUSUAL SHORTNESS OF BREATH *UNUSUAL BRUISING OR BLEEDING *URINARY PROBLEMS (pain or burning when urinating, or frequent urination) *BOWEL PROBLEMS (unusual diarrhea, constipation, pain near the anus) TENDERNESS IN MOUTH AND THROAT WITH OR WITHOUT PRESENCE OF ULCERS (sore throat, sores in mouth, or a toothache) UNUSUAL RASH, SWELLING OR PAIN  UNUSUAL VAGINAL DISCHARGE OR ITCHING   Items with * indicate a potential emergency and should be followed up as soon as possible or go to the Emergency Department if any problems should occur.  Please show the CHEMOTHERAPY ALERT CARD or IMMUNOTHERAPY ALERT CARD at check-in to  the Emergency Department and triage nurse.  Should you have questions after your visit or need to cancel or reschedule your appointment, please contact [facility] [phone]  and follow the prompts.  Office hours are 8:00 a.m. to 4:30 p.m. Monday - Friday. Please note that voicemails left after 4:00 p.m. may not be returned until the following business day.  We are closed weekends and major holidays. You have access to a nurse at all times for urgent questions. Please call the main number to the clinic [phone] and follow the prompts.  For any non-urgent questions, you may also contact your provider using MyChart. We now offer e-Visits for anyone 57 and older to request care online for non-urgent symptoms. For details visit [email or web address].   Also download the MyChart app! Go to the app store, search "MyChart", open the app, select [facility], and log in with your MyChart username and password.  Masks are optional in the cancer centers. If you would like for your care team to wear a mask while they are taking care of you, please let them know. You may have one support person who is at least 85 years old accompany you for your appointments.

## 2022-02-05 NOTE — Progress Notes (Signed)
Patient presents today for D1C1 Abraxane/Gemzar infusions per providers order.  Vital signs within parameters for treatment.  Labs pending.  Patient has no new complaints at this time.  Labs reviewed and ALT noted to be 95, [facility] notified.  Message received from Anastasio Champion RN/Dr. Delton Coombes, patient okay for treatment.  Treatment given today per MD orders.  Stable during infusion without adverse affects.  Vital signs stable.  No complaints at this time.  Discharge from clinic ambulatory in stable condition.  Alert and oriented X 3.  Follow up with [facility] as scheduled.

## 2022-02-05 NOTE — Patient Instructions (Signed)
[facility]  Discharge Instructions: Thank you for choosing [facility] to provide your oncology and hematology care.  If you have a lab appointment with the Cancer Center, please come in thru the Main Entrance and check in at the main information desk.  Wear comfortable clothing and clothing appropriate for easy access to any Portacath or PICC line.   We strive to give you quality time with your provider. You may need to reschedule your appointment if you arrive late (15 or more minutes).  Arriving late affects you and other patients whose appointments are after yours.  Also, if you miss three or more appointments without notifying the office, you may be dismissed from the clinic at the provider's discretion.      For prescription refill requests, have your pharmacy contact our office and allow 72 hours for refills to be completed.    Today you received the following chemotherapy and/or immunotherapy agents port flush.      To help prevent nausea and vomiting after your treatment, we encourage you to take your nausea medication as directed.  BELOW ARE SYMPTOMS THAT SHOULD BE REPORTED IMMEDIATELY: *FEVER GREATER THAN 100.4 F (38 C) OR HIGHER *CHILLS OR SWEATING *NAUSEA AND VOMITING THAT IS NOT CONTROLLED WITH YOUR NAUSEA MEDICATION *UNUSUAL SHORTNESS OF BREATH *UNUSUAL BRUISING OR BLEEDING *URINARY PROBLEMS (pain or burning when urinating, or frequent urination) *BOWEL PROBLEMS (unusual diarrhea, constipation, pain near the anus) TENDERNESS IN MOUTH AND THROAT WITH OR WITHOUT PRESENCE OF ULCERS (sore throat, sores in mouth, or a toothache) UNUSUAL RASH, SWELLING OR PAIN  UNUSUAL VAGINAL DISCHARGE OR ITCHING   Items with * indicate a potential emergency and should be followed up as soon as possible or go to the Emergency Department if any problems should occur.  Please show the CHEMOTHERAPY ALERT CARD or IMMUNOTHERAPY ALERT CARD at check-in to the  Emergency Department and triage nurse.  Should you have questions after your visit or need to cancel or reschedule your appointment, please contact [facility] [phone]  and follow the prompts.  Office hours are 8:00 a.m. to 4:30 p.m. Monday - Friday. Please note that voicemails left after 4:00 p.m. may not be returned until the following business day.  We are closed weekends and major holidays. You have access to a nurse at all times for urgent questions. Please call the main number to the clinic [phone] and follow the prompts.  For any non-urgent questions, you may also contact your provider using MyChart. We now offer e-Visits for anyone 18 and older to request care online for non-urgent symptoms. For details visit [email or web address].   Also download the MyChart app! Go to the app store, search "MyChart", open the app, select [facility], and log in with your MyChart username and password.  Masks are optional in the cancer centers. If you would like for your care team to wear a mask while they are taking care of you, please let them know. You may have one support person who is at least 85 years old accompany you for your appointments.  

## 2022-02-05 NOTE — Progress Notes (Signed)
Patients port flushed without difficulty.  Good blood return noted with no bruising or swelling noted at site.  Stable during access and blood draw.  Patient to remain accessed for treatment. 

## 2022-02-09 NOTE — Progress Notes (Signed)
Late entry, 24 hour call back.  No answer.  Mailbox full.

## 2022-02-11 ENCOUNTER — Encounter: Payer: Self-pay | Admitting: Licensed Clinical Social Worker

## 2022-02-11 ENCOUNTER — Other Ambulatory Visit: Payer: Self-pay

## 2022-02-11 DIAGNOSIS — Z1379 Encounter for other screening for genetic and chromosomal anomalies: Secondary | ICD-10-CM | POA: Insufficient documentation

## 2022-02-11 DIAGNOSIS — C259 Malignant neoplasm of pancreas, unspecified: Secondary | ICD-10-CM

## 2022-02-12 ENCOUNTER — Inpatient Hospital Stay (HOSPITAL_BASED_OUTPATIENT_CLINIC_OR_DEPARTMENT_OTHER): Payer: Medicare HMO | Admitting: Physician Assistant

## 2022-02-12 ENCOUNTER — Inpatient Hospital Stay: Payer: Medicare HMO

## 2022-02-12 ENCOUNTER — Telehealth: Payer: Medicare HMO | Admitting: Licensed Clinical Social Worker

## 2022-02-12 ENCOUNTER — Telehealth: Payer: Self-pay | Admitting: Licensed Clinical Social Worker

## 2022-02-12 DIAGNOSIS — C259 Malignant neoplasm of pancreas, unspecified: Secondary | ICD-10-CM | POA: Diagnosis not present

## 2022-02-12 DIAGNOSIS — Z5111 Encounter for antineoplastic chemotherapy: Secondary | ICD-10-CM | POA: Diagnosis not present

## 2022-02-12 LAB — CBC WITH DIFFERENTIAL/PLATELET
Abs Immature Granulocytes: 0.02 10*3/uL (ref 0.00–0.07)
Basophils Absolute: 0 10*3/uL (ref 0.0–0.1)
Basophils Relative: 1 %
Eosinophils Absolute: 0.1 10*3/uL (ref 0.0–0.5)
Eosinophils Relative: 1 %
HCT: 30.2 % — ABNORMAL LOW (ref 36.0–46.0)
Hemoglobin: 9.6 g/dL — ABNORMAL LOW (ref 12.0–15.0)
Immature Granulocytes: 0 %
Lymphocytes Relative: 26 %
Lymphs Abs: 1.5 10*3/uL (ref 0.7–4.0)
MCH: 29.6 pg (ref 26.0–34.0)
MCHC: 31.8 g/dL (ref 30.0–36.0)
MCV: 93.2 fL (ref 80.0–100.0)
Monocytes Absolute: 0.4 10*3/uL (ref 0.1–1.0)
Monocytes Relative: 6 %
Neutro Abs: 3.6 10*3/uL (ref 1.7–7.7)
Neutrophils Relative %: 66 %
Platelets: 149 10*3/uL — ABNORMAL LOW (ref 150–400)
RBC: 3.24 MIL/uL — ABNORMAL LOW (ref 3.87–5.11)
RDW: 14.6 % (ref 11.5–15.5)
WBC: 5.5 10*3/uL (ref 4.0–10.5)
nRBC: 0 % (ref 0.0–0.2)

## 2022-02-12 LAB — COMPREHENSIVE METABOLIC PANEL
ALT: 62 U/L — ABNORMAL HIGH (ref 0–44)
AST: 28 U/L (ref 15–41)
Albumin: 3.2 g/dL — ABNORMAL LOW (ref 3.5–5.0)
Alkaline Phosphatase: 307 U/L — ABNORMAL HIGH (ref 38–126)
Anion gap: 6 (ref 5–15)
BUN: 33 mg/dL — ABNORMAL HIGH (ref 8–23)
CO2: 25 mmol/L (ref 22–32)
Calcium: 9.9 mg/dL (ref 8.9–10.3)
Chloride: 102 mmol/L (ref 98–111)
Creatinine, Ser: 1.17 mg/dL — ABNORMAL HIGH (ref 0.44–1.00)
GFR, Estimated: 46 mL/min — ABNORMAL LOW (ref >60)
Glucose, Bld: 138 mg/dL — ABNORMAL HIGH (ref 70–99)
Potassium: 4.5 mmol/L (ref 3.5–5.1)
Sodium: 133 mmol/L — ABNORMAL LOW (ref 135–145)
Total Bilirubin: 0.8 mg/dL (ref 0.3–1.2)
Total Protein: 6.5 g/dL (ref 6.5–8.1)

## 2022-02-12 LAB — MAGNESIUM: Magnesium: 2.1 mg/dL (ref 1.7–2.4)

## 2022-02-12 MED ORDER — SODIUM CHLORIDE 0.9% FLUSH
10.0000 mL | Freq: Once | INTRAVENOUS | Status: AC
  Administered 2022-02-12: 10 mL via INTRAVENOUS

## 2022-02-12 MED ORDER — HEPARIN SOD (PORK) LOCK FLUSH 100 UNIT/ML IV SOLN
500.0000 [IU] | Freq: Once | INTRAVENOUS | Status: AC
  Administered 2022-02-12: 500 [IU] via INTRAVENOUS

## 2022-02-12 NOTE — Progress Notes (Signed)
Patients port flushed without difficulty.  Good blood return noted with no bruising or swelling noted at site.  Patient remains accessed for any possible treatment.   

## 2022-02-12 NOTE — Addendum Note (Signed)
Addended by: Fayne Norrie on: 02/12/2022 03:31 PM   Modules accepted: Orders

## 2022-02-12 NOTE — Telephone Encounter (Signed)
Patient's daughter called requesting to cancel genetics appointment on 02/12/2022.  Appointment cancelled.

## 2022-02-12 NOTE — Progress Notes (Signed)
[place], [place] [address]   CLINIC:  Medical Oncology/Hematology  PCP:  Meagan [name], [place] / [place] [address] [phone]   REASON FOR VISIT:  Follow-up for pancreatic adenocarcinoma  PRIOR THERAPY: none  NGS Results: not done  CURRENT THERAPY: under work-up  BRIEF ONCOLOGIC HISTORY:  Oncology History  Pancreatic carcinoma ([place])  11/11/2021 Initial Diagnosis   Pancreatic carcinoma ([place])   02/05/2022 -  Chemotherapy   Patient is on Treatment Plan : PANCREATIC Abraxane D1,8,15 + Gemcitabine D1,8,15 q28d       CANCER STAGING:  Cancer Staging  Pancreatic carcinoma ([place]) Staging form: Exocrine Pancreas, AJCC 8th Edition - Clinical stage from 11/11/2021: Stage IB (cT2, cN0, cM0) - Unsigned   INTERVAL HISTORY:  [name], a 87 y.o. female, seen for follow-up of pancreatic cancer.  She reports that she is doing very well in terms of energy.  She is also taking care of her husband at home who has dementia.  She is able to do all her household activities.  No abdominal pain reported.  Eating is also improving.  REVIEW OF SYSTEMS:  Review of Systems  Constitutional:  Negative for appetite change, fatigue and unexpected weight change.  Gastrointestinal:  Negative for abdominal pain.  Neurological:  Negative for numbness.  All other systems reviewed and are negative.   PAST MEDICAL/SURGICAL HISTORY:  Past Medical History:  Diagnosis Date  . Essential tremor   . GERD (gastroesophageal reflux disease)   . High cholesterol   . Hypertension   . PONV (postoperative nausea and vomiting)   . Port-A-Cath in place 01/27/2022  . Vertigo    Past Surgical History:  Procedure Laterality Date  . BILIARY BRUSHING  09/10/2021   Procedure: BILIARY BRUSHING;  Surgeon: Currie Dennin Shipper, MD;  Location: [facility];  Service: Gastroenterology;;  . BILIARY BRUSHING  10/29/2021   Procedure: BILIARY BRUSHING;   Surgeon: Irving Copas., MD;  Location: [facility];  Service: Gastroenterology;;  . BILIARY DILATION  10/29/2021   Procedure: BILIARY DILATION;  Surgeon: Irving Copas., MD;  Location: [facility];  Service: Gastroenterology;;  . BILIARY STENT PLACEMENT  09/10/2021   Procedure: BILIARY STENT PLACEMENT;  Surgeon: Markell Schrier Shipper, MD;  Location: [facility];  Service: Gastroenterology;;  . BILIARY STENT PLACEMENT N/A 10/03/2021   Procedure: BILIARY STENT PLACEMENT;  Surgeon: Gatha Mayer, MD;  Location: [facility];  Service: Gastroenterology;  Laterality: N/A;  . BILIARY STENT PLACEMENT N/A 10/29/2021   Procedure: BILIARY STENT PLACEMENT;  Surgeon: Rush Landmark Telford Nab., MD;  Location: [facility];  Service: Gastroenterology;  Laterality: N/A;  . BIOPSY  10/29/2021   Procedure: BIOPSY;  Surgeon: Rush Landmark Telford Nab., MD;  Location: [facility];  Service: Gastroenterology;;  . ENDOSCOPIC RETROGRADE CHOLANGIOPANCREATOGRAPHY (ERCP) WITH PROPOFOL N/A 10/29/2021   Procedure: ENDOSCOPIC RETROGRADE CHOLANGIOPANCREATOGRAPHY (ERCP) WITH PROPOFOL;  Surgeon: Irving Copas., MD;  Location: [facility];  Service: Gastroenterology;  Laterality: N/A;  . ERCP N/A 09/10/2021   Procedure: ENDOSCOPIC RETROGRADE CHOLANGIOPANCREATOGRAPHY (ERCP);  Surgeon: Vannia Pola Shipper, MD;  Location: [facility];  Service: Gastroenterology;  Laterality: N/A;  . ERCP N/A 10/03/2021   Procedure: ENDOSCOPIC RETROGRADE CHOLANGIOPANCREATOGRAPHY (ERCP);  Surgeon: Gatha Mayer, MD;  Location: [facility];  Service: Gastroenterology;  Laterality: N/A;  . ESOPHAGOGASTRODUODENOSCOPY (EGD) WITH PROPOFOL N/A 10/29/2021   Procedure: ESOPHAGOGASTRODUODENOSCOPY (EGD) WITH PROPOFOL;  Surgeon: Rush Landmark Telford Nab., MD;  Location: [facility];  Service: Gastroenterology;  Laterality: N/A;  .  EUS N/A 10/29/2021   Procedure: UPPER ENDOSCOPIC ULTRASOUND (EUS) RADIAL;  Surgeon: Irving Copas., MD;   Location: [facility];  Service: Gastroenterology;  Laterality: N/A;  . FINE NEEDLE ASPIRATION  10/29/2021   Procedure: FINE NEEDLE ASPIRATION (FNA) LINEAR;  Surgeon: Irving Copas., MD;  Location: [facility];  Service: Gastroenterology;;  . IR IMAGING GUIDED PORT INSERTION  01/28/2022  . PACEMAKER IMPLANT    . REMOVAL OF STONES  10/29/2021   Procedure: REMOVAL OF SLUDGE;  Surgeon: Rush Landmark Telford Nab., MD;  Location: [facility];  Service: Gastroenterology;;  . Joan Mayans  09/10/2021   Procedure: SPHINCTEROTOMY;  Surgeon: Marjan Rosman Shipper, MD;  Location: [facility];  Service: Gastroenterology;;  . Bess Kinds CHOLANGIOSCOPY N/A 10/29/2021   Procedure: RSWNIOEV CHOLANGIOSCOPY;  Surgeon: Irving Copas., MD;  Location: [facility];  Service: Gastroenterology;  Laterality: N/A;  . STENT REMOVAL  10/03/2021   Procedure: STENT REMOVAL;  Surgeon: Gatha Mayer, MD;  Location: [facility];  Service: Gastroenterology;;    SOCIAL HISTORY:  Social History   Socioeconomic History  . Marital status: Married    Spouse name: Not on file  . Number of children: Not on file  . Years of education: Not on file  . Highest education level: Not on file  Occupational History  . Not on file  Tobacco Use  . Smoking status: Never  . Smokeless tobacco: Never  Vaping Use  . Vaping Use: Never used  Substance and Sexual Activity  . Alcohol use: Never  . Drug use: Never  . Sexual activity: Not on file  Other Topics Concern  . Not on file  Social History Narrative  . Not on file   Social Determinants of Health   Financial Resource Strain: Not on file  Food Insecurity: Not on file  Transportation Needs: Not on file  Physical Activity: Not on file  Stress: Not on file  Social Connections: Not on file  Intimate Partner Violence: Not on file    FAMILY HISTORY:  Family History  Problem Relation Age of Onset  . Stomach cancer Neg Hx   . Esophageal cancer Neg Hx   . Colon  cancer Neg Hx   . Pancreatic cancer Neg Hx     CURRENT MEDICATIONS:  Current Outpatient Medications  Medication Sig Dispense Refill  . amLODipine (NORVASC) 2.5 MG tablet Take 2.5 mg by mouth at bedtime.    Marland Kitchen aspirin EC 81 MG tablet Take 81 mg by mouth daily. Swallow whole.    . Cholecalciferol (VITAMIN D3) 50 MCG (2000 UT) TABS Take 2,000 Units by mouth daily after supper.    . Coenzyme Q10 (CO Q-10) 100 MG CAPS Take 100 mg by mouth every evening.    . famotidine (PEPCID) 40 MG tablet Take 40 mg by mouth every evening.    . gabapentin (NEURONTIN) 100 MG capsule Take 100 mg by mouth See admin instructions. Take 100 mg twice daily, may take a third 100 mg dose as needed for pain    . GEMCITABINE HCL IV Inject into the vein every 14 (fourteen) days.    Marland Kitchen LORazepam (ATIVAN) 1 MG tablet Take 0.5 tablets (0.5 mg total) by mouth as needed for anxiety. Take one hour prior to MRI and again 30 min before if needed. 2 tablet 0  . meclizine (ANTIVERT) 25 MG tablet Take 25 mg by mouth daily as needed for dizziness.    . Misc Natural Products (JOINT SUPPORT) CAPS Take 1 capsule by mouth daily with  breakfast.    . Multiple Vitamin (MULTIVITAMIN) tablet Take 1 tablet by mouth daily with breakfast.    . Multiple Vitamins-Minerals (OCUVITE EYE HEALTH FORMULA) CAPS Take 1 capsule by mouth daily.    . nebivolol (BYSTOLIC) 5 MG tablet Take 2.5 mg by mouth in the morning.    Marland Kitchen omeprazole (PRILOSEC) 40 MG capsule Take 40 mg by mouth daily before breakfast.    . PACLitaxel-protein bound in Empty Containers Flexible 1 each Inject into the vein every 14 (fourteen) days.    . polyethylene glycol (MIRALAX) 17 g packet Take 17 g by mouth daily as needed. (Patient taking differently: Take 17 g by mouth daily as needed for moderate constipation.) 14 each 0  . Polyvinyl Alcohol-Povidone (REFRESH OP) Place 1 drop into both eyes daily as needed (tired eyes).    . predniSONE (DELTASONE) 20 MG tablet Take one 20 mg tablet 13  hours, then 7 hours, and then again 1 hour prior to scan 3 tablet 0  . primidone (MYSOLINE) 50 MG tablet Take 25 mg by mouth daily as needed (for tremors).    . pyridOXINE (VITAMIN B-6) 100 MG tablet Take 50 mg by mouth in the morning.    Marland Kitchen tiZANidine (ZANAFLEX) 2 MG tablet Take 1 mg by mouth daily as needed for muscle spasms.    . diphenhydrAMINE (BENADRYL ALLERGY) 25 MG tablet Take 1 tablet (25 mg total) by mouth once for 1 dose. Take 1 hour prior to scan along with Prednisone (Patient not taking: Reported on 01/23/2022) 1 tablet 0  . lidocaine-prilocaine (EMLA) cream Apply a small amount to port a cath site (do not rub in) and cover with plastic wrap 1 hour prior to infusion appointments (Patient not taking: Reported on 02/12/2022) 30 g 3  . ondansetron (ZOFRAN-ODT) 4 MG disintegrating tablet Take 4 mg by mouth every 8 (eight) hours as needed for nausea or vomiting. (Patient not taking: Reported on 02/12/2022)    . prochlorperazine (COMPAZINE) 10 MG tablet Take 1 tablet (10 mg total) by mouth every 6 (six) hours as needed for nausea or vomiting. (Patient not taking: Reported on 02/12/2022) 60 tablet 6   No current facility-administered medications for this visit.    ALLERGIES:  Allergies  Allergen Reactions  . Ivp Dye [Iodinated Contrast Media] Hives  . Lisinopril Cough    PHYSICAL EXAM:  Performance status (ECOG): 1 - Symptomatic but completely ambulatory  There were no vitals filed for this visit.  Wt Readings from Last 3 Encounters:  02/05/22 113 lb 12.8 oz (51.6 kg)  01/28/22 108 lb (49 kg)  01/22/22 112 lb 6.4 oz (51 kg)   Physical Exam Vitals reviewed.  Constitutional:      Appearance: Normal appearance.  Cardiovascular:     Rate and Rhythm: Normal rate and regular rhythm.     Pulses: Normal pulses.     Heart sounds: Normal heart sounds.  Pulmonary:     Effort: Pulmonary effort is normal.     Breath sounds: Normal breath sounds.  Neurological:     General: No focal  deficit present.     Mental Status: She is alert and oriented to person, place, and time.  Psychiatric:        Mood and Affect: Mood normal.        Behavior: Behavior normal.     LABORATORY DATA:  I have reviewed the labs as listed.     Latest Ref Rng & Units 02/12/2022    2:01 PM 02/05/2022  8:50 AM 01/08/2022   11:26 AM  CBC  WBC 4.0 - 10.5 K/uL 5.5  6.7  7.7   Hemoglobin 12.0 - 15.0 g/dL 9.6  10.6  11.2   Hematocrit 36.0 - 46.0 % 30.2  33.0  34.7   Platelets 150 - 400 K/uL 149  228  256       Latest Ref Rng & Units 02/12/2022    2:01 PM 02/05/2022    8:50 AM 01/08/2022   11:26 AM  CMP  Glucose 70 - 99 mg/dL 138  98  159   BUN 8 - 23 mg/dL 33  33  33   Creatinine 0.44 - 1.00 mg/dL 1.17  1.15  1.25   Sodium 135 - 145 mmol/L 133  138  136   Potassium 3.5 - 5.1 mmol/L 4.5  4.5  4.1   Chloride 98 - 111 mmol/L 102  108  105   CO2 22 - 32 mmol/L 25  24  24    Calcium 8.9 - 10.3 mg/dL 9.9  9.8  10.1   Total Protein 6.5 - 8.1 g/dL 6.5  6.7  7.1   Total Bilirubin 0.3 - 1.2 mg/dL 0.8  0.9  0.7   Alkaline Phos 38 - 126 U/L 307  235  134   AST 15 - 41 U/L 28  56  32   ALT 0 - 44 U/L 62  95  36     DIAGNOSTIC IMAGING:  I have independently reviewed the scans and discussed with the patient. IR IMAGING GUIDED PORT INSERTION  Result Date: 01/28/2022 INDICATION: Pancreatic cancer. In need of durable intravenous access for chemotherapy administration. EXAM: IMPLANTED PORT A CATH PLACEMENT WITH ULTRASOUND AND FLUOROSCOPIC GUIDANCE COMPARISON:  PET-CT-11/20/2021 MEDICATIONS: None ANESTHESIA/SEDATION: Moderate (conscious) sedation was employed during this procedure as administered by the Interventional Radiology RN. A total of Versed 1 mg and Fentanyl 50 mcg was administered intravenously. Moderate Sedation Time: 20 minutes. The patient's level of consciousness and vital signs were monitored continuously by radiology nursing throughout the procedure under my direct supervision. CONTRAST:  None  FLUOROSCOPY TIME:  30 seconds (2 mGy) COMPLICATIONS: None immediate. PROCEDURE: The procedure, risks, benefits, and alternatives were explained to the patient. Questions regarding the procedure were encouraged and answered. The patient understands and consents to the procedure. The right neck and chest were prepped with chlorhexidine in a sterile fashion, and a sterile drape was applied covering the operative field. Maximum barrier sterile technique with sterile gowns and gloves were used for the procedure. A timeout was performed prior to the initiation of the procedure. Local anesthesia was provided with 1% lidocaine with epinephrine. After creating a small venotomy incision, a micropuncture kit was utilized to access the internal jugular vein. Real-time ultrasound guidance was utilized for vascular access including the acquisition of a permanent ultrasound image documenting patency of the accessed vessel. The microwire was utilized to measure appropriate catheter length. A subcutaneous port pocket was then created along the upper chest wall utilizing a combination of sharp and blunt dissection. The pocket was irrigated with sterile saline. A single lumen Slim sized power injectable port was chosen for placement. The 8 Fr catheter was tunneled from the port pocket site to the venotomy incision. The port was placed in the pocket. The external catheter was trimmed to appropriate length. At the venotomy, an 8 Fr peel-away sheath was placed over a guidewire under fluoroscopic guidance. The catheter was then placed through the sheath and the sheath was removed. Final  catheter positioning was confirmed and documented with a fluoroscopic spot radiograph. The port was accessed with a Huber needle, aspirated and flushed with heparinized saline. The venotomy site was closed with an interrupted 4-0 Vicryl suture. The port pocket incision was closed with interrupted 2-0 Vicryl suture. Dermabond and Steri-strips were applied  to both incisions. Dressings were applied. The patient tolerated the procedure well without immediate post procedural complication. FINDINGS: After catheter placement, the tip lies within the superior cavoatrial junction. The catheter aspirates and flushes normally and is ready for immediate use. IMPRESSION: Successful placement of a right internal jugular approach power injectable Port-A-Cath. The catheter is ready for immediate use. Electronically Signed   By: Sandi Mariscal M.D.   On: 01/28/2022 15:39   CT ABDOMEN W WO CONTRAST  Result Date: 01/19/2022 CLINICAL DATA:  Pancreatic head cancer, staging. * Tracking Code: BO *. EXAM: CT ABDOMEN WITHOUT AND WITH CONTRAST TECHNIQUE: Multidetector CT imaging of the abdomen was performed following the standard protocol before and following the bolus administration of intravenous contrast. RADIATION DOSE REDUCTION: This exam was performed according to the departmental dose-optimization program which includes automated exposure control, adjustment of the mA and/or kV according to patient size and/or use of iterative reconstruction technique. CONTRAST:  63m OMNIPAQUE IOHEXOL 300 MG/ML  SOLN COMPARISON:  Multiple priors including CT chest abdomen and pelvis dated November 10, 2021, nuclear medicine PET scan November 20, 2021 and MRI abdomen December 24, 2021. FINDINGS: Lower chest: Bibasilar atelectasis/scarring. Hepatobiliary: No suspicious hepatic lesion. Gallbladder is nondistended. Similar pericholecystic fluid around the gallbladder fundus with a new enhancing focus of nodular soft tissue along the posterior aspect of the gallbladder wall measuring 9 mm on image 80/11. Metallic stent in the common bile duct with pneumobilia consistent with stent patency. No biliary ductal dilation. Pancreas: Hypoenhancing mass in the pancreatic head measures 2.9 x 1.8 cm on image 41/4. Mild prominence of the pancreatic duct without significant distal gland atrophy. The mass abuts the SMA without  evidence of encasement and best seen on axial image 38/4. Additionally there is slightly greater than 180 degrees of abutment of the portal vein by the mass with some focal effacement of the vein but without definite CT evidence of encasement and best seen on sagittal image 50/8. Spleen: No splenomegaly or focal splenic lesion. Adrenals/Urinary Tract: Bilateral adrenal glands appear normal. Bilateral renal cysts are considered benign and require no independent imaging follow-up. Bilateral cortical renal scarring. Stomach/Bowel: No radiopaque enteric contrast material was administered. Stomach is minimally distended limiting evaluation. No pathologic dilation or evidence of acute inflammation involving loops of large or small bowel in the abdomen. Vascular/Lymphatic: Aortic atherosclerosis. Prominent retroperitoneal lymph nodes measuring up to 6 mm on image 62/11 are similar prior. Other: Mild mesenteric and omental stranding. Musculoskeletal: Multilevel degenerative changes spine. No aggressive lytic or blastic lesion of bone. IMPRESSION: 1. Hypoenhancing mass in the pancreatic head measuring 2.9 x 1.8 cm is consistent with the patient's known primary pancreatic neoplasm. 2. The pancreatic neoplasm demonstrates slightly greater than 180 degrees of abutment of the portal vein as well as abutment of the SMA but without definite vascular encasement. 3. Similar trace pericholecystic fluid along the gallbladder fundus with a new enhancing 9 mm soft tissue nodule along the posterior aspect of the gallbladder wall, suspicious for a peritoneal implant. 4. Prominent retroperitoneal lymph nodes measuring up to 6 mm are similar prior and while not hypermetabolic on prior PET-CT are below the resolution of that examination, attention on follow-up  imaging suggested. 5. Metallic stent in the common bile duct with pneumobilia consistent with stent patency. 6.  Aortic Atherosclerosis (ICD10-I70.0). Electronically Signed   By:  Dahlia Bailiff M.D.   On: 01/19/2022 16:54     ASSESSMENT:  T2 N0 M1 pancreatic adenocarcinoma: - She presented with painless jaundice in April 2023.  Weight loss of 13 pounds since April. - Stent placement on 09/10/2021 for a bilirubin of 8.2. - 10/03/2021: ERCP and stent removal and stent placement for stent dysfunction with total bilirubin 16.3. - 10/29/2021: ERCP/EUS: Lower CBD stent stenosis from stricture, status post metal stent placement.  EUS showed mass in the superior pancreatic head measuring 2.8 x 2.2 cm, suggesting abutment of the portal vein.  Intact interface was seen between lesion and SMA and celiac trunk suggesting lack of invasion.  Hypoechoic 14 x 12 mm lesion visualized in the liver.  Biopsy could not be attempted due to presence of blood vessel. - She had syncopal episodes in July 11, 2022, had pacemaker placement for bradycardia. - Pathology: Pancreatic head FNA and biliary stricture brushing: Malignant cells consistent with adenocarcinoma. - CA 19-9: 131 - We reviewed MRI of the abdomen with and without contrast (12/24/2021): Pancreatic head mass measures 2.7 x 2.3 cm (2.5 x 1.8 cm previously).  Mass is abutting and slightly displacing the SMA.  No obvious fat plane is demonstrated.  Portal and splenic veins are patent.  Stable small scattered hepatic cysts with no liver metastatic disease. - PET scan (11/20/2021): Ill-defined low-level hypermetabolic activity about the metallic CBD stent and pancreatic uncinate process without discrete CT correlate.  No convincing evidence of hypermetabolic disease in the neck, chest, abdomen or pelvis.  Tiny bilateral lung nodules measuring up to 3 mm.   Social/family history: - She lives at home with her husband who has dementia.  She worked as a Char[employer] fundraiser for 33 years.  Non-smoker and no exposure to chemicals.  She has not been driving since 07-11-2022. - Daughter died of glioblastoma.  Mother had ovarian cancer.  Maternal aunt had breast  cancer.   PLAN:  T2 N0 M0 pancreatic adenocarcinoma: -CT pancreatic protocol (01/16/2022): Hypoenhancing mass in the pancreatic head measuring 2.9 x 1.8 cm.  Slightly greater than 180 degrees of abutment with portal vein as well as SMA without definite vascular encasement.  New enhancing 9 mm soft tissue nodule along the posterior aspect of the gallbladder wall?  Suspicious for peritoneal implant. -I have discussed with Dr. Zenia Resides who has seen this patient. -Dr. Zenia Resides thought that this patient might not be a candidate for surgery but she would like to evaluate after 3 to 4 months of neoadjuvant chemotherapy. -We talked about neoadjuvant chemotherapy.  She is not a candidate for FOLFIRINOX.  I have recommended gemcitabine and Abraxane every other week -Recommend port placement. -We discussed side effects in detail.  We have given literature to the patient.  We will dose reduce during first cycle to see how she tolerates.  2.  Weight loss: -She lost about 13 pounds since April of this year.  However she is gaining back her weight.    Orders placed this encounter:  No orders of the defined types were placed in this encounter.     Derek Jack, MD [place] [phone]

## 2022-02-13 ENCOUNTER — Encounter: Payer: Self-pay | Admitting: Hematology

## 2022-02-19 ENCOUNTER — Other Ambulatory Visit: Payer: Medicare HMO

## 2022-02-19 ENCOUNTER — Inpatient Hospital Stay: Payer: Medicare HMO

## 2022-02-19 ENCOUNTER — Inpatient Hospital Stay (HOSPITAL_BASED_OUTPATIENT_CLINIC_OR_DEPARTMENT_OTHER): Payer: Medicare HMO | Admitting: Physician Assistant

## 2022-02-19 VITALS — BP 146/70 | HR 77 | Temp 98.0°F | Resp 18 | Wt 113.0 lb

## 2022-02-19 DIAGNOSIS — Z5111 Encounter for antineoplastic chemotherapy: Secondary | ICD-10-CM | POA: Diagnosis not present

## 2022-02-19 DIAGNOSIS — C259 Malignant neoplasm of pancreas, unspecified: Secondary | ICD-10-CM

## 2022-02-19 DIAGNOSIS — Z95828 Presence of other vascular implants and grafts: Secondary | ICD-10-CM

## 2022-02-19 LAB — COMPREHENSIVE METABOLIC PANEL
ALT: 40 U/L (ref 0–44)
AST: 29 U/L (ref 15–41)
Albumin: 3.2 g/dL — ABNORMAL LOW (ref 3.5–5.0)
Alkaline Phosphatase: 236 U/L — ABNORMAL HIGH (ref 38–126)
Anion gap: 5 (ref 5–15)
BUN: 27 mg/dL — ABNORMAL HIGH (ref 8–23)
CO2: 24 mmol/L (ref 22–32)
Calcium: 9.7 mg/dL (ref 8.9–10.3)
Chloride: 105 mmol/L (ref 98–111)
Creatinine, Ser: 1.26 mg/dL — ABNORMAL HIGH (ref 0.44–1.00)
GFR, Estimated: 42 mL/min — ABNORMAL LOW (ref >60)
Glucose, Bld: 123 mg/dL — ABNORMAL HIGH (ref 70–99)
Potassium: 4.5 mmol/L (ref 3.5–5.1)
Sodium: 134 mmol/L — ABNORMAL LOW (ref 135–145)
Total Bilirubin: 0.5 mg/dL (ref 0.3–1.2)
Total Protein: 6.3 g/dL — ABNORMAL LOW (ref 6.5–8.1)

## 2022-02-19 LAB — CBC WITH DIFFERENTIAL/PLATELET
Abs Immature Granulocytes: 0.01 10*3/uL (ref 0.00–0.07)
Basophils Absolute: 0 10*3/uL (ref 0.0–0.1)
Basophils Relative: 1 %
Eosinophils Absolute: 0.1 10*3/uL (ref 0.0–0.5)
Eosinophils Relative: 2 %
HCT: 30.3 % — ABNORMAL LOW (ref 36.0–46.0)
Hemoglobin: 9.6 g/dL — ABNORMAL LOW (ref 12.0–15.0)
Immature Granulocytes: 0 %
Lymphocytes Relative: 21 %
Lymphs Abs: 1.3 10*3/uL (ref 0.7–4.0)
MCH: 29.5 pg (ref 26.0–34.0)
MCHC: 31.7 g/dL (ref 30.0–36.0)
MCV: 93.2 fL (ref 80.0–100.0)
Monocytes Absolute: 0.8 10*3/uL (ref 0.1–1.0)
Monocytes Relative: 13 %
Neutro Abs: 3.8 10*3/uL (ref 1.7–7.7)
Neutrophils Relative %: 63 %
Platelets: 345 10*3/uL (ref 150–400)
RBC: 3.25 MIL/uL — ABNORMAL LOW (ref 3.87–5.11)
RDW: 14.9 % (ref 11.5–15.5)
WBC: 6 10*3/uL (ref 4.0–10.5)
nRBC: 0 % (ref 0.0–0.2)

## 2022-02-19 LAB — MAGNESIUM: Magnesium: 2.2 mg/dL (ref 1.7–2.4)

## 2022-02-19 MED ORDER — PALONOSETRON HCL INJECTION 0.25 MG/5ML
0.2500 mg | Freq: Once | INTRAVENOUS | Status: AC
  Administered 2022-02-19: 0.25 mg via INTRAVENOUS
  Filled 2022-02-19: qty 5

## 2022-02-19 MED ORDER — SODIUM CHLORIDE 0.9 % IV SOLN
500.0000 mg/m2 | Freq: Once | INTRAVENOUS | Status: AC
  Administered 2022-02-19: 760 mg via INTRAVENOUS
  Filled 2022-02-19: qty 19.99

## 2022-02-19 MED ORDER — HEPARIN SOD (PORK) LOCK FLUSH 100 UNIT/ML IV SOLN
500.0000 [IU] | Freq: Once | INTRAVENOUS | Status: AC | PRN
  Administered 2022-02-19: 500 [IU]

## 2022-02-19 MED ORDER — SODIUM CHLORIDE 0.9 % IV SOLN: Freq: Once | INTRAVENOUS | Status: AC

## 2022-02-19 MED ORDER — SODIUM CHLORIDE 0.9 % IV SOLN
10.0000 mg | Freq: Once | INTRAVENOUS | Status: AC
  Administered 2022-02-19: 10 mg via INTRAVENOUS
  Filled 2022-02-19: qty 10

## 2022-02-19 MED ORDER — SODIUM CHLORIDE 0.9% FLUSH
10.0000 mL | INTRAVENOUS | Status: DC | PRN
  Administered 2022-02-19: 10 mL

## 2022-02-19 MED ORDER — PACLITAXEL PROTEIN-BOUND CHEMO INJECTION 100 MG
100.0000 mg/m2 | Freq: Once | INTRAVENOUS | Status: AC
  Administered 2022-02-19: 150 mg via INTRAVENOUS
  Filled 2022-02-19: qty 30

## 2022-02-19 NOTE — Progress Notes (Signed)
[place],  [address]   CLINIC:  Medical Oncology/Hematology  PCP:  Abigail Miyamoto, [place] / [place] [address] [phone]   REASON FOR VISIT:  Follow-up for pancreatic adenocarcinoma  PRIOR THERAPY: none  NGS Results: not done  CURRENT THERAPY: Gemcitabine and Abraxane q 2 weeks, started on 02/05/2022  BRIEF ONCOLOGIC HISTORY:  Oncology History  Pancreatic carcinoma ([place])  11/11/2021 Initial Diagnosis   Pancreatic carcinoma ([place])   02/05/2022 -  Chemotherapy   Patient is on Treatment Plan : PANCREATIC Abraxane D1,8,15 + Gemcitabine D1,8,15 q28d       CANCER STAGING:  Cancer Staging  Pancreatic carcinoma ([place]) Staging form: Exocrine Pancreas, AJCC 8th Edition - Clinical stage from 11/11/2021: Stage IB (cT2, cN0, cM0) - Unsigned   INTERVAL HISTORY:  Ms. Levia Waltermire, a 87 y.o. female, seen for follow-up of pancreatic cancer before cycle 1, day 15 of gemcitabine and Abraxane.  Ms. Fahs reports that her energy levels are fairly stable.  She denies any appetite changes or weight loss.  She experience some dizziness last week after our visit and she napped for a period of time later that day.  Her dizziness has resolved now and she denies any new symptoms.  Has nausea, vomiting or abdominal pain.  She reports intermittent episodes of constipation and takes a suppository periodically.She denies easy bruising or signs of active bleeding.  She denies fevers, chills, night sweats, shortness of breath, chest pain or cough.  She has no other complaints.  REVIEW OF SYSTEMS:  Review of Systems  Constitutional:  Positive for fatigue. Negative for appetite change and unexpected weight change.  Respiratory:  Negative for shortness of breath.   Cardiovascular:  Negative for chest pain.  Gastrointestinal:  Positive for constipation. Negative for abdominal pain, diarrhea, nausea and vomiting.  Neurological:   Positive for dizziness. Negative for numbness.  All other systems reviewed and are negative.   PAST MEDICAL/SURGICAL HISTORY:  Past Medical History:  Diagnosis Date   Essential tremor    GERD (gastroesophageal reflux disease)    High cholesterol    Hypertension    PONV (postoperative nausea and vomiting)    Port-A-Cath in place 01/27/2022   Vertigo    Past Surgical History:  Procedure Laterality Date   BILIARY BRUSHING  09/10/2021   Procedure: BILIARY BRUSHING;  Surgeon: Artavius Stearns Shipper, MD;  Location: [facility] ENDOSCOPY;  Service: Gastroenterology;;   BILIARY BRUSHING  10/29/2021   Procedure: BILIARY BRUSHING;  Surgeon: Irving Copas., MD;  Location: [facility] ENDOSCOPY;  Service: Gastroenterology;;   BILIARY DILATION  10/29/2021   Procedure: BILIARY DILATION;  Surgeon: Irving Copas., MD;  Location: [facility] ENDOSCOPY;  Service: Gastroenterology;;   BILIARY STENT PLACEMENT  09/10/2021   Procedure: BILIARY STENT PLACEMENT;  Surgeon: Tamaira Ciriello Shipper, MD;  Location: [facility] ENDOSCOPY;  Service: Gastroenterology;;   BILIARY STENT PLACEMENT N/A 10/03/2021   Procedure: BILIARY STENT PLACEMENT;  Surgeon: Gatha Mayer, MD;  Location: [facility] ENDOSCOPY;  Service: Gastroenterology;  Laterality: N/A;   BILIARY STENT PLACEMENT N/A 10/29/2021   Procedure: BILIARY STENT PLACEMENT;  Surgeon: Rush Landmark Telford Nab., MD;  Location: [facility] ENDOSCOPY;  Service: Gastroenterology;  Laterality: N/A;   BIOPSY  10/29/2021   Procedure: BIOPSY;  Surgeon: Rush Landmark Telford Nab., MD;  Location: [facility] ENDOSCOPY;  Service: Gastroenterology;;   ENDOSCOPIC RETROGRADE CHOLANGIOPANCREATOGRAPHY (ERCP) WITH PROPOFOL N/A 10/29/2021   Procedure: ENDOSCOPIC RETROGRADE CHOLANGIOPANCREATOGRAPHY (ERCP) WITH PROPOFOL;  Surgeon: Irving Copas., MD;  Location:  [facility] ENDOSCOPY;  Service: Gastroenterology;  Laterality: N/A;   ERCP N/A 09/10/2021   Procedure: ENDOSCOPIC RETROGRADE CHOLANGIOPANCREATOGRAPHY (ERCP);  Surgeon: Karmelo Bass Shipper, MD;   Location: [facility];  Service: Gastroenterology;  Laterality: N/A;   ERCP N/A 10/03/2021   Procedure: ENDOSCOPIC RETROGRADE CHOLANGIOPANCREATOGRAPHY (ERCP);  Surgeon: Gatha Mayer, MD;  Location: [facility] ENDOSCOPY;  Service: Gastroenterology;  Laterality: N/A;   ESOPHAGOGASTRODUODENOSCOPY (EGD) WITH PROPOFOL N/A 10/29/2021   Procedure: ESOPHAGOGASTRODUODENOSCOPY (EGD) WITH PROPOFOL;  Surgeon: Rush Landmark Telford Nab., MD;  Location: [facility] ENDOSCOPY;  Service: Gastroenterology;  Laterality: N/A;   EUS N/A 10/29/2021   Procedure: UPPER ENDOSCOPIC ULTRASOUND (EUS) RADIAL;  Surgeon: Irving Copas., MD;  Location: [facility] ENDOSCOPY;  Service: Gastroenterology;  Laterality: N/A;   FINE NEEDLE ASPIRATION  10/29/2021   Procedure: FINE NEEDLE ASPIRATION (FNA) LINEAR;  Surgeon: Irving Copas., MD;  Location: [facility] ENDOSCOPY;  Service: Gastroenterology;;   IR IMAGING GUIDED PORT INSERTION  01/28/2022   PACEMAKER IMPLANT     REMOVAL OF STONES  10/29/2021   Procedure: REMOVAL OF SLUDGE;  Surgeon: Irving Copas., MD;  Location: [facility] ENDOSCOPY;  Service: Gastroenterology;;   Joan Mayans  09/10/2021   Procedure: Joan Mayans;  Surgeon: Quandarius Nill Shipper, MD;  Location: [facility] ENDOSCOPY;  Service: Gastroenterology;;   Bess Kinds CHOLANGIOSCOPY N/A 10/29/2021   Procedure: EHUDJSHF CHOLANGIOSCOPY;  Surgeon: Irving Copas., MD;  Location: [facility] ENDOSCOPY;  Service: Gastroenterology;  Laterality: N/A;   STENT REMOVAL  10/03/2021   Procedure: STENT REMOVAL;  Surgeon: Gatha Mayer, MD;  Location: [facility] ENDOSCOPY;  Service: Gastroenterology;;    SOCIAL HISTORY:  Social History   Socioeconomic History   Marital status: Married    Spouse name: Not on file   Number of children: Not on file   Years of education: Not on file   Highest education level: Not on file  Occupational History   Not on file  Tobacco Use   Smoking status: Never   Smokeless tobacco: Never  Vaping Use   Vaping Use: Never used   Substance and Sexual Activity   Alcohol use: Never   Drug use: Never   Sexual activity: Not on file  Other Topics Concern   Not on file  Social History Narrative   Not on file   Social Determinants of Health   Financial Resource Strain: Not on file  Food Insecurity: Not on file  Transportation Needs: Not on file  Physical Activity: Not on file  Stress: Not on file  Social Connections: Not on file  Intimate Partner Violence: Not on file    FAMILY HISTORY:  Family History  Problem Relation Age of Onset   Stomach cancer Neg Hx    Esophageal cancer Neg Hx    Colon cancer Neg Hx    Pancreatic cancer Neg Hx     CURRENT MEDICATIONS:  Current Outpatient Medications  Medication Sig Dispense Refill   amLODipine (NORVASC) 2.5 MG tablet Take 2.5 mg by mouth at bedtime.     aspirin EC 81 MG tablet Take 81 mg by mouth daily. Swallow whole.     Cholecalciferol (VITAMIN D3) 50 MCG (2000 UT) TABS Take 2,000 Units by mouth daily after supper.     Coenzyme Q10 (CO Q-10) 100 MG CAPS Take 100 mg by mouth every evening.     famotidine (PEPCID) 40 MG tablet Take 40 mg by mouth every evening.     gabapentin (NEURONTIN) 100 MG capsule Take 100 mg by mouth See admin instructions. Take 100 mg twice daily,  may take a third 100 mg dose as needed for pain     GEMCITABINE HCL IV Inject into the vein every 14 (fourteen) days.     lidocaine-prilocaine (EMLA) cream Apply a small amount to port a cath site (do not rub in) and cover with plastic wrap 1 hour prior to infusion appointments 30 g 3   meclizine (ANTIVERT) 25 MG tablet Take 25 mg by mouth daily as needed for dizziness.     Misc Natural Products (JOINT SUPPORT) CAPS Take 1 capsule by mouth daily with breakfast.     Multiple Vitamin (MULTIVITAMIN) tablet Take 1 tablet by mouth daily with breakfast.     Multiple Vitamins-Minerals (OCUVITE EYE HEALTH FORMULA) CAPS Take 1 capsule by mouth daily.     nebivolol (BYSTOLIC) 5 MG tablet Take 2.5 mg by  mouth in the morning.     omeprazole (PRILOSEC) 40 MG capsule Take 40 mg by mouth daily before breakfast.     ondansetron (ZOFRAN-ODT) 4 MG disintegrating tablet Take 4 mg by mouth every 8 (eight) hours as needed for nausea or vomiting.     PACLitaxel-protein bound in Empty Containers Flexible 1 each Inject into the vein every 14 (fourteen) days.     polyethylene glycol (MIRALAX) 17 g packet Take 17 g by mouth daily as needed. (Patient taking differently: Take 17 g by mouth daily as needed for moderate constipation.) 14 each 0   Polyvinyl Alcohol-Povidone (REFRESH OP) Place 1 drop into both eyes daily as needed (tired eyes).     primidone (MYSOLINE) 50 MG tablet Take 25 mg by mouth daily as needed (for tremors).     prochlorperazine (COMPAZINE) 10 MG tablet Take 1 tablet (10 mg total) by mouth every 6 (six) hours as needed for nausea or vomiting. 60 tablet 6   pyridOXINE (VITAMIN B-6) 100 MG tablet Take 50 mg by mouth in the morning.     tiZANidine (ZANAFLEX) 2 MG tablet Take 1 mg by mouth daily as needed for muscle spasms.     No current facility-administered medications for this visit.    ALLERGIES:  Allergies  Allergen Reactions   Ivp Dye [Iodinated Contrast Media] Hives   Lisinopril Cough    PHYSICAL EXAM:  Performance status (ECOG): 1 - Symptomatic but completely ambulatory  There were no vitals filed for this visit.  Wt Readings from Last 3 Encounters:  02/05/22 113 lb 12.8 oz (51.6 kg)  01/28/22 108 lb (49 kg)  01/22/22 112 lb 6.4 oz (51 kg)   Physical Exam Vitals reviewed.  Constitutional:      Appearance: Normal appearance.  Cardiovascular:     Rate and Rhythm: Normal rate and regular rhythm.     Pulses: Normal pulses.     Heart sounds: Normal heart sounds.  Pulmonary:     Effort: Pulmonary effort is normal.     Breath sounds: Normal breath sounds.  Neurological:     General: No focal deficit present.     Mental Status: She is alert and oriented to person, place,  and time.  Psychiatric:        Mood and Affect: Mood normal.        Behavior: Behavior normal.      LABORATORY DATA:  I have reviewed the labs as listed.     Latest Ref Rng & Units 02/19/2022    9:22 AM 02/12/2022    2:01 PM 02/05/2022    8:50 AM  CBC  WBC 4.0 - 10.5 K/uL 6.0  5.5  6.7   Hemoglobin 12.0 - 15.0 g/dL 9.6  9.6  10.6   Hematocrit 36.0 - 46.0 % 30.3  30.2  33.0   Platelets 150 - 400 K/uL 345  149  228       Latest Ref Rng & Units 02/19/2022    9:22 AM 02/12/2022    2:01 PM 02/05/2022    8:50 AM  CMP  Glucose 70 - 99 mg/dL 123  138  98   BUN 8 - 23 mg/dL 27  33  33   Creatinine 0.44 - 1.00 mg/dL 1.26  1.17  1.15   Sodium 135 - 145 mmol/L 134  133  138   Potassium 3.5 - 5.1 mmol/L 4.5  4.5  4.5   Chloride 98 - 111 mmol/L 105  102  108   CO2 22 - 32 mmol/L 24  25  24    Calcium 8.9 - 10.3 mg/dL 9.7  9.9  9.8   Total Protein 6.5 - 8.1 g/dL 6.3  6.5  6.7   Total Bilirubin 0.3 - 1.2 mg/dL 0.5  0.8  0.9   Alkaline Phos 38 - 126 U/L 236  307  235   AST 15 - 41 U/L 29  28  56   ALT 0 - 44 U/L 40  62  95     DIAGNOSTIC IMAGING:  I have independently reviewed the scans and discussed with the patient. IR IMAGING GUIDED PORT INSERTION  Result Date: 01/28/2022 INDICATION: Pancreatic cancer. In need of durable intravenous access for chemotherapy administration. EXAM: IMPLANTED PORT A CATH PLACEMENT WITH ULTRASOUND AND FLUOROSCOPIC GUIDANCE COMPARISON:  PET-CT-11/20/2021 MEDICATIONS: None ANESTHESIA/SEDATION: Moderate (conscious) sedation was employed during this procedure as administered by the Interventional Radiology RN. A total of Versed 1 mg and Fentanyl 50 mcg was administered intravenously. Moderate Sedation Time: 20 minutes. The patient's level of consciousness and vital signs were monitored continuously by radiology nursing throughout the procedure under my direct supervision. CONTRAST:  None FLUOROSCOPY TIME:  30 seconds (2 mGy) COMPLICATIONS: None immediate. PROCEDURE: The  procedure, risks, benefits, and alternatives were explained to the patient. Questions regarding the procedure were encouraged and answered. The patient understands and consents to the procedure. The right neck and chest were prepped with chlorhexidine in a sterile fashion, and a sterile drape was applied covering the operative field. Maximum barrier sterile technique with sterile gowns and gloves were used for the procedure. A timeout was performed prior to the initiation of the procedure. Local anesthesia was provided with 1% lidocaine with epinephrine. After creating a small venotomy incision, a micropuncture kit was utilized to access the internal jugular vein. Real-time ultrasound guidance was utilized for vascular access including the acquisition of a permanent ultrasound image documenting patency of the accessed vessel. The microwire was utilized to measure appropriate catheter length. A subcutaneous port pocket was then created along the upper chest wall utilizing a combination of sharp and blunt dissection. The pocket was irrigated with sterile saline. A single lumen Slim sized power injectable port was chosen for placement. The 8 Fr catheter was tunneled from the port pocket site to the venotomy incision. The port was placed in the pocket. The external catheter was trimmed to appropriate length. At the venotomy, an 8 Fr peel-away sheath was placed over a guidewire under fluoroscopic guidance. The catheter was then placed through the sheath and the sheath was removed. Final catheter positioning was confirmed and documented with a fluoroscopic spot radiograph. The port was accessed with  a Huber needle, aspirated and flushed with heparinized saline. The venotomy site was closed with an interrupted 4-0 Vicryl suture. The port pocket incision was closed with interrupted 2-0 Vicryl suture. Dermabond and Steri-strips were applied to both incisions. Dressings were applied. The patient tolerated the procedure well  without immediate post procedural complication. FINDINGS: After catheter placement, the tip lies within the superior cavoatrial junction. The catheter aspirates and flushes normally and is ready for immediate use. IMPRESSION: Successful placement of a right internal jugular approach power injectable Port-A-Cath. The catheter is ready for immediate use. Electronically Signed   By: Sandi Mariscal M.D.   On: 01/28/2022 15:39     ASSESSMENT:  T2 N0 M1 pancreatic adenocarcinoma: - She presented with painless jaundice in April 2023.  Weight loss of 13 pounds since April. - Stent placement on 09/10/2021 for a bilirubin of 8.2. - 10/03/2021: ERCP and stent removal and stent placement for stent dysfunction with total bilirubin 16.3. - 10/29/2021: ERCP/EUS: Lower CBD stent stenosis from stricture, status post metal stent placement.  EUS showed mass in the superior pancreatic head measuring 2.8 x 2.2 cm, suggesting abutment of the portal vein.  Intact interface was seen between lesion and SMA and celiac trunk suggesting lack of invasion.  Hypoechoic 14 x 12 mm lesion visualized in the liver.  Biopsy could not be attempted due to presence of blood vessel. - She had syncopal episodes in 2022-07-11, had pacemaker placement for bradycardia. - Pathology: Pancreatic head FNA and biliary stricture brushing: Malignant cells consistent with adenocarcinoma. - CA 19-9: 131 - We reviewed MRI of the abdomen with and without contrast (12/24/2021): Pancreatic head mass measures 2.7 x 2.3 cm (2.5 x 1.8 cm previously).  Mass is abutting and slightly displacing the SMA.  No obvious fat plane is demonstrated.  Portal and splenic veins are patent.  Stable small scattered hepatic cysts with no liver metastatic disease. - PET scan (11/20/2021): Ill-defined low-level hypermetabolic activity about the metallic CBD stent and pancreatic uncinate process without discrete CT correlate.  No convincing evidence of hypermetabolic disease in the neck, chest,  abdomen or pelvis.  Tiny bilateral lung nodules measuring up to 3 mm.       -- Started gemcitabine 500 mg/m2 and abrxane 100 mg/m2 q 2 weeks on 02/05/2022    Social/family history: - She lives at home with her husband who has dementia.  She worked as a [employer] fundraiser for 33 years.  Non-smoker and no exposure to chemicals.  She has not been driving since 2022-07-11. - Daughter died of glioblastoma.  Mother had ovarian cancer.  Maternal aunt had breast cancer.   PLAN:  T2 N0 M0 pancreatic adenocarcinoma: -Presents today for a toxicity evaluation before cycle 1, day 15 of gemcitabine and cisplatin.  -- Labs from today were reviewed that stable anemia with hemoglobin of 9.6. No evidence of neutropenia or thrombocytopenia. Creatinine is stable at 1.26 -- Patient has no prohibitive toxicities for chemotherapy and can proceed with treatment today without any dose modifications.  --RTC next week for labs and follow up visit before Cycle 1, Day 15.   2.  Weight loss: -She lost about 13 pounds since April of this year. She is slowly gaining weight back.  3. Constipation: -- Encouraged her to take stool softeners/laxatives such as Senokot or miralax consistently rather than taking a suppository.  4. Dizziness:  --Intermittent episodes, asymptomatic today. Advised to check BP with next episode and follow up with clinic. Encouraged good intake of PO  fluids.    Orders placed this encounter:  No orders of the defined types were placed in this encounter.  Patient expressed understanding and satisfaction with the plan provided.  I have spent a total of 30 minutes minutes of face-to-face and non-face-to-face time, preparing to see the [place] a medically appropriate examination, counseling and educating the patient, documenting clinical information in the electronic health record, and care coordination.   Dede Query PA-C Dept of Hematology and [place] at [facility] Phone: [phone]

## 2022-02-19 NOTE — Patient Instructions (Signed)
[facility]  Discharge Instructions: Thank you for choosing [facility] to provide your oncology and hematology care.  If you have a lab appointment with the [facility], please come in thru the Main Entrance and check in at the main information desk.  Wear comfortable clothing and clothing appropriate for easy access to any Portacath or PICC line.   We strive to give you quality time with your provider. You may need to reschedule your appointment if you arrive late (15 or more minutes).  Arriving late affects you and other patients whose appointments are after yours.  Also, if you miss three or more appointments without notifying the office, you may be dismissed from the clinic at the provider's discretion.      For prescription refill requests, have your pharmacy contact our office and allow 72 hours for refills to be completed.    Today you received the following chemotherapy and/or immunotherapy agents Abraxane/Gemzar.  Gemcitabine Injection What is this medication? GEMCITABINE (jem SYE ta been) treats some types of cancer. It works by slowing down the growth of cancer cells. This medicine may be used for other purposes; ask your health care provider or pharmacist if you have questions. COMMON BRAND NAME(S): Gemzar, Infugem What should I tell my care team before I take this medication? They need to know if you have any of these conditions: Blood disorders Infection Kidney disease Liver disease Lung or breathing disease, such as asthma or COPD Recent or ongoing radiation therapy An unusual or allergic reaction to gemcitabine, other medications, foods, dyes, or preservatives If you or your partner are pregnant or trying to get pregnant Breast-feeding How should I use this medication? This medication is injected into a vein. It is given by your care team in a hospital or clinic setting. Talk to your care team about the use of this medication in  children. Special care may be needed. Overdosage: If you think you have taken too much of this medicine contact a poison control center or emergency room at once. NOTE: This medicine is only for you. Do not share this medicine with others. What if I miss a dose? Keep appointments for follow-up doses. It is important not to miss your dose. Call your care team if you are unable to keep an appointment. What may interact with this medication? Interactions have not been studied. This list may not describe all possible interactions. Give your health care provider a list of all the medicines, herbs, non-prescription drugs, or dietary supplements you use. Also tell them if you smoke, drink alcohol, or use illegal drugs. Some items may interact with your medicine. What should I watch for while using this medication? Your condition will be monitored carefully while you are receiving this medication. This medication may make you feel generally unwell. This is not uncommon, as chemotherapy can affect healthy cells as well as cancer cells. Report any side effects. Continue your course of treatment even though you feel ill unless your care team tells you to stop. In some cases, you may be given additional medications to help with side effects. Follow all directions for their use. This medication may increase your risk of getting an infection. Call your care team for advice if you get a fever, chills, sore throat, or other symptoms of a cold or flu. Do not treat yourself. Try to avoid being around people who are sick. This medication may increase your risk to bruise or bleed. Call your care  team if you notice any unusual bleeding. Be careful brushing or flossing your teeth or using a toothpick because you may get an infection or bleed more easily. If you have any dental work done, tell your dentist you are receiving this medication. Avoid taking medications that contain aspirin, acetaminophen, ibuprofen, naproxen,  or ketoprofen unless instructed by your care team. These medications may hide a fever. Talk to your care team if you or your partner wish to become pregnant or think you might be pregnant. This medication can cause serious birth defects if taken during pregnancy and for 6 months after the last dose. A negative pregnancy test is required before starting this medication. A reliable form of contraception is recommended while taking this medication and for 6 months after the last dose. Talk to your care team about effective forms of contraception. Do not father a child while taking this medication and for 3 months after the last dose. Use a condom while having sex during this time period. Do not breastfeed while taking this medication and for at least 1 week after the last dose. This medication may cause infertility. Talk to your care team if you are concerned about your fertility. What side effects may I notice from receiving this medication? Side effects that you should report to your care team as soon as possible: Allergic reactions--skin rash, itching, hives, swelling of the face, lips, tongue, or throat Capillary leak syndrome--stomach or muscle pain, unusual weakness or fatigue, feeling faint or lightheaded, decrease in the amount of urine, swelling of the ankles, hands, or feet, trouble breathing Infection--fever, chills, cough, sore throat, wounds that don't heal, pain or trouble when passing urine, general feeling of discomfort or being unwell Liver injury--right upper belly pain, loss of appetite, nausea, light-colored stool, dark yellow or Jonmarc Bodkin urine, yellowing skin or eyes, unusual weakness or fatigue Low red blood cell level--unusual weakness or fatigue, dizziness, headache, trouble breathing Lung injury--shortness of breath or trouble breathing, cough, spitting up blood, chest pain, fever Stomach pain, bloody diarrhea, pale skin, unusual weakness or fatigue, decrease in the amount of urine,  which may be signs of hemolytic uremic syndrome Sudden and severe headache, confusion, change in vision, seizures, which may be signs of posterior reversible encephalopathy syndrome (PRES) Unusual bruising or bleeding Side effects that usually do not require medical attention (report to your care team if they continue or are bothersome): Diarrhea Drowsiness Hair loss Nausea Pain, redness, or swelling with sores inside the mouth or throat Vomiting This list may not describe all possible side effects. Call your doctor for medical advice about side effects. You may report side effects to FDA at 1-800-FDA-1088. Where should I keep my medication? This medication is given in a hospital or clinic. It will not be stored at home. NOTE: This sheet is a summary. It may not cover all possible information. If you have questions about this medicine, talk to your doctor, pharmacist, or health care provider.  2023 Elsevier/Gold Standard (2021-09-17 00:00:00)   Paclitaxel Nanoparticle Albumin-Bound Injection What is this medication? NANOPARTICLE ALBUMIN-BOUND PACLITAXEL (Na no PAHR ti kuhl al BYOO muhn-bound PAK li TAX el) treats some types of cancer. It works by slowing down the growth of cancer cells. This medicine may be used for other purposes; ask your health care provider or pharmacist if you have questions. COMMON BRAND NAME(S): Abraxane What should I tell my care team before I take this medication? They need to know if you have any of these conditions: Liver  disease Low white blood cell levels An unusual or allergic reaction to paclitaxel, albumin, other medications, foods, dyes, or preservatives If you or your partner are pregnant or trying to get pregnant Breast-feeding How should I use this medication? This medication is injected into a vein. It is given by your care team in a hospital or clinic setting. Talk to your care team about the use of this medication in children. Special care may  be needed. Overdosage: If you think you have taken too much of this medicine contact a poison control center or emergency room at once. NOTE: This medicine is only for you. Do not share this medicine with others. What if I miss a dose? Keep appointments for follow-up doses. It is important not to miss your dose. Call your care team if you are unable to keep an appointment. What may interact with this medication? Other medications may affect the way this medication works. Talk with your care team about all of the medications you take. They may suggest changes to your treatment plan to lower the risk of side effects and to make sure your medications work as intended. This list may not describe all possible interactions. Give your health care provider a list of all the medicines, herbs, non-prescription drugs, or dietary supplements you use. Also tell them if you smoke, drink alcohol, or use illegal drugs. Some items may interact with your medicine. What should I watch for while using this medication? Your condition will be monitored carefully while you are receiving this medication. You may need blood work while taking this medication. This medication may make you feel generally unwell. This is not uncommon as chemotherapy can affect healthy cells as well as cancer cells. Report any side effects. Continue your course of treatment even though you feel ill unless your care team tells you to stop. This medication can cause serious allergic reactions. To reduce the risk, your care team may give you other medications to take before receiving this one. Be sure to follow the directions from your care team. This medication may increase your risk of getting an infection. Call your care team for advice if you get a fever, chills, sore throat, or other symptoms of a cold or flu. Do not treat yourself. Try to avoid being around people who are sick. This medication may increase your risk to bruise or bleed. Call your  care team if you notice any unusual bleeding. Be careful brushing or flossing your teeth or using a toothpick because you may get an infection or bleed more easily. If you have any dental work done, tell your dentist you are receiving this medication. Talk to your care team if you or your partner may be pregnant. Serious birth defects can occur if you take this medication during pregnancy and for 6 months after the last dose. You will need a negative pregnancy test before starting this medication. Contraception is recommended while taking this medication and for 6 months after the last dose. Your care team can help you find the option that works for you. If your partner can get pregnant, use a condom during sex while taking this medication and for 3 months after the last dose. Do not breastfeed while taking this medication and for 2 weeks after the last dose. This medication may cause infertility. Talk to your care team if you are concerned about your fertility. What side effects may I notice from receiving this medication? Side effects that you should report to your care  team as soon as possible: Allergic reactions--skin rash, itching, hives, swelling of the face, lips, tongue, or throat Dry cough, shortness of breath or trouble breathing Infection--fever, chills, cough, sore throat, wounds that don't heal, pain or trouble when passing urine, general feeling of discomfort or being unwell Low red blood cell level--unusual weakness or fatigue, dizziness, headache, trouble breathing Pain, tingling, or numbness in the hands or feet Stomach pain, unusual weakness or fatigue, nausea, vomiting, diarrhea, or fever that lasts longer than expected Unusual bruising or bleeding Side effects that usually do not require medical attention (report to your care team if they continue or are bothersome): Diarrhea Fatigue Hair loss Loss of appetite Nausea Vomiting This list may not describe all possible side  effects. Call your doctor for medical advice about side effects. You may report side effects to FDA at 1-800-FDA-1088. Where should I keep my medication? This medication is given in a hospital or clinic. It will not be stored at home. NOTE: This sheet is a summary. It may not cover all possible information. If you have questions about this medicine, talk to your doctor, pharmacist, or health care provider.  2023 Elsevier/Gold Standard (2021-09-17 00:00:00)        To help prevent nausea and vomiting after your treatment, we encourage you to take your nausea medication as directed.  BELOW ARE SYMPTOMS THAT SHOULD BE REPORTED IMMEDIATELY: *FEVER GREATER THAN 100.4 F (38 C) OR HIGHER *CHILLS OR SWEATING *NAUSEA AND VOMITING THAT IS NOT CONTROLLED WITH YOUR NAUSEA MEDICATION *UNUSUAL SHORTNESS OF BREATH *UNUSUAL BRUISING OR BLEEDING *URINARY PROBLEMS (pain or burning when urinating, or frequent urination) *BOWEL PROBLEMS (unusual diarrhea, constipation, pain near the anus) TENDERNESS IN MOUTH AND THROAT WITH OR WITHOUT PRESENCE OF ULCERS (sore throat, sores in mouth, or a toothache) UNUSUAL RASH, SWELLING OR PAIN  UNUSUAL VAGINAL DISCHARGE OR ITCHING   Items with * indicate a potential emergency and should be followed up as soon as possible or go to the Emergency Department if any problems should occur.  Please show the CHEMOTHERAPY ALERT CARD or IMMUNOTHERAPY ALERT CARD at check-in to the Emergency Department and triage nurse.  Should you have questions after your visit or need to cancel or reschedule your appointment, please contact [facility] [phone]  and follow the prompts.  Office hours are 8:00 a.m. to 4:30 p.m. Monday - Friday. Please note that voicemails left after 4:00 p.m. may not be returned until the following business day.  We are closed weekends and major holidays. You have access to a nurse at all times for urgent questions. Please call the main  number to the clinic [phone] and follow the prompts.  For any non-urgent questions, you may also contact your provider using MyChart. We now offer e-Visits for anyone 29 and older to request care online for non-urgent symptoms. For details visit [email or web address].   Also download the MyChart app! Go to the app store, search "MyChart", open the app, select [facility], and log in with your MyChart username and password.  Masks are optional in the cancer centers. If you would like for your care team to wear a mask while they are taking care of you, please let them know. You may have one support person who is at least 85 years old accompany you for your appointments.

## 2022-02-19 NOTE — Progress Notes (Signed)
Patient presents today for chemotherapy infusion. Patient is in satisfactory condition with no complaints voiced.  Vital signs are stable.  Labs reviewed by Dede Query, NP during her office visit.  All labs are within treatment parameters.  We will proceed with treatment per provider orders.   Patient tolerated treatment well with no complaints voiced.  Patient left via wheelchair with daughter in stable condition.  Vital signs stable at discharge.  Follow up as scheduled.

## 2022-02-20 ENCOUNTER — Other Ambulatory Visit: Payer: Self-pay

## 2022-02-25 ENCOUNTER — Other Ambulatory Visit: Payer: Self-pay

## 2022-03-01 ENCOUNTER — Other Ambulatory Visit: Payer: Self-pay

## 2022-03-04 ENCOUNTER — Other Ambulatory Visit: Payer: Self-pay

## 2022-03-05 ENCOUNTER — Inpatient Hospital Stay: Payer: Medicare HMO | Attending: Hematology

## 2022-03-05 ENCOUNTER — Inpatient Hospital Stay: Payer: Medicare HMO

## 2022-03-05 ENCOUNTER — Inpatient Hospital Stay (HOSPITAL_BASED_OUTPATIENT_CLINIC_OR_DEPARTMENT_OTHER): Payer: Medicare HMO | Admitting: Hematology

## 2022-03-05 ENCOUNTER — Other Ambulatory Visit: Payer: Self-pay

## 2022-03-05 VITALS — BP 154/78 | HR 72 | Temp 97.6°F | Resp 18

## 2022-03-05 DIAGNOSIS — N189 Chronic kidney disease, unspecified: Secondary | ICD-10-CM | POA: Insufficient documentation

## 2022-03-05 DIAGNOSIS — Z5111 Encounter for antineoplastic chemotherapy: Secondary | ICD-10-CM | POA: Diagnosis present

## 2022-03-05 DIAGNOSIS — C259 Malignant neoplasm of pancreas, unspecified: Secondary | ICD-10-CM | POA: Diagnosis not present

## 2022-03-05 DIAGNOSIS — Z95828 Presence of other vascular implants and grafts: Secondary | ICD-10-CM | POA: Diagnosis not present

## 2022-03-05 DIAGNOSIS — C250 Malignant neoplasm of head of pancreas: Secondary | ICD-10-CM | POA: Diagnosis present

## 2022-03-05 DIAGNOSIS — D649 Anemia, unspecified: Secondary | ICD-10-CM | POA: Diagnosis not present

## 2022-03-05 LAB — CBC WITH DIFFERENTIAL/PLATELET
Abs Immature Granulocytes: 0.01 10*3/uL (ref 0.00–0.07)
Basophils Absolute: 0.1 10*3/uL (ref 0.0–0.1)
Basophils Relative: 1 %
Eosinophils Absolute: 0.2 10*3/uL (ref 0.0–0.5)
Eosinophils Relative: 3 %
HCT: 30.2 % — ABNORMAL LOW (ref 36.0–46.0)
Hemoglobin: 9.7 g/dL — ABNORMAL LOW (ref 12.0–15.0)
Immature Granulocytes: 0 %
Lymphocytes Relative: 21 %
Lymphs Abs: 1.3 10*3/uL (ref 0.7–4.0)
MCH: 29.4 pg (ref 26.0–34.0)
MCHC: 32.1 g/dL (ref 30.0–36.0)
MCV: 91.5 fL (ref 80.0–100.0)
Monocytes Absolute: 0.9 10*3/uL (ref 0.1–1.0)
Monocytes Relative: 14 %
Neutro Abs: 3.9 10*3/uL (ref 1.7–7.7)
Neutrophils Relative %: 61 %
Platelets: 180 10*3/uL (ref 150–400)
RBC: 3.3 MIL/uL — ABNORMAL LOW (ref 3.87–5.11)
RDW: 14.9 % (ref 11.5–15.5)
WBC: 6.3 10*3/uL (ref 4.0–10.5)
nRBC: 0 % (ref 0.0–0.2)

## 2022-03-05 LAB — COMPREHENSIVE METABOLIC PANEL
ALT: 35 U/L (ref 0–44)
AST: 31 U/L (ref 15–41)
Albumin: 3.3 g/dL — ABNORMAL LOW (ref 3.5–5.0)
Alkaline Phosphatase: 191 U/L — ABNORMAL HIGH (ref 38–126)
Anion gap: 8 (ref 5–15)
BUN: 27 mg/dL — ABNORMAL HIGH (ref 8–23)
CO2: 24 mmol/L (ref 22–32)
Calcium: 9.5 mg/dL (ref 8.9–10.3)
Chloride: 107 mmol/L (ref 98–111)
Creatinine, Ser: 1.27 mg/dL — ABNORMAL HIGH (ref 0.44–1.00)
GFR, Estimated: 41 mL/min — ABNORMAL LOW (ref >60)
Glucose, Bld: 110 mg/dL — ABNORMAL HIGH (ref 70–99)
Potassium: 4.2 mmol/L (ref 3.5–5.1)
Sodium: 139 mmol/L (ref 135–145)
Total Bilirubin: 0.5 mg/dL (ref 0.3–1.2)
Total Protein: 6.4 g/dL — ABNORMAL LOW (ref 6.5–8.1)

## 2022-03-05 LAB — MAGNESIUM: Magnesium: 2.2 mg/dL (ref 1.7–2.4)

## 2022-03-05 MED ORDER — HEPARIN SOD (PORK) LOCK FLUSH 100 UNIT/ML IV SOLN
500.0000 [IU] | Freq: Once | INTRAVENOUS | Status: AC | PRN
  Administered 2022-03-05: 500 [IU]

## 2022-03-05 MED ORDER — SODIUM CHLORIDE 0.9 % IV SOLN: Freq: Once | INTRAVENOUS | Status: AC

## 2022-03-05 MED ORDER — SODIUM CHLORIDE 0.9% FLUSH
10.0000 mL | INTRAVENOUS | Status: DC | PRN
  Administered 2022-03-05: 10 mL

## 2022-03-05 MED ORDER — SODIUM CHLORIDE 0.9 % IV SOLN
10.0000 mg | Freq: Once | INTRAVENOUS | Status: AC
  Administered 2022-03-05: 10 mg via INTRAVENOUS
  Filled 2022-03-05: qty 10

## 2022-03-05 MED ORDER — PACLITAXEL PROTEIN-BOUND CHEMO INJECTION 100 MG
100.0000 mg/m2 | Freq: Once | INTRAVENOUS | Status: AC
  Administered 2022-03-05: 150 mg via INTRAVENOUS
  Filled 2022-03-05: qty 30

## 2022-03-05 MED ORDER — SODIUM CHLORIDE 0.9 % IV SOLN
500.0000 mg/m2 | Freq: Once | INTRAVENOUS | Status: AC
  Administered 2022-03-05: 760 mg via INTRAVENOUS
  Filled 2022-03-05: qty 19.99

## 2022-03-05 MED ORDER — PALONOSETRON HCL INJECTION 0.25 MG/5ML
0.2500 mg | Freq: Once | INTRAVENOUS | Status: AC
  Administered 2022-03-05: 0.25 mg via INTRAVENOUS
  Filled 2022-03-05: qty 5

## 2022-03-05 NOTE — Patient Instructions (Signed)
[facility]  Discharge Instructions: Thank you for choosing [facility] to provide your oncology and hematology care.  If you have a lab appointment with the [facility], please come in thru the Main Entrance and check in at the main information desk.  Wear comfortable clothing and clothing appropriate for easy access to any Portacath or PICC line.   We strive to give you quality time with your provider. You may need to reschedule your appointment if you arrive late (15 or more minutes).  Arriving late affects you and other patients whose appointments are after yours.  Also, if you miss three or more appointments without notifying the office, you may be dismissed from the clinic at the provider's discretion.      For prescription refill requests, have your pharmacy contact our office and allow 72 hours for refills to be completed.    Today you received the following chemotherapy and/or immunotherapy agents Abraxane/Gemzar.  Gemcitabine Injection What is this medication? GEMCITABINE (jem SYE ta been) treats some types of cancer. It works by slowing down the growth of cancer cells. This medicine may be used for other purposes; ask your health care provider or pharmacist if you have questions. COMMON BRAND NAME(S): Gemzar, Infugem What should I tell my care team before I take this medication? They need to know if you have any of these conditions: Blood disorders Infection Kidney disease Liver disease Lung or breathing disease, such as asthma or COPD Recent or ongoing radiation therapy An unusual or allergic reaction to gemcitabine, other medications, foods, dyes, or preservatives If you or your partner are pregnant or trying to get pregnant Breast-feeding How should I use this medication? This medication is injected into a vein. It is given by your care team in a hospital or clinic setting. Talk to your care team about the use of this medication in  children. Special care may be needed. Overdosage: If you think you have taken too much of this medicine contact a poison control center or emergency room at once. NOTE: This medicine is only for you. Do not share this medicine with others. What if I miss a dose? Keep appointments for follow-up doses. It is important not to miss your dose. Call your care team if you are unable to keep an appointment. What may interact with this medication? Interactions have not been studied. This list may not describe all possible interactions. Give your health care provider a list of all the medicines, herbs, non-prescription drugs, or dietary supplements you use. Also tell them if you smoke, drink alcohol, or use illegal drugs. Some items may interact with your medicine. What should I watch for while using this medication? Your condition will be monitored carefully while you are receiving this medication. This medication may make you feel generally unwell. This is not uncommon, as chemotherapy can affect healthy cells as well as cancer cells. Report any side effects. Continue your course of treatment even though you feel ill unless your care team tells you to stop. In some cases, you may be given additional medications to help with side effects. Follow all directions for their use. This medication may increase your risk of getting an infection. Call your care team for advice if you get a fever, chills, sore throat, or other symptoms of a cold or flu. Do not treat yourself. Try to avoid being around people who are sick. This medication may increase your risk to bruise or bleed. Call your care  team if you notice any unusual bleeding. Be careful brushing or flossing your teeth or using a toothpick because you may get an infection or bleed more easily. If you have any dental work done, tell your dentist you are receiving this medication. Avoid taking medications that contain aspirin, acetaminophen, ibuprofen, naproxen,  or ketoprofen unless instructed by your care team. These medications may hide a fever. Talk to your care team if you or your partner wish to become pregnant or think you might be pregnant. This medication can cause serious birth defects if taken during pregnancy and for 6 months after the last dose. A negative pregnancy test is required before starting this medication. A reliable form of contraception is recommended while taking this medication and for 6 months after the last dose. Talk to your care team about effective forms of contraception. Do not father a child while taking this medication and for 3 months after the last dose. Use a condom while having sex during this time period. Do not breastfeed while taking this medication and for at least 1 week after the last dose. This medication may cause infertility. Talk to your care team if you are concerned about your fertility. What side effects may I notice from receiving this medication? Side effects that you should report to your care team as soon as possible: Allergic reactions--skin rash, itching, hives, swelling of the face, lips, tongue, or throat Capillary leak syndrome--stomach or muscle pain, unusual weakness or fatigue, feeling faint or lightheaded, decrease in the amount of urine, swelling of the ankles, hands, or feet, trouble breathing Infection--fever, chills, cough, sore throat, wounds that don't heal, pain or trouble when passing urine, general feeling of discomfort or being unwell Liver injury--right upper belly pain, loss of appetite, nausea, light-colored stool, dark yellow or [place] urine, yellowing skin or eyes, unusual weakness or fatigue Low red blood cell level--unusual weakness or fatigue, dizziness, headache, trouble breathing Lung injury--shortness of breath or trouble breathing, cough, spitting up blood, chest pain, fever Stomach pain, bloody diarrhea, pale skin, unusual weakness or fatigue, decrease in the amount of urine,  which may be signs of hemolytic uremic syndrome Sudden and severe headache, confusion, change in vision, seizures, which may be signs of posterior reversible encephalopathy syndrome (PRES) Unusual bruising or bleeding Side effects that usually do not require medical attention (report to your care team if they continue or are bothersome): Diarrhea Drowsiness Hair loss Nausea Pain, redness, or swelling with sores inside the mouth or throat Vomiting This list may not describe all possible side effects. Call your doctor for medical advice about side effects. You may report side effects to FDA at 1-800-FDA-1088. Where should I keep my medication? This medication is given in a hospital or clinic. It will not be stored at home. NOTE: This sheet is a summary. It may not cover all possible information. If you have questions about this medicine, talk to your doctor, pharmacist, or health care provider.  2023 Elsevier/Gold Standard (2021-09-17 00:00:00)   Paclitaxel Nanoparticle Albumin-Bound Injection What is this medication? NANOPARTICLE ALBUMIN-BOUND PACLITAXEL (Na no PAHR ti kuhl al BYOO muhn-bound PAK li TAX el) treats some types of cancer. It works by slowing down the growth of cancer cells. This medicine may be used for other purposes; ask your health care provider or pharmacist if you have questions. COMMON BRAND NAME(S): Abraxane What should I tell my care team before I take this medication? They need to know if you have any of these conditions: Liver  disease Low white blood cell levels An unusual or allergic reaction to paclitaxel, albumin, other medications, foods, dyes, or preservatives If you or your partner are pregnant or trying to get pregnant Breast-feeding How should I use this medication? This medication is injected into a vein. It is given by your care team in a hospital or clinic setting. Talk to your care team about the use of this medication in children. Special care may  be needed. Overdosage: If you think you have taken too much of this medicine contact a poison control center or emergency room at once. NOTE: This medicine is only for you. Do not share this medicine with others. What if I miss a dose? Keep appointments for follow-up doses. It is important not to miss your dose. Call your care team if you are unable to keep an appointment. What may interact with this medication? Other medications may affect the way this medication works. Talk with your care team about all of the medications you take. They may suggest changes to your treatment plan to lower the risk of side effects and to make sure your medications work as intended. This list may not describe all possible interactions. Give your health care provider a list of all the medicines, herbs, non-prescription drugs, or dietary supplements you use. Also tell them if you smoke, drink alcohol, or use illegal drugs. Some items may interact with your medicine. What should I watch for while using this medication? Your condition will be monitored carefully while you are receiving this medication. You may need blood work while taking this medication. This medication may make you feel generally unwell. This is not uncommon as chemotherapy can affect healthy cells as well as cancer cells. Report any side effects. Continue your course of treatment even though you feel ill unless your care team tells you to stop. This medication can cause serious allergic reactions. To reduce the risk, your care team may give you other medications to take before receiving this one. Be sure to follow the directions from your care team. This medication may increase your risk of getting an infection. Call your care team for advice if you get a fever, chills, sore throat, or other symptoms of a cold or flu. Do not treat yourself. Try to avoid being around people who are sick. This medication may increase your risk to bruise or bleed. Call your  care team if you notice any unusual bleeding. Be careful brushing or flossing your teeth or using a toothpick because you may get an infection or bleed more easily. If you have any dental work done, tell your dentist you are receiving this medication. Talk to your care team if you or your partner may be pregnant. Serious birth defects can occur if you take this medication during pregnancy and for 6 months after the last dose. You will need a negative pregnancy test before starting this medication. Contraception is recommended while taking this medication and for 6 months after the last dose. Your care team can help you find the option that works for you. If your partner can get pregnant, use a condom during sex while taking this medication and for 3 months after the last dose. Do not breastfeed while taking this medication and for 2 weeks after the last dose. This medication may cause infertility. Talk to your care team if you are concerned about your fertility. What side effects may I notice from receiving this medication? Side effects that you should report to your care  team as soon as possible: Allergic reactions--skin rash, itching, hives, swelling of the face, lips, tongue, or throat Dry cough, shortness of breath or trouble breathing Infection--fever, chills, cough, sore throat, wounds that don't heal, pain or trouble when passing urine, general feeling of discomfort or being unwell Low red blood cell level--unusual weakness or fatigue, dizziness, headache, trouble breathing Pain, tingling, or numbness in the hands or feet Stomach pain, unusual weakness or fatigue, nausea, vomiting, diarrhea, or fever that lasts longer than expected Unusual bruising or bleeding Side effects that usually do not require medical attention (report to your care team if they continue or are bothersome): Diarrhea Fatigue Hair loss Loss of appetite Nausea Vomiting This list may not describe all possible side  effects. Call your doctor for medical advice about side effects. You may report side effects to FDA at 1-800-FDA-1088. Where should I keep my medication? This medication is given in a hospital or clinic. It will not be stored at home. NOTE: This sheet is a summary. It may not cover all possible information. If you have questions about this medicine, talk to your doctor, pharmacist, or health care provider.  2023 Elsevier/Gold Standard (2021-09-17 00:00:00)        To help prevent nausea and vomiting after your treatment, we encourage you to take your nausea medication as directed.  BELOW ARE SYMPTOMS THAT SHOULD BE REPORTED IMMEDIATELY: *FEVER GREATER THAN 100.4 F (38 C) OR HIGHER *CHILLS OR SWEATING *NAUSEA AND VOMITING THAT IS NOT CONTROLLED WITH YOUR NAUSEA MEDICATION *UNUSUAL SHORTNESS OF BREATH *UNUSUAL BRUISING OR BLEEDING *URINARY PROBLEMS (pain or burning when urinating, or frequent urination) *BOWEL PROBLEMS (unusual diarrhea, constipation, pain near the anus) TENDERNESS IN MOUTH AND THROAT WITH OR WITHOUT PRESENCE OF ULCERS (sore throat, sores in mouth, or a toothache) UNUSUAL RASH, SWELLING OR PAIN  UNUSUAL VAGINAL DISCHARGE OR ITCHING   Items with * indicate a potential emergency and should be followed up as soon as possible or go to the Emergency Department if any problems should occur.  Please show the CHEMOTHERAPY ALERT CARD or IMMUNOTHERAPY ALERT CARD at check-in to the Emergency Department and triage nurse.  Should you have questions after your visit or need to cancel or reschedule your appointment, please contact [facility] [phone]  and follow the prompts.  Office hours are 8:00 a.m. to 4:30 p.m. Monday - Friday. Please note that voicemails left after 4:00 p.m. may not be returned until the following business day.  We are closed weekends and major holidays. You have access to a nurse at all times for urgent questions. Please call the main  number to the clinic [phone] and follow the prompts.  For any non-urgent questions, you may also contact your provider using MyChart. We now offer e-Visits for anyone 50 and older to request care online for non-urgent symptoms. For details visit [email or web address].   Also download the MyChart app! Go to the app store, search "MyChart", open the app, select [facility], and log in with your MyChart username and password.  Masks are optional in the cancer centers. If you would like for your care team to wear a mask while they are taking care of you, please let them know. You may have one support person who is at least 85 years old accompany you for your appointments.

## 2022-03-05 NOTE — Progress Notes (Signed)
Patient presents today for chemotherapy infusion.  Patient is in satisfactory condition with no new complaints voiced.  Vital signs are stable.  Labs reviewed by Dr. Katragadda during her office visit.  All labs are within treatment parameters.  We will proceed with treatment per MD orders.   Patient tolerated treatment well with no complaints voiced.  Patient left ambulatory in stable condition.  Vital signs stable at discharge.  Follow up as scheduled.    

## 2022-03-05 NOTE — Progress Notes (Signed)
Meagan [name], [place] [address]   CLINIC:  Medical Oncology/Hematology  PCP:  Meagan [name], Meagan [name] [phone]   REASON FOR VISIT:  Follow-up for pancreatic adenocarcinoma  PRIOR THERAPY: none  NGS Results: not done  CURRENT THERAPY: under work-up  BRIEF ONCOLOGIC HISTORY:  Oncology History  Pancreatic carcinoma ([place])  11/11/2021 Initial Diagnosis   Pancreatic carcinoma ([place])   02/05/2022 -  Chemotherapy   Patient is on Treatment Plan : PANCREATIC Abraxane D1,8,15 + Gemcitabine D1,8,15 q28d       CANCER STAGING:  Cancer Staging  Pancreatic carcinoma ([place]) Staging form: Exocrine Pancreas, AJCC 8th Edition - Clinical stage from 11/11/2021: Stage IB (cT2, cN0, cM0) - Unsigned   INTERVAL HISTORY:  [name], a 87 y.o. female, seen for follow-up and toxicity assessment prior to cycle 3 of chemotherapy.  She reports some neck pain from arthritis.  She reports some fatigue lasting about couple of days after each treatment which starts typically on the fourth day after treatment.  Reports some numbness in the feet which is stable.  REVIEW OF SYSTEMS:  Review of Systems  Constitutional:  Negative for appetite change, fatigue and unexpected weight change.  Gastrointestinal:  Positive for constipation. Negative for abdominal pain.  Neurological:  Positive for dizziness and numbness (Feet).  All other systems reviewed and are negative.   PAST MEDICAL/SURGICAL HISTORY:  Past Medical History:  Diagnosis Date   Essential tremor    GERD (gastroesophageal reflux disease)    High cholesterol    Hypertension    PONV (postoperative nausea and vomiting)    Port-A-Cath in place 01/27/2022   Vertigo    Past Surgical History:  Procedure Laterality Date   BILIARY BRUSHING  09/10/2021   Procedure: BILIARY BRUSHING;  Surgeon: Irene Shipper, MD;  Location: [facility] ENDOSCOPY;  Service:  Gastroenterology;;   BILIARY BRUSHING  10/29/2021   Procedure: BILIARY BRUSHING;  Surgeon: Irving Copas., MD;  Location: [facility] ENDOSCOPY;  Service: Gastroenterology;;   BILIARY DILATION  10/29/2021   Procedure: BILIARY DILATION;  Surgeon: Irving Copas., MD;  Location: [facility] ENDOSCOPY;  Service: Gastroenterology;;   BILIARY STENT PLACEMENT  09/10/2021   Procedure: BILIARY STENT PLACEMENT;  Surgeon: Irene Shipper, MD;  Location: [facility] ENDOSCOPY;  Service: Gastroenterology;;   BILIARY STENT PLACEMENT N/A 10/03/2021   Procedure: BILIARY STENT PLACEMENT;  Surgeon: Gatha Mayer, MD;  Location: [facility] ENDOSCOPY;  Service: Gastroenterology;  Laterality: N/A;   BILIARY STENT PLACEMENT N/A 10/29/2021   Procedure: BILIARY STENT PLACEMENT;  Surgeon: Rush Landmark Telford Nab., MD;  Location: [facility] ENDOSCOPY;  Service: Gastroenterology;  Laterality: N/A;   BIOPSY  10/29/2021   Procedure: BIOPSY;  Surgeon: Rush Landmark Telford Nab., MD;  Location: [facility] ENDOSCOPY;  Service: Gastroenterology;;   ENDOSCOPIC RETROGRADE CHOLANGIOPANCREATOGRAPHY (ERCP) WITH PROPOFOL N/A 10/29/2021   Procedure: ENDOSCOPIC RETROGRADE CHOLANGIOPANCREATOGRAPHY (ERCP) WITH PROPOFOL;  Surgeon: Irving Copas., MD;  Location: [facility] ENDOSCOPY;  Service: Gastroenterology;  Laterality: N/A;   ERCP N/A 09/10/2021   Procedure: ENDOSCOPIC RETROGRADE CHOLANGIOPANCREATOGRAPHY (ERCP);  Surgeon: Irene Shipper, MD;  Location: [place];  Service: Gastroenterology;  Laterality: N/A;   ERCP N/A 10/03/2021   Procedure: ENDOSCOPIC RETROGRADE CHOLANGIOPANCREATOGRAPHY (ERCP);  Surgeon: Gatha Mayer, MD;  Location: [facility] ENDOSCOPY;  Service: Gastroenterology;  Laterality: N/A;   ESOPHAGOGASTRODUODENOSCOPY (EGD) WITH PROPOFOL N/A 10/29/2021   Procedure: ESOPHAGOGASTRODUODENOSCOPY (EGD) WITH PROPOFOL;  Surgeon: Rush Landmark Telford Nab., MD;  Location: [facility] ENDOSCOPY;  Service: Gastroenterology;  Laterality: N/A;   EUS N/A 10/29/2021   Procedure: UPPER ENDOSCOPIC  ULTRASOUND (EUS) RADIAL;  Surgeon: Irving Copas., MD;  Location: [facility] ENDOSCOPY;  Service: Gastroenterology;  Laterality: N/A;   FINE NEEDLE ASPIRATION  10/29/2021   Procedure: FINE NEEDLE ASPIRATION (FNA) LINEAR;  Surgeon: Irving Copas., MD;  Location: [facility] ENDOSCOPY;  Service: Gastroenterology;;   IR IMAGING GUIDED PORT INSERTION  01/28/2022   PACEMAKER IMPLANT     REMOVAL OF STONES  10/29/2021   Procedure: REMOVAL OF SLUDGE;  Surgeon: Irving Copas., MD;  Location: [facility] ENDOSCOPY;  Service: Gastroenterology;;   Joan Mayans  09/10/2021   Procedure: Joan Mayans;  Surgeon: Irene Shipper, MD;  Location: [facility] END[facility]COPY;  Service: Gastroenterology;;   Bess Kinds CHOLANGIOSCOPY N/A 10/29/2021   Procedure: [id number] CHOLANGIOSCOPY;  Surgeon: Irving Copas., MD;  Location: [facility] ENDOSCOPY;  Service: Gastroenterology;  Laterality: N/A;   STENT REMOVAL  10/03/2021   Procedure: STENT REMOVAL;  Surgeon: Gatha Mayer, MD;  Location: [facility] ENDOS[facility];  Service: Gastroenterology;;    SOCIAL HISTORY:  Social History   Socioeconomic History   Marital status: Married    Spouse name: Not on file   Number of children: Not on file   Years of education: Not on file   Highest education level: Not on file  Occupational History   Not on file  Tobacco Use   Smoking status: Never   Smokeless tobacco: Never  Vaping Use   Vaping Use: Never used  Substance and Sexual Activity   Alcohol use: Never   Drug use: Never   Sexual activity: Not on file  Other Topics Concern   Not on file  Social History Narrative   Not on file   Social Determinants of Health   Financial Resource Strain: Not on file  Food Insecurity: Not on file  Transportation Needs: Not on file  Physical Activity: Not on file  Stress: Not on file  Social Connections: Not on file  Intimate Partner Violence: Not on file    FAMILY HISTORY:  Family History  Problem Relation Age of Onset   Stomach cancer Neg Hx     Esophageal cancer Neg Hx    Colon cancer Neg Hx    Pancreatic cancer Neg Hx     CURRENT MEDICATIONS:  Current Outpatient Medications  Medication Sig Dispense Refill   amLODipine (NORVASC) 2.5 MG tablet Take 2.5 mg by mouth at bedtime.     aspirin EC 81 MG tablet Take 81 mg by mouth daily. Swallow whole.     Cholecalciferol (VITAMIN D3) 50 MCG (2000 UT) TABS Take 2,000 Units by mouth daily after supper.     Coenzyme Q10 (CO Q-10) 100 MG CAPS Take 100 mg by mouth every evening.     famotidine (PEPCID) 40 MG tablet Take 40 mg by mouth every evening.     gabapentin (NEURONTIN) 100 MG capsule Take 100 mg by mouth See admin instructions. Take 100 mg twice daily, may take a third 100 mg dose as needed for pain     GEMCITABINE HCL IV Inject into the vein every 14 (fourteen) days.     lidocaine-prilocaine (EMLA) cream Apply a small amount to port a cath site (do not rub in) and cover with plastic wrap 1 hour prior to infusion appointments 30 g 3   meclizine (ANTIVERT) 25 MG tablet Take 25 mg by mouth daily as needed for dizziness.     Misc Natural Products (JOINT SUPPORT) CAPS Take 1 capsule by mouth daily  with breakfast.     Multiple Vitamin (MULTIVITAMIN) tablet Take 1 tablet by mouth daily with breakfast.     Multiple Vitamins-Minerals (OCUVITE EYE HEALTH FORMULA) CAPS Take 1 capsule by mouth daily.     nebivolol (BYSTOLIC) 5 MG tablet Take 2.5 mg by mouth in the morning.     omeprazole (PRILOSEC) 40 MG capsule Take 40 mg by mouth daily before breakfast.     ondansetron (ZOFRAN-ODT) 4 MG disintegrating tablet Take 4 mg by mouth every 8 (eight) hours as needed for nausea or vomiting.     PACLitaxel-protein bound in Empty Containers Flexible 1 each Inject into the vein every 14 (fourteen) days.     polyethylene glycol (MIRALAX) 17 g packet Take 17 g by mouth daily as needed. (Patient taking differently: Take 17 g by mouth daily as needed for moderate constipation.) 14 each 0   Polyvinyl  Alcohol-Povidone (REFRESH OP) Place 1 drop into both eyes daily as needed (tired eyes).     primidone (MYSOLINE) 50 MG tablet Take 25 mg by mouth daily as needed (for tremors).     prochlorperazine (COMPAZINE) 10 MG tablet Take 1 tablet (10 mg total) by mouth every 6 (six) hours as needed for nausea or vomiting. 60 tablet 6   pyridOXINE (VITAMIN B-6) 100 MG tablet Take 50 mg by mouth in the morning.     tiZANidine (ZANAFLEX) 2 MG tablet Take 1 mg by mouth daily as needed for muscle spasms.     No current facility-administered medications for this visit.    ALLERGIES:  Allergies  Allergen Reactions   Ivp Dye [Iodinated Contrast Media] Hives   Lisinopril Cough    PHYSICAL EXAM:  Performance status (ECOG): 1 - Symptomatic but completely ambulatory  There were no vitals filed for this visit.  Wt Readings from Last 3 Encounters:  03/05/22 113 lb 3.2 oz (51.3 kg)  02/19/22 113 lb (51.3 kg)  02/05/22 113 lb 12.8 oz (51.6 kg)   Physical Exam Vitals reviewed.  Constitutional:      Appearance: Normal appearance.  Cardiovascular:     Rate and Rhythm: Normal rate and regular rhythm.     Pulses: Normal pulses.     Heart sounds: Normal heart sounds.  Pulmonary:     Effort: Pulmonary effort is normal.     Breath sounds: Normal breath sounds.  Neurological:     General: No focal deficit present.     Mental Status: She is alert and oriented to person, place, and time.  Psychiatric:        Mood and Affect: Mood normal.        Behavior: Behavior normal.      LABORATORY DATA:  I have reviewed the labs as listed.     Latest Ref Rng & Units 03/05/2022    8:47 AM 02/19/2022    9:22 AM 02/12/2022    2:01 PM  CBC  WBC 4.0 - 10.5 K/uL 6.3  6.0  5.5   Hemoglobin 12.0 - 15.0 g/dL 9.7  9.6  9.6   Hematocrit 36.0 - 46.0 % 30.2  30.3  30.2   Platelets 150 - 400 K/uL 180  345  149       Latest Ref Rng & Units 03/05/2022    8:47 AM 02/19/2022    9:22 AM 02/12/2022    2:01 PM  CMP  Glucose  70 - 99 mg/dL 110  123  138   BUN 8 - 23 mg/dL 27  27  33  Creatinine 0.44 - 1.00 mg/dL 1.27  1.26  1.17   Sodium 135 - 145 mmol/L 139  134  133   Potassium 3.5 - 5.1 mmol/L 4.2  4.5  4.5   Chloride 98 - 111 mmol/L 107  105  102   CO2 22 - 32 mmol/L '24  24  25   '$ Calcium 8.9 - 10.3 mg/dL 9.5  9.7  9.9   Total Protein 6.5 - 8.1 g/dL 6.4  6.3  6.5   Total Bilirubin 0.3 - 1.2 mg/dL 0.5  0.5  0.8   Alkaline Phos 38 - 126 U/L 191  236  307   AST 15 - 41 U/L '31  29  28   '$ ALT 0 - 44 U/L 35  40  62     DIAGNOSTIC IMAGING:  I have independently reviewed the scans and discussed with the patient. No results found.   ASSESSMENT:  T2 N0 M1 pancreatic adenocarcinoma: - She presented with painless jaundice in April 2023.  Weight loss of 13 pounds since April. - Stent placement on 09/10/2021 for a bilirubin of 8.2. - 10/03/2021: ERCP and stent removal and stent placement for stent dysfunction with total bilirubin 16.3. - 10/29/2021: ERCP/EUS: Lower CBD stent stenosis from stricture, status post metal stent placement.  EUS showed mass in the superior pancreatic head measuring 2.8 x 2.2 cm, suggesting abutment of the portal vein.  Intact interface was seen between lesion and SMA and celiac trunk suggesting lack of invasion.  Hypoechoic 14 x 12 mm lesion visualized in the liver.  Biopsy could not be attempted due to presence of blood vessel. - She had syncopal episodes in 06-Jul-2022, had pacemaker placement for bradycardia. - Pathology: Pancreatic head FNA and biliary stricture brushing: Malignant cells consistent with adenocarcinoma. - CA 19-9: 131 - We reviewed MRI of the abdomen with and without contrast (12/24/2021): Pancreatic head mass measures 2.7 x 2.3 cm (2.5 x 1.8 cm previously).  Mass is abutting and slightly displacing the SMA.  No obvious fat plane is demonstrated.  Portal and splenic veins are patent.  Stable small scattered hepatic cysts with no liver metastatic disease. - PET scan (11/20/2021):  Ill-defined low-level hypermetabolic activity about the metallic CBD stent and pancreatic uncinate process without discrete CT correlate.  No convincing evidence of hypermetabolic disease in the neck, chest, abdomen or pelvis.  Tiny bilateral lung nodules measuring up to 3 mm.  -CT pancreatic protocol (01/16/2022): Hypoenhancing mass in the pancreatic head measuring 2.9 x 1.8 cm.  Slightly greater than 180 degrees of abutment with portal vein as well as SMA without definite vascular encasement.  New enhancing 9 mm soft tissue nodule along the posterior aspect of the gallbladder wall?  Suspicious for peritoneal implant. -I have discussed with Dr. Zenia Resides who has seen this patient. -Dr. Zenia Resides thought that this patient might not be a candidate for surgery but she would like to evaluate after 3 to 4 months of neoadjuvant chemotherapy. -We talked about neoadjuvant chemotherapy.  She is not a candidate for FOLFIRINOX.  I have recommended gemcitabine and Abraxane every other week.  Cycle 1 was started on 02/05/2022.  Social/family history: - She lives at home with her husband who has dementia.  She worked as a Char[employer] fundraiser for 33 years.  Non-smoker and no exposure to chemicals.  She has not been driving since 07/06/22. - Daughter died of glioblastoma.  Mother had ovarian cancer.  Maternal aunt had breast cancer.   PLAN:  T2 N0 M0  pancreatic adenocarcinoma: - She received cycle 1 on 02/05/2022 and day 15 on 02/19/2022. - She had some fatigue lasting about 2 days, started on day 4 of treatment. - Mild constipation from 5-HT 3 blockers.  Continue stool softeners. - Reviewed labs today which showed almond 3.3 and rest of LFTs are normal.  CKD stable with creatinine 1.27.  CBC was grossly normal with mild anemia. - Proceed with cycle 2-day 1 today without any dose modifications.  If she tolerates well, will consider increasing dose on day 15.  RTC 2 weeks for follow-up. - We will also obtain tumor marker for  follow-up..  2.  Weight loss: - She is eating better.  Weight is stable.    Orders placed this encounter:  Orders Placed This Encounter  Procedures   CBC with Differential   Comprehensive metabolic panel   Magnesium   Cancer antigen 19-9      Derek Jack, MD [place] [phone]

## 2022-03-05 NOTE — Patient Instructions (Signed)
[place] at [facility] Discharge Instructions   You were seen and examined today by Dr. Delton Coombes.  He reviewed the results of your lab work which are normal/stable.   We will proceed with cycle 2 of treatment day.   Return as scheduled in 2 weeks.    Thank you for choosing [place] at [facility] to provide your oncology and hematology care.  To afford each patient quality time with our provider, please arrive at least 15 minutes before your scheduled appointment time.   If you have a lab appointment with the [facility] please come in thru the Main Entrance and check in at the main information desk.  You need to re-schedule your appointment should you arrive 10 or more minutes late.  We strive to give you quality time with our providers, and arriving late affects you and other patients whose appointments are after yours.  Also, if you no show three or more times for appointments you may be dismissed from the clinic at the providers discretion.     Again, thank you for choosing [facility].  Our hope is that these requests will decrease the amount of time that you wait before being seen by our physicians.       _____________________________________________________________  Should you have questions after your visit to [facility], please contact our office at [phone] and follow the prompts.  Our office hours are 8:00 a.m. and 4:30 p.m. Monday - Friday.  Please note that voicemails left after 4:00 p.m. may not be returned until the following business day.  We are closed weekends and major holidays.  You do have access to a nurse 24-7, just call the main number to the clinic [phone] and do not press any options, hold on the line and a nurse will answer the phone.    For prescription refill requests, have your pharmacy contact our office and allow 72 hours.    Due to Covid, you will need to wear a mask  upon entering the hospital. If you do not have a mask, a mask will be given to you at the Main Entrance upon arrival. For doctor visits, patients may have 1 support person age 35 or older with them. For treatment visits, patients can not have anyone with them due to social distancing guidelines and our immunocompromised population.

## 2022-03-12 ENCOUNTER — Other Ambulatory Visit: Payer: Self-pay

## 2022-03-19 ENCOUNTER — Inpatient Hospital Stay: Payer: Medicare HMO

## 2022-03-19 ENCOUNTER — Inpatient Hospital Stay (HOSPITAL_BASED_OUTPATIENT_CLINIC_OR_DEPARTMENT_OTHER): Payer: Medicare HMO | Admitting: Physician Assistant

## 2022-03-19 VITALS — BP 146/76 | HR 83 | Temp 97.6°F | Resp 18 | Wt 114.8 lb

## 2022-03-19 DIAGNOSIS — C259 Malignant neoplasm of pancreas, unspecified: Secondary | ICD-10-CM

## 2022-03-19 DIAGNOSIS — Z95828 Presence of other vascular implants and grafts: Secondary | ICD-10-CM

## 2022-03-19 DIAGNOSIS — Z5111 Encounter for antineoplastic chemotherapy: Secondary | ICD-10-CM | POA: Diagnosis not present

## 2022-03-19 LAB — CBC WITH DIFFERENTIAL/PLATELET
Abs Immature Granulocytes: 0.02 10*3/uL (ref 0.00–0.07)
Basophils Absolute: 0.1 10*3/uL (ref 0.0–0.1)
Basophils Relative: 1 %
Eosinophils Absolute: 0.2 10*3/uL (ref 0.0–0.5)
Eosinophils Relative: 2 %
HCT: 30.7 % — ABNORMAL LOW (ref 36.0–46.0)
Hemoglobin: 9.8 g/dL — ABNORMAL LOW (ref 12.0–15.0)
Immature Granulocytes: 0 %
Lymphocytes Relative: 22 %
Lymphs Abs: 1.3 10*3/uL (ref 0.7–4.0)
MCH: 29.3 pg (ref 26.0–34.0)
MCHC: 31.9 g/dL (ref 30.0–36.0)
MCV: 91.6 fL (ref 80.0–100.0)
Monocytes Absolute: 0.8 10*3/uL (ref 0.1–1.0)
Monocytes Relative: 12 %
Neutro Abs: 3.9 10*3/uL (ref 1.7–7.7)
Neutrophils Relative %: 63 %
Platelets: 232 10*3/uL (ref 150–400)
RBC: 3.35 MIL/uL — ABNORMAL LOW (ref 3.87–5.11)
RDW: 15 % (ref 11.5–15.5)
WBC: 6.2 10*3/uL (ref 4.0–10.5)
nRBC: 0 % (ref 0.0–0.2)

## 2022-03-19 LAB — COMPREHENSIVE METABOLIC PANEL
ALT: 72 U/L — ABNORMAL HIGH (ref 0–44)
AST: 87 U/L — ABNORMAL HIGH (ref 15–41)
Albumin: 3.3 g/dL — ABNORMAL LOW (ref 3.5–5.0)
Alkaline Phosphatase: 265 U/L — ABNORMAL HIGH (ref 38–126)
Anion gap: 6 (ref 5–15)
BUN: 25 mg/dL — ABNORMAL HIGH (ref 8–23)
CO2: 26 mmol/L (ref 22–32)
Calcium: 9.5 mg/dL (ref 8.9–10.3)
Chloride: 106 mmol/L (ref 98–111)
Creatinine, Ser: 1.04 mg/dL — ABNORMAL HIGH (ref 0.44–1.00)
GFR, Estimated: 53 mL/min — ABNORMAL LOW (ref >60)
Glucose, Bld: 115 mg/dL — ABNORMAL HIGH (ref 70–99)
Potassium: 4.1 mmol/L (ref 3.5–5.1)
Sodium: 138 mmol/L (ref 135–145)
Total Bilirubin: 0.5 mg/dL (ref 0.3–1.2)
Total Protein: 6.5 g/dL (ref 6.5–8.1)

## 2022-03-19 LAB — MAGNESIUM: Magnesium: 2 mg/dL (ref 1.7–2.4)

## 2022-03-19 MED ORDER — SODIUM CHLORIDE 0.9 % IV SOLN: Freq: Once | INTRAVENOUS | Status: AC

## 2022-03-19 MED ORDER — SODIUM CHLORIDE 0.9 % IV SOLN
10.0000 mg | Freq: Once | INTRAVENOUS | Status: AC
  Administered 2022-03-19: 10 mg via INTRAVENOUS
  Filled 2022-03-19: qty 10

## 2022-03-19 MED ORDER — HEPARIN SOD (PORK) LOCK FLUSH 100 UNIT/ML IV SOLN
500.0000 [IU] | Freq: Once | INTRAVENOUS | Status: AC | PRN
  Administered 2022-03-19: 500 [IU]

## 2022-03-19 MED ORDER — SODIUM CHLORIDE 0.9 % IV SOLN
500.0000 mg/m2 | Freq: Once | INTRAVENOUS | Status: AC
  Administered 2022-03-19: 760 mg via INTRAVENOUS
  Filled 2022-03-19: qty 19.99

## 2022-03-19 MED ORDER — PACLITAXEL PROTEIN-BOUND CHEMO INJECTION 100 MG
100.0000 mg/m2 | Freq: Once | INTRAVENOUS | Status: AC
  Administered 2022-03-19: 150 mg via INTRAVENOUS
  Filled 2022-03-19: qty 30

## 2022-03-19 MED ORDER — SODIUM CHLORIDE 0.9% FLUSH
10.0000 mL | INTRAVENOUS | Status: DC | PRN
  Administered 2022-03-19: 10 mL

## 2022-03-19 MED ORDER — PALONOSETRON HCL INJECTION 0.25 MG/5ML
0.2500 mg | Freq: Once | INTRAVENOUS | Status: AC
  Administered 2022-03-19: 0.25 mg via INTRAVENOUS
  Filled 2022-03-19: qty 5

## 2022-03-19 NOTE — Progress Notes (Signed)
[address], [place] [address]   CLINIC:  Medical Oncology/Hematology  PCP:  Abigail Miyamoto, [place] / [place] [address] [phone]   REASON FOR VISIT:  Follow-up for pancreatic adenocarcinoma  PRIOR THERAPY: none  NGS Results: not done  CURRENT THERAPY: Gemcitabine and Abraxane, started on 02/05/2022.   BRIEF ONCOLOGIC HISTORY:  Oncology History  Pancreatic carcinoma ([place])  11/11/2021 Initial Diagnosis   Pancreatic carcinoma ([place])   02/05/2022 -  Chemotherapy   Patient is on Treatment Plan : PANCREATIC Abraxane D1,8,15 + Gemcitabine D1,8,15 q28d       CANCER STAGING:  Cancer Staging  Pancreatic carcinoma ([place]) Staging form: Exocrine Pancreas, AJCC 8th Edition - Clinical stage from 11/11/2021: Stage IB (cT2, cN0, cM0) - Unsigned   INTERVAL HISTORY:  [name], a 87 y.o. female, seen for follow-up and toxicity assessment prior to cycle 2, day 15 of chemotherapy.  She is accompanied by her daughter for this visit.   Reports that she is tolerating treatment well without any significant limitations.  She does experience some fatigue but continues to complete her daily activities on her own.  She denies any appetite changes or weight loss.  Patient denies nausea, vomiting or abdominal pain.  She did experience some abdominal cramping after eating raw cabbage the other day but that resolved on its own.  She does experience constipation with daily bowel movements.  She is taking Senokot once a day with minimal relief.  She denies easy bruising or signs of active bleeding.  Patient reports having shortness of breath mainly with heavy exertion. She has stable neuropathy in her feet that is unchanged with chemotherapy.  She denies fevers, chills, night sweats, shortness of breath, chest pain, cough.  She has no other complaints.   REVIEW OF SYSTEMS:  Review of Systems  Constitutional:  Negative for appetite  change, fatigue and unexpected weight change.  Respiratory:  Negative for shortness of breath.   Cardiovascular:  Negative for chest pain.  Gastrointestinal:  Positive for constipation. Negative for abdominal pain, blood in stool, diarrhea, nausea and vomiting.  Skin:  Negative for rash.  Neurological:  Positive for numbness (Feet). Negative for dizziness and headaches.  All other systems reviewed and are negative.   PAST MEDICAL/SURGICAL HISTORY:  Past Medical History:  Diagnosis Date   Essential tremor    GERD (gastroesophageal reflux disease)    High cholesterol    Hypertension    PONV (postoperative nausea and vomiting)    Port-A-Cath in place 01/27/2022   Vertigo    Past Surgical History:  Procedure Laterality Date   BILIARY BRUSHING  09/10/2021   Procedure: BILIARY BRUSHING;  Surgeon: Addiel Mccardle Shipper, MD;  Location: [facility] ENDOSCOPY;  Service: Gastroenterology;;   BILIARY BRUSHING  10/29/2021   Procedure: BILIARY BRUSHING;  Surgeon: Irving Copas., MD;  Location: [facility] ENDOSCOPY;  Service: Gastroenterology;;   BILIARY DILATION  10/29/2021   Procedure: BILIARY DILATION;  Surgeon: Irving Copas., MD;  Location: [facility] ENDOSCOPY;  Service: Gastroenterology;;   BILIARY STENT PLACEMENT  09/10/2021   Procedure: BILIARY STENT PLACEMENT;  Surgeon: Kassidi Elza Shipper, MD;  Location: [facility] ENDOSCOPY;  Service: Gastroenterology;;   BILIARY STENT PLACEMENT N/A 10/03/2021   Procedure: BILIARY STENT PLACEMENT;  Surgeon: Gatha Mayer, MD;  Location: [facility] ENDOSCOPY;  Service: Gastroenterology;  Laterality: N/A;   BILIARY STENT PLACEMENT N/A 10/29/2021   Procedure: BILIARY STENT PLACEMENT;  Surgeon: Rush Landmark Telford Nab., MD;  Location: [facility]  ENDOSCOPY;  Service: Gastroenterology;  Laterality: N/A;   BIOPSY  10/29/2021   Procedure: BIOPSY;  Surgeon: Rush Landmark Telford Nab., MD;  Location: [facility] ENDOSCOPY;  Service: Gastroenterology;;   ENDOSCOPIC RETROGRADE CHOLANGIOPANCREATOGRAPHY (ERCP) WITH PROPOFOL  N/A 10/29/2021   Procedure: ENDOSCOPIC RETROGRADE CHOLANGIOPANCREATOGRAPHY (ERCP) WITH PROPOFOL;  Surgeon: Irving Copas., MD;  Location: [facility] ENDOSCOPY;  Service: Gastroenterology;  Laterality: N/A;   ERCP N/A 09/10/2021   Procedure: ENDOSCOPIC RETROGRADE CHOLANGIOPANCREATOGRAPHY (ERCP);  Surgeon: Archibald Marchetta Shipper, MD;  Location: [facility];  Service: Gastroenterology;  Laterality: N/A;   ERCP N/A 10/03/2021   Procedure: ENDOSCOPIC RETROGRADE CHOLANGIOPANCREATOGRAPHY (ERCP);  Surgeon: Gatha Mayer, MD;  Location: [facility] ENDOSCOPY;  Service: Gastroenterology;  Laterality: N/A;   ESOPHAGOGASTRODUODENOSCOPY (EGD) WITH PROPOFOL N/A 10/29/2021   Procedure: ESOPHAGOGASTRODUODENOSCOPY (EGD) WITH PROPOFOL;  Surgeon: Rush Landmark Telford Nab., MD;  Location: [facility] ENDOSCOPY;  Service: Gastroenterology;  Laterality: N/A;   EUS N/A 10/29/2021   Procedure: UPPER ENDOSCOPIC ULTRASOUND (EUS) RADIAL;  Surgeon: Irving Copas., MD;  Location: [facility] ENDOSCOPY;  Service: Gastroenterology;  Laterality: N/A;   FINE NEEDLE ASPIRATION  10/29/2021   Procedure: FINE NEEDLE ASPIRATION (FNA) LINEAR;  Surgeon: Irving Copas., MD;  Location: [facility] ENDOSCOPY;  Service: Gastroenterology;;   IR IMAGING GUIDED PORT INSERTION  01/28/2022   PACEMAKER IMPLANT     REMOVAL OF STONES  10/29/2021   Procedure: REMOVAL OF SLUDGE;  Surgeon: Irving Copas., MD;  Location: [facility] ENDOSCOPY;  Service: Gastroenterology;;   Joan Mayans  09/10/2021   Procedure: Joan Mayans;  Surgeon: Jewell Haught Shipper, MD;  Location: [facility]COPY;  Service: Gastroenterology;;   Bess Kinds CHOLANGIOSCOPY N/A 10/29/2021   Procedure: [id number] CHOLANGIOSCOPY;  Surgeon: Irving Copas., MD;  Location: [facility] ENDOSCOPY;  Service: Gastroenterology;  Laterality: N/A;   STENT REMOVAL  10/03/2021   Procedure: STENT REMOVAL;  Surgeon: Gatha Mayer, MD;  Location: [facility] ENDOSCOPY;  Service: Gastroenterology;;    SOCIAL HISTORY:  Social History    Socioeconomic History   Marital status: Married    Spouse name: Not on file   Number of children: Not on file   Years of education: Not on file   Highest education level: Not on file  Occupational History   Not on file  Tobacco Use   Smoking status: Never   Smokeless tobacco: Never  Vaping Use   Vaping Use: Never used  Substance and Sexual Activity   Alcohol use: Never   Drug use: Never   Sexual activity: Not on file  Other Topics Concern   Not on file  Social History Narrative   Not on file   Social Determinants of Health   Financial Resource Strain: Not on file  Food Insecurity: Not on file  Transportation Needs: Not on file  Physical Activity: Not on file  Stress: Not on file  Social Connections: Not on file  Intimate Partner Violence: Not on file    FAMILY HISTORY:  Family History  Problem Relation Age of Onset   Stomach cancer Neg Hx    Esophageal cancer Neg Hx    Colon cancer Neg Hx    Pancreatic cancer Neg Hx     CURRENT MEDICATIONS:  Current Outpatient Medications  Medication Sig Dispense Refill   amLODipine (NORVASC) 2.5 MG tablet Take 2.5 mg by mouth at bedtime.     aspirin EC 81 MG tablet Take 81 mg by mouth daily. Swallow whole.     Cholecalciferol (VITAMIN D3) 50 MCG (2000 UT) TABS Take 2,000 Units by mouth daily after supper.  Coenzyme Q10 (CO Q-10) 100 MG CAPS Take 100 mg by mouth every evening.     famotidine (PEPCID) 40 MG tablet Take 40 mg by mouth every evening.     gabapentin (NEURONTIN) 100 MG capsule Take 100 mg by mouth See admin instructions. Take 100 mg twice daily, may take a third 100 mg dose as needed for pain     GEMCITABINE HCL IV Inject into the vein every 14 (fourteen) days.     lidocaine-prilocaine (EMLA) cream Apply a small amount to port a cath site (do not rub in) and cover with plastic wrap 1 hour prior to infusion appointments 30 g 3   meclizine (ANTIVERT) 25 MG tablet Take 25 mg by mouth daily as needed for dizziness.      Misc Natural Products (JOINT SUPPORT) CAPS Take 1 capsule by mouth daily with breakfast.     Multiple Vitamin (MULTIVITAMIN) tablet Take 1 tablet by mouth daily with breakfast.     Multiple Vitamins-Minerals (OCUVITE EYE HEALTH FORMULA) CAPS Take 1 capsule by mouth daily.     nebivolol (BYSTOLIC) 5 MG tablet Take 2.5 mg by mouth in the morning.     omeprazole (PRILOSEC) 40 MG capsule Take 40 mg by mouth daily before breakfast.     ondansetron (ZOFRAN-ODT) 4 MG disintegrating tablet Take 4 mg by mouth every 8 (eight) hours as needed for nausea or vomiting.     PACLitaxel-protein bound in Empty Containers Flexible 1 each Inject into the vein every 14 (fourteen) days.     polyethylene glycol (MIRALAX) 17 g packet Take 17 g by mouth daily as needed. (Patient taking differently: Take 17 g by mouth daily as needed for moderate constipation.) 14 each 0   Polyvinyl Alcohol-Povidone (REFRESH OP) Place 1 drop into both eyes daily as needed (tired eyes).     primidone (MYSOLINE) 50 MG tablet Take 25 mg by mouth daily as needed (for tremors).     prochlorperazine (COMPAZINE) 10 MG tablet Take 1 tablet (10 mg total) by mouth every 6 (six) hours as needed for nausea or vomiting. 60 tablet 6   pyridOXINE (VITAMIN B-6) 100 MG tablet Take 50 mg by mouth in the morning.     tiZANidine (ZANAFLEX) 2 MG tablet Take 1 mg by mouth daily as needed for muscle spasms.     No current facility-administered medications for this visit.   Facility-Administered Medications Ordered in Other Visits  Medication Dose Route Frequency Provider Last Rate Last Admin   gemcitabine (GEMZAR) 760 mg in sodium chloride 0.9 % 250 mL chemo infusion  500 mg/m2 (Treatment Plan Recorded) Intravenous Once Derek Jack, MD       heparin lock flush 100 unit/mL  500 Units Intracatheter Once PRN Derek Jack, MD       PACLitaxel-protein bound (ABRAXANE) chemo infusion 150 mg  100 mg/m2 (Treatment Plan Recorded) Intravenous Once  Derek Jack, MD       sodium chloride flush (NS) 0.9 % injection 10 mL  10 mL Intracatheter PRN Derek Jack, MD        ALLERGIES:  Allergies  Allergen Reactions   Ivp Dye [Iodinated Contrast Media] Hives   Lisinopril Cough    PHYSICAL EXAM:  Performance status (ECOG): 1 - Symptomatic but completely ambulatory  There were no vitals filed for this visit.  Wt Readings from Last 3 Encounters:  03/19/22 114 lb 12.8 oz (52.1 kg)  03/05/22 113 lb 3.2 oz (51.3 kg)  02/19/22 113 lb (51.3 kg)  Physical Exam Vitals reviewed.  Constitutional:      Appearance: Normal appearance.  Cardiovascular:     Rate and Rhythm: Normal rate and regular rhythm.     Pulses: Normal pulses.     Heart sounds: Normal heart sounds.  Pulmonary:     Effort: Pulmonary effort is normal.     Breath sounds: Normal breath sounds.  Neurological:     General: No focal deficit present.     Mental Status: She is alert and oriented to person, place, and time.  Psychiatric:        Mood and Affect: Mood normal.        Behavior: Behavior normal.      LABORATORY DATA:  I have reviewed the labs as listed.     Latest Ref Rng & Units 03/19/2022   10:44 AM 03/05/2022    8:47 AM 02/19/2022    9:22 AM  CBC  WBC 4.0 - 10.5 K/uL 6.2  6.3  6.0   Hemoglobin 12.0 - 15.0 g/dL 9.8  9.7  9.6   Hematocrit 36.0 - 46.0 % 30.7  30.2  30.3   Platelets 150 - 400 K/uL 232  180  345       Latest Ref Rng & Units 03/19/2022   10:44 AM 03/05/2022    8:47 AM 02/19/2022    9:22 AM  CMP  Glucose 70 - 99 mg/dL 115  110  123   BUN 8 - 23 mg/dL '25  27  27   '$ Creatinine 0.44 - 1.00 mg/dL 1.04  1.27  1.26   Sodium 135 - 145 mmol/L 138  139  134   Potassium 3.5 - 5.1 mmol/L 4.1  4.2  4.5   Chloride 98 - 111 mmol/L 106  107  105   CO2 22 - 32 mmol/L '26  24  24   '$ Calcium 8.9 - 10.3 mg/dL 9.5  9.5  9.7   Total Protein 6.5 - 8.1 g/dL 6.5  6.4  6.3   Total Bilirubin 0.3 - 1.2 mg/dL 0.5  0.5  0.5   Alkaline Phos 38 - 126  U/L 265  191  236   AST 15 - 41 U/L 87  31  29   ALT 0 - 44 U/L 72  35  40     DIAGNOSTIC IMAGING:  I have independently reviewed the scans and discussed with the patient. No results found.   ASSESSMENT:  T2 N0 M1 pancreatic adenocarcinoma: - She presented with painless jaundice in April 2023.  Weight loss of 13 pounds since April. - Stent placement on 09/10/2021 for a bilirubin of 8.2. - 10/03/2021: ERCP and stent removal and stent placement for stent dysfunction with total bilirubin 16.3. - 10/29/2021: ERCP/EUS: Lower CBD stent stenosis from stricture, status post metal stent placement.  EUS showed mass in the superior pancreatic head measuring 2.8 x 2.2 cm, suggesting abutment of the portal vein.  Intact interface was seen between lesion and SMA and celiac trunk suggesting lack of invasion.  Hypoechoic 14 x 12 mm lesion visualized in the liver.  Biopsy could not be attempted due to presence of blood vessel. - She had syncopal episodes in January, had pacemaker placement for bradycardia. - Pathology: Pancreatic head FNA and biliary stricture brushing: Malignant cells consistent with adenocarcinoma. - CA 19-9: 131 - We reviewed MRI of the abdomen with and without contrast (12/24/2021): Pancreatic head mass measures 2.7 x 2.3 cm (2.5 x 1.8 cm previously).  Mass is abutting and  slightly displacing the SMA.  No obvious fat plane is demonstrated.  Portal and splenic veins are patent.  Stable small scattered hepatic cysts with no liver metastatic disease. - PET scan (11/20/2021): Ill-defined low-level hypermetabolic activity about the metallic CBD stent and pancreatic uncinate process without discrete CT correlate.  No convincing evidence of hypermetabolic disease in the neck, chest, abdomen or pelvis.  Tiny bilateral lung nodules measuring up to 3 mm.  -CT pancreatic protocol (01/16/2022): Hypoenhancing mass in the pancreatic head measuring 2.9 x 1.8 cm.  Slightly greater than 180 degrees of abutment with  portal vein as well as SMA without definite vascular encasement.  New enhancing 9 mm soft tissue nodule along the posterior aspect of the gallbladder wall?  Suspicious for peritoneal implant. -I have discussed with Dr. Zenia Resides who has seen this patient. -Dr. Zenia Resides thought that this patient might not be a candidate for surgery but she would like to evaluate after 3 to 4 months of neoadjuvant chemotherapy. -We talked about neoadjuvant chemotherapy.  She is not a candidate for FOLFIRINOX.  I have recommended gemcitabine and Abraxane every other week.  Cycle 1 was started on 02/05/2022.  Social/family history: - She lives at home with her husband who has dementia.  She worked as a Char[employer] fundraiser for 33 years.  Non-smoker and no exposure to chemicals.  She has not been driving since June 28, 2022. - Daughter died of glioblastoma.  Mother had ovarian cancer.  Maternal aunt had breast cancer.   PLAN:  1.T2 N0 M0 pancreatic adenocarcinoma: - Due for cycle 2, day 15 of gemcitabine and abraxane today --Labs from today were reviewed and adequate for treatment. WBC 6.2, Hgb 9.8, Plt 232. --Improving creatinine level and mild elevation of liver enzymes. Creatinine 1.04, AST 87, ALT 72. Tbili normal.  --Proceed with treatment today without any dose modifications.  --RTC in 2 weeks with labs, f/u visit with Dr. Worthy Keeler before cycle 3, day 1.   2.  Weight loss: --Stable appetite and continued improvement of weight.   3. Constipation:  --Currently taking Senokot 1 tablet per day. Advised to increase to 2 tablets per day.    Orders placed this encounter:  No orders of the defined types were placed in this encounter.   I have spent a total of 30 minutes minutes of face-to-face and non-face-to-face time, preparing to see the patient, performing a medically appropriate examination, counseling and educating the patient, documenting clinical information in the electronic health record, and care coordination.   Dede Query PA-C Dept of Hematology and [place]  Phone: [phone]

## 2022-03-19 NOTE — Progress Notes (Signed)
Pt presents today for Abraxane and Gemzar per provider's order. Vital signs and labs WNL for treatment. Okay to proceed with treatment today per I.[id number]-C.  Abraxane and Gemzar given today per MD orders. Tolerated infusion without adverse affects. Vital signs stable. No complaints at this time. Discharged from clinic ambulatory in stable condition. Alert and oriented x 3. F/U with [facility] as scheduled.

## 2022-03-19 NOTE — Patient Instructions (Signed)
[facility]  Discharge Instructions: Thank you for choosing [facility] to provide your oncology and hematology care.  If you have a lab appointment with the [facility], please come in thru the Main Entrance and check in at the main information desk.  Wear comfortable clothing and clothing appropriate for easy access to any Portacath or PICC line.   We strive to give you quality time with your provider. You may need to reschedule your appointment if you arrive late (15 or more minutes).  Arriving late affects you and other patients whose appointments are after yours.  Also, if you miss three or more appointments without notifying the office, you may be dismissed from the clinic at the provider's discretion.      For prescription refill requests, have your pharmacy contact our office and allow 72 hours for refills to be completed.    Today you received the following chemotherapy and/or immunotherapy agents Abraxane and Gemzar   To help prevent nausea and vomiting after your treatment, we encourage you to take your nausea medication as directed.  Paclitaxel Nanoparticle Albumin-Bound Injection What is this medication? NANOPARTICLE ALBUMIN-BOUND PACLITAXEL (Na no PAHR ti kuhl al BYOO muhn-bound PAK li TAX el) treats some types of cancer. It works by slowing down the growth of cancer cells. This medicine may be used for other purposes; ask your health care provider or pharmacist if you have questions. COMMON BRAND NAME(S): Abraxane What should I tell my care team before I take this medication? They need to know if you have any of these conditions: Liver disease Low white blood cell levels An unusual or allergic reaction to paclitaxel, albumin, other medications, foods, dyes, or preservatives If you or your partner are pregnant or trying to get pregnant Breast-feeding How should I use this medication? This medication is injected into a vein. It is given by  your care team in a hospital or clinic setting. Talk to your care team about the use of this medication in children. Special care may be needed. Overdosage: If you think you have taken too much of this medicine contact a poison control center or emergency room at once. NOTE: This medicine is only for you. Do not share this medicine with others. What if I miss a dose? Keep appointments for follow-up doses. It is important not to miss your dose. Call your care team if you are unable to keep an appointment. What may interact with this medication? Other medications may affect the way this medication works. Talk with your care team about all of the medications you take. They may suggest changes to your treatment plan to lower the risk of side effects and to make sure your medications work as intended. This list may not describe all possible interactions. Give your health care provider a list of all the medicines, herbs, non-prescription drugs, or dietary supplements you use. Also tell them if you smoke, drink alcohol, or use illegal drugs. Some items may interact with your medicine. What should I watch for while using this medication? Your condition will be monitored carefully while you are receiving this medication. You may need blood work while taking this medication. This medication may make you feel generally unwell. This is not uncommon as chemotherapy can affect healthy cells as well as cancer cells. Report any side effects. Continue your course of treatment even though you feel ill unless your care team tells you to stop. This medication can cause serious allergic reactions. To  reduce the risk, your care team may give you other medications to take before receiving this one. Be sure to follow the directions from your care team. This medication may increase your risk of getting an infection. Call your care team for advice if you get a fever, chills, sore throat, or other symptoms of a cold or flu. Do  not treat yourself. Try to avoid being around people who are sick. This medication may increase your risk to bruise or bleed. Call your care team if you notice any unusual bleeding. Be careful brushing or flossing your teeth or using a toothpick because you may get an infection or bleed more easily. If you have any dental work done, tell your dentist you are receiving this medication. Talk to your care team if you or your partner may be pregnant. Serious birth defects can occur if you take this medication during pregnancy and for 6 months after the last dose. You will need a negative pregnancy test before starting this medication. Contraception is recommended while taking this medication and for 6 months after the last dose. Your care team can help you find the option that works for you. If your partner can get pregnant, use a condom during sex while taking this medication and for 3 months after the last dose. Do not breastfeed while taking this medication and for 2 weeks after the last dose. This medication may cause infertility. Talk to your care team if you are concerned about your fertility. What side effects may I notice from receiving this medication? Side effects that you should report to your care team as soon as possible: Allergic reactions--skin rash, itching, hives, swelling of the face, lips, tongue, or throat Dry cough, shortness of breath or trouble breathing Infection--fever, chills, cough, sore throat, wounds that don't heal, pain or trouble when passing urine, general feeling of discomfort or being unwell Low red blood cell level--unusual weakness or fatigue, dizziness, headache, trouble breathing Pain, tingling, or numbness in the hands or feet Stomach pain, unusual weakness or fatigue, nausea, vomiting, diarrhea, or fever that lasts longer than expected Unusual bruising or bleeding Side effects that usually do not require medical attention (report to your care team if they continue  or are bothersome): Diarrhea Fatigue Hair loss Loss of appetite Nausea Vomiting This list may not describe all possible side effects. Call your doctor for medical advice about side effects. You may report side effects to FDA at 1-800-FDA-1088. Where should I keep my medication? This medication is given in a hospital or clinic. It will not be stored at home. NOTE: This sheet is a summary. It may not cover all possible information. If you have questions about this medicine, talk to your doctor, pharmacist, or health care provider.  2023 Elsevier/Gold Standard (2021-09-17 00:00:00)  Gemcitabine Injection What is this medication? GEMCITABINE (jem SYE ta been) treats some types of cancer. It works by slowing down the growth of cancer cells. This medicine may be used for other purposes; ask your health care provider or pharmacist if you have questions. COMMON BRAND NAME(S): Gemzar, Infugem What should I tell my care team before I take this medication? They need to know if you have any of these conditions: Blood disorders Infection Kidney disease Liver disease Lung or breathing disease, such as asthma or COPD Recent or ongoing radiation therapy An unusual or allergic reaction to gemcitabine, other medications, foods, dyes, or preservatives If you or your partner are pregnant or trying to get pregnant Breast-feeding How  should I use this medication? This medication is injected into a vein. It is given by your care team in a hospital or clinic setting. Talk to your care team about the use of this medication in children. Special care may be needed. Overdosage: If you think you have taken too much of this medicine contact a poison control center or emergency room at once. NOTE: This medicine is only for you. Do not share this medicine with others. What if I miss a dose? Keep appointments for follow-up doses. It is important not to miss your dose. Call your care team if you are unable to keep  an appointment. What may interact with this medication? Interactions have not been studied. This list may not describe all possible interactions. Give your health care provider a list of all the medicines, herbs, non-prescription drugs, or dietary supplements you use. Also tell them if you smoke, drink alcohol, or use illegal drugs. Some items may interact with your medicine. What should I watch for while using this medication? Your condition will be monitored carefully while you are receiving this medication. This medication may make you feel generally unwell. This is not uncommon, as chemotherapy can affect healthy cells as well as cancer cells. Report any side effects. Continue your course of treatment even though you feel ill unless your care team tells you to stop. In some cases, you may be given additional medications to help with side effects. Follow all directions for their use. This medication may increase your risk of getting an infection. Call your care team for advice if you get a fever, chills, sore throat, or other symptoms of a cold or flu. Do not treat yourself. Try to avoid being around people who are sick. This medication may increase your risk to bruise or bleed. Call your care team if you notice any unusual bleeding. Be careful brushing or flossing your teeth or using a toothpick because you may get an infection or bleed more easily. If you have any dental work done, tell your dentist you are receiving this medication. Avoid taking medications that contain aspirin, acetaminophen, ibuprofen, naproxen, or ketoprofen unless instructed by your care team. These medications may hide a fever. Talk to your care team if you or your partner wish to become pregnant or think you might be pregnant. This medication can cause serious birth defects if taken during pregnancy and for 6 months after the last dose. A negative pregnancy test is required before starting this medication. A reliable form of  contraception is recommended while taking this medication and for 6 months after the last dose. Talk to your care team about effective forms of contraception. Do not father a child while taking this medication and for 3 months after the last dose. Use a condom while having sex during this time period. Do not breastfeed while taking this medication and for at least 1 week after the last dose. This medication may cause infertility. Talk to your care team if you are concerned about your fertility. What side effects may I notice from receiving this medication? Side effects that you should report to your care team as soon as possible: Allergic reactions--skin rash, itching, hives, swelling of the face, lips, tongue, or throat Capillary leak syndrome--stomach or muscle pain, unusual weakness or fatigue, feeling faint or lightheaded, decrease in the amount of urine, swelling of the ankles, hands, or feet, trouble breathing Infection--fever, chills, cough, sore throat, wounds that don't heal, pain or trouble when passing urine, general  feeling of discomfort or being unwell Liver injury--right upper belly pain, loss of appetite, nausea, light-colored stool, dark yellow or brown urine, yellowing skin or eyes, unusual weakness or fatigue Low red blood cell level--unusual weakness or fatigue, dizziness, headache, trouble breathing Lung injury--shortness of breath or trouble breathing, cough, spitting up blood, chest pain, fever Stomach pain, bloody diarrhea, pale skin, unusual weakness or fatigue, decrease in the amount of urine, which may be signs of hemolytic uremic syndrome Sudden and severe headache, confusion, change in vision, seizures, which may be signs of posterior reversible encephalopathy syndrome (PRES) Unusual bruising or bleeding Side effects that usually do not require medical attention (report to your care team if they continue or are bothersome): Diarrhea Drowsiness Hair loss Nausea Pain,  redness, or swelling with sores inside the mouth or throat Vomiting This list may not describe all possible side effects. Call your doctor for medical advice about side effects. You may report side effects to FDA at 1-800-FDA-1088. Where should I keep my medication? This medication is given in a hospital or clinic. It will not be stored at home. NOTE: This sheet is a summary. It may not cover all possible information. If you have questions about this medicine, talk to your doctor, pharmacist, or health care provider.  2023 Elsevier/Gold Standard (2021-09-17 00:00:00)   BELOW ARE SYMPTOMS THAT SHOULD BE REPORTED IMMEDIATELY: *FEVER GREATER THAN 100.4 F (38 C) OR HIGHER *CHILLS OR SWEATING *NAUSEA AND VOMITING THAT IS NOT CONTROLLED WITH YOUR NAUSEA MEDICATION *UNUSUAL SHORTNESS OF BREATH *UNUSUAL BRUISING OR BLEEDING *URINARY PROBLEMS (pain or burning when urinating, or frequent urination) *BOWEL PROBLEMS (unusual diarrhea, constipation, pain near the anus) TENDERNESS IN MOUTH AND THROAT WITH OR WITHOUT PRESENCE OF ULCERS (sore throat, sores in mouth, or a toothache) UNUSUAL RASH, SWELLING OR PAIN  UNUSUAL VAGINAL DISCHARGE OR ITCHING   Items with * indicate a potential emergency and should be followed up as soon as possible or go to the Emergency Department if any problems should occur.  Please show the CHEMOTHERAPY ALERT CARD or IMMUNOTHERAPY ALERT CARD at check-in to the Emergency Department and triage nurse.  Should you have questions after your visit or need to cancel or reschedule your appointment, please contact [facility] [phone]  and follow the prompts.  Office hours are 8:00 a.m. to 4:30 p.m. Monday - Friday. Please note that voicemails left after 4:00 p.m. may not be returned until the following business day.  We are closed weekends and major holidays. You have access to a nurse at all times for urgent questions. Please call the main number to the  clinic [phone] and follow the prompts.  For any non-urgent questions, you may also contact your provider using MyChart. We now offer e-Visits for anyone 76 and older to request care online for non-urgent symptoms. For details visit [email or web address].   Also download the MyChart app! Go to the app store, search "MyChart", open the app, select , and log in with your MyChart username and password.  Masks are optional in the cancer centers. If you would like for your care team to wear a mask while they are taking care of you, please let them know. You may have one support person who is at least 85 years old accompany you for your appointments.

## 2022-03-25 ENCOUNTER — Other Ambulatory Visit: Payer: Self-pay

## 2022-03-29 ENCOUNTER — Other Ambulatory Visit: Payer: Self-pay

## 2022-04-02 ENCOUNTER — Inpatient Hospital Stay (HOSPITAL_BASED_OUTPATIENT_CLINIC_OR_DEPARTMENT_OTHER): Payer: Medicare HMO | Admitting: Hematology

## 2022-04-02 ENCOUNTER — Inpatient Hospital Stay: Payer: Medicare HMO | Attending: Hematology

## 2022-04-02 ENCOUNTER — Inpatient Hospital Stay: Payer: Medicare HMO

## 2022-04-02 DIAGNOSIS — C250 Malignant neoplasm of head of pancreas: Secondary | ICD-10-CM | POA: Insufficient documentation

## 2022-04-02 DIAGNOSIS — C259 Malignant neoplasm of pancreas, unspecified: Secondary | ICD-10-CM

## 2022-04-02 DIAGNOSIS — Z803 Family history of malignant neoplasm of breast: Secondary | ICD-10-CM | POA: Diagnosis not present

## 2022-04-02 DIAGNOSIS — I10 Essential (primary) hypertension: Secondary | ICD-10-CM | POA: Diagnosis not present

## 2022-04-02 DIAGNOSIS — Z95828 Presence of other vascular implants and grafts: Secondary | ICD-10-CM | POA: Diagnosis not present

## 2022-04-02 DIAGNOSIS — R634 Abnormal weight loss: Secondary | ICD-10-CM | POA: Diagnosis not present

## 2022-04-02 LAB — CBC WITH DIFFERENTIAL/PLATELET
Abs Immature Granulocytes: 0.02 10*3/uL (ref 0.00–0.07)
Basophils Absolute: 0.1 10*3/uL (ref 0.0–0.1)
Basophils Relative: 1 %
Eosinophils Absolute: 0.1 10*3/uL (ref 0.0–0.5)
Eosinophils Relative: 2 %
HCT: 28.9 % — ABNORMAL LOW (ref 36.0–46.0)
Hemoglobin: 9.4 g/dL — ABNORMAL LOW (ref 12.0–15.0)
Immature Granulocytes: 0 %
Lymphocytes Relative: 19 %
Lymphs Abs: 1.2 10*3/uL (ref 0.7–4.0)
MCH: 29.7 pg (ref 26.0–34.0)
MCHC: 32.5 g/dL (ref 30.0–36.0)
MCV: 91.2 fL (ref 80.0–100.0)
Monocytes Absolute: 0.9 10*3/uL (ref 0.1–1.0)
Monocytes Relative: 15 %
Neutro Abs: 4 10*3/uL (ref 1.7–7.7)
Neutrophils Relative %: 63 %
Platelets: 211 10*3/uL (ref 150–400)
RBC: 3.17 MIL/uL — ABNORMAL LOW (ref 3.87–5.11)
RDW: 15.9 % — ABNORMAL HIGH (ref 11.5–15.5)
WBC: 6.2 10*3/uL (ref 4.0–10.5)
nRBC: 0 % (ref 0.0–0.2)

## 2022-04-02 LAB — COMPREHENSIVE METABOLIC PANEL
ALT: 229 U/L — ABNORMAL HIGH (ref 0–44)
AST: 160 U/L — ABNORMAL HIGH (ref 15–41)
Albumin: 3.4 g/dL — ABNORMAL LOW (ref 3.5–5.0)
Alkaline Phosphatase: 427 U/L — ABNORMAL HIGH (ref 38–126)
Anion gap: 9 (ref 5–15)
BUN: 33 mg/dL — ABNORMAL HIGH (ref 8–23)
CO2: 23 mmol/L (ref 22–32)
Calcium: 9.4 mg/dL (ref 8.9–10.3)
Chloride: 105 mmol/L (ref 98–111)
Creatinine, Ser: 1.41 mg/dL — ABNORMAL HIGH (ref 0.44–1.00)
GFR, Estimated: 37 mL/min — ABNORMAL LOW (ref >60)
Glucose, Bld: 139 mg/dL — ABNORMAL HIGH (ref 70–99)
Potassium: 4 mmol/L (ref 3.5–5.1)
Sodium: 137 mmol/L (ref 135–145)
Total Bilirubin: 1 mg/dL (ref 0.3–1.2)
Total Protein: 6.5 g/dL (ref 6.5–8.1)

## 2022-04-02 LAB — MAGNESIUM: Magnesium: 2.3 mg/dL (ref 1.7–2.4)

## 2022-04-02 MED ORDER — HEPARIN SOD (PORK) LOCK FLUSH 100 UNIT/ML IV SOLN
500.0000 [IU] | Freq: Once | INTRAVENOUS | Status: AC
  Administered 2022-04-02: 500 [IU] via INTRAVENOUS

## 2022-04-02 MED ORDER — SODIUM CHLORIDE 0.9% FLUSH
10.0000 mL | INTRAVENOUS | Status: DC | PRN
  Administered 2022-04-02: 10 mL

## 2022-04-02 MED ORDER — SODIUM CHLORIDE 0.9 % IV SOLN: Freq: Once | INTRAVENOUS | Status: AC

## 2022-04-02 NOTE — Progress Notes (Signed)
Pt presents today for treatment today. No treatment today, pt will receive 500 mL of normal saline over 1 hour per Dr.K.  500 mL of normal saline over 1 hour given today per MD orders. Tolerated infusion without adverse affects. Vital signs stable. No complaints at this time. Discharged from clinic ambulatory in stable condition. Alert and oriented x 3. F/U with [facility] as scheduled.

## 2022-04-02 NOTE — Progress Notes (Signed)
[place], [place] [id number]   CLINIC:  Medical Oncology/Hematology  PCP:  Abigail Miyamoto, [place] / [place] [id number] [phone]   REASON FOR VISIT:  Follow-up for pancreatic adenocarcinoma  PRIOR THERAPY: none  NGS Results: not done  CURRENT THERAPY: under work-up  BRIEF ONCOLOGIC HISTORY:  Oncology History  Pancreatic carcinoma ([place])  11/11/2021 Initial Diagnosis   Pancreatic carcinoma ([place])   02/05/2022 -  Chemotherapy   Patient is on Treatment Plan : PANCREATIC Abraxane D1,8,15 + Gemcitabine D1,8,15 q28d       CANCER STAGING:  Cancer Staging  Pancreatic carcinoma ([place]) Staging form: Exocrine Pancreas, AJCC 8th Edition - Clinical stage from 11/11/2021: Stage IB (cT2, cN0, cM0) - Unsigned   INTERVAL HISTORY:  [name], a 87 y.o. female, seen for follow-up and toxicity assessment prior to cycle 3 of chemotherapy.  Cycle 2-day 15 was on 03/19/2022.  She has reported energy levels of 60%.  She has not felt well after cycle 2-day 15 treatment in the last 2 weeks.  She lost about 5 pounds.  She reports that her husband was getting weaker and is being placed on hospice.  She had to attend to him a lot in the 2 weeks.  REVIEW OF SYSTEMS:  Review of Systems  Constitutional:  Negative for appetite change, fatigue and unexpected weight change.  Gastrointestinal:  Positive for constipation. Negative for abdominal pain.  Neurological:  Positive for dizziness and numbness (Feet).  All other systems reviewed and are negative.   PAST MEDICAL/SURGICAL HISTORY:  Past Medical History:  Diagnosis Date   Essential tremor    GERD (gastroesophageal reflux disease)    High cholesterol    Hypertension    PONV (postoperative nausea and vomiting)    Port-A-Cath in place 01/27/2022   Vertigo    Past Surgical History:  Procedure Laterality Date   BILIARY BRUSHING  09/10/2021   Procedure: BILIARY BRUSHING;   Surgeon: Irene Shipper, MD;  Location: [facility] ENDOSCOPY;  Service: Gastroenterology;;   BILIARY BRUSHING  10/29/2021   Procedure: BILIARY BRUSHING;  Surgeon: Irving Copas., MD;  Location: [facility] ENDOSCOPY;  Service: Gastroenterology;;   BILIARY DILATION  10/29/2021   Procedure: BILIARY DILATION;  Surgeon: Irving Copas., MD;  Location: [facility] ENDOSCOPY;  Service: Gastroenterology;;   BILIARY STENT PLACEMENT  09/10/2021   Procedure: BILIARY STENT PLACEMENT;  Surgeon: Irene Shipper, MD;  Location: [facility] ENDOSCOPY;  Service: Gastroenterology;;   BILIARY STENT PLACEMENT N/A 10/03/2021   Procedure: BILIARY STENT PLACEMENT;  Surgeon: Gatha Mayer, MD;  Location: [facility] ENDOSCOPY;  Service: Gastroenterology;  Laterality: N/A;   BILIARY STENT PLACEMENT N/A 10/29/2021   Procedure: BILIARY STENT PLACEMENT;  Surgeon: Rush Landmark Telford Nab., MD;  Location: [facility] ENDOSCOPY;  Service: Gastroenterology;  Laterality: N/A;   BIOPSY  10/29/2021   Procedure: BIOPSY;  Surgeon: Rush Landmark Telford Nab., MD;  Location: [facility] ENDOSCOPY;  Service: Gastroenterology;;   ENDOSCOPIC RETROGRADE CHOLANGIOPANCREATOGRAPHY (ERCP) WITH PROPOFOL N/A 10/29/2021   Procedure: ENDOSCOPIC RETROGRADE CHOLANGIOPANCREATOGRAPHY (ERCP) WITH PROPOFOL;  Surgeon: Irving Copas., MD;  Location: [facility] ENDOSCOPY;  Service: Gastroenterology;  Laterality: N/A;   ERCP N/A 09/10/2021   Procedure: ENDOSCOPIC RETROGRADE CHOLANGIOPANCREATOGRAPHY (ERCP);  Surgeon: Irene Shipper, MD;  Location: [place];  Service: Gastroenterology;  Laterality: N/A;   ERCP N/A 10/03/2021   Procedure: ENDOSCOPIC RETROGRADE CHOLANGIOPANCREATOGRAPHY (ERCP);  Surgeon: Gatha Mayer, MD;  Location: [facility] ENDOSCOPY;  Service: Gastroenterology;  Laterality: N/A;   ESOPHAGOGASTRODUODENOSCOPY (  EGD) WITH PROPOFOL N/A 10/29/2021   Procedure: ESOPHAGOGASTRODUODENOSCOPY (EGD) WITH PROPOFOL;  Surgeon: Rush Landmark Telford Nab., MD;  Location: [facility] ENDOSCOPY;  Service: Gastroenterology;   Laterality: N/A;   EUS N/A 10/29/2021   Procedure: UPPER ENDOSCOPIC ULTRASOUND (EUS) RADIAL;  Surgeon: Irving Copas., MD;  Location: [facility] ENDOSCOPY;  Service: Gastroenterology;  Laterality: N/A;   FINE NEEDLE ASPIRATION  10/29/2021   Procedure: FINE NEEDLE ASPIRATION (FNA) LINEAR;  Surgeon: Irving Copas., MD;  Location: [facility] ENDOSCOPY;  Service: Gastroenterology;;   IR IMAGING GUIDED PORT INSERTION  01/28/2022   PACEMAKER IMPLANT     REMOVAL OF STONES  10/29/2021   Procedure: REMOVAL OF SLUDGE;  Surgeon: Irving Copas., MD;  Location: [facility] ENDOSCOPY;  Service: Gastroenterology;;   Joan Mayans  09/10/2021   Procedure: Joan Mayans;  Surgeon: Irene Shipper, MD;  Location: [facility] ENDOSCOPY;  Service: Gastroenterology;;   Bess Kinds CHOLANGIOSCOPY N/A 10/29/2021   Procedure: BJSEGBTD CHOLANGIOSCOPY;  Surgeon: Irving Copas., MD;  Location: [facility] ENDOSCOPY;  Service: Gastroenterology;  Laterality: N/A;   STENT REMOVAL  10/03/2021   Procedure: STENT REMOVAL;  Surgeon: Gatha Mayer, MD;  Location: [facility] ENDOSCOPY;  Service: Gastroenterology;;    SOCIAL HISTORY:  Social History   Socioeconomic History   Marital status: Married    Spouse name: Not on file   Number of children: Not on file   Years of education: Not on file   Highest education level: Not on file  Occupational History   Not on file  Tobacco Use   Smoking status: Never   Smokeless tobacco: Never  Vaping Use   Vaping Use: Never used  Substance and Sexual Activity   Alcohol use: Never   Drug use: Never   Sexual activity: Not on file  Other Topics Concern   Not on file  Social History Narrative   Not on file   Social Determinants of Health   Financial Resource Strain: Not on file  Food Insecurity: Not on file  Transportation Needs: Not on file  Physical Activity: Not on file  Stress: Not on file  Social Connections: Not on file  Intimate Partner Violence: Not on file    FAMILY HISTORY:   Family History  Problem Relation Age of Onset   Stomach cancer Neg Hx    Esophageal cancer Neg Hx    Colon cancer Neg Hx    Pancreatic cancer Neg Hx     CURRENT MEDICATIONS:  Current Outpatient Medications  Medication Sig Dispense Refill   amLODipine (NORVASC) 2.5 MG tablet Take 2.5 mg by mouth at bedtime.     aspirin EC 81 MG tablet Take 81 mg by mouth daily. Swallow whole.     Cholecalciferol (VITAMIN D3) 50 MCG (2000 UT) TABS Take 2,000 Units by mouth daily after supper.     Coenzyme Q10 (CO Q-10) 100 MG CAPS Take 100 mg by mouth every evening.     famotidine (PEPCID) 40 MG tablet Take 40 mg by mouth every evening.     gabapentin (NEURONTIN) 100 MG capsule Take 100 mg by mouth See admin instructions. Take 100 mg twice daily, may take a third 100 mg dose as needed for pain     GEMCITABINE HCL IV Inject into the vein every 14 (fourteen) days.     lidocaine-prilocaine (EMLA) cream Apply a small amount to port a cath site (do not rub in) and cover with plastic wrap 1 hour prior to infusion appointments 30 g 3   meclizine (ANTIVERT) 25 MG tablet  Take 25 mg by mouth daily as needed for dizziness.     Misc Natural Products (JOINT SUPPORT) CAPS Take 1 capsule by mouth daily with breakfast.     Multiple Vitamin (MULTIVITAMIN) tablet Take 1 tablet by mouth daily with breakfast.     Multiple Vitamins-Minerals (OCUVITE EYE HEALTH FORMULA) CAPS Take 1 capsule by mouth daily.     nebivolol (BYSTOLIC) 5 MG tablet Take 2.5 mg by mouth in the morning.     omeprazole (PRILOSEC) 40 MG capsule Take 40 mg by mouth daily before breakfast.     ondansetron (ZOFRAN-ODT) 4 MG disintegrating tablet Take 4 mg by mouth every 8 (eight) hours as needed for nausea or vomiting.     PACLitaxel-protein bound in Empty Containers Flexible 1 each Inject into the vein every 14 (fourteen) days.     polyethylene glycol (MIRALAX) 17 g packet Take 17 g by mouth daily as needed. (Patient taking differently: Take 17 g by mouth  daily as needed for moderate constipation.) 14 each 0   Polyvinyl Alcohol-Povidone (REFRESH OP) Place 1 drop into both eyes daily as needed (tired eyes).     primidone (MYSOLINE) 50 MG tablet Take 25 mg by mouth daily as needed (for tremors).     prochlorperazine (COMPAZINE) 10 MG tablet Take 1 tablet (10 mg total) by mouth every 6 (six) hours as needed for nausea or vomiting. 60 tablet 6   pyridOXINE (VITAMIN B-6) 100 MG tablet Take 50 mg by mouth in the morning.     tiZANidine (ZANAFLEX) 2 MG tablet Take 1 mg by mouth daily as needed for muscle spasms.     No current facility-administered medications for this visit.    ALLERGIES:  Allergies  Allergen Reactions   Ivp Dye [Iodinated Contrast Media] Hives   Lisinopril Cough    PHYSICAL EXAM:  Performance status (ECOG): 1 - Symptomatic but completely ambulatory  There were no vitals filed for this visit.  Wt Readings from Last 3 Encounters:  03/19/22 114 lb 12.8 oz (52.1 kg)  03/05/22 113 lb 3.2 oz (51.3 kg)  02/19/22 113 lb (51.3 kg)   Physical Exam Vitals reviewed.  Constitutional:      Appearance: Normal appearance.  Cardiovascular:     Rate and Rhythm: Normal rate and regular rhythm.     Pulses: Normal pulses.     Heart sounds: Normal heart sounds.  Pulmonary:     Effort: Pulmonary effort is normal.     Breath sounds: Normal breath sounds.  Neurological:     General: No focal deficit present.     Mental Status: She is alert and oriented to person, place, and time.  Psychiatric:        Mood and Affect: Mood normal.        Behavior: Behavior normal.      LABORATORY DATA:  I have reviewed the labs as listed.     Latest Ref Rng & Units 03/19/2022   10:44 AM 03/05/2022    8:47 AM 02/19/2022    9:22 AM  CBC  WBC 4.0 - 10.5 K/uL 6.2  6.3  6.0   Hemoglobin 12.0 - 15.0 g/dL 9.8  9.7  9.6   Hematocrit 36.0 - 46.0 % 30.7  30.2  30.3   Platelets 150 - 400 K/uL 232  180  345       Latest Ref Rng & Units 03/19/2022    10:44 AM 03/05/2022    8:47 AM 02/19/2022    9:22 AM  CMP  Glucose 70 - 99 mg/dL 115  110  123   BUN 8 - 23 mg/dL _0 Creatinine 0.44 - 1.00 mg/dL 1.04  1.27  1.26   Sodium 135 - 145 mmol/L 138  139  134   Potassium 3.5 - 5.1 mmol/L 4.1  4.2  4.5   Chloride 98 - 111 mmol/L 106  107  105   CO2 22 - 32 mmol/L _1 Calcium 8.9 - 10.3 mg/dL 9.5  9.5  9.7   Total Protein 6.5 - 8.1 g/dL 6.5  6.4  6.3   Total Bilirubin 0.3 - 1.2 mg/dL 0.5  0.5  0.5   Alkaline Phos 38 - 126 U/L 265  191  236   AST 15 - 41 U/L 87  31  29   ALT 0 - 44 U/L 72  35  40     DIAGNOSTIC IMAGING:  I have independently reviewed the scans and discussed with the patient. No results found.   ASSESSMENT:  T2 N0 M1 pancreatic adenocarcinoma: - She presented with painless jaundice in April 2023.  Weight loss of 13 pounds since April. - Stent placement on 09/10/2021 for a bilirubin of 8.2. - 10/03/2021: ERCP and stent removal and stent placement for stent dysfunction with total bilirubin 16.3. - 10/29/2021: ERCP/EUS: Lower CBD stent stenosis from stricture, status post metal stent placement.  EUS showed mass in the superior pancreatic head measuring 2.8 x 2.2 cm, suggesting abutment of the portal vein.  Intact interface was seen between lesion and SMA and celiac trunk suggesting lack of invasion.  Hypoechoic 14 x 12 mm lesion visualized in the liver.  Biopsy could not be attempted due to presence of blood vessel. - She had syncopal episodes in January, had pacemaker placement for bradycardia. - Pathology: Pancreatic head FNA and biliary stricture brushing: Malignant cells consistent with adenocarcinoma. - CA 19-9: 131 - We reviewed MRI of the abdomen with and without contrast (12/24/2021): Pancreatic head mass measures 2.7 x 2.3 cm (2.5 x 1.8 cm previously).  Mass is abutting and slightly displacing the SMA.  No obvious fat plane is demonstrated.  Portal and splenic veins are patent.  Stable small scattered hepatic  cysts with no liver metastatic disease. - PET scan (11/20/2021): Ill-defined low-level hypermetabolic activity about the metallic CBD stent and pancreatic uncinate process without discrete CT correlate.  No convincing evidence of hypermetabolic disease in the neck, chest, abdomen or pelvis.  Tiny bilateral lung nodules measuring up to 3 mm.  -CT pancreatic protocol (01/16/2022): Hypoenhancing mass in the pancreatic head measuring 2.9 x 1.8 cm.  Slightly greater than 180 degrees of abutment with portal vein as well as SMA without definite vascular encasement.  New enhancing 9 mm soft tissue nodule along the posterior aspect of the gallbladder wall?  Suspicious for peritoneal implant. -I have discussed with Dr. Zenia Resides who has seen this patient. -Dr. Zenia Resides thought that this patient might not be a candidate for surgery but she would like to evaluate after 3 to 4 months of neoadjuvant chemotherapy. -We talked about neoadjuvant chemotherapy.  She is not a candidate for FOLFIRINOX.  I have recommended gemcitabine and Abraxane every other week.  Cycle 1 was started on 02/05/2022.  Social/family history: - She lives at home with her husband who has dementia.  She worked as a Char[employer] fundraiser for 33 years.  Non-smoker and no exposure to chemicals.  She has not been  driving since January. - Daughter died of glioblastoma.  Mother had ovarian cancer.  Maternal aunt had breast cancer.   PLAN:  T2 N0 M0 pancreatic adenocarcinoma: -Cycle 2-day 15 was on 03/19/2022. - She has felt weak in the last 2 weeks and has lost 5 pounds. - Review of labs show AST of 160 and ALT of 229.  Alk phos 427.  Creatinine was also high at 1.41 with BUN 33.  White count and platelet count was normal. - CA 19-9 is pending. - As she is feeling weak and her LFTs are high, recommend holding cycle 3-day 1 today. - We will give 100 mL of normal saline IV over 1 hour. - I will reevaluate her in 2 weeks with labs and possible treatment.  2.   Weight loss: - She lost about 4 to 5 pounds since last 2 weeks. - I am holding her treatment today.  She was told to eat better and gain her weight back.    Orders placed this encounter:  No orders of the defined types were placed in this encounter.     Derek Jack, MD [place] [phone]

## 2022-04-02 NOTE — Progress Notes (Signed)
Patients port flushed without difficulty.  Good blood return noted with no bruising or swelling noted at site.  Stable during access and blood draw.  Patient to remain accessed for treatment. 

## 2022-04-02 NOTE — Patient Instructions (Signed)
[facility]  Discharge Instructions: Thank you for choosing [facility] to provide your oncology and hematology care.  If you have a lab appointment with the [facility], please come in thru the Main Entrance and check in at the main information desk.  Wear comfortable clothing and clothing appropriate for easy access to any Portacath or PICC line.   We strive to give you quality time with your provider. You may need to reschedule your appointment if you arrive late (15 or more minutes).  Arriving late affects you and other patients whose appointments are after yours.  Also, if you miss three or more appointments without notifying the office, you may be dismissed from the clinic at the provider's discretion.      For prescription refill requests, have your pharmacy contact our office and allow 72 hours for refills to be completed.    Today you received 585m of normal saline over 1 hour     BELOW ARE SYMPTOMS THAT SHOULD BE REPORTED IMMEDIATELY: *FEVER GREATER THAN 100.4 F (38 C) OR HIGHER *CHILLS OR SWEATING *NAUSEA AND VOMITING THAT IS NOT CONTROLLED WITH YOUR NAUSEA MEDICATION *UNUSUAL SHORTNESS OF BREATH *UNUSUAL BRUISING OR BLEEDING *URINARY PROBLEMS (pain or burning when urinating, or frequent urination) *BOWEL PROBLEMS (unusual diarrhea, constipation, pain near the anus) TENDERNESS IN MOUTH AND THROAT WITH OR WITHOUT PRESENCE OF ULCERS (sore throat, sores in mouth, or a toothache) UNUSUAL RASH, SWELLING OR PAIN  UNUSUAL VAGINAL DISCHARGE OR ITCHING   Items with * indicate a potential emergency and should be followed up as soon as possible or go to the Emergency Department if any problems should occur.  Please show the CHEMOTHERAPY ALERT CARD or IMMUNOTHERAPY ALERT CARD at check-in to the Emergency Department and triage nurse.  Should you have questions after your visit or need to cancel or reschedule your appointment, please contact  [phone] and follow the prompts.  Office hours are 8:00 a.m. to 4:30 p.m. Monday - Friday. Please note that voicemails left after 4:00 p.m. may not be returned until the following business day.  We are closed weekends and major holidays. You have access to a nurse at all times for urgent questions. Please call the main number to the clinic [phone] follow the prompts.  For any non-urgent questions, you may also contact your provider using MyChart. We now offer e-Visits for anyone 145and older to request care online for non-urgent symptoms. For details visit [email or web address]   Also download the MyChart app! Go to the app store, search "MyChart", open the app, select [facility], and log in with your MyChart username and password.  Masks are optional in the cancer centers. If you would like for your care team to wear a mask while they are taking care of you, please let them know. You may have one support person who is at least 85years old accompany you for your appointments.

## 2022-04-02 NOTE — Patient Instructions (Signed)
[place] at [facility] Discharge Instructions   You were seen and examined today by Dr. Delton Coombes.  He reviewed the results of your lab work. Your kidney number is slightly elevated. You liver enzymes are also elevated.   You have also lost 4 lbs since last time.   We will hold treatment today. We will give you some IV fluids. Try to drink at least 80 ounces of water today.  Return as scheduled.    Thank you for choosing [place] at [facility] to provide your oncology and hematology care.  To afford each patient quality time with our provider, please arrive at least 15 minutes before your scheduled appointment time.   If you have a lab appointment with the [facility] please come in thru the Main Entrance and check in at the main information desk.  You need to re-schedule your appointment should you arrive 10 or more minutes late.  We strive to give you quality time with our providers, and arriving late affects you and other patients whose appointments are after yours.  Also, if you no show three or more times for appointments you may be dismissed from the clinic at the providers discretion.     Again, thank you for choosing [facility].  Our hope is that these requests will decrease the amount of time that you wait before being seen by our physicians.       _____________________________________________________________  Should you have questions after your visit to [facility], please contact our office at [phone] and follow the prompts.  Our office hours are 8:00 a.m. and 4:30 p.m. Monday - Friday.  Please note that voicemails left after 4:00 p.m. may not be returned until the following business day.  We are closed weekends and major holidays.  You do have access to a nurse 24-7, just call the main number to the clinic [phone] and do not press any options, hold on the line and a nurse will answer the  phone.    For prescription refill requests, have your pharmacy contact our office and allow 72 hours.    Due to Covid, you will need to wear a mask upon entering the hospital. If you do not have a mask, a mask will be given to you at the Main Entrance upon arrival. For doctor visits, patients may have 1 support person age 48 or older with them. For treatment visits, patients can not have anyone with them due to social distancing guidelines and our immunocompromised population.

## 2022-04-03 LAB — CANCER ANTIGEN 19-9: CA 19-9: 106 U/mL — ABNORMAL HIGH (ref 0–35)

## 2022-04-06 ENCOUNTER — Other Ambulatory Visit: Payer: Self-pay

## 2022-04-16 ENCOUNTER — Ambulatory Visit: Payer: Medicare HMO | Admitting: Hematology

## 2022-04-16 ENCOUNTER — Other Ambulatory Visit: Payer: Medicare HMO

## 2022-04-16 ENCOUNTER — Ambulatory Visit: Payer: Medicare HMO

## 2022-04-16 DIAGNOSIS — Z95828 Presence of other vascular implants and grafts: Secondary | ICD-10-CM

## 2022-04-16 DIAGNOSIS — C259 Malignant neoplasm of pancreas, unspecified: Secondary | ICD-10-CM

## 2022-04-28 ENCOUNTER — Other Ambulatory Visit: Payer: Self-pay

## 2022-04-30 ENCOUNTER — Other Ambulatory Visit: Payer: Self-pay

## 2022-05-01 ENCOUNTER — Other Ambulatory Visit: Payer: Self-pay

## 2022-05-04 ENCOUNTER — Inpatient Hospital Stay: Payer: Medicare HMO

## 2022-05-04 ENCOUNTER — Inpatient Hospital Stay: Payer: Medicare HMO | Admitting: Hematology

## 2022-05-04 ENCOUNTER — Telehealth: Payer: Self-pay | Admitting: *Deleted

## 2022-05-04 ENCOUNTER — Encounter (HOSPITAL_COMMUNITY): Payer: Self-pay

## 2022-05-04 DIAGNOSIS — Z95828 Presence of other vascular implants and grafts: Secondary | ICD-10-CM

## 2022-05-04 DIAGNOSIS — C259 Malignant neoplasm of pancreas, unspecified: Secondary | ICD-10-CM

## 2022-05-04 NOTE — Telephone Encounter (Signed)
Received telephone call from daughter Judson Roch to advise that patient is running fever > 102 and is going to ER in [place].  Dr. Delton Coombes aware.  Chemo appointments cancelled for today.

## 2022-05-06 ENCOUNTER — Emergency Department (HOSPITAL_COMMUNITY): Payer: Medicare HMO

## 2022-05-06 ENCOUNTER — Encounter (HOSPITAL_COMMUNITY): Payer: Self-pay

## 2022-05-06 ENCOUNTER — Inpatient Hospital Stay (HOSPITAL_COMMUNITY)
Admission: EM | Admit: 2022-05-06 | Discharge: 2022-05-09 | DRG: 919 | Disposition: A | Discharge status: Home / Self Care | Payer: Medicare HMO | Attending: Internal Medicine | Admitting: Internal Medicine

## 2022-05-06 DIAGNOSIS — G250 Essential tremor: Secondary | ICD-10-CM | POA: Diagnosis present

## 2022-05-06 DIAGNOSIS — Z7982 Long term (current) use of aspirin: Secondary | ICD-10-CM

## 2022-05-06 DIAGNOSIS — T85590A Other mechanical complication of bile duct prosthesis, initial encounter: Secondary | ICD-10-CM | POA: Diagnosis present

## 2022-05-06 DIAGNOSIS — I10 Essential (primary) hypertension: Secondary | ICD-10-CM | POA: Diagnosis present

## 2022-05-06 DIAGNOSIS — T85858A Stenosis due to other internal prosthetic devices, implants and grafts, initial encounter: Secondary | ICD-10-CM | POA: Diagnosis not present

## 2022-05-06 DIAGNOSIS — R17 Unspecified jaundice: Principal | ICD-10-CM

## 2022-05-06 DIAGNOSIS — Z79899 Other long term (current) drug therapy: Secondary | ICD-10-CM

## 2022-05-06 DIAGNOSIS — C250 Malignant neoplasm of head of pancreas: Secondary | ICD-10-CM | POA: Diagnosis present

## 2022-05-06 DIAGNOSIS — G9341 Metabolic encephalopathy: Secondary | ICD-10-CM | POA: Insufficient documentation

## 2022-05-06 DIAGNOSIS — Z888 Allergy status to other drugs, medicaments and biological substances status: Secondary | ICD-10-CM

## 2022-05-06 DIAGNOSIS — Z91041 Radiographic dye allergy status: Secondary | ICD-10-CM

## 2022-05-06 DIAGNOSIS — Z8616 Personal history of COVID-19: Secondary | ICD-10-CM

## 2022-05-06 DIAGNOSIS — K831 Obstruction of bile duct: Secondary | ICD-10-CM | POA: Diagnosis present

## 2022-05-06 DIAGNOSIS — C259 Malignant neoplasm of pancreas, unspecified: Secondary | ICD-10-CM | POA: Diagnosis present

## 2022-05-06 DIAGNOSIS — K219 Gastro-esophageal reflux disease without esophagitis: Secondary | ICD-10-CM | POA: Diagnosis present

## 2022-05-06 DIAGNOSIS — E782 Mixed hyperlipidemia: Secondary | ICD-10-CM | POA: Diagnosis present

## 2022-05-06 DIAGNOSIS — Y831 Surgical operation with implant of artificial internal device as the cause of abnormal reaction of the patient, or of later complication, without mention of misadventure at the time of the procedure: Secondary | ICD-10-CM | POA: Diagnosis present

## 2022-05-06 LAB — CBC WITH DIFFERENTIAL/PLATELET
Abs Immature Granulocytes: 0.49 10*3/uL — ABNORMAL HIGH (ref 0.00–0.07)
Basophils Absolute: 0.1 10*3/uL (ref 0.0–0.1)
Basophils Relative: 1 %
Eosinophils Absolute: 0.2 10*3/uL (ref 0.0–0.5)
Eosinophils Relative: 2 %
HCT: 33.6 % — ABNORMAL LOW (ref 36.0–46.0)
Hemoglobin: 10.8 g/dL — ABNORMAL LOW (ref 12.0–15.0)
Immature Granulocytes: 4 %
Lymphocytes Relative: 17 %
Lymphs Abs: 1.9 10*3/uL (ref 0.7–4.0)
MCH: 28.9 pg (ref 26.0–34.0)
MCHC: 32.1 g/dL (ref 30.0–36.0)
MCV: 89.8 fL (ref 80.0–100.0)
Monocytes Absolute: 1.6 10*3/uL — ABNORMAL HIGH (ref 0.1–1.0)
Monocytes Relative: 15 %
Neutro Abs: 6.8 10*3/uL (ref 1.7–7.7)
Neutrophils Relative %: 61 %
Platelets: 223 10*3/uL (ref 150–400)
RBC: 3.74 MIL/uL — ABNORMAL LOW (ref 3.87–5.11)
RDW: 17.3 % — ABNORMAL HIGH (ref 11.5–15.5)
WBC: 11.2 10*3/uL — ABNORMAL HIGH (ref 4.0–10.5)
nRBC: 0 % (ref 0.0–0.2)

## 2022-05-06 LAB — COMPREHENSIVE METABOLIC PANEL
ALT: 72 U/L — ABNORMAL HIGH (ref 0–44)
AST: 34 U/L (ref 15–41)
Albumin: 2.6 g/dL — ABNORMAL LOW (ref 3.5–5.0)
Alkaline Phosphatase: 258 U/L — ABNORMAL HIGH (ref 38–126)
Anion gap: 7 (ref 5–15)
BUN: 25 mg/dL — ABNORMAL HIGH (ref 8–23)
CO2: 23 mmol/L (ref 22–32)
Calcium: 8.9 mg/dL (ref 8.9–10.3)
Chloride: 104 mmol/L (ref 98–111)
Creatinine, Ser: 0.92 mg/dL (ref 0.44–1.00)
GFR, Estimated: >60 mL/min (ref >60)
Glucose, Bld: 137 mg/dL — ABNORMAL HIGH (ref 70–99)
Potassium: 3.6 mmol/L (ref 3.5–5.1)
Sodium: 134 mmol/L — ABNORMAL LOW (ref 135–145)
Total Bilirubin: 4.8 mg/dL — ABNORMAL HIGH (ref 0.3–1.2)
Total Protein: 6.2 g/dL — ABNORMAL LOW (ref 6.5–8.1)

## 2022-05-06 LAB — LIPASE, BLOOD: Lipase: 63 U/L — ABNORMAL HIGH (ref 11–51)

## 2022-05-06 NOTE — ED Provider Triage Note (Signed)
Emergency Medicine Provider Triage Evaluation Note  Meagan [name] , a 87 y.o. female  was evaluated in triage.  Pt complains of jaundice x3 days.  Patient states she is undergoing chemotherapy for pancreatic cancer, states she has a "clogged duct".  States she had a CT done Saturday at a different hospital.  Denies any abdominal pain, nausea, vomiting, fevers..  Review of Systems  Per HPI  Physical Exam  BP (!) 153/83 (BP Location: Right Arm)   Pulse 64   Temp 98.3 F (36.8 C) (Oral)   Resp 19   SpO2 100%  Gen:   Awake, no distress   Resp:  Normal effort  MSK:   Moves extremities without difficulty  Other:    Medical Decision Making  Medically screening exam initiated at 4:38 PM.  Appropriate orders placed.  Meagan [name] was informed that the remainder of the evaluation will be completed by another provider, this initial triage assessment does not replace that evaluation, and the importance of remaining in the ED until their evaluation is complete.  Per chart review -  Dr Maylene Roes requesting we go back through [facility] Dr RAI PAGED FOR CONSULT. DR RAI NOW REQUESTING GI BE CONSULTED AND THEN HAVE HER RE PAGED    Sherrill Raring, PA-C 05/06/22 1640

## 2022-05-06 NOTE — ED Triage Notes (Signed)
Pt arrived via POV, c/o worsening jaundice. States she feels slightly better than she has, but concerned that duct is blocked.

## 2022-05-07 ENCOUNTER — Other Ambulatory Visit: Payer: Self-pay

## 2022-05-07 ENCOUNTER — Emergency Department (HOSPITAL_COMMUNITY): Payer: Medicare HMO

## 2022-05-07 DIAGNOSIS — T85590A Other mechanical complication of bile duct prosthesis, initial encounter: Secondary | ICD-10-CM | POA: Diagnosis present

## 2022-05-07 DIAGNOSIS — K831 Obstruction of bile duct: Secondary | ICD-10-CM

## 2022-05-07 DIAGNOSIS — G9341 Metabolic encephalopathy: Secondary | ICD-10-CM | POA: Insufficient documentation

## 2022-05-07 LAB — CBC WITH DIFFERENTIAL/PLATELET
Abs Immature Granulocytes: 0.45 10*3/uL — ABNORMAL HIGH (ref 0.00–0.07)
Basophils Absolute: 0.1 10*3/uL (ref 0.0–0.1)
Basophils Relative: 1 %
Eosinophils Absolute: 0.3 10*3/uL (ref 0.0–0.5)
Eosinophils Relative: 3 %
HCT: 29.5 % — ABNORMAL LOW (ref 36.0–46.0)
Hemoglobin: 9.5 g/dL — ABNORMAL LOW (ref 12.0–15.0)
Immature Granulocytes: 5 %
Lymphocytes Relative: 21 %
Lymphs Abs: 2.1 10*3/uL (ref 0.7–4.0)
MCH: 28.6 pg (ref 26.0–34.0)
MCHC: 32.2 g/dL (ref 30.0–36.0)
MCV: 88.9 fL (ref 80.0–100.0)
Monocytes Absolute: 1.1 10*3/uL — ABNORMAL HIGH (ref 0.1–1.0)
Monocytes Relative: 12 %
Neutro Abs: 5.7 10*3/uL (ref 1.7–7.7)
Neutrophils Relative %: 58 %
Platelets: 210 10*3/uL (ref 150–400)
RBC: 3.32 MIL/uL — ABNORMAL LOW (ref 3.87–5.11)
RDW: 17.2 % — ABNORMAL HIGH (ref 11.5–15.5)
WBC: 9.6 10*3/uL (ref 4.0–10.5)
nRBC: 0 % (ref 0.0–0.2)

## 2022-05-07 LAB — URINALYSIS, ROUTINE W REFLEX MICROSCOPIC
Bacteria, UA: NONE SEEN
Glucose, UA: NEGATIVE mg/dL
Hgb urine dipstick: NEGATIVE
Ketones, ur: NEGATIVE mg/dL
Leukocytes,Ua: NEGATIVE
Nitrite: NEGATIVE
Protein, ur: 30 mg/dL — AB
Specific Gravity, Urine: 1.021 (ref 1.005–1.030)
pH: 6 (ref 5.0–8.0)

## 2022-05-07 LAB — LACTIC ACID, PLASMA: Lactic Acid, Venous: 1.1 mmol/L (ref 0.5–1.9)

## 2022-05-07 LAB — COMPREHENSIVE METABOLIC PANEL
ALT: 59 U/L — ABNORMAL HIGH (ref 0–44)
AST: 37 U/L (ref 15–41)
Albumin: 2.3 g/dL — ABNORMAL LOW (ref 3.5–5.0)
Alkaline Phosphatase: 247 U/L — ABNORMAL HIGH (ref 38–126)
Anion gap: 7 (ref 5–15)
BUN: 18 mg/dL (ref 8–23)
CO2: 23 mmol/L (ref 22–32)
Calcium: 9.3 mg/dL (ref 8.9–10.3)
Chloride: 106 mmol/L (ref 98–111)
Creatinine, Ser: 0.79 mg/dL (ref 0.44–1.00)
GFR, Estimated: >60 mL/min (ref >60)
Glucose, Bld: 92 mg/dL (ref 70–99)
Potassium: 3.8 mmol/L (ref 3.5–5.1)
Sodium: 136 mmol/L (ref 135–145)
Total Bilirubin: 4.9 mg/dL — ABNORMAL HIGH (ref 0.3–1.2)
Total Protein: 5.5 g/dL — ABNORMAL LOW (ref 6.5–8.1)

## 2022-05-07 LAB — AMMONIA: Ammonia: 39 umol/L — ABNORMAL HIGH (ref 9–35)

## 2022-05-07 MED ORDER — PRIMIDONE 50 MG PO TABS: 25.0000 mg | ORAL_TABLET | Freq: Every day | ORAL | Status: DC | PRN

## 2022-05-07 MED ORDER — PROSIGHT PO TABS
1.0000 | ORAL_TABLET | Freq: Every day | ORAL | Status: DC
  Administered 2022-05-08: 1 via ORAL
  Filled 2022-05-07: qty 1

## 2022-05-07 MED ORDER — ENOXAPARIN SODIUM 40 MG/0.4ML IJ SOSY
40.0000 mg | PREFILLED_SYRINGE | INTRAMUSCULAR | Status: DC
  Administered 2022-05-09: 40 mg via SUBCUTANEOUS
  Filled 2022-05-07: qty 0.4

## 2022-05-07 MED ORDER — FAMOTIDINE 20 MG PO TABS
40.0000 mg | ORAL_TABLET | Freq: Every evening | ORAL | Status: DC
  Administered 2022-05-07 – 2022-05-08 (×2): 40 mg via ORAL
  Filled 2022-05-07 (×2): qty 2

## 2022-05-07 MED ORDER — PROCHLORPERAZINE EDISYLATE 10 MG/2ML IJ SOLN: 10.0000 mg | Freq: Four times a day (QID) | INTRAMUSCULAR | Status: DC | PRN

## 2022-05-07 MED ORDER — OCUVITE EYE HEALTH FORMULA PO CAPS: 1.0000 | ORAL_CAPSULE | Freq: Every day | ORAL | Status: DC

## 2022-05-07 MED ORDER — HYDRALAZINE HCL 20 MG/ML IJ SOLN: 10.0000 mg | Freq: Three times a day (TID) | INTRAMUSCULAR | Status: DC | PRN

## 2022-05-07 MED ORDER — VITAMIN D3 25 MCG (1000 UNIT) PO TABS
2000.0000 [IU] | ORAL_TABLET | Freq: Every day | ORAL | Status: DC
  Administered 2022-05-08: 2000 [IU] via ORAL
  Filled 2022-05-07: qty 2

## 2022-05-07 MED ORDER — GABAPENTIN 100 MG PO CAPS
100.0000 mg | ORAL_CAPSULE | Freq: Two times a day (BID) | ORAL | Status: DC
  Administered 2022-05-07 – 2022-05-09 (×3): 100 mg via ORAL
  Filled 2022-05-07 (×3): qty 1

## 2022-05-07 MED ORDER — PANTOPRAZOLE SODIUM 40 MG PO TBEC
80.0000 mg | DELAYED_RELEASE_TABLET | Freq: Every day | ORAL | Status: DC
  Administered 2022-05-09: 80 mg via ORAL
  Filled 2022-05-07: qty 2

## 2022-05-07 MED ORDER — AMLODIPINE BESYLATE 5 MG PO TABS
2.5000 mg | ORAL_TABLET | Freq: Every day | ORAL | Status: DC
  Administered 2022-05-07 – 2022-05-08 (×2): 2.5 mg via ORAL
  Filled 2022-05-07 (×2): qty 1

## 2022-05-07 MED ORDER — PIPERACILLIN-TAZOBACTAM 3.375 G IVPB
3.3750 g | Freq: Three times a day (TID) | INTRAVENOUS | Status: DC
  Administered 2022-05-07 – 2022-05-09 (×6): 3.375 g via INTRAVENOUS
  Filled 2022-05-07 (×6): qty 50

## 2022-05-07 MED ORDER — LACTATED RINGERS IV SOLN: INTRAVENOUS | Status: DC

## 2022-05-07 MED ORDER — ASPIRIN 81 MG PO TBEC
81.0000 mg | DELAYED_RELEASE_TABLET | Freq: Every day | ORAL | Status: DC
  Administered 2022-05-09: 81 mg via ORAL
  Filled 2022-05-07: qty 1

## 2022-05-07 MED ORDER — PIPERACILLIN-TAZOBACTAM 3.375 G IVPB 30 MIN
3.3750 g | Freq: Once | INTRAVENOUS | Status: AC
  Administered 2022-05-07: 3.375 g via INTRAVENOUS
  Filled 2022-05-07: qty 50

## 2022-05-07 MED ORDER — DICLOFENAC SUPPOSITORY 100 MG
100.0000 mg | Freq: Once | RECTAL | Status: DC
  Filled 2022-05-07: qty 1

## 2022-05-07 MED ORDER — ADULT MULTIVITAMIN W/MINERALS CH
1.0000 | ORAL_TABLET | Freq: Every day | ORAL | Status: DC
  Administered 2022-05-09: 1 via ORAL
  Filled 2022-05-07: qty 1

## 2022-05-07 MED ORDER — LACTATED RINGERS IV BOLUS (SEPSIS)
1000.0000 mL | Freq: Once | INTRAVENOUS | Status: AC
  Administered 2022-05-07: 1000 mL via INTRAVENOUS

## 2022-05-07 MED ORDER — NEBIVOLOL HCL 2.5 MG PO TABS
2.5000 mg | ORAL_TABLET | Freq: Every day | ORAL | Status: DC
  Administered 2022-05-09: 2.5 mg via ORAL
  Filled 2022-05-07 (×2): qty 1

## 2022-05-07 NOTE — ED Provider Notes (Signed)
[facility] DEPT Provider Note   CSN: [id number] Arrival date & time: 05/06/22  1551     History  Chief Complaint  Patient presents with   Jaundice    Meagan [name] is a 87 y.o. female.  Patient referred to the emergency department for admission for treatment of jaundice.  Patient has a history of pancreatic cancer and has had prior stenting of common bile duct.  Patient was admitted to an outside hospital recently with rising bilirubin levels.  She was sent here for further GI intervention.  Patient did have a fever of 102 a couple of days ago, has not had any further fever since.       Home Medications Prior to Admission medications   Medication Sig Start Date End Date Taking? Authorizing Provider  amLODipine (NORVASC) 2.5 MG tablet Take 2.5 mg by mouth at bedtime.    [provider]  aspirin EC 81 MG tablet Take 81 mg by mouth daily. Swallow whole.    [provider]  Cholecalciferol (VITAMIN D3) 50 MCG (2000 UT) TABS Take 2,000 Units by mouth daily after supper.    [provider]  Coenzyme Q10 (CO Q-10) 100 MG CAPS Take 100 mg by mouth every evening.    [provider]  famotidine (PEPCID) 40 MG tablet Take 40 mg by mouth every evening. 10/02/21   [provider]  gabapentin (NEURONTIN) 100 MG capsule Take 100 mg by mouth See admin instructions. Take 100 mg twice daily, may take a third 100 mg dose as needed for pain    [provider]  GEMCITABINE HCL IV Inject into the vein every 14 (fourteen) days. 02/05/22   [provider]  lidocaine-prilocaine (EMLA) cream Apply a small amount to port a cath site (do not rub in) and cover with plastic wrap 1 hour prior to infusion appointments 01/27/22   Derek Jack, MD  meclizine (ANTIVERT) 25 MG tablet Take 25 mg by mouth daily as needed for dizziness.    [provider]  Misc Natural Products (JOINT SUPPORT) CAPS Take 1  capsule by mouth daily with breakfast.    [provider]  Multiple Vitamin (MULTIVITAMIN) tablet Take 1 tablet by mouth daily with breakfast.    [provider]  Multiple Vitamins-Minerals ([place]) CAPS Take 1 capsule by mouth daily.    [provider]  nebivolol (BYSTOLIC) 5 MG tablet Take 2.5 mg by mouth in the morning.    [provider]  omeprazole (PRILOSEC) 40 MG capsule Take 40 mg by mouth daily before breakfast.    [provider]  ondansetron (ZOFRAN-ODT) 4 MG disintegrating tablet Take 4 mg by mouth every 8 (eight) hours as needed for nausea or vomiting. 10/01/21   [provider]  PACLitaxel-protein bound in Empty Containers Flexible 1 each Inject into the vein every 14 (fourteen) days. 02/05/22   [provider]  polyethylene glycol (MIRALAX) 17 g packet Take 17 g by mouth daily as needed. Patient taking differently: Take 17 g by mouth daily as needed for moderate constipation. 09/11/21   Shelly Coss, MD  Polyvinyl Alcohol-Povidone (REFRESH OP) Place 1 drop into both eyes daily as needed (tired eyes).    [provider]  primidone (MYSOLINE) 50 MG tablet Take 25 mg by mouth daily as needed (for tremors).    [provider]  prochlorperazine (COMPAZINE) 10 MG tablet Take 1 tablet (10 mg total) by mouth every 6 (six) hours as  needed for nausea or vomiting. 01/27/22   Derek Jack, MD  pyridOXINE (VITAMIN B-6) 100 MG tablet Take 50 mg by mouth in the morning.    [provider]  tiZANidine (ZANAFLEX) 2 MG tablet Take 1 mg by mouth daily as needed for muscle spasms.    [provider]      Allergies    Ivp dye [iodinated contrast media] and Lisinopril    Review of Systems   Review of Systems  Physical Exam Updated Vital Signs BP 139/64   Pulse 66   Temp 98 F (36.7 C) (Oral)   Resp 13   SpO2 99%  Physical Exam Vitals and nursing note reviewed.   Constitutional:      General: She is not in acute distress.    Appearance: She is well-developed. She is ill-appearing.  HENT:     Head: Normocephalic and atraumatic.     Mouth/Throat:     Mouth: Mucous membranes are moist.  Eyes:     General: Vision grossly intact. Gaze aligned appropriately. Scleral icterus present.     Extraocular Movements: Extraocular movements intact.     Conjunctiva/sclera: Conjunctivae normal.  Cardiovascular:     Rate and Rhythm: Normal rate and regular rhythm.     Pulses: Normal pulses.     Heart sounds: Normal heart sounds, S1 normal and S2 normal. No murmur heard.    No friction rub. No gallop.  Pulmonary:     Effort: Pulmonary effort is normal. No respiratory distress.     Breath sounds: Normal breath sounds.  Abdominal:     General: Bowel sounds are normal.     Palpations: Abdomen is soft.     Tenderness: There is no abdominal tenderness. There is no guarding or rebound.     Hernia: No hernia is present.  Musculoskeletal:        General: No swelling.     Cervical back: Full passive range of motion without pain, normal range of motion and neck supple. No spinous process tenderness or muscular tenderness. Normal range of motion.     Right lower leg: No edema.     Left lower leg: No edema.  Skin:    General: Skin is warm and dry.     Capillary Refill: Capillary refill takes less than 2 seconds.     Coloration: Skin is jaundiced.     Findings: No ecchymosis, erythema, rash or wound.  Neurological:     General: No focal deficit present.     Mental Status: She is alert and oriented to person, place, and time.     GCS: GCS eye subscore is 4. GCS verbal subscore is 5. GCS motor subscore is 6.     Cranial Nerves: Cranial nerves 2-12 are intact.     Sensory: Sensation is intact.     Motor: Motor function is intact.     Coordination: Coordination is intact.  Psychiatric:        Attention and Perception: Attention normal.        Mood and Affect: Mood  normal.        Speech: Speech normal.        Behavior: Behavior normal.     ED Results / Procedures / Treatments   Labs (all labs ordered are listed, but only abnormal results are displayed) Labs Reviewed  CBC WITH DIFFERENTIAL/PLATELET - Abnormal; Notable for the following components:      Result Value   WBC 11.2 (*)    RBC 3.74 (*)  Hemoglobin 10.8 (*)    HCT 33.6 (*)    RDW 17.3 (*)    Monocytes Absolute 1.6 (*)    Abs Immature Granulocytes 0.49 (*)    All other components within normal limits  COMPREHENSIVE METABOLIC PANEL - Abnormal; Notable for the following components:   Sodium 134 (*)    Glucose, Bld 137 (*)    BUN 25 (*)    Total Protein 6.2 (*)    Albumin 2.6 (*)    ALT 72 (*)    Alkaline Phosphatase 258 (*)    Total Bilirubin 4.8 (*)    All other components within normal limits  LIPASE, BLOOD - Abnormal; Notable for the following components:   Lipase 63 (*)    All other components within normal limits  URINALYSIS, ROUTINE W REFLEX MICROSCOPIC - Abnormal; Notable for the following components:   Color, Urine AMBER (*)    Bilirubin Urine SMALL (*)    Protein, ur 30 (*)    All other components within normal limits  CULTURE, BLOOD (ROUTINE X 2)  CULTURE, BLOOD (ROUTINE X 2)  LACTIC ACID, PLASMA    EKG EKG Interpretation  Date/Time:  Thursday May 07 2022 01:34:06 EST Ventricular Rate:  72 PR Interval:  153 QRS Duration: 97 QT Interval:  374 QTC Calculation: 410 R Axis:   52 Text Interpretation: Sinus rhythm Normal ECG Confirmed by Orpah Greek ([id number]) on 05/07/2022 1:36:18 AM  Radiology DG Chest Port 1 View  Result Date: 05/07/2022 CLINICAL DATA:  Questionable sepsis - evaluate for abnormality. EXAM: PORTABLE CHEST 1 VIEW COMPARISON:  09/08/2021 FINDINGS: Right Port-A-Cath in place with the tip at the cavoatrial junction. Left pacer remains in place, unchanged. Heart and mediastinal contours are within normal limits. No focal opacities or  effusions. No acute bony abnormality. Aortic atherosclerosis. IMPRESSION: No active cardiopulmonary disease. Electronically Signed   By: Rolm Baptise M.D.   On: 05/07/2022 02:06   CT ABDOMEN PELVIS WO CONTRAST  Result Date: 05/06/2022 CLINICAL DATA:  Pancreatic cancer with CBD stent. Abdominal pain and jaundice. * Tracking Code: BO * EXAM: CT ABDOMEN AND PELVIS WITHOUT CONTRAST TECHNIQUE: Multidetector CT imaging of the abdomen and pelvis was performed following the standard protocol without IV contrast. RADIATION DOSE REDUCTION: This exam was performed according to the departmental dose-optimization program which includes automated exposure control, adjustment of the mA and/or kV according to patient size and/or use of iterative reconstruction technique. COMPARISON:  Abdominal ultrasound 05/06/2022; CT abdomen 05/08/2022 FINDINGS: Lower chest: Mild scarring in the right lower lobe and mild subsegmental atelectasis in the left lower lobe with trace bilateral pleural effusions. Pacer leads noted. Mild mitral valve calcification. Descending thoracic aortic atherosclerotic vascular calcification. Hepatobiliary: Pneumobilia noted favoring patency of the stent although components of the intrahepatic biliary tree are slightly more gas distended than they were on 01/16/2022. Portions of the stent or filled with intermediate density which is probably fluid but technically nonspecific. Portions the right hepatic lobe have mild intrahepatic biliary dilatation. Contracted gallbladder. Pancreas: The pancreatic head mass is poorly characterized on today's noncontrast examination. No substantial dorsal pancreatic duct dilatation. Spleen: Unremarkable Adrenals/Urinary Tract: Stable benign cyst exophytic from the right kidney lower pole. No further imaging workup of this lesion is indicated. Three punctate nonobstructive left renal calculi are present. No hydronephrosis or hydroureter. Urinary bladder unremarkable. Adrenal  glands unremarkable. Stomach/Bowel: Unremarkable Vascular/Lymphatic: Atherosclerosis is present, including aortoiliac atherosclerotic disease. Reproductive: Unremarkable Other: Subtle edema in the perirenal spaces and mesentery as well as  the subcutaneous tissues potentially from third spacing of fluid. Musculoskeletal: Lumbar spondylosis and degenerative disc disease causing mild multilevel impingement. IMPRESSION: 1. There is some very mild intrahepatic biliary dilatation along with pneumobilia. In light of the increasing serum bilirubin levels, this could reflect very early or partial stent malfunction/occlusion. Portions of the expandable CBD stent are filled with fluid or soft tissue density. 2. The pancreatic tumor itself is poorly characterized on today's noncontrast CT exam. 3. Likely mild third say pacing of fluid with low-grade mesenteric, retroperitoneal, and subcutaneous edema along with trace bilateral pleural effusions. 4. Nonobstructive left nephrolithiasis. 5. Aortic Atherosclerosis (ICD10-I70.0). Mild mitral valve calcification. 6. Mild multilevel lumbar impingement. Electronically Signed   By: Van Clines M.D.   On: 05/06/2022 20:12   US Abdomen Limited RUQ (LIVER/GB)  Result Date: 05/06/2022 CLINICAL DATA:  Jaundice, history of common bile duct stent, pancreatic cancer EXAM: ULTRASOUND ABDOMEN LIMITED RIGHT UPPER QUADRANT COMPARISON:  01/16/2022 FINDINGS: Gallbladder: Gallbladder is moderately distended, with no evidence of cholelithiasis. Minimal echogenic sludge is identified. Borderline gallbladder wall thickening measuring 3 mm. Negative sonographic Murphy sign. No free fluid surrounding the gallbladder. Common bile duct: Diameter: Common bile duct stent is identified. The common bile duct measures up to 8 mm in diameter. Liver: Liver demonstrates normal echotexture. No focal lesion is identified. There is mild intrahepatic biliary duct dilation, not appreciably changed from prior  CT. Ring down artifact consistent with pneumobilia, consistent with at least partial patency of the common bile duct stent. Portal vein is patent on color Doppler imaging with normal direction of blood flow towards the liver. Other: Stable exophytic simple 6 cm right renal cyst does not require follow-up. IMPRESSION: 1. Common bile duct stent as above. There is mild persistent intrahepatic biliary duct dilation, with evidence of pneumobilia suggesting at least partial patency of the biliary stent. If further evaluation of the stent is desired, CT could be performed. 2. Gallbladder sludge, with no evidence of cholelithiasis or acute cholecystitis. Electronically Signed   By: Randa Ngo M.D.   On: 05/06/2022 19:27    Procedures Procedures    Medications Ordered in ED Medications  lactated ringers bolus 1,000 mL (has no administration in time range)  piperacillin-tazobactam (ZOSYN) IVPB 3.375 g (has no administration in time range)    ED Course/ Medical Decision Making/ A&P                           Medical Decision Making Amount and/or Complexity of Data Reviewed Independent Historian: caregiver External Data Reviewed: labs, radiology, ECG and notes. Labs: ordered. Decision-making details documented in ED Course. Radiology: ordered and independent interpretation performed. Decision-making details documented in ED Course. ECG/medicine tests: ordered and independent interpretation performed. Decision-making details documented in ED Course.  Risk Prescription drug management. Decision regarding hospitalization.   Patient with history of pancreatic cancer, treated at any point hospital.  She has had prior common bile duct stenting.  Referred to the ED after she has been found to have elevated bilirubin.  Patient with mild jaundice and scleral icterus on exam.  No abdominal pain or tenderness.  She is not currently febrile but has run a fever in the last few days.  CT scan with pneumobilia,  mild ductal dilatation concerning for stent failure.  Coverage with Zosyn initiated.  Will admit to hospitalist service, contact [facility] to see patient in a.m.        Final Clinical Impression(s) / ED Diagnoses  Final diagnoses:  Jaundice    Rx / DC Orders ED Discharge Orders     None         Sami Roes, Gwenyth Allegra, MD 05/07/22 0225

## 2022-05-07 NOTE — Consult Note (Addendum)
Consultation Note   Referring Provider:  [facility] PCP: Meagan [name], [place] Primary Gastroenterologist: Meagan Shorts, MD  Reason for consultation: jaundice  Hospital Day: 2  Assessment    Patient profile:  Meagan [name] is a 87 y.o. female with a past medical history significant for pancreatic cancer, HTN, HLD, vertigo, GERD. See PMH for any additional medical problems. Admitted  # 87 yo female with pancreatic cancer s/p placement of uncovered metal biliary stent 10/29/21. Admitted with biliary obstruction / fever at home. CT scan showing that portions of the CBD stent are filled with fluid or soft tissue density . On antibiotics and leukocytosis has resolved. Bilirubin down from 12 to 4.9 and fevers resolved   # See PMH for additional medical problems  Plan   Continue Zosyn She will need ERCP to clean out metal stent. The benefits and risks of ERCP not limited to cardiopulmonary complications of sedation, bleeding, infection, perforation,and pancreatitis were discussed with the patient who agrees to proceed.   ERCP will not be done today. Will given heart healthy diet.  --------------------------------------------------------------------------------------------------------------------------------------------------------------------  GI attending:   I have also seen and evaluated this patient in person with history and physical exam and chart review and agree with the NP note.  Patient known to me previous ERCP, she has pancreatic cancer with biliary stricture treated with an uncovered metal stent in May now showing signs of stent occlusion and transient sepsis.  She is much better overall.  She needs a repeat ERCP with evaluation of the stent, may need to open this up by using a balloon or basket to remove debris versus placing additional stents.  We might be able to get this done tomorrow I am not sure.  Will hold her n.p.o.  after midnight and see if  we can get it scheduled.  If not we will most likely be Saturday unless her situation deteriorates and it becomes more urgent.   Meagan Mayer, MD, [facility] See Meagan [name] on call - gastroenterology for best contact person 05/07/2022 5:33 PM   HPI   Patient went to [facility] ED yesterday with fevers / weakness / altered mental status.  She didn't have any nausea / vomiting or abdominal pain. There was concern for blockage of biliary stent . CT scan with contrast moderate intrahepatic ductal dilation, pneumobilia, mild gallbladder distention, circumferential mucosal thickening of small bowel loops in mid abdomen and moderate constipation. She was transferred here for management. Non-contrast CT scan here shows very mild intrahepatic biliary dilatation along with pneumobilia. Portions of the expandable CBD stent are filled with fluid or soft tissue density  Meagan [name] feels better today. Her bilirubin is down from 12.5 at [facility] to 4.9 today. She has no urinary symptoms. She does have a dry cough since having COVID last month. She has been eating and drinking at home okay.   Significant studies:    WBC 11.2 > 9.6 today Hgb stable at 9.5 Today's labs: alk phos 247, Tbili 4.9 ( normal on 11/2),  AST 37 / ALT 59   Previous GI Evaluation     April 2023 ERCP -Indeterminate mid common bile duct stricture as described. Status post brushings. Status post stent placement FINAL MICROSCOPIC DIAGNOSIS:  -  No malignant cells identified    10/03/21 ERCP -A single biliary stricture was found in the middle third of the main bile duct. The stricture was malignant appearing. This stricture was treated with stent replacement 10 Fr 7 cm. The original stent had migtrated slightly and I think the proximal aspect was clogged though not certain. The proximal side flange was patent, though.   10/29/21 ERCP -Prior biliary sphincterotomy appeared open. - One visibly patent stent  from the biliary tree was seen in the major papilla - removed and sent for cytology. - A single severe biliary stricture was found in the lower third of the main bile duct. The stricture was malignant appearing. - The upper third of the main bile duct and middle third of the main bile duct were moderately dilated, secondary to aforementioned stricture. - The biliary tree was swept and sludge was found. - Cells for cytology obtained in the lower third of the main duct stricture x 2. - Cholangioscopy performed and could not traverse the stricture. Spybite biopsies performed. - As we received preliminary cytology results of malignancy, we transitioned and placed one uncovered metal biliary stent was placed into the common bile duct. The stent was dilated to allow for further stent expansion.   10/29/21 EUS EGD Impression: - J-shaped deformity of stomach. - No gross lesions in the duodenal bulb, in the first portion of the duodenum and in the second portion of the duodenum. - Previous biliary stent had been removed with patent sphinterotomy present. EUS Impression: - A mass-like area/region was identified in the superior pancreatic head. Cytology results are pending. However, the endosonographic appearance is suggestive of adenocarcinoma. This was staged T2 N0 M1 (based on endosonographic criteria of metastasis to the liver - tissue was not obtained due to blood vessel being in the way). The staging applies if malignancy is confirmed. Fine needle biopsy performed. - There was dilation in the common hepatic duct with sludge. - Hyperechoic material consistent with sludge was visualized endosonographically in the gallbladder. - A lesion was found in the visualized portion of the liver. The lesion was hypoechoic not anechoic and is concerning for a possible liver metastasis rather than cyst (but could still end up being). The lesion measured 13 mm by 10 mm. - No malignant-appearing lymph nodes were  visualized in the celiac region (level 20), peripancreatic region and porta hepatis region.  A. BILIARY STRICTURE, BIOPSY:  - Minute fragment of biliary mucosa with reactive changes  - Negative for dysplasia or malignancy   FINAL MICROSCOPIC DIAGNOSIS:   A. BILIARY, STENT REMOVAL:  - Atypical cells present   B. PANCREAS, HEAD, FINE NEEDLE ASPIRATION:  - Malignant cells consistent with adenocarcinoma   C. BILIARY, STRICTURE, BRUSHING:  - Malignant cells consistent with adenocarcinoma    Recent Labs and Imaging DG Chest Port 1 View  Result Date: 05/07/2022 CLINICAL DATA:  Questionable sepsis - evaluate for abnormality. EXAM: PORTABLE CHEST 1 VIEW COMPARISON:  09/08/2021 FINDINGS: Right Port-A-Cath in place with the tip at the cavoatrial junction. Left pacer remains in place, unchanged. Heart and mediastinal contours are within normal limits. No focal opacities or effusions. No acute bony abnormality. Aortic atherosclerosis. IMPRESSION: No active cardiopulmonary disease. Electronically Signed   By: Rolm Baptise M.D.   On: 05/07/2022 02:06   CT ABDOMEN PELVIS WO CONTRAST  Result Date: 05/06/2022 CLINICAL DATA:  Pancreatic cancer with CBD stent. Abdominal pain and jaundice. * Tracking Code: BO * EXAM: CT ABDOMEN AND PELVIS WITHOUT CONTRAST TECHNIQUE:  Multidetector CT imaging of the abdomen and pelvis was performed following the standard protocol without IV contrast. RADIATION DOSE REDUCTION: This exam was performed according to the departmental dose-optimization program which includes automated exposure control, adjustment of the mA and/or kV according to patient size and/or use of iterative reconstruction technique. COMPARISON:  Abdominal ultrasound 05/06/2022; CT abdomen 05/08/2022 FINDINGS: Lower chest: Mild scarring in the right lower lobe and mild subsegmental atelectasis in the left lower lobe with trace bilateral pleural effusions. Pacer leads noted. Mild mitral valve calcification.  Descending thoracic aortic atherosclerotic vascular calcification. Hepatobiliary: Pneumobilia noted favoring patency of the stent although components of the intrahepatic biliary tree are slightly more gas distended than they were on 01/16/2022. Portions of the stent or filled with intermediate density which is probably fluid but technically nonspecific. Portions the right hepatic lobe have mild intrahepatic biliary dilatation. Contracted gallbladder. Pancreas: The pancreatic head mass is poorly characterized on today's noncontrast examination. No substantial dorsal pancreatic duct dilatation. Spleen: Unremarkable Adrenals/Urinary Tract: Stable benign cyst exophytic from the right kidney lower pole. No further imaging workup of this lesion is indicated. Three punctate nonobstructive left renal calculi are present. No hydronephrosis or hydroureter. Urinary bladder unremarkable. Adrenal glands unremarkable. Stomach/Bowel: Unremarkable Vascular/Lymphatic: Atherosclerosis is present, including aortoiliac atherosclerotic disease. Reproductive: Unremarkable Other: Subtle edema in the perirenal spaces and mesentery as well as the subcutaneous tissues potentially from third spacing of fluid. Musculoskeletal: Lumbar spondylosis and degenerative disc disease causing mild multilevel impingement. IMPRESSION: 1. There is some very mild intrahepatic biliary dilatation along with pneumobilia. In light of the increasing serum bilirubin levels, this could reflect very early or partial stent malfunction/occlusion. Portions of the expandable CBD stent are filled with fluid or soft tissue density. 2. The pancreatic tumor itself is poorly characterized on today's noncontrast CT exam. 3. Likely mild third say pacing of fluid with low-grade mesenteric, retroperitoneal, and subcutaneous edema along with trace bilateral pleural effusions. 4. Nonobstructive left nephrolithiasis. 5. Aortic Atherosclerosis (ICD10-I70.0). Mild mitral valve  calcification. 6. Mild multilevel lumbar impingement. Electronically Signed   By: Van Clines M.D.   On: 05/06/2022 20:12   US Abdomen Limited RUQ (LIVER/GB)  Result Date: 05/06/2022 CLINICAL DATA:  Jaundice, history of common bile duct stent, pancreatic cancer EXAM: ULTRASOUND ABDOMEN LIMITED RIGHT UPPER QUADRANT COMPARISON:  01/16/2022 FINDINGS: Gallbladder: Gallbladder is moderately distended, with no evidence of cholelithiasis. Minimal echogenic sludge is identified. Borderline gallbladder wall thickening measuring 3 mm. Negative sonographic Murphy sign. No free fluid surrounding the gallbladder. Common bile duct: Diameter: Common bile duct stent is identified. The common bile duct measures up to 8 mm in diameter. Liver: Liver demonstrates normal echotexture. No focal lesion is identified. There is mild intrahepatic biliary duct dilation, not appreciably changed from prior CT. Ring down artifact consistent with pneumobilia, consistent with at least partial patency of the common bile duct stent. Portal vein is patent on color Doppler imaging with normal direction of blood flow towards the liver. Other: Stable exophytic simple 6 cm right renal cyst does not require follow-up. IMPRESSION: 1. Common bile duct stent as above. There is mild persistent intrahepatic biliary duct dilation, with evidence of pneumobilia suggesting at least partial patency of the biliary stent. If further evaluation of the stent is desired, CT could be performed. 2. Gallbladder sludge, with no evidence of cholelithiasis or acute cholecystitis. Electronically Signed   By: Randa Ngo M.D.   On: 05/06/2022 19:27    Labs:  Recent Labs    05/06/22 1642 05/07/22  0930  WBC 11.2* 9.6  HGB 10.8* 9.5*  HCT 33.6* 29.5*  PLT 223 210   Recent Labs    05/06/22 1642 05/07/22 0930  NA 134* 136  K 3.6 3.8  CL 104 106  CO2 23 23  GLUCOSE 137* 92  BUN 25* 18  CREATININE 0.92 0.79  CALCIUM 8.9 9.3   Recent Labs     05/07/22 0930  PROT 5.5*  ALBUMIN 2.3*  AST 37  ALT 59*  ALKPHOS 247*  BILITOT 4.9*   No results for input(s): "HEPBSAG", "HCVAB", "HEPAIGM", "HEPBIGM" in the last 72 hours. No results for input(s): "LABPROT", "INR" in the last 72 hours.  Past Medical History:  Diagnosis Date   Essential tremor    GERD (gastroesophageal reflux disease)    High cholesterol    Hypertension    PONV (postoperative nausea and vomiting)    Port-A-Cath in place 01/27/2022   Vertigo     Past Surgical History:  Procedure Laterality Date   BILIARY BRUSHING  09/10/2021   Procedure: BILIARY BRUSHING;  Surgeon: Irene Shipper, MD;  Location: [facility] ENDOSCOPY;  Service: Gastroenterology;;   BILIARY BRUSHING  10/29/2021   Procedure: BILIARY BRUSHING;  Surgeon: Irving Copas., MD;  Location: [facility] ENDOSCOPY;  Service: Gastroenterology;;   BILIARY DILATION  10/29/2021   Procedure: BILIARY DILATION;  Surgeon: Irving Copas., MD;  Location: [facility] ENDOSCOPY;  Service: Gastroenterology;;   BILIARY STENT PLACEMENT  09/10/2021   Procedure: BILIARY STENT PLACEMENT;  Surgeon: Irene Shipper, MD;  Location: [facility] ENDOSCOPY;  Service: Gastroenterology;;   BILIARY STENT PLACEMENT N/A 10/03/2021   Procedure: BILIARY STENT PLACEMENT;  Surgeon: Meagan Mayer, MD;  Location: [facility] ENDOSCOPY;  Service: Gastroenterology;  Laterality: N/A;   BILIARY STENT PLACEMENT N/A 10/29/2021   Procedure: BILIARY STENT PLACEMENT;  Surgeon: Rush Landmark Telford Nab., MD;  Location: [facility] ENDOSCOPY;  Service: Gastroenterology;  Laterality: N/A;   BIOPSY  10/29/2021   Procedure: BIOPSY;  Surgeon: Rush Landmark Telford Nab., MD;  Location: [facility] ENDOSCOPY;  Service: Gastroenterology;;   ENDOSCOPIC RETROGRADE CHOLANGIOPANCREATOGRAPHY (ERCP) WITH PROPOFOL N/A 10/29/2021   Procedure: ENDOSCOPIC RETROGRADE CHOLANGIOPANCREATOGRAPHY (ERCP) WITH PROPOFOL;  Surgeon: Irving Copas., MD;  Location: [facility] ENDOSCOPY;  Service: Gastroenterology;  Laterality: N/A;    ERCP N/A 09/10/2021   Procedure: ENDOSCOPIC RETROGRADE CHOLANGIOPANCREATOGRAPHY (ERCP);  Surgeon: Irene Shipper, MD;  Location: [place];  Service: Gastroenterology;  Laterality: N/A;   ERCP N/A 10/03/2021   Procedure: ENDOSCOPIC RETROGRADE CHOLANGIOPANCREATOGRAPHY (ERCP);  Surgeon: Meagan Mayer, MD;  Location: [facility] ENDOSCOPY;  Service: Gastroenterology;  Laterality: N/A;   ESOPHAGOGASTRODUODENOSCOPY (EGD) WITH PROPOFOL N/A 10/29/2021   Procedure: ESOPHAGOGASTRODUODENOSCOPY (EGD) WITH PROPOFOL;  Surgeon: Rush Landmark Telford Nab., MD;  Location: [facility] ENDOSCOPY;  Service: Gastroenterology;  Laterality: N/A;   EUS N/A 10/29/2021   Procedure: UPPER ENDOSCOPIC ULTRASOUND (EUS) RADIAL;  Surgeon: Irving Copas., MD;  Location: [facility] ENDOSCOPY;  Service: Gastroenterology;  Laterality: N/A;   FINE NEEDLE ASPIRATION  10/29/2021   Procedure: FINE NEEDLE ASPIRATION (FNA) LINEAR;  Surgeon: Irving Copas., MD;  Location: [facility] ENDOSCOPY;  Service: Gastroenterology;;   IR IMAGING GUIDED PORT INSERTION  01/28/2022   PACEMAKER IMPLANT     REMOVAL OF STONES  10/29/2021   Procedure: REMOVAL OF SLUDGE;  Surgeon: Irving Copas., MD;  Location: [facility] ENDOSCOPY;  Service: Gastroenterology;;   Joan Mayans  09/10/2021   Procedure: Joan Mayans;  Surgeon: Irene Shipper, MD;  Location: [facility] ENDOSCOPY;  Service: Gastroenterology;;   Bess Kinds CHOLANGIOSCOPY N/A 10/29/2021   Procedure:  SPYGLASS CHOLANGIOSCOPY;  Surgeon: Rush Landmark Telford Nab., MD;  Location: [facility] ENDOSCOPY;  Service: Gastroenterology;  Laterality: N/A;   STENT REMOVAL  10/03/2021   Procedure: STENT REMOVAL;  Surgeon: Meagan Mayer, MD;  Location: [facility] ENDOSCOPY;  Service: Gastroenterology;;    Family History  Problem Relation Age of Onset   Stomach cancer Neg Hx    Esophageal cancer Neg Hx    Colon cancer Neg Hx    Pancreatic cancer Neg Hx     Prior to Admission medications   Medication Sig Start Date End Date Taking? Authorizing  Provider  amLODipine (NORVASC) 2.5 MG tablet Take 2.5 mg by mouth at bedtime.   Yes [provider]  aspirin EC 81 MG tablet Take 81 mg by mouth daily. Swallow whole.   Yes [provider]  Cholecalciferol (VITAMIN D3) 50 MCG (2000 UT) TABS Take 2,000 Units by mouth daily after supper.   Yes [provider]  Coenzyme Q10 (CO Q-10) 100 MG CAPS Take 100 mg by mouth every evening.   Yes [provider]  famotidine (PEPCID) 40 MG tablet Take 40 mg by mouth every evening. 10/02/21  Yes [provider]  gabapentin (NEURONTIN) 100 MG capsule Take 100 mg by mouth 2 (two) times daily.   Yes [provider]  lidocaine-prilocaine (EMLA) cream Apply a small amount to port a cath site (do not rub in) and cover with plastic wrap 1 hour prior to infusion appointments 01/27/22  Yes Derek Jack, MD  meclizine (ANTIVERT) 25 MG tablet Take 25 mg by mouth daily as needed for dizziness.   Yes [provider]  Misc Natural Products (JOINT SUPPORT) CAPS Take 1 capsule by mouth daily with breakfast.   Yes [provider]  Multiple Vitamin (MULTIVITAMIN) tablet Take 1 tablet by mouth daily with breakfast.   Yes [provider]  Multiple Vitamins-Minerals ([place]) CAPS Take 1 capsule by mouth daily.   Yes [provider]  nebivolol (BYSTOLIC) 5 MG tablet Take 2.5 mg by mouth in the morning.   Yes [provider]  omeprazole (PRILOSEC) 40 MG capsule Take 40 mg by mouth daily before breakfast.   Yes [provider]  polyethylene glycol (MIRALAX) 17 g packet Take 17 g by mouth daily as needed. Patient taking differently: Take 17 g by mouth daily as needed for moderate constipation. 09/11/21  Yes Shelly Coss, MD  Polyvinyl Alcohol-Povidone (REFRESH OP) Place 1 drop into both eyes daily as needed (tired eyes).   Yes [provider]  primidone (MYSOLINE) 50 MG tablet Take 25 mg by mouth  daily as needed (for tremors).   Yes [provider]  prochlorperazine (COMPAZINE) 10 MG tablet Take 1 tablet (10 mg total) by mouth every 6 (six) hours as needed for nausea or vomiting. 01/27/22  Yes Derek Jack, MD  pyridOXINE (VITAMIN B-6) 100 MG tablet Take 50 mg by mouth in the morning.   Yes [provider]  tiZANidine (ZANAFLEX) 2 MG tablet Take 1 mg by mouth daily as needed for muscle spasms.   Yes [provider]    Current Facility-Administered Medications  Medication Dose Route Frequency Provider Last Rate Last Admin   piperacillin-tazobactam (ZOSYN) IVPB 3.375 g  3.375 g Intravenous Q8H Wofford, Drew A, RPH 12.5 mL/hr at 05/07/22 1021 3.375 g at 05/07/22 1021   Current Outpatient Medications  Medication Sig Dispense Refill   amLODipine (NORVASC) 2.5 MG tablet Take 2.5 mg by mouth at bedtime.  aspirin EC 81 MG tablet Take 81 mg by mouth daily. Swallow whole.     Cholecalciferol (VITAMIN D3) 50 MCG (2000 UT) TABS Take 2,000 Units by mouth daily after supper.     Coenzyme Q10 (CO Q-10) 100 MG CAPS Take 100 mg by mouth every evening.     famotidine (PEPCID) 40 MG tablet Take 40 mg by mouth every evening.     gabapentin (NEURONTIN) 100 MG capsule Take 100 mg by mouth 2 (two) times daily.     lidocaine-prilocaine (EMLA) cream Apply a small amount to port a cath site (do not rub in) and cover with plastic wrap 1 hour prior to infusion appointments 30 g 3   meclizine (ANTIVERT) 25 MG tablet Take 25 mg by mouth daily as needed for dizziness.     Misc Natural Products (JOINT SUPPORT) CAPS Take 1 capsule by mouth daily with breakfast.     Multiple Vitamin (MULTIVITAMIN) tablet Take 1 tablet by mouth daily with breakfast.     Multiple Vitamins-Minerals (OCUVITE EYE HEALTH FORMULA) CAPS Take 1 capsule by mouth daily.     nebivolol (BYSTOLIC) 5 MG tablet Take 2.5 mg by mouth in the morning.     omeprazole (PRILOSEC) 40 MG capsule Take 40 mg by mouth daily  before breakfast.     polyethylene glycol (MIRALAX) 17 g packet Take 17 g by mouth daily as needed. (Patient taking differently: Take 17 g by mouth daily as needed for moderate constipation.) 14 each 0   Polyvinyl Alcohol-Povidone (REFRESH OP) Place 1 drop into both eyes daily as needed (tired eyes).     primidone (MYSOLINE) 50 MG tablet Take 25 mg by mouth daily as needed (for tremors).     prochlorperazine (COMPAZINE) 10 MG tablet Take 1 tablet (10 mg total) by mouth every 6 (six) hours as needed for nausea or vomiting. 60 tablet 6   pyridOXINE (VITAMIN B-6) 100 MG tablet Take 50 mg by mouth in the morning.     tiZANidine (ZANAFLEX) 2 MG tablet Take 1 mg by mouth daily as needed for muscle spasms.      Allergies as of 05/06/2022 - Review Complete 05/06/2022  Allergen Reaction Noted   Ivp dye [iodinated contrast media] Hives 03/26/2016   Lisinopril Cough 09/07/2021    Social History   Socioeconomic History   Marital status: Married    Spouse name: Not on file   Number of children: Not on file   Years of education: Not on file   Highest education level: Not on file  Occupational History   Not on file  Tobacco Use   Smoking status: Never   Smokeless tobacco: Never  Vaping Use   Vaping Use: Never used  Substance and Sexual Activity   Alcohol use: Never   Drug use: Never   Sexual activity: Not on file  Other Topics Concern   Not on file  Social History Narrative   Not on file   Social Determinants of Health   Financial Resource Strain: Not on file  Food Insecurity: Not on file  Transportation Needs: Not on file  Physical Activity: Not on file  Stress: Not on file  Social Connections: Not on file  Intimate Partner Violence: Not on file    Review of Systems: All systems reviewed and negative except where noted in HPI.  Physical Exam: Vital signs in last 24 hours: Temp:  [98 F (36.7 C)-98.4 F (36.9 C)] 98 F (36.7 C) (12/07 0500) Pulse Rate:  [63-75]  63 (12/07  1030) Resp:  [13-19] 14 (12/07 1030) BP: (131-188)/(58-99) 176/67 (12/07 1030) SpO2:  [97 %-100 %] 100 % (12/07 1030)    General:  Alert female in NAD Psych:  Pleasant, cooperative. Normal mood and affect Eyes: Pupils equal Ears:  Normal auditory acuity Nose: No deformity, discharge or lesions Neck:  Supple, no masses felt Lungs:  Clear to auscultation.  Heart:  Regular rate, regular rhythm.  Abdomen:  Soft, nondistended, nontender, active bowel sounds, no masses felt Rectal :  Deferred Msk: Symmetrical without gross deformities.  Neurologic:  Alert, oriented, grossly normal neurologically Extremities : No edema Skin:  jaundiced.    Intake/Output from previous day: No intake/output data recorded. Intake/Output this shift:  Total I/O In: 1680 [IV Piggyback:1680] Out: -     Principal Problem:   Bile duct obstruction Active Problems:   Essential hypertension   Gastroesophageal reflux disease   Mixed hyperlipidemia   Pancreatic carcinoma ([place])   Acute metabolic encephalopathy    Tye Savoy, NP-C @  05/07/2022, 10:53 AM

## 2022-05-07 NOTE — H&P (Signed)
History and Physical    Patient: Meagan [name] ZOX:[id number] DOB: February 22, 1935 DOA: 05/06/2022 DOS: the patient was seen and examined on 05/07/2022 PCP: Abigail Miyamoto, [place]  Patient coming from: Home  Chief Complaint:  Chief Complaint  Patient presents with   Jaundice   HPI: Meagan [name] is a 87 y.o. female with medical history significant of pancreatic carcinoma, HTN, GERD, HLD, vertigo. Presenting with jaundice and confusion for about a week. History is from niece by phone. Her family became concerned and took her to [facility] ED. At [facility] ED she had imaging that was concerning for possible blockage of her biliary stent. They called for transfer to [facility], but [facility] initially refused as they stated that there was no one to perform ERCPs this week. Per the niece, oncology reached out to [facility] (Dr. Louanna Raw) and apparently he would be available for ERCP if necessary. Thus, the patient came to [facility] for evaluation.     Review of Systems: As mentioned in the history of present illness. All other systems reviewed and are negative. Past Medical History:  Diagnosis Date   Essential tremor    GERD (gastroesophageal reflux disease)    High cholesterol    Hypertension    PONV (postoperative nausea and vomiting)    Port-A-Cath in place 01/27/2022   Vertigo    Past Surgical History:  Procedure Laterality Date   BILIARY BRUSHING  09/10/2021   Procedure: BILIARY BRUSHING;  Surgeon: Irene Shipper, MD;  Location: [facility] ENDOSCOPY;  Service: Gastroenterology;;   BILIARY BRUSHING  10/29/2021   Procedure: BILIARY BRUSHING;  Surgeon: Irving Copas., MD;  Location: [facility] ENDOSCOPY;  Service: Gastroenterology;;   BILIARY DILATION  10/29/2021   Procedure: BILIARY DILATION;  Surgeon: Irving Copas., MD;  Location: [facility] ENDOSCOPY;  Service: Gastroenterology;;   BILIARY STENT PLACEMENT  09/10/2021   Procedure: BILIARY STENT PLACEMENT;  Surgeon: Irene Shipper, MD;  Location: [facility] ENDOSCOPY;  Service:  Gastroenterology;;   BILIARY STENT PLACEMENT N/A 10/03/2021   Procedure: BILIARY STENT PLACEMENT;  Surgeon: Gatha Mayer, MD;  Location: [facility] ENDOSCOPY;  Service: Gastroenterology;  Laterality: N/A;   BILIARY STENT PLACEMENT N/A 10/29/2021   Procedure: BILIARY STENT PLACEMENT;  Surgeon: Rush Landmark Telford Nab., MD;  Location: [facility] ENDOSCOPY;  Service: Gastroenterology;  Laterality: N/A;   BIOPSY  10/29/2021   Procedure: BIOPSY;  Surgeon: Rush Landmark Telford Nab., MD;  Location: [facility] ENDOSCOPY;  Service: Gastroenterology;;   ENDOSCOPIC RETROGRADE CHOLANGIOPANCREATOGRAPHY (ERCP) WITH PROPOFOL N/A 10/29/2021   Procedure: ENDOSCOPIC RETROGRADE CHOLANGIOPANCREATOGRAPHY (ERCP) WITH PROPOFOL;  Surgeon: Irving Copas., MD;  Location: [facility] ENDOSCOPY;  Service: Gastroenterology;  Laterality: N/A;   ERCP N/A 09/10/2021   Procedure: ENDOSCOPIC RETROGRADE CHOLANGIOPANCREATOGRAPHY (ERCP);  Surgeon: Irene Shipper, MD;  Location: [place];  Service: Gastroenterology;  Laterality: N/A;   ERCP N/A 10/03/2021   Procedure: ENDOSCOPIC RETROGRADE CHOLANGIOPANCREATOGRAPHY (ERCP);  Surgeon: Gatha Mayer, MD;  Location: [facility] ENDOSCOPY;  Service: Gastroenterology;  Laterality: N/A;   ESOPHAGOGASTRODUODENOSCOPY (EGD) WITH PROPOFOL N/A 10/29/2021   Procedure: ESOPHAGOGASTRODUODENOSCOPY (EGD) WITH PROPOFOL;  Surgeon: Rush Landmark Telford Nab., MD;  Location: [facility] ENDOSCOPY;  Service: Gastroenterology;  Laterality: N/A;   EUS N/A 10/29/2021   Procedure: UPPER ENDOSCOPIC ULTRASOUND (EUS) RADIAL;  Surgeon: Irving Copas., MD;  Location: [facility] ENDOSCOPY;  Service: Gastroenterology;  Laterality: N/A;   FINE NEEDLE ASPIRATION  10/29/2021   Procedure: FINE NEEDLE ASPIRATION (FNA) LINEAR;  Surgeon: Irving Copas., MD;  Location: [facility] ENDOSCOPY;  Service: Gastroenterology;;   IR IMAGING GUIDED PORT INSERTION  01/28/2022   PACEMAKER IMPLANT     REMOVAL OF STONES  10/29/2021   Procedure: REMOVAL OF SLUDGE;  Surgeon: Rush Landmark  Telford Nab., MD;  Location: [facility] ENDOSCOPY;  Service: Gastroenterology;;   Joan Mayans  09/10/2021   Procedure: Joan Mayans;  Surgeon: Irene Shipper, MD;  Location[facility] ENDOSCOPY;  Service: Gastroenterology;;   Bess Kinds CHOLANGIOSCOPY N/A 10/29/2021   Procedure: PYKDXIPJ CHOLANGIOSCOPY;  Surgeon: Irving Copas., MD;  Location: [facility] ENDOSCOPY;  Service: Gastroenterology;  Laterality: N/A;   STENT REMOVAL  10/03/2021   Procedure: STENT REMOVAL;  Surgeon: Gatha Mayer, MD;  Location: [facility] ENDOSCOPY;  Service: Gastroenterology;;   Social History:  reports that she has never smoked. She has never used smokeless tobacco. She reports that she does not drink alcohol and does not use drugs.  Allergies  Allergen Reactions   Ivp Dye [Iodinated Contrast Media] Hives   Lisinopril Cough    Family History  Problem Relation Age of Onset   Stomach cancer Neg Hx    Esophageal cancer Neg Hx    Colon cancer Neg Hx    Pancreatic cancer Neg Hx     Prior to Admission medications   Medication Sig Start Date End Date Taking? Authorizing Provider  amLODipine (NORVASC) 2.5 MG tablet Take 2.5 mg by mouth at bedtime.   Yes [provider]  aspirin EC 81 MG tablet Take 81 mg by mouth daily. Swallow whole.   Yes [provider]  Cholecalciferol (VITAMIN D3) 50 MCG (2000 UT) TABS Take 2,000 Units by mouth daily after supper.   Yes [provider]  Coenzyme Q10 (CO Q-10) 100 MG CAPS Take 100 mg by mouth every evening.   Yes [provider]  famotidine (PEPCID) 40 MG tablet Take 40 mg by mouth every evening. 10/02/21  Yes [provider]  gabapentin (NEURONTIN) 100 MG capsule Take 100 mg by mouth 2 (two) times daily.   Yes [provider]  lidocaine-prilocaine (EMLA) cream Apply a small amount to port a cath site (do not rub in) and cover with plastic wrap 1 hour prior to infusion appointments 01/27/22  Yes Derek Jack, MD  meclizine (ANTIVERT) 25 MG  tablet Take 25 mg by mouth daily as needed for dizziness.   Yes [provider]  Misc Natural Products (JOINT SUPPORT) CAPS Take 1 capsule by mouth daily with breakfast.   Yes [provider]  Multiple Vitamin (MULTIVITAMIN) tablet Take 1 tablet by mouth daily with breakfast.   Yes [provider]  Multiple Vitamins-Minerals (Burtrum) CAPS Take 1 capsule by mouth daily.   Yes [provider]  nebivolol (BYSTOLIC) 5 MG tablet Take 2.5 mg by mouth in the morning.   Yes [provider]  omeprazole (PRILOSEC) 40 MG capsule Take 40 mg by mouth daily before breakfast.   Yes [provider]  polyethylene glycol (MIRALAX) 17 g packet Take 17 g by mouth daily as needed. Patient taking differently: Take 17 g by mouth daily as needed for moderate constipation. 09/11/21  Yes Shelly Coss, MD  Polyvinyl Alcohol-Povidone (REFRESH OP) Place 1 drop into both eyes daily as needed (tired eyes).   Yes [provider]  primidone (MYSOLINE) 50 MG tablet Take 25 mg by mouth daily as needed (for tremors).   Yes [provider]  prochlorperazine (COMPAZINE) 10 MG tablet Take 1 tablet (10 mg total) by mouth every 6 (six) hours as needed for nausea or vomiting. 01/27/22  Yes Derek Jack, MD  pyridOXINE (VITAMIN B-6) 100 MG tablet Take 50 mg by mouth in the morning.   Yes [provider]  tiZANidine (ZANAFLEX) 2 MG tablet Take 1 mg by mouth daily as needed for muscle spasms.   Yes [provider]    Physical Exam: Vitals:   05/07/22 0300 05/07/22 0330 05/07/22 0415 05/07/22 0500  BP: (!) 146/72 (!) 136/58 132/61 (!) 153/77  Pulse: 75 64 67 73  Resp: '14 15 14 19  '$ Temp:    98 F (36.7 C)  TempSrc:    Oral  SpO2: 99% 97% 98% 100%   General: 87 y.o. female resting in bed in NAD Eyes: PERRL, icteric sclera ENMT: Nares patent w/o discharge, orophaynx clear, dentition normal, ears w/o  discharge/lesions/ulcers; jaundice Neck: Supple, trachea midline Cardiovascular: RRR, +S1, S2, no m/g/r, equal pulses throughout Respiratory: CTABL, no w/r/r, normal WOB GI: BS+, NDNT, no masses noted, no organomegaly noted MSK: No e/c/c Neuro: A&O x 3, no focal deficits Psyc: Appropriate interaction and affect, calm/cooperative  Data Reviewed:  Results for orders placed or performed during the hospital encounter of 05/06/22 (from the past 24 hour(s))  CBC with Differential     Status: Abnormal   Collection Time: 05/06/22  4:42 PM  Result Value Ref Range   WBC 11.2 (H) 4.0 - 10.5 K/uL   RBC 3.74 (L) 3.87 - 5.11 MIL/uL   Hemoglobin 10.8 (L) 12.0 - 15.0 g/dL   HCT 33.6 (L) 36.0 - 46.0 %   MCV 89.8 80.0 - 100.0 fL   MCH 28.9 26.0 - 34.0 pg   MCHC 32.1 30.0 - 36.0 g/dL   RDW 17.3 (H) 11.5 - 15.5 %   Platelets 223 150 - 400 K/uL   nRBC 0.0 0.0 - 0.2 %   Neutrophils Relative % 61 %   Neutro Abs 6.8 1.7 - 7.7 K/uL   Lymphocytes Relative 17 %   Lymphs Abs 1.9 0.7 - 4.0 K/uL   Monocytes Relative 15 %   Monocytes Absolute 1.6 (H) 0.1 - 1.0 K/uL   Eosinophils Relative 2 %   Eosinophils Absolute 0.2 0.0 - 0.5 K/uL   Basophils Relative 1 %   Basophils Absolute 0.1 0.0 - 0.1 K/uL   Immature Granulocytes 4 %   Abs Immature Granulocytes 0.49 (H) 0.00 - 0.07 K/uL  Comprehensive metabolic panel     Status: Abnormal   Collection Time: 05/06/22  4:42 PM  Result Value Ref Range   Sodium 134 (L) 135 - 145 mmol/L   Potassium 3.6 3.5 - 5.1 mmol/L   Chloride 104 98 - 111 mmol/L   CO2 23 22 - 32 mmol/L   Glucose, Bld 137 (H) 70 - 99 mg/dL   BUN 25 (H) 8 - 23 mg/dL   Creatinine, Ser 0.92 0.44 - 1.00 mg/dL   Calcium 8.9 8.9 - 10.3 mg/dL   Total Protein 6.2 (L) 6.5 - 8.1 g/dL   Albumin 2.6 (L) 3.5 - 5.0 g/dL   AST 34 15 - 41 U/L   ALT 72 (H) 0 - 44 U/L   Alkaline Phosphatase 258 (H) 38 - 126 U/L   Total Bilirubin 4.8 (H) 0.3 - 1.2 mg/dL   GFR, Estimated >60 >60 mL/min   Anion gap 7 5 - 15   Lipase, blood     Status: Abnormal   Collection Time: 05/06/22  4:42 PM  Result Value Ref Range   Lipase 63 (H) 11 - 51 U/L  Urinalysis, Routine w reflex microscopic Urine, Clean  Catch     Status: Abnormal   Collection Time: 05/07/22  1:03 AM  Result Value Ref Range   Color, Urine AMBER (A) YELLOW   APPearance CLEAR CLEAR   Specific Gravity, Urine 1.021 1.005 - 1.030   pH 6.0 5.0 - 8.0   Glucose, UA NEGATIVE NEGATIVE mg/dL   Hgb urine dipstick NEGATIVE NEGATIVE   Bilirubin Urine SMALL (A) NEGATIVE   Ketones, ur NEGATIVE NEGATIVE mg/dL   Protein, ur 30 (A) NEGATIVE mg/dL   Nitrite NEGATIVE NEGATIVE   Leukocytes,Ua NEGATIVE NEGATIVE   RBC / HPF 21-50 0 - 5 RBC/hpf   WBC, UA 0-5 0 - 5 WBC/hpf   Bacteria, UA NONE SEEN NONE SEEN   Squamous Epithelial / LPF 0-5 0 - 5   Mucus PRESENT   Lactic acid, plasma     Status: None   Collection Time: 05/07/22  1:20 AM  Result Value Ref Range   Lactic Acid, Venous 1.1 0.5 - 1.9 mmol/L   [place] Ab RUQ 1. Common bile duct stent as above. There is mild persistent intrahepatic biliary duct dilation, with evidence of pneumobilia suggesting at least partial patency of the biliary stent. If further evaluation of the stent is desired, CT could be performed. 2. Gallbladder sludge, with no evidence of cholelithiasis or acute cholecystitis.  CT ab/pelvis: 1. There is some very mild intrahepatic biliary dilatation along with pneumobilia. In light of the increasing serum bilirubin levels, this could reflect very early or partial stent malfunction/occlusion. Portions of the expandable CBD stent are filled with fluid or soft tissue density. 2. The pancreatic tumor itself is poorly characterized on today's noncontrast CT exam. 3. Likely mild third say pacing of fluid with low-grade mesenteric, retroperitoneal, and subcutaneous edema along with trace bilateral pleural effusions. 4. Nonobstructive left nephrolithiasis. 5. Aortic Atherosclerosis  (ICD10-I70.0). Mild mitral valve calcification. 6. Mild multilevel lumbar impingement.  CXR: No active cardiopulmonary disease.   Assessment and Plan: Biliary obstruction Hx of pancreatic CA     - placed in obs, tele     - [facility] to see     - continue zosyn for now     - fluids     - keep NPO until eval'd by [facility]  Acute metabolic encephalopathy     - check ammonia level     - currently being treated for intra-abdominal infection w/ zosyn     - UA is negative  GERD     - PPI  HLD     - hold home regimen d/t elevated LFTs  HTN     - PRN hydralazine     - resume home regimen when off NPO status  Pancreatic carcinoma     - continue follow up with [facility]  Advance Care Planning:   Code Status: FULL  Consults: [facility]  Family Communication: w/ daughter by phone  Severity of Illness: The appropriate patient status for this patient is OBSERVATION. Observation status is judged to be reasonable and necessary in order to provide the required intensity of service to ensure the patient's safety. The patient's presenting symptoms, physical exam findings, and initial radiographic and laboratory data in the context of their medical condition is felt to place them at decreased risk for further clinical deterioration. Furthermore, it is anticipated that the patient will be medically stable for discharge from the hospital within 2 midnights of admission.   Author: Jonnie Finner, DO 05/07/2022 8:10 AM  For on call review [email or web address].

## 2022-05-07 NOTE — Progress Notes (Signed)
PHARMACY NOTE -  [facility] has been assisting with dosing of Zosyn for intra-abdominal infection. Dosage remains stable at 3.375 g IV q8 hr and further renal adjustments per institutional Pharmacy antibiotic protocol  Pharmacy will sign off, following peripherally for culture results or dose adjustments. Please reconsult if a change in clinical status warrants re-evaluation of dosage.  Reuel Boom, PharmD, BCPS [phone] 05/07/2022, 9:34 AM

## 2022-05-07 NOTE — H&P (View-Only) (Signed)
Consultation Note   Referring Provider:  [facility] PCP: Meagan [name], [place] [facility] Gastroenterologist: Meagan Shorts, MD  Reason for consultation: jaundice  Hospital Day: 2  Assessment    Patient profile:  Meagan [name] is a 87 y.o. female with a past medical history significant for pancreatic cancer, HTN, HLD, vertigo, GERD. See PMH for any additional medical problems. Admitted  # 87 yo female with pancreatic cancer s/p placement of uncovered metal biliary stent 10/29/21. Admitted with biliary obstruction / fever at home. CT scan showing that portions of the CBD stent are filled with fluid or soft tissue density . On antibiotics and leukocytosis has resolved. Bilirubin down from 12 to 4.9 and fevers resolved   # See PMH for additional medical problems  Plan   Continue Zosyn She will need ERCP to clean out metal stent. The benefits and risks of ERCP not limited to cardiopulmonary complications of sedation, bleeding, infection, perforation,and pancreatitis were discussed with the patient who agrees to proceed.   ERCP will not be done today. Will given heart healthy diet.  --------------------------------------------------------------------------------------------------------------------------------------------------------------------  GI attending:   I have also seen and evaluated this patient in person with history and physical exam and chart review and agree with the NP note.  Patient known to me previous ERCP, she has pancreatic cancer with biliary stricture treated with an uncovered metal stent in May now showing signs of stent occlusion and transient sepsis.  She is much better overall.  She needs a repeat ERCP with evaluation of the stent, may need to open this up by using a balloon or basket to remove debris versus placing additional stents.  We might be able to get this done tomorrow I am not sure.  Will hold her n.p.o.  after midnight and see if  we can get it scheduled.  If not we will most likely be Saturday unless her situation deteriorates and it becomes more urgent.   Meagan Mayer, MD, [facility] See Meagan [name] on call - gastroenterology for best contact person 05/07/2022 5:33 PM   HPI   Patient went to [facility] ED yesterday with fevers / weakness / altered mental status.  She didn't have any nausea / vomiting or abdominal pain. There was concern for blockage of biliary stent . CT scan with contrast moderate intrahepatic ductal dilation, pneumobilia, mild gallbladder distention, circumferential mucosal thickening of small bowel loops in mid abdomen and moderate constipation. She was transferred here for management. Non-contrast CT scan here shows very mild intrahepatic biliary dilatation along with pneumobilia. Portions of the expandable CBD stent are filled with fluid or soft tissue density  Meagan [name] feels better today. Her bilirubin is down from 12.5 at [facility] to 4.9 today. She has no urinary symptoms. She does have a dry cough since having COVID last month. She has been eating and drinking at home okay.   Significant studies:    WBC 11.2 > 9.6 today Hgb stable at 9.5 Today's labs: alk phos 247, Tbili 4.9 ( normal on 11/2),  AST 37 / ALT 59   Previous GI Evaluation     April 2023 ERCP -Indeterminate mid common bile duct stricture as described. Status post brushings. Status post stent placement FINAL MICROSCOPIC DIAGNOSIS:  -  No malignant cells identified    10/03/21 ERCP -A single biliary stricture was found in the middle third of the main bile duct. The stricture was malignant appearing. This stricture was treated with stent replacement 10 Fr 7 cm. The original stent had migtrated slightly and I think the proximal aspect was clogged though not certain. The proximal side flange was patent, though.   10/29/21 ERCP -Prior biliary sphincterotomy appeared open. - One visibly patent stent  from the biliary tree was seen in the major papilla - removed and sent for cytology. - A single severe biliary stricture was found in the lower third of the main bile duct. The stricture was malignant appearing. - The upper third of the main bile duct and middle third of the main bile duct were moderately dilated, secondary to aforementioned stricture. - The biliary tree was swept and sludge was found. - Cells for cytology obtained in the lower third of the main duct stricture x 2. - Cholangioscopy performed and could not traverse the stricture. Spybite biopsies performed. - As we received preliminary cytology results of malignancy, we transitioned and placed one uncovered metal biliary stent was placed into the common bile duct. The stent was dilated to allow for further stent expansion.   10/29/21 EUS EGD Impression: - J-shaped deformity of stomach. - No gross lesions in the duodenal bulb, in the first portion of the duodenum and in the second portion of the duodenum. - Previous biliary stent had been removed with patent sphinterotomy present. EUS Impression: - A mass-like area/region was identified in the superior pancreatic head. Cytology results are pending. However, the endosonographic appearance is suggestive of adenocarcinoma. This was staged T2 N0 M1 (based on endosonographic criteria of metastasis to the liver - tissue was not obtained due to blood vessel being in the way). The staging applies if malignancy is confirmed. Fine needle biopsy performed. - There was dilation in the common hepatic duct with sludge. - Hyperechoic material consistent with sludge was visualized endosonographically in the gallbladder. - A lesion was found in the visualized portion of the liver. The lesion was hypoechoic not anechoic and is concerning for a possible liver metastasis rather than cyst (but could still end up being). The lesion measured 13 mm by 10 mm. - No malignant-appearing lymph nodes were  visualized in the celiac region (level 20), peripancreatic region and porta hepatis region.  A. BILIARY STRICTURE, BIOPSY:  - Minute fragment of biliary mucosa with reactive changes  - Negative for dysplasia or malignancy   FINAL MICROSCOPIC DIAGNOSIS:   A. BILIARY, STENT REMOVAL:  - Atypical cells present   B. PANCREAS, HEAD, FINE NEEDLE ASPIRATION:  - Malignant cells consistent with adenocarcinoma   C. BILIARY, STRICTURE, BRUSHING:  - Malignant cells consistent with adenocarcinoma    Recent Labs and Imaging DG Chest Port 1 View  Result Date: 05/07/2022 CLINICAL DATA:  Questionable sepsis - evaluate for abnormality. EXAM: PORTABLE CHEST 1 VIEW COMPARISON:  09/08/2021 FINDINGS: Right Port-A-Cath in place with the tip at the cavoatrial junction. Left pacer remains in place, unchanged. Heart and mediastinal contours are within normal limits. No focal opacities or effusions. No acute bony abnormality. Aortic atherosclerosis. IMPRESSION: No active cardiopulmonary disease. Electronically Signed   By: Rolm Baptise M.D.   On: 05/07/2022 02:06   CT ABDOMEN PELVIS WO CONTRAST  Result Date: 05/06/2022 CLINICAL DATA:  Pancreatic cancer with CBD stent. Abdominal pain and jaundice. * Tracking Code: BO * EXAM: CT ABDOMEN AND PELVIS WITHOUT CONTRAST TECHNIQUE:  Multidetector CT imaging of the abdomen and pelvis was performed following the standard protocol without IV contrast. RADIATION DOSE REDUCTION: This exam was performed according to the departmental dose-optimization program which includes automated exposure control, adjustment of the mA and/or kV according to patient size and/or use of iterative reconstruction technique. COMPARISON:  Abdominal ultrasound 05/06/2022; CT abdomen 05/08/2022 FINDINGS: Lower chest: Mild scarring in the right lower lobe and mild subsegmental atelectasis in the left lower lobe with trace bilateral pleural effusions. Pacer leads noted. Mild mitral valve calcification.  Descending thoracic aortic atherosclerotic vascular calcification. Hepatobiliary: Pneumobilia noted favoring patency of the stent although components of the intrahepatic biliary tree are slightly more gas distended than they were on 01/16/2022. Portions of the stent or filled with intermediate density which is probably fluid but technically nonspecific. Portions the right hepatic lobe have mild intrahepatic biliary dilatation. Contracted gallbladder. Pancreas: The pancreatic head mass is poorly characterized on today's noncontrast examination. No substantial dorsal pancreatic duct dilatation. Spleen: Unremarkable Adrenals/Urinary Tract: Stable benign cyst exophytic from the right kidney lower pole. No further imaging workup of this lesion is indicated. Three punctate nonobstructive left renal calculi are present. No hydronephrosis or hydroureter. Urinary bladder unremarkable. Adrenal glands unremarkable. Stomach/Bowel: Unremarkable Vascular/Lymphatic: Atherosclerosis is present, including aortoiliac atherosclerotic disease. Reproductive: Unremarkable Other: Subtle edema in the perirenal spaces and mesentery as well as the subcutaneous tissues potentially from third spacing of fluid. Musculoskeletal: Lumbar spondylosis and degenerative disc disease causing mild multilevel impingement. IMPRESSION: 1. There is some very mild intrahepatic biliary dilatation along with pneumobilia. In light of the increasing serum bilirubin levels, this could reflect very early or partial stent malfunction/occlusion. Portions of the expandable CBD stent are filled with fluid or soft tissue density. 2. The pancreatic tumor itself is poorly characterized on today's noncontrast CT exam. 3. Likely mild third say pacing of fluid with low-grade mesenteric, retroperitoneal, and subcutaneous edema along with trace bilateral pleural effusions. 4. Nonobstructive left nephrolithiasis. 5. Aortic Atherosclerosis (ICD10-I70.0). Mild mitral valve  calcification. 6. Mild multilevel lumbar impingement. Electronically Signed   By: Van Clines M.D.   On: 05/06/2022 20:12   US Abdomen Limited RUQ (LIVER/GB)  Result Date: 05/06/2022 CLINICAL DATA:  Jaundice, history of common bile duct stent, pancreatic cancer EXAM: ULTRASOUND ABDOMEN LIMITED RIGHT UPPER QUADRANT COMPARISON:  01/16/2022 FINDINGS: Gallbladder: Gallbladder is moderately distended, with no evidence of cholelithiasis. Minimal echogenic sludge is identified. Borderline gallbladder wall thickening measuring 3 mm. Negative sonographic Murphy sign. No free fluid surrounding the gallbladder. Common bile duct: Diameter: Common bile duct stent is identified. The common bile duct measures up to 8 mm in diameter. Liver: Liver demonstrates normal echotexture. No focal lesion is identified. There is mild intrahepatic biliary duct dilation, not appreciably changed from prior CT. Ring down artifact consistent with pneumobilia, consistent with at least partial patency of the common bile duct stent. Portal vein is patent on color Doppler imaging with normal direction of blood flow towards the liver. Other: Stable exophytic simple 6 cm right renal cyst does not require follow-up. IMPRESSION: 1. Common bile duct stent as above. There is mild persistent intrahepatic biliary duct dilation, with evidence of pneumobilia suggesting at least partial patency of the biliary stent. If further evaluation of the stent is desired, CT could be performed. 2. Gallbladder sludge, with no evidence of cholelithiasis or acute cholecystitis. Electronically Signed   By: Randa Ngo M.D.   On: 05/06/2022 19:27    Labs:  Recent Labs    05/06/22 1642 05/07/22  0930  WBC 11.2* 9.6  HGB 10.8* 9.5*  HCT 33.6* 29.5*  PLT 223 210   Recent Labs    05/06/22 1642 05/07/22 0930  NA 134* 136  K 3.6 3.8  CL 104 106  CO2 23 23  GLUCOSE 137* 92  BUN 25* 18  CREATININE 0.92 0.79  CALCIUM 8.9 9.3   Recent Labs     05/07/22 0930  PROT 5.5*  ALBUMIN 2.3*  AST 37  ALT 59*  ALKPHOS 247*  BILITOT 4.9*   No results for input(s): "HEPBSAG", "HCVAB", "HEPAIGM", "HEPBIGM" in the last 72 hours. No results for input(s): "LABPROT", "INR" in the last 72 hours.  Past Medical History:  Diagnosis Date   Essential tremor    GERD (gastroesophageal reflux disease)    High cholesterol    Hypertension    PONV (postoperative nausea and vomiting)    Port-A-Cath in place 01/27/2022   Vertigo     Past Surgical History:  Procedure Laterality Date   BILIARY BRUSHING  09/10/2021   Procedure: BILIARY BRUSHING;  Surgeon: Irene Shipper, MD;  Location: [facility] ENDOSCOPY;  Service: Gastroenterology;;   BILIARY BRUSHING  10/29/2021   Procedure: BILIARY BRUSHING;  Surgeon: Irving Copas., MD;  Location: [facility] ENDOSCOPY;  Service: Gastroenterology;;   BILIARY DILATION  10/29/2021   Procedure: BILIARY DILATION;  Surgeon: Irving Copas., MD;  Location: [facility] ENDOSCOPY;  Service: Gastroenterology;;   BILIARY STENT PLACEMENT  09/10/2021   Procedure: BILIARY STENT PLACEMENT;  Surgeon: Irene Shipper, MD;  Location: [facility] ENDOSCOPY;  Service: Gastroenterology;;   BILIARY STENT PLACEMENT N/A 10/03/2021   Procedure: BILIARY STENT PLACEMENT;  Surgeon: Meagan Mayer, MD;  Location: [facility] ENDOSCOPY;  Service: Gastroenterology;  Laterality: N/A;   BILIARY STENT PLACEMENT N/A 10/29/2021   Procedure: BILIARY STENT PLACEMENT;  Surgeon: Rush Landmark Telford Nab., MD;  Location: [facility] ENDOSCOPY;  Service: Gastroenterology;  Laterality: N/A;   BIOPSY  10/29/2021   Procedure: BIOPSY;  Surgeon: Rush Landmark Telford Nab., MD;  Location: [facility] ENDOSCOPY;  Service: Gastroenterology;;   ENDOSCOPIC RETROGRADE CHOLANGIOPANCREATOGRAPHY (ERCP) WITH PROPOFOL N/A 10/29/2021   Procedure: ENDOSCOPIC RETROGRADE CHOLANGIOPANCREATOGRAPHY (ERCP) WITH PROPOFOL;  Surgeon: Irving Copas., MD;  Location: [facility] ENDOSCOPY;  Service: Gastroenterology;  Laterality: N/A;    ERCP N/A 09/10/2021   Procedure: ENDOSCOPIC RETROGRADE CHOLANGIOPANCREATOGRAPHY (ERCP);  Surgeon: Irene Shipper, MD;  Location: [facility];  Service: Gastroenterology;  Laterality: N/A;   ERCP N/A 10/03/2021   Procedure: ENDOSCOPIC RETROGRADE CHOLANGIOPANCREATOGRAPHY (ERCP);  Surgeon: Meagan Mayer, MD;  Location: [facility] ENDOS[facility];  Service: Gastroenterology;  Laterality: N/A;   ESOPHAGOGASTRODUODENOSCOPY (EGD) WITH PROPOFOL N/A 10/29/2021   Procedure: ESOPHAGOGASTRODUODENOSCOPY (EGD) WITH PROPOFOL;  Surgeon: Rush Landmark Telford Nab., MD;  Location: [facility] ENDOSCOPY;  Service: Gastroenterology;  Laterality: N/A;   EUS N/A 10/29/2021   Procedure: UPPER ENDOSCOPIC ULTRASOUND (EUS) RADIAL;  Surgeon: Irving Copas., MD;  Location: [facility] ENDOSCOPY;  Service: Gastroenterology;  Laterality: N/A;   FINE NEEDLE ASPIRATION  10/29/2021   Procedure: FINE NEEDLE ASPIRATION (FNA) LINEAR;  Surgeon: Irving Copas., MD;  Location: [facility] ENDOSCOPY;  Service: Gastroenterology;;   IR IMAGING GUIDED PORT INSERTION  01/28/2022   PACEMAKER IMPLANT     REMOVAL OF STONES  10/29/2021   Procedure: REMOVAL OF SLUDGE;  Surgeon: Irving Copas., MD;  Location: [facility] ENDOS[facility];  Service: Gastroenterology;;   Joan Mayans  09/10/2021   Procedure: Joan Mayans;  Surgeon: Irene Shipper, MD;  Location: [facility] ENDOSCOPY;  Service: Gastroenterology;;   Bess Kinds CHOLANGIOSCOPY N/A 10/29/2021   Procedure:  SPYGLASS CHOLANGIOSCOPY;  Surgeon: Rush Landmark Telford Nab., MD;  Location: [facility];  Service: Gastroenterology;  Laterality: N/A;   STENT REMOVAL  10/03/2021   Procedure: STENT REMOVAL;  Surgeon: Meagan Mayer, MD;  Location: [facility] ENDOSCOPY;  Service: Gastroenterology;;    Family History  Problem Relation Age of Onset   Stomach cancer Neg Hx    Esophageal cancer Neg Hx    Colon cancer Neg Hx    Pancreatic cancer Neg Hx     Prior to Admission medications   Medication Sig Start Date End Date Taking? Authorizing  Provider  amLODipine (NORVASC) 2.5 MG tablet Take 2.5 mg by mouth at bedtime.   Yes [provider]  aspirin EC 81 MG tablet Take 81 mg by mouth daily. Swallow whole.   Yes [provider]  Cholecalciferol (VITAMIN D3) 50 MCG (2000 UT) TABS Take 2,000 Units by mouth daily after supper.   Yes [provider]  Coenzyme Q10 (CO Q-10) 100 MG CAPS Take 100 mg by mouth every evening.   Yes [provider]  famotidine (PEPCID) 40 MG tablet Take 40 mg by mouth every evening. 10/02/21  Yes [provider]  gabapentin (NEURONTIN) 100 MG capsule Take 100 mg by mouth 2 (two) times daily.   Yes [provider]  lidocaine-prilocaine (EMLA) cream Apply a small amount to port a cath site (do not rub in) and cover with plastic wrap 1 hour prior to infusion appointments 01/27/22  Yes Derek Jack, MD  meclizine (ANTIVERT) 25 MG tablet Take 25 mg by mouth daily as needed for dizziness.   Yes [provider]  Misc Natural Products (JOINT SUPPORT) CAPS Take 1 capsule by mouth daily with breakfast.   Yes [provider]  Multiple Vitamin (MULTIVITAMIN) tablet Take 1 tablet by mouth daily with breakfast.   Yes [provider]  Multiple Vitamins-Minerals ([place]) CAPS Take 1 capsule by mouth daily.   Yes [provider]  nebivolol (BYSTOLIC) 5 MG tablet Take 2.5 mg by mouth in the morning.   Yes [provider]  omeprazole (PRILOSEC) 40 MG capsule Take 40 mg by mouth daily before breakfast.   Yes [provider]  polyethylene glycol (MIRALAX) 17 g packet Take 17 g by mouth daily as needed. Patient taking differently: Take 17 g by mouth daily as needed for moderate constipation. 09/11/21  Yes Shelly Coss, MD  Polyvinyl Alcohol-Povidone (REFRESH OP) Place 1 drop into both eyes daily as needed (tired eyes).   Yes [provider]  primidone (MYSOLINE) 50 MG tablet Take 25 mg by mouth  daily as needed (for tremors).   Yes [provider]  prochlorperazine (COMPAZINE) 10 MG tablet Take 1 tablet (10 mg total) by mouth every 6 (six) hours as needed for nausea or vomiting. 01/27/22  Yes Derek Jack, MD  pyridOXINE (VITAMIN B-6) 100 MG tablet Take 50 mg by mouth in the morning.   Yes [provider]  tiZANidine (ZANAFLEX) 2 MG tablet Take 1 mg by mouth daily as needed for muscle spasms.   Yes [provider]    Current Facility-Administered Medications  Medication Dose Route Frequency Provider Last Rate Last Admin   piperacillin-tazobactam (ZOSYN) IVPB 3.375 g  3.375 g Intravenous Q8H Wofford, Drew A, RPH 12.5 mL/hr at 05/07/22 1021 3.375 g at 05/07/22 1021   Current Outpatient Medications  Medication Sig Dispense Refill   amLODipine (NORVASC) 2.5 MG tablet Take 2.5 mg by mouth at bedtime.  aspirin EC 81 MG tablet Take 81 mg by mouth daily. Swallow whole.     Cholecalciferol (VITAMIN D3) 50 MCG (2000 UT) TABS Take 2,000 Units by mouth daily after supper.     Coenzyme Q10 (CO Q-10) 100 MG CAPS Take 100 mg by mouth every evening.     famotidine (PEPCID) 40 MG tablet Take 40 mg by mouth every evening.     gabapentin (NEURONTIN) 100 MG capsule Take 100 mg by mouth 2 (two) times daily.     lidocaine-prilocaine (EMLA) cream Apply a small amount to port a cath site (do not rub in) and cover with plastic wrap 1 hour prior to infusion appointments 30 g 3   meclizine (ANTIVERT) 25 MG tablet Take 25 mg by mouth daily as needed for dizziness.     Misc Natural Products (JOINT SUPPORT) CAPS Take 1 capsule by mouth daily with breakfast.     Multiple Vitamin (MULTIVITAMIN) tablet Take 1 tablet by mouth daily with breakfast.     Multiple Vitamins-Minerals (OCUVITE EYE HEALTH FORMULA) CAPS Take 1 capsule by mouth daily.     nebivolol (BYSTOLIC) 5 MG tablet Take 2.5 mg by mouth in the morning.     omeprazole (PRILOSEC) 40 MG capsule Take 40 mg by mouth daily  before breakfast.     polyethylene glycol (MIRALAX) 17 g packet Take 17 g by mouth daily as needed. (Patient taking differently: Take 17 g by mouth daily as needed for moderate constipation.) 14 each 0   Polyvinyl Alcohol-Povidone (REFRESH OP) Place 1 drop into both eyes daily as needed (tired eyes).     primidone (MYSOLINE) 50 MG tablet Take 25 mg by mouth daily as needed (for tremors).     prochlorperazine (COMPAZINE) 10 MG tablet Take 1 tablet (10 mg total) by mouth every 6 (six) hours as needed for nausea or vomiting. 60 tablet 6   pyridOXINE (VITAMIN B-6) 100 MG tablet Take 50 mg by mouth in the morning.     tiZANidine (ZANAFLEX) 2 MG tablet Take 1 mg by mouth daily as needed for muscle spasms.      Allergies as of 05/06/2022 - Review Complete 05/06/2022  Allergen Reaction Noted   Ivp dye [iodinated contrast media] Hives 03/26/2016   Lisinopril Cough 09/07/2021    Social History   Socioeconomic History   Marital status: Married    Spouse name: Not on file   Number of children: Not on file   Years of education: Not on file   Highest education level: Not on file  Occupational History   Not on file  Tobacco Use   Smoking status: Never   Smokeless tobacco: Never  Vaping Use   Vaping Use: Never used  Substance and Sexual Activity   Alcohol use: Never   Drug use: Never   Sexual activity: Not on file  Other Topics Concern   Not on file  Social History Narrative   Not on file   Social Determinants of Health   Financial Resource Strain: Not on file  Food Insecurity: Not on file  Transportation Needs: Not on file  Physical Activity: Not on file  Stress: Not on file  Social Connections: Not on file  Intimate Partner Violence: Not on file    Review of Systems: All systems reviewed and negative except where noted in HPI.  Physical Exam: Vital signs in last 24 hours: Temp:  [98 F (36.7 C)-98.4 F (36.9 C)] 98 F (36.7 C) (12/07 0500) Pulse Rate:  [63-75]  63 (12/07  1030) Resp:  [13-19] 14 (12/07 1030) BP: (131-188)/(58-99) 176/67 (12/07 1030) SpO2:  [97 %-100 %] 100 % (12/07 1030)    General:  Alert female in NAD Psych:  Pleasant, cooperative. Normal mood and affect Eyes: Pupils equal Ears:  Normal auditory acuity Nose: No deformity, discharge or lesions Neck:  Supple, no masses felt Lungs:  Clear to auscultation.  Heart:  Regular rate, regular rhythm.  Abdomen:  Soft, nondistended, nontender, active bowel sounds, no masses felt Rectal :  Deferred Msk: Symmetrical without gross deformities.  Neurologic:  Alert, oriented, grossly normal neurologically Extremities : No edema Skin:  jaundiced.    Intake/Output from previous day: No intake/output data recorded. Intake/Output this shift:  Total I/O In: 1680 [IV Piggyback:1680] Out: -     Principal Problem:   Bile duct obstruction Active Problems:   Essential hypertension   Gastroesophageal reflux disease   Mixed hyperlipidemia   Pancreatic carcinoma ([place])   Acute metabolic encephalopathy    Tye Savoy, NP-C @  05/07/2022, 10:53 AM

## 2022-05-07 NOTE — ED Notes (Signed)
Pt ambulated to the restroom.

## 2022-05-08 ENCOUNTER — Observation Stay (HOSPITAL_COMMUNITY): Payer: Medicare HMO | Admitting: Anesthesiology

## 2022-05-08 ENCOUNTER — Encounter (HOSPITAL_COMMUNITY)
Admission: EM | Disposition: A | Discharge status: Home / Self Care | Payer: Self-pay | Source: Home / Self Care | Attending: Internal Medicine

## 2022-05-08 ENCOUNTER — Observation Stay (HOSPITAL_COMMUNITY): Payer: Medicare HMO

## 2022-05-08 ENCOUNTER — Encounter (HOSPITAL_COMMUNITY): Payer: Self-pay | Admitting: Internal Medicine

## 2022-05-08 DIAGNOSIS — G9341 Metabolic encephalopathy: Secondary | ICD-10-CM

## 2022-05-08 DIAGNOSIS — K831 Obstruction of bile duct: Secondary | ICD-10-CM

## 2022-05-08 DIAGNOSIS — C259 Malignant neoplasm of pancreas, unspecified: Secondary | ICD-10-CM

## 2022-05-08 DIAGNOSIS — C250 Malignant neoplasm of head of pancreas: Secondary | ICD-10-CM

## 2022-05-08 DIAGNOSIS — I10 Essential (primary) hypertension: Secondary | ICD-10-CM | POA: Diagnosis not present

## 2022-05-08 DIAGNOSIS — E782 Mixed hyperlipidemia: Secondary | ICD-10-CM

## 2022-05-08 DIAGNOSIS — K219 Gastro-esophageal reflux disease without esophagitis: Secondary | ICD-10-CM | POA: Diagnosis not present

## 2022-05-08 HISTORY — PX: ERCP: SHX5425

## 2022-05-08 HISTORY — PX: BILIARY STENT PLACEMENT: SHX5538

## 2022-05-08 LAB — COMPREHENSIVE METABOLIC PANEL
ALT: 54 U/L — ABNORMAL HIGH (ref 0–44)
AST: 34 U/L (ref 15–41)
Albumin: 2.1 g/dL — ABNORMAL LOW (ref 3.5–5.0)
Alkaline Phosphatase: 216 U/L — ABNORMAL HIGH (ref 38–126)
Anion gap: 6 (ref 5–15)
BUN: 19 mg/dL (ref 8–23)
CO2: 25 mmol/L (ref 22–32)
Calcium: 8.7 mg/dL — ABNORMAL LOW (ref 8.9–10.3)
Chloride: 102 mmol/L (ref 98–111)
Creatinine, Ser: 0.96 mg/dL (ref 0.44–1.00)
GFR, Estimated: 58 mL/min — ABNORMAL LOW (ref >60)
Glucose, Bld: 93 mg/dL (ref 70–99)
Potassium: 3.6 mmol/L (ref 3.5–5.1)
Sodium: 133 mmol/L — ABNORMAL LOW (ref 135–145)
Total Bilirubin: 3.5 mg/dL — ABNORMAL HIGH (ref 0.3–1.2)
Total Protein: 5.1 g/dL — ABNORMAL LOW (ref 6.5–8.1)

## 2022-05-08 LAB — CBC
HCT: 30.1 % — ABNORMAL LOW (ref 36.0–46.0)
Hemoglobin: 9.7 g/dL — ABNORMAL LOW (ref 12.0–15.0)
MCH: 28.8 pg (ref 26.0–34.0)
MCHC: 32.2 g/dL (ref 30.0–36.0)
MCV: 89.3 fL (ref 80.0–100.0)
Platelets: 254 10*3/uL (ref 150–400)
RBC: 3.37 MIL/uL — ABNORMAL LOW (ref 3.87–5.11)
RDW: 17.3 % — ABNORMAL HIGH (ref 11.5–15.5)
WBC: 9.8 10*3/uL (ref 4.0–10.5)
nRBC: 0 % (ref 0.0–0.2)

## 2022-05-08 SURGERY — ERCP, WITH INTERVENTION IF INDICATED
Anesthesia: General

## 2022-05-08 MED ORDER — FENTANYL CITRATE (PF) 100 MCG/2ML IJ SOLN
INTRAMUSCULAR | Status: DC | PRN
  Administered 2022-05-08 (×2): 50 ug via INTRAVENOUS

## 2022-05-08 MED ORDER — LACTATED RINGERS IV SOLN
INTRAVENOUS | Status: AC | PRN
  Administered 2022-05-08: 1000 mL via INTRAVENOUS

## 2022-05-08 MED ORDER — FENTANYL CITRATE (PF) 100 MCG/2ML IJ SOLN
INTRAMUSCULAR | Status: AC
  Filled 2022-05-08: qty 2

## 2022-05-08 MED ORDER — ROCURONIUM BROMIDE 100 MG/10ML IV SOLN
INTRAVENOUS | Status: DC | PRN
  Administered 2022-05-08: 50 mg via INTRAVENOUS

## 2022-05-08 MED ORDER — SUGAMMADEX SODIUM 500 MG/5ML IV SOLN
INTRAVENOUS | Status: DC | PRN
  Administered 2022-05-08: 200 mg via INTRAVENOUS

## 2022-05-08 MED ORDER — SODIUM CHLORIDE 0.9 % IV SOLN
INTRAVENOUS | Status: DC | PRN
  Administered 2022-05-08: 20 mL

## 2022-05-08 MED ORDER — DEXAMETHASONE SODIUM PHOSPHATE 10 MG/ML IJ SOLN
INTRAMUSCULAR | Status: DC | PRN
  Administered 2022-05-08: 4 mg via INTRAVENOUS

## 2022-05-08 MED ORDER — ONDANSETRON HCL 4 MG/2ML IJ SOLN
INTRAMUSCULAR | Status: DC | PRN
  Administered 2022-05-08: 4 mg via INTRAVENOUS

## 2022-05-08 MED ORDER — LIDOCAINE HCL (CARDIAC) PF 100 MG/5ML IV SOSY
PREFILLED_SYRINGE | INTRAVENOUS | Status: DC | PRN
  Administered 2022-05-08: 50 mg via INTRAVENOUS

## 2022-05-08 MED ORDER — PHENYLEPHRINE HCL (PRESSORS) 10 MG/ML IV SOLN
INTRAVENOUS | Status: DC | PRN
  Administered 2022-05-08 (×2): 80 ug via INTRAVENOUS

## 2022-05-08 MED ORDER — PROPOFOL 10 MG/ML IV BOLUS
INTRAVENOUS | Status: DC | PRN
  Administered 2022-05-08: 120 mg via INTRAVENOUS

## 2022-05-08 MED ORDER — DICLOFENAC SUPPOSITORY 100 MG
RECTAL | Status: AC
  Filled 2022-05-08: qty 1

## 2022-05-08 MED ORDER — GLUCAGON HCL RDNA (DIAGNOSTIC) 1 MG IJ SOLR
INTRAMUSCULAR | Status: AC
  Filled 2022-05-08: qty 1

## 2022-05-08 NOTE — Interval H&P Note (Signed)
History and Physical Interval Note:  05/08/2022 3:28 PM  Meagan [name]  has presented today for surgery, with the diagnosis of biliary stent occluison.  The various methods of treatment have been discussed with the patient and family. After consideration of risks, benefits and other options for treatment, the patient has consented to  Procedure(s): ENDOSCOPIC RETROGRADE CHOLANGIOPANCREATOGRAPHY (ERCP) (N/A) as a surgical intervention.  The patient's history has been reviewed, patient examined, no change in status, stable for surgery.  I have reviewed the patient's chart and labs.  Questions were answered to the patient's satisfaction.     Silvano Rusk

## 2022-05-08 NOTE — Transfer of Care (Signed)
Immediate Anesthesia Transfer of Care Note  Patient: Nichola Sizer  Procedure(s) Performed: ENDOSCOPIC RETROGRADE CHOLANGIOPANCREATOGRAPHY (ERCP)  Patient Location: Short Stay  Anesthesia Type:General  Level of Consciousness: awake, alert , oriented, and patient cooperative  Airway & Oxygen Therapy: Patient Spontanous Breathing and Patient connected to face mask oxygen  Post-op Assessment: Report given to RN, Post -op Vital signs reviewed and stable, and Patient moving all extremities X 4  Post vital signs: Reviewed and stable  Last Vitals:  Vitals Value Taken Time  BP 167/71 05/08/22 1622  Temp 36.9 C 05/08/22 1622  Pulse 79 05/08/22 1622  Resp 18 05/08/22 1622  SpO2 99 % 05/08/22 1622    Last Pain:  Vitals:   05/08/22 1622  TempSrc: Temporal  PainSc: 0-No pain         Complications: No notable events documented.

## 2022-05-08 NOTE — Progress Notes (Signed)
  Transition of Care [facility]) Screening Note   Patient Details  Name: Meagan [name] Date of Birth: 12/24/1934   Transition of Care [facility]) CM/SW Contact:    Dessa Phi, RN Phone Number: 05/08/2022, 11:21 AM    Transition of Care Department [facility]) has reviewed patient and no TOC needs have been identified at this time. We will continue to monitor patient advancement through interdisciplinary progression rounds. If new patient transition needs arise, please place a TOC consult.

## 2022-05-08 NOTE — Anesthesia Preprocedure Evaluation (Signed)
Anesthesia Evaluation  Patient identified by MRN, date of birth, ID band Patient awake    Reviewed: Allergy & Precautions, H&P , NPO status , Patient's Chart, lab work & pertinent test results  History of Anesthesia Complications (+) PONV and history of anesthetic complications  Airway Mallampati: II   Neck ROM: full    Dental   Pulmonary neg pulmonary ROS   breath sounds clear to auscultation       Cardiovascular hypertension,  Rhythm:regular Rate:Normal     Neuro/Psych    GI/Hepatic ,GERD  ,,  Endo/Other    Renal/GU      Musculoskeletal   Abdominal   Peds  Hematology   Anesthesia Other Findings   Reproductive/Obstetrics                             Anesthesia Physical Anesthesia Plan  ASA: 3  Anesthesia Plan: General   Post-op Pain Management:    Induction: Intravenous  PONV Risk Score and Plan: 4 or greater and Ondansetron, Dexamethasone and Treatment may vary due to age or medical condition  Airway Management Planned: Oral ETT  Additional Equipment:   Intra-op Plan:   Post-operative Plan: Extubation in OR  Informed Consent: I have reviewed the patients History and Physical, chart, labs and discussed the procedure including the risks, benefits and alternatives for the proposed anesthesia with the patient or authorized representative who has indicated his/her understanding and acceptance.     Dental advisory given  Plan Discussed with: CRNA, Anesthesiologist and Surgeon  Anesthesia Plan Comments:        Anesthesia Quick Evaluation

## 2022-05-08 NOTE — Anesthesia Procedure Notes (Signed)
Procedure Name: Intubation Date/Time: 05/08/2022 3:35 PM  Performed by: Jonna Munro, CRNAPre-anesthesia Checklist: Patient identified, Emergency Drugs available, Suction available, Patient being monitored and Timeout performed Patient Re-evaluated:Patient Re-evaluated prior to induction Oxygen Delivery Method: Circle system utilized Preoxygenation: Pre-oxygenation with 100% oxygen Induction Type: IV induction Ventilation: Mask ventilation without difficulty Laryngoscope Size: Mac and 3 Grade View: Grade I Tube type: Oral Tube size: 7.0 mm Number of attempts: 1 Airway Equipment and Method: Stylet Placement Confirmation: ETT inserted through vocal cords under direct vision, positive ETCO2, CO2 detector and breath sounds checked- equal and bilateral Secured at: 22 cm Tube secured with: Tape Dental Injury: Teeth and Oropharynx as per pre-operative assessment

## 2022-05-08 NOTE — Progress Notes (Signed)
PROGRESS NOTE    Meagan [name]  KGY:[id number] DOB: Jul 07, 1934 DOA: 05/06/2022 PCP: Abigail Miyamoto, [name]    Chief Complaint  Patient presents with   Jaundice    Brief Narrative:  Patient is 87 year old female history of hypertension, hyperlipidemia, vertigo, GERD, pancreatic cancer status post placement of uncovered metal biliary stent 10/29/2021 presenting to the ED with jaundice and confusion x 1 week with some associated fevers.  Family took patient to sober ED, imaging done concerning for possible blockage of biliary stent and patient called for transfer to [facility].  Transfer initially refused as specialist not available to perform ERCP however oncology reached out to GI, [facility] and apparently specialist will be available for ERCP if necessary and patient subsequently presented to [facility] long ED.  CT done showing portions of CBD stent filled with fluid or soft tissue density.  Patient noted to have a leukocytosis and elevated bilirubin.  Patient placed empirically on IV antibiotics.  GI consulted.   Assessment & Plan:   Principal Problem:   Bile duct obstruction Active Problems:   Essential hypertension   Gastroesophageal reflux disease   Mixed hyperlipidemia   Pancreatic carcinoma (HCC)   Acute metabolic encephalopathy  #1 biliary obstruction/history of pancreatic cancer -Patient admitted with confusion, jaundice, noted to have a elevated bilirubin level. 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 -It is noted.  She had an bilirubin went from 12-4.9 on admission with improvement with fevers. -CT scan done with portions of CBD stent filled with fluid on soft tissue density. -Leukocytosis trending down since admission. -Bilirubin levels trending down. -Blood cultures pending with no growth to date. -Continue empiric IV  Zosyn. -Patient seen by GI and patient for ERCP to clean out metal stent today. -Per GI. -Will need outpatient follow-up with primary oncologist postdischarge.  2.  Acute metabolic encephalopathy -Likely secondary to problem #1. -Blood cultures ordered and pending. -Urinalysis done nitrite negative, leukocytes negative. -Patient improving clinically. -Continue empiric IV Zosyn for now. -Supportive care.  3.  GERD -PPI.  4.  Hyperlipidemia -Not on a statin prior to admission. -Outpatient follow-up.  5.  Hypertension -Continue home regimen Norvasc, Bystolic.    DVT prophylaxis: Lovenox Code Status: Full Family Communication: Updated patient and daughter at bedside. Disposition: Likely home when clinically improved, cleared by GI.  Status is: Observation The patient remains OBS appropriate and will d/c before 2 midnights.   Consultants:  Gastroenterology: Dr. Carlean Purl 05/07/2022  Procedures:  CT abdomen and pelvis 05/06/2022 Chest x-ray 05/07/2022 Right upper quadrant ultrasound 05/06/2022  Antimicrobials:  IV Zosyn 05/07/2022>>>>>   Subjective: Patient sitting up in bed.  Daughter at bedside.  Denies any chest pain or shortness of breath.  Denies any significant abdominal pain.  Overall states she is feeling better than she did on admission.  Alert and oriented x 3.  Objective: Vitals:   05/07/22 2218 05/08/22 0213 05/08/22 0610 05/08/22 1044  BP: (!) 157/83 (!) 146/67 124/74 139/73  Pulse: 69 69 65 68  Resp: (!) '21 19 20 18  '$ Temp: 98.6 F (37 C) 98.4 F (36.9 C) 97.9 F (36.6 C) 98.2 F (36.8 C)  TempSrc: Oral  (P) Oral Oral  SpO2: 100% 99% 97% 100%  Weight:      Height:        Intake/Output Summary (Last 24 hours) at 05/08/2022 1301 Last data filed at 05/08/2022 0500 Gross  per 24 hour  Intake 157.25 ml  Output --  Net 157.25 ml   Filed Weights   05/07/22 2214  Weight: 52.3 kg    Examination:  General exam: Appears calm and comfortable.  Scleral  icterus. Respiratory system: Clear to auscultation.  No wheezes, no crackles, no rhonchi. Cardiovascular system: S1 & S2 heard, RRR. No JVD, murmurs, rubs, gallops or clicks. No pedal edema. Gastrointestinal system: Abdomen is nondistended, soft and nontender. No organomegaly or masses felt. Normal bowel sounds heard. Central nervous system: Alert and oriented. No focal neurological deficits. Extremities: Symmetric 5 x 5 power. Skin: No rashes, lesions or ulcers Psychiatry: Judgement and insight appear normal. Mood & affect appropriate.     Data Reviewed: I have personally reviewed following labs and imaging studies  CBC: Recent Labs  Lab 05/06/22 1642 05/07/22 0930 05/08/22 0526  WBC 11.2* 9.6 9.8  NEUTROABS 6.8 5.7  --   HGB 10.8* 9.5* 9.7*  HCT 33.6* 29.5* 30.1*  MCV 89.8 88.9 89.3  PLT 223 210 629    Basic Metabolic Panel: Recent Labs  Lab 05/06/22 1642 05/07/22 0930 05/08/22 0526  NA 134* 136 133*  K 3.6 3.8 3.6  CL 104 106 102  CO2 '23 23 25  '$ GLUCOSE 137* 92 93  BUN 25* 18 19  CREATININE 0.92 0.79 0.96  CALCIUM 8.9 9.3 8.7*    GFR: Estimated Creatinine Clearance: 35.4 mL/min (by C-G formula based on SCr of 0.96 mg/dL).  Liver Function Tests: Recent Labs  Lab 05/06/22 1642 05/07/22 0930 05/08/22 0526  AST 34 37 34  ALT 72* 59* 54*  ALKPHOS 258* 247* 216*  BILITOT 4.8* 4.9* 3.5*  PROT 6.2* 5.5* 5.1*  ALBUMIN 2.6* 2.3* 2.1*    CBG: No results for input(s): "GLUCAP" in the last 168 hours.   Recent Results (from the past 240 hour(s))  Culture, blood (Routine X 2) w Reflex to ID Panel     Status: None (Preliminary result)   Collection Time: 05/07/22  1:20 AM   Specimen: BLOOD  Result Value Ref Range Status   Specimen Description   Final    BLOOD BLOOD RIGHT FOREARM Performed at [facility] [address] [place], [place] [address]    Special Requests   Final    BOTTLES DRAWN AEROBIC AND ANAEROBIC Blood Culture results may  not be optimal due to an inadequate volume of blood received in culture bottles Performed at [facility] [address] [place], [place] [address]    Culture   Final    NO GROWTH 1 DAY Performed at [facility] [place] [address], [place], [place] [address]    Report Status PENDING  Incomplete  Culture, blood (Routine X 2) w Reflex to ID Panel     Status: None (Preliminary result)   Collection Time: 05/07/22  2:40 AM   Specimen: BLOOD  Result Value Ref Range Status   Specimen Description   Final    BLOOD BLOOD LEFT HAND Performed at [facility] [address] [place], [place] [address]    Special Requests   Final    BOTTLES DRAWN AEROBIC AND ANAEROBIC Blood Culture results may not be optimal due to an inadequate volume of blood received in culture bottles Performed at [facility] [address] [place],  [address]    Culture   Final    NO GROWTH 1 DAY Performed at [facility], [place]  [address] [place], [place] [address]    Report Status PENDING  Incomplete         Radiology Studies: DG Chest Port 1 View  Result Date: 05/07/2022 CLINICAL DATA:  Questionable sepsis - evaluate for abnormality. EXAM: PORTABLE CHEST 1 VIEW COMPARISON:  09/08/2021 FINDINGS: Right Port-A-Cath in place with the tip at the cavoatrial junction. Left pacer remains in place, unchanged. Heart and mediastinal contours are within normal limits. No focal opacities or effusions. No acute bony abnormality. Aortic atherosclerosis. IMPRESSION: No active cardiopulmonary disease. Electronically Signed   By: Rolm Baptise M.D.   On: 05/07/2022 02:06   CT ABDOMEN PELVIS WO CONTRAST  Result Date: 05/06/2022 CLINICAL DATA:  Pancreatic cancer with CBD stent. Abdominal pain and jaundice. * Tracking Code: BO * EXAM: CT ABDOMEN AND PELVIS WITHOUT CONTRAST TECHNIQUE: Multidetector CT imaging of the abdomen and pelvis was performed following the  standard protocol without IV contrast. RADIATION DOSE REDUCTION: This exam was performed according to the departmental dose-optimization program which includes automated exposure control, adjustment of the mA and/or kV according to patient size and/or use of iterative reconstruction technique. COMPARISON:  Abdominal ultrasound 05/06/2022; CT abdomen 05/08/2022 FINDINGS: Lower chest: Mild scarring in the right lower lobe and mild subsegmental atelectasis in the left lower lobe with trace bilateral pleural effusions. Pacer leads noted. Mild mitral valve calcification. Descending thoracic aortic atherosclerotic vascular calcification. Hepatobiliary: Pneumobilia noted favoring patency of the stent although components of the intrahepatic biliary tree are slightly more gas distended than they were on 01/16/2022. Portions of the stent or filled with intermediate density which is probably fluid but technically nonspecific. Portions the right hepatic lobe have mild intrahepatic biliary dilatation. Contracted gallbladder. Pancreas: The pancreatic head mass is poorly characterized on today's noncontrast examination. No substantial dorsal pancreatic duct dilatation. Spleen: Unremarkable Adrenals/Urinary Tract: Stable benign cyst exophytic from the right kidney lower pole. No further imaging workup of this lesion is indicated. Three punctate nonobstructive left renal calculi are present. No hydronephrosis or hydroureter. Urinary bladder unremarkable. Adrenal glands unremarkable. Stomach/Bowel: Unremarkable Vascular/Lymphatic: Atherosclerosis is present, including aortoiliac atherosclerotic disease. Reproductive: Unremarkable Other: Subtle edema in the perirenal spaces and mesentery as well as the subcutaneous tissues potentially from third spacing of fluid. Musculoskeletal: Lumbar spondylosis and degenerative disc disease causing mild multilevel impingement. IMPRESSION: 1. There is some very mild intrahepatic biliary dilatation  along with pneumobilia. In light of the increasing serum bilirubin levels, this could reflect very early or partial stent malfunction/occlusion. Portions of the expandable CBD stent are filled with fluid or soft tissue density. 2. The pancreatic tumor itself is poorly characterized on today's noncontrast CT exam. 3. Likely mild third say pacing of fluid with low-grade mesenteric, retroperitoneal, and subcutaneous edema along with trace bilateral pleural effusions. 4. Nonobstructive left nephrolithiasis. 5. Aortic Atherosclerosis (ICD10-I70.0). Mild mitral valve calcification. 6. Mild multilevel lumbar impingement. Electronically Signed   By: Van Clines M.D.   On: 05/06/2022 20:12   US Abdomen Limited RUQ (LIVER/GB)  Result Date: 05/06/2022 CLINICAL DATA:  Jaundice, history of common bile duct stent, pancreatic cancer EXAM: ULTRASOUND ABDOMEN LIMITED RIGHT UPPER QUADRANT COMPARISON:  01/16/2022 FINDINGS: Gallbladder: Gallbladder is moderately distended, with no evidence of cholelithiasis. Minimal echogenic sludge is identified. Borderline gallbladder wall thickening measuring 3 mm. Negative sonographic Murphy sign. No free fluid surrounding the gallbladder. Common bile duct: Diameter: Common bile duct stent is identified. The common bile duct measures up to 8 mm in diameter. Liver: Liver demonstrates normal echotexture. No focal lesion is identified.  There is mild intrahepatic biliary duct dilation, not appreciably changed from prior CT. Ring down artifact consistent with pneumobilia, consistent with at least partial patency of the common bile duct stent. Portal vein is patent on color Doppler imaging with normal direction of blood flow towards the liver. Other: Stable exophytic simple 6 cm right renal cyst does not require follow-up. IMPRESSION: 1. Common bile duct stent as above. There is mild persistent intrahepatic biliary duct dilation, with evidence of pneumobilia suggesting at least partial patency  of the biliary stent. If further evaluation of the stent is desired, CT could be performed. 2. Gallbladder sludge, with no evidence of cholelithiasis or acute cholecystitis. Electronically Signed   By: Randa Ngo M.D.   On: 05/06/2022 19:27        Scheduled Meds:  amLODipine  2.5 mg Oral QHS   aspirin EC  81 mg Oral Daily   cholecalciferol  2,000 Units Oral QPC supper   diclofenac  100 mg Rectal Once   enoxaparin (LOVENOX) injection  40 mg Subcutaneous Q24H   famotidine  40 mg Oral QPM   gabapentin  100 mg Oral BID   multivitamin  1 tablet Oral QHS   multivitamin with minerals  1 tablet Oral Q breakfast   nebivolol  2.5 mg Oral Daily   pantoprazole  80 mg Oral QAC breakfast   Continuous Infusions:  lactated ringers 50 mL/hr at 05/07/22 2251   piperacillin-tazobactam (ZOSYN)  IV 3.375 g (05/08/22 0944)     LOS: 0 days    Time spent: 35 minutes    Irine Seal, MD [facility]   To contact the attending provider between 7A-7P or the covering provider during after hours 7P-7A, please log into the web site [email or web address] and access using universal [place] password for that web site. If you do not have the password, please call the hospital operator.  05/08/2022, 1:01 PM

## 2022-05-08 NOTE — Care Management Obs Status (Signed)
[facility] NOTIFICATION   Patient Details  Name: Meagan [name] MRN: [id number] Date of Birth: Oct 04, 1934   Medicare Observation Status Notification Given:  Yes    MahabirJuliann Pulse, RN 05/08/2022, 11:46 AM

## 2022-05-08 NOTE — Op Note (Signed)
[facility] Patient Name: Meagan [name] Procedure Date: 05/08/2022 MRN: [id number] Attending MD: Gatha Mayer , MD, [id number] Date of Birth: 1934-06-09 CSN: [id number] Age: 87 Admit Type: Inpatient Procedure:                ERCP Indications:              Malignant stricture of the common bile duct,                            Jaundice, Malignant tumor of the head of pancreas Providers:                Gatha Mayer, MD, [name], Technician Referring MD:              Medicines:                General Anesthesia, on Zosyn q 8 hrs Complications:            No immediate complications. Estimated Blood Loss:     Estimated blood loss: none. Procedure:                Pre-Anesthesia Assessment:                           - Prior to the procedure, a History and Physical                            was performed, and patient medications and                            allergies were reviewed. The patient's tolerance of                            previous anesthesia was also reviewed. The risks                            and benefits of the procedure and the sedation                            options and risks were discussed with the patient.                            All questions were answered, and informed consent                            was obtained. Prior Anticoagulants: The patient                            last took Lovenox (enoxaparin) 1 day prior to the                            procedure. ASA Grade Assessment: III - A patient                            with severe systemic disease. After reviewing the  risks and benefits, the patient was deemed in                            satisfactory condition to undergo the procedure.                           After obtaining informed consent, the scope was                            passed under direct vision. Throughout the                            procedure, the patient's blood  pressure, pulse, and                            oxygen saturations were monitored continuously. The                            [employer] single use                            duodenoscope was introduced through the mouth, and                            used to inject contrast into and used to inject                            contrast into the bile duct. The ERCP was                            accomplished without difficulty. The patient                            tolerated the procedure well. Scope In: Scope Out: Findings:      Esophagus not seen well. Stomach grossly normal. Duodenum devoid of bile       and metal stent seen at major papilla. cannulated stent with wire and       sphincterotome. Balloon catheter then placed and contrast injection with       occlusion technique showed moderate intrahepatic dilayion w.       underfilling left intrahepatics. balloon swept through existing 10x60       SEMS and had to reduce from 12 to 9 mm to pull through. occlusion       cholangiogram showed in stent stenosis in mid-distal region of sternt       and CBD. 10 x 60 uncovered SEMS placed with good results and flow of       bile again. 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 in stent stenosis - treated with second 10x60  mm uncovered SEMS with good result Moderate Sedation:      Not Applicable - Patient had care per Anesthesia. Recommendation:           - return to floor                           advance diet as tolerated starting w/ clears                           labs in AM and home tomorrow if ok - would like to                            see decline in bili - we will round                           Abx - can chage to Augmentin 875 mg bid for 7 days                            total                           Can get repeat labs w/ Dr. Delton Coombes - hem-onc                             appointment 05/18/22 does not need to see GI (prn) Procedure Code(s):        --- Professional ---                           [phone], Endoscopic retrograde                            cholangiopancreatography (ERCP); with placement of                            endoscopic stent into biliary or pancreatic duct,                            including pre- and post-dilation and guide wire                            passage, when performed, including sphincterotomy,                            when performed, each stent Diagnosis Code(s):        --- Professional ---                           K83.1, Obstruction of bile duct                           R17, Unspecified jaundice                           C25.0, Malignant neoplasm of head of pancreas CPT copyright 2022 [employer]. All rights reserved. The codes  documented in this report are preliminary and upon coder review may  be revised to meet current compliance requirements. Gatha Mayer, MD 05/08/2022 4:31:00 PM This report has been signed electronically. Number of Addenda: 0

## 2022-05-09 ENCOUNTER — Encounter: Payer: Self-pay | Admitting: Hematology

## 2022-05-09 ENCOUNTER — Other Ambulatory Visit (HOSPITAL_COMMUNITY): Payer: Self-pay

## 2022-05-09 DIAGNOSIS — K219 Gastro-esophageal reflux disease without esophagitis: Secondary | ICD-10-CM | POA: Diagnosis present

## 2022-05-09 DIAGNOSIS — E782 Mixed hyperlipidemia: Secondary | ICD-10-CM | POA: Diagnosis present

## 2022-05-09 DIAGNOSIS — Z888 Allergy status to other drugs, medicaments and biological substances status: Secondary | ICD-10-CM | POA: Diagnosis not present

## 2022-05-09 DIAGNOSIS — K831 Obstruction of bile duct: Secondary | ICD-10-CM | POA: Diagnosis present

## 2022-05-09 DIAGNOSIS — I10 Essential (primary) hypertension: Secondary | ICD-10-CM | POA: Diagnosis present

## 2022-05-09 DIAGNOSIS — C250 Malignant neoplasm of head of pancreas: Secondary | ICD-10-CM | POA: Diagnosis present

## 2022-05-09 DIAGNOSIS — Y831 Surgical operation with implant of artificial internal device as the cause of abnormal reaction of the patient, or of later complication, without mention of misadventure at the time of the procedure: Secondary | ICD-10-CM | POA: Diagnosis present

## 2022-05-09 DIAGNOSIS — Z7982 Long term (current) use of aspirin: Secondary | ICD-10-CM | POA: Diagnosis not present

## 2022-05-09 DIAGNOSIS — R17 Unspecified jaundice: Secondary | ICD-10-CM | POA: Diagnosis present

## 2022-05-09 DIAGNOSIS — T85858A Stenosis due to other internal prosthetic devices, implants and grafts, initial encounter: Secondary | ICD-10-CM | POA: Diagnosis present

## 2022-05-09 DIAGNOSIS — C259 Malignant neoplasm of pancreas, unspecified: Secondary | ICD-10-CM | POA: Diagnosis not present

## 2022-05-09 DIAGNOSIS — Z8616 Personal history of COVID-19: Secondary | ICD-10-CM | POA: Diagnosis not present

## 2022-05-09 DIAGNOSIS — G9341 Metabolic encephalopathy: Secondary | ICD-10-CM | POA: Diagnosis present

## 2022-05-09 DIAGNOSIS — Z79899 Other long term (current) drug therapy: Secondary | ICD-10-CM | POA: Diagnosis not present

## 2022-05-09 DIAGNOSIS — Z91041 Radiographic dye allergy status: Secondary | ICD-10-CM | POA: Diagnosis not present

## 2022-05-09 DIAGNOSIS — G250 Essential tremor: Secondary | ICD-10-CM | POA: Diagnosis present

## 2022-05-09 LAB — CBC WITH DIFFERENTIAL/PLATELET
Abs Immature Granulocytes: 0.47 10*3/uL — ABNORMAL HIGH (ref 0.00–0.07)
Basophils Absolute: 0 10*3/uL (ref 0.0–0.1)
Basophils Relative: 0 %
Eosinophils Absolute: 0 10*3/uL (ref 0.0–0.5)
Eosinophils Relative: 0 %
HCT: 30.9 % — ABNORMAL LOW (ref 36.0–46.0)
Hemoglobin: 9.9 g/dL — ABNORMAL LOW (ref 12.0–15.0)
Immature Granulocytes: 4 %
Lymphocytes Relative: 11 %
Lymphs Abs: 1.3 10*3/uL (ref 0.7–4.0)
MCH: 29 pg (ref 26.0–34.0)
MCHC: 32 g/dL (ref 30.0–36.0)
MCV: 90.6 fL (ref 80.0–100.0)
Monocytes Absolute: 1 10*3/uL (ref 0.1–1.0)
Monocytes Relative: 8 %
Neutro Abs: 9 10*3/uL — ABNORMAL HIGH (ref 1.7–7.7)
Neutrophils Relative %: 77 %
Platelets: 310 10*3/uL (ref 150–400)
RBC: 3.41 MIL/uL — ABNORMAL LOW (ref 3.87–5.11)
RDW: 17.6 % — ABNORMAL HIGH (ref 11.5–15.5)
WBC: 11.7 10*3/uL — ABNORMAL HIGH (ref 4.0–10.5)
nRBC: 0 % (ref 0.0–0.2)

## 2022-05-09 LAB — COMPREHENSIVE METABOLIC PANEL
ALT: 50 U/L — ABNORMAL HIGH (ref 0–44)
AST: 31 U/L (ref 15–41)
Albumin: 2.2 g/dL — ABNORMAL LOW (ref 3.5–5.0)
Alkaline Phosphatase: 213 U/L — ABNORMAL HIGH (ref 38–126)
Anion gap: 5 (ref 5–15)
BUN: 19 mg/dL (ref 8–23)
CO2: 28 mmol/L (ref 22–32)
Calcium: 9.2 mg/dL (ref 8.9–10.3)
Chloride: 103 mmol/L (ref 98–111)
Creatinine, Ser: 1.09 mg/dL — ABNORMAL HIGH (ref 0.44–1.00)
GFR, Estimated: 50 mL/min — ABNORMAL LOW (ref >60)
Glucose, Bld: 152 mg/dL — ABNORMAL HIGH (ref 70–99)
Potassium: 4.3 mmol/L (ref 3.5–5.1)
Sodium: 136 mmol/L (ref 135–145)
Total Bilirubin: 3 mg/dL — ABNORMAL HIGH (ref 0.3–1.2)
Total Protein: 5.9 g/dL — ABNORMAL LOW (ref 6.5–8.1)

## 2022-05-09 LAB — MAGNESIUM: Magnesium: 2 mg/dL (ref 1.7–2.4)

## 2022-05-09 MED ORDER — AMOXICILLIN-POT CLAVULANATE 875-125 MG PO TABS
1.0000 | ORAL_TABLET | Freq: Two times a day (BID) | ORAL | 0 refills | Status: AC
  Filled 2022-05-09: qty 14, 7d supply, fill #0

## 2022-05-09 MED ORDER — AMOXICILLIN-POT CLAVULANATE 875-125 MG PO TABS
1.0000 | ORAL_TABLET | Freq: Two times a day (BID) | ORAL | Status: DC
  Administered 2022-05-09: 1 via ORAL
  Filled 2022-05-09: qty 1

## 2022-05-09 NOTE — Discharge Summary (Signed)
Physician Discharge Summary  Azadeh Hyder XHB:[id number] DOB: January 03, 1935 DOA: 05/06/2022  PCP: Abigail Miyamoto, [name]  Admit date: 05/06/2022 Discharge date: 05/09/2022  Time spent: 55 minutes  Recommendations for Outpatient Follow-up:  Follow-up with Dr. Delton Coombes as scheduled.  On follow-up patient needed comprehensive metabolic profile done to follow-up on electrolytes, renal function, LFTs. Follow-up with Abigail Miyamoto, [name] in 2 weeks.   Discharge Diagnoses:  Principal Problem:   Bile duct obstruction Active Problems:   Common bile duct stricture   Essential hypertension   Gastroesophageal reflux disease   Mixed hyperlipidemia   Pancreatic carcinoma (HCC)   Acute metabolic encephalopathy   Discharge Condition: Stable and improved.  Diet recommendation: Regular  Filed Weights   05/07/22 2214  Weight: 52.3 kg    History of present illness:  HPI per Dr. Adron Bene is a 87 y.o. female with medical history significant of pancreatic carcinoma, HTN, GERD, HLD, vertigo. Presenting with jaundice and confusion for about a week. History is from niece by phone. Her family became concerned and took her to [facility] ED. At [facility] ED she had imaging that was concerning for possible blockage of her biliary stent. They called for transfer to [facility], but [facility] initially refused as they stated that there was no one to perform ERCPs this week. Per the niece, oncology reached out to [facility] (Dr. Louanna Raw) and apparently he would be available for ERCP if necessary. Thus, the patient came to [facility] for evaluation.     Hospital Course:  #1 biliary obstruction/history of pancreatic cancer status post ERCP with second uncovered SEMS stent placement. -Patient admitted with confusion, jaundice, noted to have a elevated bilirubin level. -Patient noted to have had biliary stricture in the past treated with uncovered metal stent 09/2021 presenting with fevers, confusion, elevated bilirubin level  concerning for stent occlusion. -It is noted.  She had an bilirubin went from 12 to 4.9 on admission with improvement with fevers. -CT scan done with portions of CBD stent filled with fluid on soft tissue density. -Patient was placed empirically on IV Zosyn. -Leukocytosis trended down since admission. -Bilirubin levels trended down to 3.0 by day of discharge. -Blood cultures pending with no growth to date. -Patient seen by GI and patient underwent ERCP status post 10 x 60 mm uncovered SEMS placement.  ERCP noted biliary stricture in the common bile duct with in-stent stenosis requiring second stent placement.   -Patient improved clinically, started on clear liquids post procedure and diet advanced to a soft diet which patient tolerated.   -Bilirubin trended down.   -Patient subsequently transitioned from Zosyn to Augmentin and will be discharged on a 7-day course of Augmentin to complete a course of antibiotic treatment.   -Outpatient follow-up with primary hematologist/oncologist Dr. Delton Coombes as scheduled.  Comprehensive metabolic profile need to be obtained on follow-up.   -Patient will be discharged in stable and improved condition.   2.  Acute metabolic encephalopathy -Likely secondary to problem #1. -Blood cultures ordered and pending with no growth to date.. -Urinalysis done nitrite negative, leukocytes negative. -Patient placed empirically on IV Zosyn on admission secondary to problem #1. -Patient improved clinically during the hospitalization and was back to baseline by day of discharge. -Outpatient follow-up.   3.  GERD -Patient maintained on PPI.   4.  Hyperlipidemia -Not on a statin prior to admission. -Outpatient follow-up.   5.  Hypertension -Blood pressure controlled on home regimen Norvasc, Bystolic.    Procedures: CT abdomen and pelvis 05/06/2022 Chest x-ray 05/07/2022  Right upper quadrant ultrasound 05/06/2022 ERCP status post 10 x 16 mm with uncovered SEMS  placement 05/08/2022.  Dr. Christophe Louis  Consultations: Gastroenterology: Dr. Carlean Purl 05/07/2022    Discharge Exam: Vitals:   05/09/22 0530 05/09/22 1149  BP: 132/78 (!) 145/72  Pulse: 73 73  Resp: 17 17  Temp: 97.8 F (36.6 C) (!) 97.4 F (36.3 C)  SpO2: 100% 100%    General: NAD Cardiovascular: RRR no murmurs rubs or gallops.  No JVD.  No lower extremity edema. Respiratory: Clear to auscultation bilaterally.  No wheezes, no crackles, no rhonchi.  Fair air movement.  Speaking in full sentences.  Discharge Instructions   Discharge Instructions     Diet - low sodium heart healthy   Complete by: As directed    Increase activity slowly   Complete by: As directed       Allergies as of 05/09/2022       Reactions   Ivp Dye [iodinated Contrast Media] Hives   Lisinopril Cough        Medication List     TAKE these medications    amLODipine 2.5 MG tablet Commonly known as: NORVASC Take 2.5 mg by mouth at bedtime.   amoxicillin-clavulanate 875-125 MG tablet Commonly known as: AUGMENTIN Take 1 tablet by mouth every 12 (twelve) hours for 7 days.   aspirin EC 81 MG tablet Take 81 mg by mouth daily. Swallow whole.   Co Q-10 100 MG Caps Take 100 mg by mouth every evening.   famotidine 40 MG tablet Commonly known as: PEPCID Take 40 mg by mouth every evening.   gabapentin 100 MG capsule Commonly known as: NEURONTIN Take 100 mg by mouth 2 (two) times daily.   Joint Support Caps Take 1 capsule by mouth daily with breakfast.   lidocaine-prilocaine cream Commonly known as: EMLA Apply a small amount to port a cath site (do not rub in) and cover with plastic wrap 1 hour prior to infusion appointments   meclizine 25 MG tablet Commonly known as: ANTIVERT Take 25 mg by mouth daily as needed for dizziness.   multivitamin tablet Take 1 tablet by mouth daily with breakfast.   nebivolol 5 MG tablet Commonly known as: BYSTOLIC Take 2.5 mg by mouth in the morning.    Ocuvite Eye Health Formula Caps Take 1 capsule by mouth daily.   omeprazole 40 MG capsule Commonly known as: PRILOSEC Take 40 mg by mouth daily before breakfast.   polyethylene glycol 17 g packet Commonly known as: MiraLax Take 17 g by mouth daily as needed. What changed: reasons to take this   primidone 50 MG tablet Commonly known as: MYSOLINE Take 25 mg by mouth daily as needed (for tremors).   prochlorperazine 10 MG tablet Commonly known as: COMPAZINE Take 1 tablet (10 mg total) by mouth every 6 (six) hours as needed for nausea or vomiting.   pyridOXINE 100 MG tablet Commonly known as: VITAMIN B6 Take 50 mg by mouth in the morning.   REFRESH OP Place 1 drop into both eyes daily as needed (tired eyes).   tiZANidine 2 MG tablet Commonly known as: ZANAFLEX Take 1 mg by mouth daily as needed for muscle spasms.   Vitamin D3 50 MCG (2000 UT) Tabs Take 2,000 Units by mouth daily after supper.       Allergies  Allergen Reactions   Ivp Dye [Iodinated Contrast Media] Hives   Lisinopril Cough    Follow-up Information     Derek Jack, MD. Schedule  an appointment as soon as possible for a visit.   Specialty: Hematology Why: f/u as scheduled Contact information: [place] [address] [phone]         Abigail Miyamoto, [place]. Schedule an appointment as soon as possible for a visit in 2 week(s).   Specialty: Family Medicine Contact information: [address] [place] [address] [phone]                  The results of significant diagnostics from this hospitalization (including imaging, microbiology, ancillary and laboratory) are listed below for reference.    Significant Diagnostic Studies: DG ERCP  Result Date: 05/09/2022 CLINICAL DATA:  Pancreatic cancer with common bile duct stent in place EXAM: ERCP TECHNIQUE: Multiple spot images obtained with the fluoroscopic device and submitted for interpretation  post-procedure. FLUOROSCOPY: Radiation Exposure Index (as provided by the fluoroscopic device): 40.53 mGy Kerma COMPARISON:  CT scan of the abdomen and pelvis 05/06/2022 FINDINGS: Eight intraoperative saved images are submitted for review. The images demonstrate a flexible duodenal scope in the descending duodenum followed by successful cannulation of the existing biliary stent. Subsequent images demonstrate cholangiography showing occlusion of the biliary stent. Balloon sweeping is then performed followed by second stent placement within the existing stent. IMPRESSION: 1. The occlusion of the existing biliary stent by what appears to be tumoral in growth. 2. Successful placement of a second stent coaxially within the first stent. These images were submitted for radiologic interpretation only. Please see the procedural report for the amount of contrast and the fluoroscopy time utilized. Electronically Signed   By: Jacqulynn Cadet M.D.   On: 05/09/2022 06:51   DG Chest Port 1 View  Result Date: 05/07/2022 CLINICAL DATA:  Questionable sepsis - evaluate for abnormality. EXAM: PORTABLE CHEST 1 VIEW COMPARISON:  09/08/2021 FINDINGS: Right Port-A-Cath in place with the tip at the cavoatrial junction. Left pacer remains in place, unchanged. Heart and mediastinal contours are within normal limits. No focal opacities or effusions. No acute bony abnormality. Aortic atherosclerosis. IMPRESSION: No active cardiopulmonary disease. Electronically Signed   By: Rolm Baptise M.D.   On: 05/07/2022 02:06   CT ABDOMEN PELVIS WO CONTRAST  Result Date: 05/06/2022 CLINICAL DATA:  Pancreatic cancer with CBD stent. Abdominal pain and jaundice. * Tracking Code: BO * EXAM: CT ABDOMEN AND PELVIS WITHOUT CONTRAST TECHNIQUE: Multidetector CT imaging of the abdomen and pelvis was performed following the standard protocol without IV contrast. RADIATION DOSE REDUCTION: This exam was performed according to the departmental  dose-optimization program which includes automated exposure control, adjustment of the mA and/or kV according to patient size and/or use of iterative reconstruction technique. COMPARISON:  Abdominal ultrasound 05/06/2022; CT abdomen 05/08/2022 FINDINGS: Lower chest: Mild scarring in the right lower lobe and mild subsegmental atelectasis in the left lower lobe with trace bilateral pleural effusions. Pacer leads noted. Mild mitral valve calcification. Descending thoracic aortic atherosclerotic vascular calcification. Hepatobiliary: Pneumobilia noted favoring patency of the stent although components of the intrahepatic biliary tree are slightly more gas distended than they were on 01/16/2022. Portions of the stent or filled with intermediate density which is probably fluid but technically nonspecific. Portions the right hepatic lobe have mild intrahepatic biliary dilatation. Contracted gallbladder. Pancreas: The pancreatic head mass is poorly characterized on today's noncontrast examination. No substantial dorsal pancreatic duct dilatation. Spleen: Unremarkable Adrenals/Urinary Tract: Stable benign cyst exophytic from the right kidney lower pole. No further imaging workup of this lesion is indicated. Three punctate nonobstructive left renal  calculi are present. No hydronephrosis or hydroureter. Urinary bladder unremarkable. Adrenal glands unremarkable. Stomach/Bowel: Unremarkable Vascular/Lymphatic: Atherosclerosis is present, including aortoiliac atherosclerotic disease. Reproductive: Unremarkable Other: Subtle edema in the perirenal spaces and mesentery as well as the subcutaneous tissues potentially from third spacing of fluid. Musculoskeletal: Lumbar spondylosis and degenerative disc disease causing mild multilevel impingement. IMPRESSION: 1. There is some very mild intrahepatic biliary dilatation along with pneumobilia. In light of the increasing serum bilirubin levels, this could reflect very early or partial  stent malfunction/occlusion. Portions of the expandable CBD stent are filled with fluid or soft tissue density. 2. The pancreatic tumor itself is poorly characterized on today's noncontrast CT exam. 3. Likely mild third say pacing of fluid with low-grade mesenteric, retroperitoneal, and subcutaneous edema along with trace bilateral pleural effusions. 4. Nonobstructive left nephrolithiasis. 5. Aortic Atherosclerosis (ICD10-I70.0). Mild mitral valve calcification. 6. Mild multilevel lumbar impingement. Electronically Signed   By: Van Clines M.D.   On: 05/06/2022 20:12   US Abdomen Limited RUQ (LIVER/GB)  Result Date: 05/06/2022 CLINICAL DATA:  Jaundice, history of common bile duct stent, pancreatic cancer EXAM: ULTRASOUND ABDOMEN LIMITED RIGHT UPPER QUADRANT COMPARISON:  01/16/2022 FINDINGS: Gallbladder: Gallbladder is moderately distended, with no evidence of cholelithiasis. Minimal echogenic sludge is identified. Borderline gallbladder wall thickening measuring 3 mm. Negative sonographic Murphy sign. No free fluid surrounding the gallbladder. Common bile duct: Diameter: Common bile duct stent is identified. The common bile duct measures up to 8 mm in diameter. Liver: Liver demonstrates normal echotexture. No focal lesion is identified. There is mild intrahepatic biliary duct dilation, not appreciably changed from prior CT. Ring down artifact consistent with pneumobilia, consistent with at least partial patency of the common bile duct stent. Portal vein is patent on color Doppler imaging with normal direction of blood flow towards the liver. Other: Stable exophytic simple 6 cm right renal cyst does not require follow-up. IMPRESSION: 1. Common bile duct stent as above. There is mild persistent intrahepatic biliary duct dilation, with evidence of pneumobilia suggesting at least partial patency of the biliary stent. If further evaluation of the stent is desired, CT could be performed. 2. Gallbladder sludge,  with no evidence of cholelithiasis or acute cholecystitis. Electronically Signed   By: Randa Ngo M.D.   On: 05/06/2022 19:27    Microbiology: Recent Results (from the past 240 hour(s))  Culture, blood (Routine X 2) w Reflex to ID Panel     Status: None (Preliminary result)   Collection Time: 05/07/22  1:20 AM   Specimen: BLOOD  Result Value Ref Range Status   Specimen Description   Final    BLOOD BLOOD RIGHT FOREARM Performed at [facility] [address] [place], [place] [address]    Special Requests   Final    BOTTLES DRAWN AEROBIC AND ANAEROBIC Blood Culture results may not be optimal due to an inadequate volume of blood received in culture bottles Performed at [facility] [address], [place], [place] [address]    Culture   Final    NO GROWTH 2 DAYS Performed at [facility] [address], [place],  [address]    Report Status PENDING  Incomplete  Culture, blood (Routine X 2) w Reflex to ID Panel     Status: None (Preliminary result)   Collection Time: 05/07/22  2:40 AM   Specimen: BLOOD  Result Value Ref Range Status   Specimen Description   Final    BLOOD BLOOD LEFT HAND  Performed at [facility], [place] [address], [place], [place] [address]    Special Requests   Final    BOTTLES DRAWN AEROBIC AND ANAEROBIC Blood Culture results may not be optimal due to an inadequate volume of blood received in culture bottles Performed at [facility] [address] [place], [place] [address]    Culture   Final    NO GROWTH 2 DAYS Performed at [facility] [address] [place], [place] [address]    Report Status PENDING  Incomplete     Labs: Basic Metabolic Panel: Recent Labs  Lab 05/06/22 1642 05/07/22 0930 05/08/22 0526 05/09/22 0531  NA 134* 136 133* 136  K 3.6 3.8 3.6 4.3  CL 104 106 102 103  CO2 '23 23 25 28  '$ GLUCOSE 137* 92 93 152*  BUN 25* '18 19 19   '$ CREATININE 0.92 0.79 0.96 1.09*  CALCIUM 8.9 9.3 8.7* 9.2  MG  --   --   --  2.0   Liver Function Tests: Recent Labs  Lab 05/06/22 1642 05/07/22 0930 05/08/22 0526 05/09/22 0531  AST 34 37 34 31  ALT 72* 59* 54* 50*  ALKPHOS 258* 247* 216* 213*  BILITOT 4.8* 4.9* 3.5* 3.0*  PROT 6.2* 5.5* 5.1* 5.9*  ALBUMIN 2.6* 2.3* 2.1* 2.2*   Recent Labs  Lab 05/06/22 1642  LIPASE 63*   Recent Labs  Lab 05/07/22 0930  AMMONIA 39*   CBC: Recent Labs  Lab 05/06/22 1642 05/07/22 0930 05/08/22 0526 05/09/22 0531  WBC 11.2* 9.6 9.8 11.7*  NEUTROABS 6.8 5.7  --  9.0*  HGB 10.8* 9.5* 9.7* 9.9*  HCT 33.6* 29.5* 30.1* 30.9*  MCV 89.8 88.9 89.3 90.6  PLT 223 210 254 310   Cardiac Enzymes: No results for input(s): "CKTOTAL", "CKMB", "CKMBINDEX", "TROPONINI" in the last 168 hours. BNP: BNP (last 3 results) No results for input(s): "BNP" in the last 8760 hours.  ProBNP (last 3 results) No results for input(s): "PROBNP" in the last 8760 hours.  CBG: No results for input(s): "GLUCAP" in the last 168 hours.     Signed:  Irine Seal MD.  [facility] 05/09/2022, 12:27 PM

## 2022-05-09 NOTE — Progress Notes (Signed)
Mobility Specialist - Progress Note   05/09/22 0958  Mobility  Activity Ambulated with assistance in hallway  Level of Assistance Modified independent, requires aide device or extra time  Assistive Device Front wheel walker  Distance Ambulated (ft) 500 ft  Range of Motion/Exercises Active  Activity Response Tolerated well  Mobility Referral Yes  $Mobility charge 1 Mobility   Pt was found in bed and agreeable to ambulate. Had no complaints during session and at EOS returned to bed with necessities in reach and daughter in room.  Ferd Hibbs Mobility Specialist

## 2022-05-09 NOTE — Progress Notes (Signed)
[facility] ROUNDING NOTE   Subjective: Patient feeling well this [name], [place] in the hallway with PT.  She denies any abdominal [name], [place] or vomiting.  She is looking forward to her breakfast.   Objective: Vital signs in last 24 hours: Temp:  [97.6 F (36.4 C)-98.5 F (36.9 C)] 97.8 F (36.6 C) (12/09 [name]) Pulse Rate:  [60-79] 73 (12/09 [name]) Resp:  [13-19] 17 (12/09 [name]) BP: (132-184)/(60-84) 132/78 (12/09 [name]) SpO2:  [99 %-100 %] 100 % (12/09 [name]) Last BM Date : 05/07/22 General: NAD, pleasant elderly Caucasian female [place] in hallway with walker   Intake/Output from previous day: 12/08 0701 - 12/09 0700 In: 487.3 [P.O.:60; I.V.:300; IV Piggyback:127.3] Out: 2 [Blood:2] Intake/Output this shift: No intake/output data recorded.   Lab Results: Recent Labs    05/07/22 0930 05/08/22 0526 05/09/22 0531  WBC 9.6 9.8 11.7*  HGB 9.5* 9.7* 9.9*  PLT 210 254 310  MCV 88.9 89.3 90.6   BMET Recent Labs    05/07/22 0930 05/08/22 0526 05/09/22 0531  NA 136 133* 136  K 3.8 3.6 4.3  CL 106 102 103  CO2 '23 25 28  '$ GLUCOSE 92 93 152*  BUN '18 19 19  '$ CREATININE 6.96 0.96 1.09*  CALCIUM 9.3 8.7* 9.2   LFT Recent Labs    05/07/22 0930 05/08/22 0526 05/09/22 0531  PROT 5.5* 5.1* 5.9*  ALBUMIN 2.3* 2.1* 2.2*  AST 37 34 31  ALT 59* 54* 50*  ALKPHOS 247* 216* 213*  BILITOT 4.9* 3.5* 3.0*   PT/INR No results for input(s): "INR" in the last 72 hours.    Imaging/Other results: DG ERCP  Result Date: 05/09/2022 CLINICAL DATA:  Pancreatic cancer with common bile duct stent in place EXAM: ERCP TECHNIQUE: Multiple spot images obtained with the fluoroscopic device and submitted for interpretation post-procedure. FLUOROSCOPY: Radiation Exposure Index (as provided by the fluoroscopic device): 40.53 mGy Kerma COMPARISON:  CT scan of the abdomen and pelvis 05/06/2022 FINDINGS: Eight intraoperative saved images are submitted for review. The images  demonstrate a flexible duodenal scope in the descending duodenum followed by successful cannulation of the existing biliary stent. Subsequent images demonstrate cholangiography showing occlusion of the biliary stent. Balloon sweeping is then performed followed by second stent placement within the existing stent. IMPRESSION: 1. The occlusion of the existing biliary stent by what appears to be tumoral in growth. 2. Successful placement of a second stent coaxially within the first stent. These images were submitted for radiologic interpretation only. Please see the procedural report for the amount of contrast and the fluoroscopy time utilized. Electronically Signed   By: Jacqulynn Cadet M.D.   On: 05/09/2022 06:51      Assessment and Plan:  87 yo female with pancreatic cancer s/p placement of uncovered metal biliary stent 10/29/21. Admitted with biliary obstruction / fever at home. CT scan showing that portions of the CBD stent are filled with fluid or soft tissue density . On antibiotics and leukocytosis has resolved. Bilirubin down from 12 to 3 today following ERCP yesterday in which a second metal stent was placed within the obstructed metal stent. Liver enzymes continue to downtrend slowly.  White blood cell count slightly up today from yesterday, but no other indications of infection. Overall she appears to be doing very well.  Likely okay for discharge, although 1 more day of observation would also not be unreasonable given her frail status.  Pancreatic cancer with obstructed biliary stent, status post stent and stent placement - Okay  to DC home from GI standpoint if hospitalist agrees - Continue Augmentin 875 mg twice daily for 7 days total - Repeat labs with her oncologist at her next appointment (December 18) - No GI follow-up necessary unless evidence of recurrent occlusion   Daryel November, MD  05/09/2022, 10:21 AM [facility]

## 2022-05-09 NOTE — TOC Transition Note (Signed)
Transition of Care [facility]) - CM/SW Discharge Note   Patient Details  Name: Nandika Stetzer MRN: [id number] Date of Birth: 1934/12/21  [facility] CM/SW Contact:  Leeroy Cha, RN Phone Number: 05/09/2022, 12:44 PM   Clinical Narrative:     Patient dcd to home with self care.  No toc needs  Final next level of care: Home/Self Care Barriers to Discharge: Barriers Resolved   Patient Goals and CMS Choice Patient states their goals for this hospitalization and ongoing recovery are:: to go home CMS Medicare.gov Compare Post Acute Care list provided to:: Patient Choice offered to / list presented to : Patient  Discharge Placement                       Discharge Plan and Services   Discharge Planning Services: CM Consult                                 Social Determinants of Health (SDOH) Interventions     Readmission Risk Interventions   No data to display

## 2022-05-09 NOTE — TOC Initial Note (Signed)
Transition of Care [facility]) - Initial/Assessment Note    Patient Details  Name: Meagan [name] MRN: [id number] Date of Birth: 12/31/1934  Transition of Care [facility]) CM/SW Contact:    Leeroy Cha, RN Phone Number: 05/09/2022, 12:44 PM  Clinical Narrative:                  Transition of Care [facility]) Screening Note   Patient Details  Name: Meagan [name] Date of Birth: Nov 09, 1934   Transition of Care [facility]) CM/SW Contact:    Leeroy Cha, RN Phone Number: 05/09/2022, 12:44 PM    Transition of Care Department [facility]) has reviewed patient and no TOC needs have been identified at this time. We will continue to monitor patient advancement through interdisciplinary progression rounds. If new patient transition needs arise, please place a TOC consult.    Expected Discharge Plan: Home/Self Care Barriers to Discharge: Barriers Resolved   Patient Goals and CMS Choice Patient states their goals for this hospitalization and ongoing recovery are:: to go home CMS Medicare.gov Compare Post Acute Care list provided to:: Patient Choice offered to / list presented to : Patient  Expected Discharge Plan and Services Expected Discharge Plan: Home/Self Care   Discharge Planning Services: CM Consult   Living arrangements for the past 2 months: Single Family Home Expected Discharge Date: 05/09/22                                    Prior Living Arrangements/Services Living arrangements for the past 2 months: Single Family Home Lives with:: Spouse Patient language and need for interpreter reviewed:: Yes Do you feel safe going back to the place where you live?: Yes            Criminal Activity/Legal Involvement Pertinent to Current Situation/Hospitalization: No - Comment as needed  Activities of Daily Living Home Assistive Devices/Equipment: None ADL Screening (condition at time of admission) Patient's cognitive ability adequate to safely complete daily activities?:  Yes Is the patient deaf or have difficulty hearing?: No Does the patient have difficulty seeing, even when wearing glasses/contacts?: No Does the patient have difficulty concentrating, remembering, or making decisions?: No Patient able to express need for assistance with ADLs?: Yes Does the patient have difficulty dressing or bathing?: No Independently performs ADLs?: Yes (appropriate for developmental age) Does the patient have difficulty walking or climbing stairs?: Yes Weakness of Legs: Both Weakness of Arms/Hands: None  Permission Sought/Granted                  Emotional Assessment Appearance:: Appears stated age Attitude/Demeanor/Rapport: Engaged Affect (typically observed): Calm Orientation: : Oriented to Self, Oriented to Place, Oriented to  Time, Oriented to Situation Alcohol / Substance Use: Not Applicable Psych Involvement: No (comment)  Admission diagnosis:  Jaundice [R17] Biliary stent obstruction [T85.590A] Patient Active Problem List   Diagnosis Date Noted   Biliary stent obstruction 45/80/9983   Acute metabolic encephalopathy 38/25/0539   Genetic testing 02/11/2022   Port-A-Cath in place 01/27/2022   Pancreatic carcinoma ([place]) 11/11/2021   Hyperbilirubinemia 10/03/2021   Leukocytosis 10/03/2021   Common bile duct stricture    Bile duct obstruction    Jaundice 09/07/2021   Gastroesophageal reflux disease 11/02/2017   Essential hypertension 09/15/2016   Mitral valve regurgitation 09/15/2016   Mixed hyperlipidemia 09/15/2016   PCP:  Abigail Miyamoto, [place] [facility]:   [id number] [place] [place] [address] Phone: [phone] Fax: [place], [place] [name] [place] [address] Phone: [phone] Fax: [place] [address] Phone:  [phone] Fax: [phone]     Social Determinants of Health (SDOH) Interventions    Readmission Risk Interventions   No data to display

## 2022-05-10 NOTE — Anesthesia Postprocedure Evaluation (Signed)
Anesthesia Post Note  Patient: Meagan [name]  Procedure(s) Performed: ENDOSCOPIC RETROGRADE CHOLANGIOPANCREATOGRAPHY (ERCP) REMOVAL OF STONES BILIARY STENT PLACEMENT     Patient location during evaluation: PACU Anesthesia Type: General Level of consciousness: awake and alert Pain management: pain level controlled Vital Signs Assessment: post-procedure vital signs reviewed and stable Respiratory status: spontaneous breathing, nonlabored ventilation, respiratory function stable and patient connected to nasal cannula oxygen Cardiovascular status: blood pressure returned to baseline and stable Postop Assessment: no apparent nausea or vomiting Anesthetic complications: no   No notable events documented.  Last Vitals:  Vitals:   05/09/22 0530 05/09/22 1149  BP: 132/78 (!) 145/72  Pulse: 73 73  Resp: 17 17  Temp: 36.6 C (!) 36.3 C  SpO2: 100% 100%    Last Pain:  Vitals:   05/09/22 0936  TempSrc:   PainSc: 0-No pain                 Meagan [name] S

## 2022-05-11 ENCOUNTER — Encounter (HOSPITAL_COMMUNITY): Payer: Self-pay | Admitting: Internal Medicine

## 2022-05-12 LAB — CULTURE, BLOOD (ROUTINE X 2)
Culture: NO GROWTH
Culture: NO GROWTH

## 2022-05-18 ENCOUNTER — Inpatient Hospital Stay: Payer: Medicare HMO

## 2022-05-18 ENCOUNTER — Inpatient Hospital Stay: Payer: Medicare HMO | Attending: Hematology | Admitting: Hematology

## 2022-05-18 ENCOUNTER — Encounter: Payer: Self-pay | Admitting: Hematology

## 2022-05-18 ENCOUNTER — Inpatient Hospital Stay: Payer: Medicare HMO | Admitting: Dietician

## 2022-05-18 VITALS — BP 169/90 | HR 82 | Temp 98.2°F | Resp 18

## 2022-05-18 DIAGNOSIS — Z95828 Presence of other vascular implants and grafts: Secondary | ICD-10-CM | POA: Diagnosis not present

## 2022-05-18 DIAGNOSIS — Z5111 Encounter for antineoplastic chemotherapy: Secondary | ICD-10-CM | POA: Diagnosis not present

## 2022-05-18 DIAGNOSIS — C259 Malignant neoplasm of pancreas, unspecified: Secondary | ICD-10-CM | POA: Diagnosis not present

## 2022-05-18 DIAGNOSIS — Z803 Family history of malignant neoplasm of breast: Secondary | ICD-10-CM | POA: Diagnosis not present

## 2022-05-18 DIAGNOSIS — Z8041 Family history of malignant neoplasm of ovary: Secondary | ICD-10-CM | POA: Insufficient documentation

## 2022-05-18 DIAGNOSIS — D649 Anemia, unspecified: Secondary | ICD-10-CM | POA: Insufficient documentation

## 2022-05-18 DIAGNOSIS — C250 Malignant neoplasm of head of pancreas: Secondary | ICD-10-CM | POA: Diagnosis not present

## 2022-05-18 LAB — COMPREHENSIVE METABOLIC PANEL
ALT: 63 U/L — ABNORMAL HIGH (ref 0–44)
AST: 47 U/L — ABNORMAL HIGH (ref 15–41)
Albumin: 3.1 g/dL — ABNORMAL LOW (ref 3.5–5.0)
Alkaline Phosphatase: 214 U/L — ABNORMAL HIGH (ref 38–126)
Anion gap: 7 (ref 5–15)
BUN: 28 mg/dL — ABNORMAL HIGH (ref 8–23)
CO2: 26 mmol/L (ref 22–32)
Calcium: 9.9 mg/dL (ref 8.9–10.3)
Chloride: 103 mmol/L (ref 98–111)
Creatinine, Ser: 1.2 mg/dL — ABNORMAL HIGH (ref 0.44–1.00)
GFR, Estimated: 44 mL/min — ABNORMAL LOW (ref >60)
Glucose, Bld: 110 mg/dL — ABNORMAL HIGH (ref 70–99)
Potassium: 4.2 mmol/L (ref 3.5–5.1)
Sodium: 136 mmol/L (ref 135–145)
Total Bilirubin: 1.8 mg/dL — ABNORMAL HIGH (ref 0.3–1.2)
Total Protein: 6.5 g/dL (ref 6.5–8.1)

## 2022-05-18 LAB — CBC WITH DIFFERENTIAL/PLATELET
Abs Immature Granulocytes: 0.02 10*3/uL (ref 0.00–0.07)
Basophils Absolute: 0.1 10*3/uL (ref 0.0–0.1)
Basophils Relative: 1 %
Eosinophils Absolute: 0.1 10*3/uL (ref 0.0–0.5)
Eosinophils Relative: 1 %
HCT: 29.8 % — ABNORMAL LOW (ref 36.0–46.0)
Hemoglobin: 9.3 g/dL — ABNORMAL LOW (ref 12.0–15.0)
Immature Granulocytes: 0 %
Lymphocytes Relative: 19 %
Lymphs Abs: 1.3 10*3/uL (ref 0.7–4.0)
MCH: 29.6 pg (ref 26.0–34.0)
MCHC: 31.2 g/dL (ref 30.0–36.0)
MCV: 94.9 fL (ref 80.0–100.0)
Monocytes Absolute: 0.6 10*3/uL (ref 0.1–1.0)
Monocytes Relative: 9 %
Neutro Abs: 4.8 10*3/uL (ref 1.7–7.7)
Neutrophils Relative %: 70 %
Platelets: 357 10*3/uL (ref 150–400)
RBC: 3.14 MIL/uL — ABNORMAL LOW (ref 3.87–5.11)
RDW: 18 % — ABNORMAL HIGH (ref 11.5–15.5)
WBC: 6.9 10*3/uL (ref 4.0–10.5)
nRBC: 0 % (ref 0.0–0.2)

## 2022-05-18 LAB — MAGNESIUM: Magnesium: 2.2 mg/dL (ref 1.7–2.4)

## 2022-05-18 MED ORDER — PALONOSETRON HCL INJECTION 0.25 MG/5ML
0.2500 mg | Freq: Once | INTRAVENOUS | Status: AC
  Administered 2022-05-18: 0.25 mg via INTRAVENOUS
  Filled 2022-05-18: qty 5

## 2022-05-18 MED ORDER — SODIUM CHLORIDE 0.9 % IV SOLN: Freq: Once | INTRAVENOUS | Status: AC

## 2022-05-18 MED ORDER — SODIUM CHLORIDE 0.9% FLUSH
10.0000 mL | INTRAVENOUS | Status: DC | PRN
  Administered 2022-05-18: 10 mL

## 2022-05-18 MED ORDER — HEPARIN SOD (PORK) LOCK FLUSH 100 UNIT/ML IV SOLN
500.0000 [IU] | Freq: Once | INTRAVENOUS | Status: AC | PRN
  Administered 2022-05-18: 500 [IU]

## 2022-05-18 MED ORDER — SODIUM CHLORIDE 0.9 % IV SOLN
500.0000 mg/m2 | Freq: Once | INTRAVENOUS | Status: AC
  Administered 2022-05-18: 760 mg via INTRAVENOUS
  Filled 2022-05-18: qty 19.99

## 2022-05-18 MED ORDER — PACLITAXEL PROTEIN-BOUND CHEMO INJECTION 100 MG
100.0000 mg/m2 | Freq: Once | INTRAVENOUS | Status: AC
  Administered 2022-05-18: 150 mg via INTRAVENOUS
  Filled 2022-05-18: qty 30

## 2022-05-18 MED ORDER — SODIUM CHLORIDE 0.9% FLUSH
10.0000 mL | Freq: Once | INTRAVENOUS | Status: AC
  Administered 2022-05-18: 10 mL via INTRAVENOUS

## 2022-05-18 MED ORDER — SODIUM CHLORIDE 0.9 % IV SOLN
10.0000 mg | Freq: Once | INTRAVENOUS | Status: AC
  Administered 2022-05-18: 10 mg via INTRAVENOUS
  Filled 2022-05-18: qty 10

## 2022-05-18 NOTE — Progress Notes (Signed)
Patient presents today for treatment and follow up visit with Dr. Delton Coombes. Vital signs within parameters for treatment. Total bilirubin 1.8. MD aware and labs reviewed.   Message received from A. Ouida Sills RN / Dr. Delton Coombes to proceed with treatment  Gemzar and Abraxane given today per MD orders. Tolerated infusion without adverse affects. Vital signs stable. No complaints at this time. Discharged from clinic by wheel chair in stable condition. Alert and oriented x 3. F/U with [facility] as scheduled.

## 2022-05-18 NOTE — Progress Notes (Signed)
Nutrition Assessment   Reason for Assessment: +MST    ASSESSMENT: 87 year old female stage IB pancreatic cancer. She is receiving abraxane, gemcitabine q28d (start 9/7). Patient is under the care of Dr. Delton Coombes.   Past medical history includes GERD, HTN, vertigo, HLD  Met with patient during infusion. Daughter is present for visit. Patient snacking on saltines and peanut butter. She reports her appetite is good and eating better since recent stent exchange. Patient endorses weight loss related to recent passing of her husband to dementia followed by Covid infection. She has been drinking one Ensure for added calories and protein. Patient had one episode of nausea this morning. This passed without use of medication. She denies constipation or diarrhea.    Medications: D3, CoQ10, amlodipine, pepcid, gabapentin, antivert, MVI, prilosec, zofran-odt, miralax, compazine, B6, zanaflex   Labs: glucose 110, BUN 28, Cr 1.20, Albumin 3.1, Hgb 9.3   Anthropometrics: Weights have increased 7 lbs from 109 lb 9.1 oz on 11/2  Height: 5'7" Weight: 114 lb 3.2 UBW: 122 lb (April)  BMI: 17.89   NUTRITION DIAGNOSIS: Unintentional weight loss related to acute/chronic illness (pancreatic cancer, covid infection) as evidenced by 6.5% decrease from usual weight in 7 months - insignificant for time frame however concerning given pt is underweight, advanced age, undergoing chemotherapy    INTERVENTION:  Continue eating 3 meals + 3 snacks with adequate calories and protein - snack ideas provided Continue drinking one Ensure Plus HP/equivalent - coupons provided Contact information given     MONITORING, EVALUATION, GOAL: Patient will tolerate increased calories and protein to promote weight gain    Next Visit: To be scheduled with treatment as needed

## 2022-05-18 NOTE — Progress Notes (Signed)
Meagan [name], [place] [address]   CLINIC:  Medical Oncology/Hematology  PCP:  Abigail Miyamoto, [place] / [place] [address] [phone]   REASON FOR VISIT:  Follow-up for pancreatic adenocarcinoma  PRIOR THERAPY: none  NGS Results: not done  CURRENT THERAPY: under work-up  BRIEF ONCOLOGIC HISTORY:  Oncology History  Pancreatic carcinoma ([place])  11/11/2021 Initial Diagnosis   Pancreatic carcinoma ([place])   02/05/2022 -  Chemotherapy   Patient is on Treatment Plan : PANCREATIC Abraxane D1,8,15 + Gemcitabine D1,8,15 q28d       CANCER STAGING:  Cancer Staging  Pancreatic carcinoma ([place]) Staging form: Exocrine Pancreas, AJCC 8th Edition - Clinical stage from 11/11/2021: Stage IB (cT2, cN0, cM0) - Unsigned   INTERVAL HISTORY:  [name], a 87 y.o. female, seen for follow-up of pancreatic cancer.  She was hospitalized from 05/06/2022 through 05/09/2022 at [facility] and underwent ERCP and stent placement in the common bile duct biliary stricture.  Overall she is feeling well.  She is eating well and has gained back her last weight.  She did not lose any weight from last visit.  Energy levels are 50%.  REVIEW OF SYSTEMS:  Review of Systems  Constitutional:  Negative for appetite change, fatigue and unexpected weight change.  Gastrointestinal:  Negative for abdominal pain.  Neurological:  Positive for numbness (Feet).  All other systems reviewed and are negative.   PAST MEDICAL/SURGICAL HISTORY:  Past Medical History:  Diagnosis Date   Essential tremor    GERD (gastroesophageal reflux disease)    High cholesterol    Hypertension    PONV (postoperative nausea and vomiting)    Port-A-Cath in place 01/27/2022   Vertigo    Past Surgical History:  Procedure Laterality Date   BILIARY BRUSHING  09/10/2021   Procedure: BILIARY BRUSHING;  Surgeon: Irene Shipper, MD;  Location: [facility] ENDOSCOPY;  Service:  Gastroenterology;;   BILIARY BRUSHING  10/29/2021   Procedure: BILIARY BRUSHING;  Surgeon: Irving Copas., MD;  Location: [facility] ENDOSCOPY;  Service: Gastroenterology;;   BILIARY DILATION  10/29/2021   Procedure: BILIARY DILATION;  Surgeon: Irving Copas., MD;  Location: [facility] ENDOSCOPY;  Service: Gastroenterology;;   BILIARY STENT PLACEMENT  09/10/2021   Procedure: BILIARY STENT PLACEMENT;  Surgeon: Irene Shipper, MD;  Location: [facility] ENDOSCOPY;  Service: Gastroenterology;;   BILIARY STENT PLACEMENT N/A 10/03/2021   Procedure: BILIARY STENT PLACEMENT;  Surgeon: Gatha Mayer, MD;  Location: [facility] ENDOSCOPY;  Service: Gastroenterology;  Laterality: N/A;   BILIARY STENT PLACEMENT N/A 10/29/2021   Procedure: BILIARY STENT PLACEMENT;  Surgeon: Rush Landmark Telford Nab., MD;  Location: [facility] ENDOSCOPY;  Service: Gastroenterology;  Laterality: N/A;   BILIARY STENT PLACEMENT N/A 05/08/2022   Procedure: BILIARY STENT PLACEMENT;  Surgeon: Gatha Mayer, MD;  Location: [facility] ENDOSCOPY;  Service: Gastroenterology;  Laterality: N/A;   BIOPSY  10/29/2021   Procedure: BIOPSY;  Surgeon: Rush Landmark Telford Nab., MD;  Location: [facility] ENDOSCOPY;  Service: Gastroenterology;;   ENDOSCOPIC RETROGRADE CHOLANGIOPANCREATOGRAPHY (ERCP) WITH PROPOFOL N/A 10/29/2021   Procedure: ENDOSCOPIC RETROGRADE CHOLANGIOPANCREATOGRAPHY (ERCP) WITH PROPOFOL;  Surgeon: Irving Copas., MD;  Location: [facility] ENDOSCOPY;  Service: Gastroenterology;  Laterality: N/A;   ERCP N/A 09/10/2021   Procedure: ENDOSCOPIC RETROGRADE CHOLANGIOPANCREATOGRAPHY (ERCP);  Surgeon: Irene Shipper, MD;  Location: [facility];  Service: Gastroenterology;  Laterality: N/A;   ERCP N/A 10/03/2021   Procedure: ENDOSCOPIC RETROGRADE CHOLANGIOPANCREATOGRAPHY (ERCP);  Surgeon: Gatha Mayer, MD;  Location:  [facility] ENDOSCOPY;  Service: Gastroenterology;  Laterality: N/A;   ERCP N/A 05/08/2022   Procedure: ENDOSCOPIC RETROGRADE CHOLANGIOPANCREATOGRAPHY (ERCP);  Surgeon:  Gatha Mayer, MD;  Location: [facility] ENDOSCOPY;  Service: Gastroenterology;  Laterality: N/A;   ESOPHAGOGASTRODUODENOSCOPY (EGD) WITH PROPOFOL N/A 10/29/2021   Procedure: ESOPHAGOGASTRODUODENOSCOPY (EGD) WITH PROPOFOL;  Surgeon: Rush Landmark Telford Nab., MD;  Location: [facility] ENDOSCOPY;  Service: Gastroenterology;  Laterality: N/A;   EUS N/A 10/29/2021   Procedure: UPPER ENDOSCOPIC ULTRASOUND (EUS) RADIAL;  Surgeon: Irving Copas., MD;  Location: [facility] ENDOSCOPY;  Service: Gastroenterology;  Laterality: N/A;   FINE NEEDLE ASPIRATION  10/29/2021   Procedure: FINE NEEDLE ASPIRATION (FNA) LINEAR;  Surgeon: Irving Copas., MD;  Location: [facility] ENDOSCOPY;  Service: Gastroenterology;;   IR IMAGING GUIDED PORT INSERTION  01/28/2022   PACEMAKER IMPLANT     REMOVAL OF STONES  10/29/2021   Procedure: REMOVAL OF SLUDGE;  Surgeon: Irving Copas., MD;  Location: [facility] ENDOSCOPY;  Service: Gastroenterology;;   Joan Mayans  09/10/2021   Procedure: Joan Mayans;  Surgeon: Irene Shipper, MD;  Location: [facility] ENDOSCOPY;  Service: Gastroenterology;;   Bess Kinds CHOLANGIOSCOPY N/A 10/29/2021   Procedure: [id number] CHOLANGIOSCOPY;  Surgeon: Irving Copas., MD;  Location: [facility] ENDOSCOPY;  Service: Gastroenterology;  Laterality: N/A;   STENT REMOVAL  10/03/2021   Procedure: STENT REMOVAL;  Surgeon: Gatha Mayer, MD;  Location: [facility] ENDOSCOPY;  Service: Gastroenterology;;    SOCIAL HISTORY:  Social History   Socioeconomic History   Marital status: Married    Spouse name: Not on file   Number of children: Not on file   Years of education: Not on file   Highest education level: Not on file  Occupational History   Not on file  Tobacco Use   Smoking status: Never   Smokeless tobacco: Never  Vaping Use   Vaping Use: Never used  Substance and Sexual Activity   Alcohol use: Never   Drug use: Never   Sexual activity: Not on file  Other Topics Concern   Not on file  Social History Narrative   Not  on file   Social Determinants of Health   Financial Resource Strain: Not on file  Food Insecurity: No Food Insecurity (05/07/2022)   Hunger Vital Sign    Worried About Running Out of Food in the Last Year: Never true    Ran Out of Food in the Last Year: Never true  Transportation Needs: No Transportation Needs (05/07/2022)   PRAPARE - Hydrologist (Medical): No    Lack of Transportation (Non-Medical): No  Physical Activity: Not on file  Stress: Not on file  Social Connections: Not on file  Intimate Partner Violence: Not At Risk (05/07/2022)   Humiliation, Afraid, Rape, and Kick questionnaire    Fear of Current or Ex-Partner: No    Emotionally Abused: No    Physically Abused: No    Sexually Abused: No    FAMILY HISTORY:  Family History  Problem Relation Age of Onset   Stomach cancer Neg Hx    Esophageal cancer Neg Hx    Colon cancer Neg Hx    Pancreatic cancer Neg Hx     CURRENT MEDICATIONS:  Current Outpatient Medications  Medication Sig Dispense Refill   amLODipine (NORVASC) 2.5 MG tablet Take 2.5 mg by mouth at bedtime.     aspirin EC 81 MG tablet Take 81 mg by mouth daily. Swallow whole.     Cholecalciferol (VITAMIN D3) 50 MCG (2000 UT) TABS  Take 2,000 Units by mouth daily after supper.     Coenzyme Q10 (CO Q-10) 100 MG CAPS Take 100 mg by mouth every evening.     famotidine (PEPCID) 40 MG tablet Take 40 mg by mouth every evening.     gabapentin (NEURONTIN) 100 MG capsule Take 100 mg by mouth 2 (two) times daily.     lidocaine-prilocaine (EMLA) cream Apply a small amount to port a cath site (do not rub in) and cover with plastic wrap 1 hour prior to infusion appointments 30 g 3   meclizine (ANTIVERT) 25 MG tablet Take 25 mg by mouth daily as needed for dizziness.     Misc Natural Products (JOINT SUPPORT) CAPS Take 1 capsule by mouth daily with breakfast.     Multiple Vitamin (MULTIVITAMIN) tablet Take 1 tablet by mouth daily with breakfast.      Multiple Vitamins-Minerals (OCUVITE EYE HEALTH FORMULA) CAPS Take 1 capsule by mouth daily.     nebivolol (BYSTOLIC) 5 MG tablet Take 2.5 mg by mouth in the morning.     omeprazole (PRILOSEC) 40 MG capsule Take 40 mg by mouth daily before breakfast.     polyethylene glycol (MIRALAX) 17 g packet Take 17 g by mouth daily as needed. (Patient taking differently: Take 17 g by mouth daily as needed for moderate constipation.) 14 each 0   Polyvinyl Alcohol-Povidone (REFRESH OP) Place 1 drop into both eyes daily as needed (tired eyes).     primidone (MYSOLINE) 50 MG tablet Take 25 mg by mouth daily as needed (for tremors).     prochlorperazine (COMPAZINE) 10 MG tablet Take 1 tablet (10 mg total) by mouth every 6 (six) hours as needed for nausea or vomiting. 60 tablet 6   pyridOXINE (VITAMIN B-6) 100 MG tablet Take 50 mg by mouth in the morning.     tiZANidine (ZANAFLEX) 2 MG tablet Take 1 mg by mouth daily as needed for muscle spasms.     No current facility-administered medications for this visit.   Facility-Administered Medications Ordered in Other Visits  Medication Dose Route Frequency Provider Last Rate Last Admin   sodium chloride flush (NS) 0.9 % injection 10 mL  10 mL Intracatheter PRN Derek Jack, MD   10 mL at 05/18/22 1653    ALLERGIES:  Allergies  Allergen Reactions   Ivp Dye [Iodinated Contrast Media] Hives   Lisinopril Cough    PHYSICAL EXAM:  Performance status (ECOG): 1 - Symptomatic but completely ambulatory  There were no vitals filed for this visit.  Wt Readings from Last 3 Encounters:  05/18/22 114 lb 3.2 oz (51.8 kg)  05/07/22 115 lb 4.8 oz (52.3 kg)  04/02/22 109 lb 9.1 oz (49.7 kg)   Physical Exam Vitals reviewed.  Constitutional:      Appearance: Normal appearance.  Cardiovascular:     Rate and Rhythm: Normal rate and regular rhythm.     Pulses: Normal pulses.     Heart sounds: Normal heart sounds.  Pulmonary:     Effort: Pulmonary effort is  normal.     Breath sounds: Normal breath sounds.  Neurological:     General: No focal deficit present.     Mental Status: She is alert and oriented to person, place, and time.  Psychiatric:        Mood and Affect: Mood normal.        Behavior: Behavior normal.      LABORATORY DATA:  I have reviewed the labs as listed.  Latest Ref Rng & Units 05/18/2022   12:38 PM 05/09/2022    5:31 AM 05/08/2022    5:26 AM  CBC  WBC 4.0 - 10.5 K/uL 6.9  11.7  9.8   Hemoglobin 12.0 - 15.0 g/dL 9.3  9.9  9.7   Hematocrit 36.0 - 46.0 % 29.8  30.9  30.1   Platelets 150 - 400 K/uL 357  310  254       Latest Ref Rng & Units 05/18/2022   12:38 PM 05/09/2022    5:31 AM 05/08/2022    5:26 AM  CMP  Glucose 70 - 99 mg/dL 110  152  93   BUN 8 - 23 mg/dL '28  19  19   '$ Creatinine 0.44 - 1.00 mg/dL 1.20  1.09  0.96   Sodium 135 - 145 mmol/L 136  136  133   Potassium 3.5 - 5.1 mmol/L 4.2  4.3  3.6   Chloride 98 - 111 mmol/L 103  103  102   CO2 22 - 32 mmol/L '26  28  25   '$ Calcium 8.9 - 10.3 mg/dL 9.9  9.2  8.7   Total Protein 6.5 - 8.1 g/dL 6.5  5.9  5.1   Total Bilirubin 0.3 - 1.2 mg/dL 1.8  3.0  3.5   Alkaline Phos 38 - 126 U/L 214  213  216   AST 15 - 41 U/L 47  31  34   ALT 0 - 44 U/L 63  50  54     DIAGNOSTIC IMAGING:  I have independently reviewed the scans and discussed with the patient. DG ERCP  Result Date: 05/09/2022 CLINICAL DATA:  Pancreatic cancer with common bile duct stent in place EXAM: ERCP TECHNIQUE: Multiple spot images obtained with the fluoroscopic device and submitted for interpretation post-procedure. FLUOROSCOPY: Radiation Exposure Index (as provided by the fluoroscopic device): 40.53 mGy Kerma COMPARISON:  CT scan of the abdomen and pelvis 05/06/2022 FINDINGS: Eight intraoperative saved images are submitted for review. The images demonstrate a flexible duodenal scope in the descending duodenum followed by successful cannulation of the existing biliary stent. Subsequent images  demonstrate cholangiography showing occlusion of the biliary stent. Balloon sweeping is then performed followed by second stent placement within the existing stent. IMPRESSION: 1. The occlusion of the existing biliary stent by what appears to be tumoral in growth. 2. Successful placement of a second stent coaxially within the first stent. These images were submitted for radiologic interpretation only. Please see the procedural report for the amount of contrast and the fluoroscopy time utilized. Electronically Signed   By: Jacqulynn Cadet M.D.   On: 05/09/2022 06:51   DG Chest Port 1 View  Result Date: 05/07/2022 CLINICAL DATA:  Questionable sepsis - evaluate for abnormality. EXAM: PORTABLE CHEST 1 VIEW COMPARISON:  09/08/2021 FINDINGS: Right Port-A-Cath in place with the tip at the cavoatrial junction. Left pacer remains in place, unchanged. Heart and mediastinal contours are within normal limits. No focal opacities or effusions. No acute bony abnormality. Aortic atherosclerosis. IMPRESSION: No active cardiopulmonary disease. Electronically Signed   By: Rolm Baptise M.D.   On: 05/07/2022 02:06   CT ABDOMEN PELVIS WO CONTRAST  Result Date: 05/06/2022 CLINICAL DATA:  Pancreatic cancer with CBD stent. Abdominal pain and jaundice. * Tracking Code: BO * EXAM: CT ABDOMEN AND PELVIS WITHOUT CONTRAST TECHNIQUE: Multidetector CT imaging of the abdomen and pelvis was performed following the standard protocol without IV contrast. RADIATION DOSE REDUCTION: This exam was performed according to  the departmental dose-optimization program which includes automated exposure control, adjustment of the mA and/or kV according to patient size and/or use of iterative reconstruction technique. COMPARISON:  Abdominal ultrasound 05/06/2022; CT abdomen 05/08/2022 FINDINGS: Lower chest: Mild scarring in the right lower lobe and mild subsegmental atelectasis in the left lower lobe with trace bilateral pleural effusions. Pacer leads  noted. Mild mitral valve calcification. Descending thoracic aortic atherosclerotic vascular calcification. Hepatobiliary: Pneumobilia noted favoring patency of the stent although components of the intrahepatic biliary tree are slightly more gas distended than they were on 01/16/2022. Portions of the stent or filled with intermediate density which is probably fluid but technically nonspecific. Portions the right hepatic lobe have mild intrahepatic biliary dilatation. Contracted gallbladder. Pancreas: The pancreatic head mass is poorly characterized on today's noncontrast examination. No substantial dorsal pancreatic duct dilatation. Spleen: Unremarkable Adrenals/Urinary Tract: Stable benign cyst exophytic from the right kidney lower pole. No further imaging workup of this lesion is indicated. Three punctate nonobstructive left renal calculi are present. No hydronephrosis or hydroureter. Urinary bladder unremarkable. Adrenal glands unremarkable. Stomach/Bowel: Unremarkable Vascular/Lymphatic: Atherosclerosis is present, including aortoiliac atherosclerotic disease. Reproductive: Unremarkable Other: Subtle edema in the perirenal spaces and mesentery as well as the subcutaneous tissues potentially from third spacing of fluid. Musculoskeletal: Lumbar spondylosis and degenerative disc disease causing mild multilevel impingement. IMPRESSION: 1. There is some very mild intrahepatic biliary dilatation along with pneumobilia. In light of the increasing serum bilirubin levels, this could reflect very early or partial stent malfunction/occlusion. Portions of the expandable CBD stent are filled with fluid or soft tissue density. 2. The pancreatic tumor itself is poorly characterized on today's noncontrast CT exam. 3. Likely mild third say pacing of fluid with low-grade mesenteric, retroperitoneal, and subcutaneous edema along with trace bilateral pleural effusions. 4. Nonobstructive left nephrolithiasis. 5. Aortic  Atherosclerosis (ICD10-I70.0). Mild mitral valve calcification. 6. Mild multilevel lumbar impingement. Electronically Signed   By: Van Clines M.D.   On: 05/06/2022 20:12   US Abdomen Limited RUQ (LIVER/GB)  Result Date: 05/06/2022 CLINICAL DATA:  Jaundice, history of common bile duct stent, pancreatic cancer EXAM: ULTRASOUND ABDOMEN LIMITED RIGHT UPPER QUADRANT COMPARISON:  01/16/2022 FINDINGS: Gallbladder: Gallbladder is moderately distended, with no evidence of cholelithiasis. Minimal echogenic sludge is identified. Borderline gallbladder wall thickening measuring 3 mm. Negative sonographic Murphy sign. No free fluid surrounding the gallbladder. Common bile duct: Diameter: Common bile duct stent is identified. The common bile duct measures up to 8 mm in diameter. Liver: Liver demonstrates normal echotexture. No focal lesion is identified. There is mild intrahepatic biliary duct dilation, not appreciably changed from prior CT. Ring down artifact consistent with pneumobilia, consistent with at least partial patency of the common bile duct stent. Portal vein is patent on color Doppler imaging with normal direction of blood flow towards the liver. Other: Stable exophytic simple 6 cm right renal cyst does not require follow-up. IMPRESSION: 1. Common bile duct stent as above. There is mild persistent intrahepatic biliary duct dilation, with evidence of pneumobilia suggesting at least partial patency of the biliary stent. If further evaluation of the stent is desired, CT could be performed. 2. Gallbladder sludge, with no evidence of cholelithiasis or acute cholecystitis. Electronically Signed   By: Randa Ngo M.D.   On: 05/06/2022 19:27     ASSESSMENT:  T2 N0 M1 pancreatic adenocarcinoma: - She presented with painless jaundice in April 2023.  Weight loss of 13 pounds since April. - Stent placement on 09/10/2021 for a bilirubin of 8.2. -  10/03/2021: ERCP and stent removal and stent placement for stent  dysfunction with total bilirubin 16.3. - 10/29/2021: ERCP/EUS: Lower CBD stent stenosis from stricture, status post metal stent placement.  EUS showed mass in the superior pancreatic head measuring 2.8 x 2.2 cm, suggesting abutment of the portal vein.  Intact interface was seen between lesion and SMA and celiac trunk suggesting lack of invasion.  Hypoechoic 14 x 12 mm lesion visualized in the liver.  Biopsy could not be attempted due to presence of blood vessel. - She had syncopal episodes in 07-07-2022, had pacemaker placement for bradycardia. - Pathology: Pancreatic head FNA and biliary stricture brushing: Malignant cells consistent with adenocarcinoma. - CA 19-9: 131 - We reviewed MRI of the abdomen with and without contrast (12/24/2021): Pancreatic head mass measures 2.7 x 2.3 cm (2.5 x 1.8 cm previously).  Mass is abutting and slightly displacing the SMA.  No obvious fat plane is demonstrated.  Portal and splenic veins are patent.  Stable small scattered hepatic cysts with no liver metastatic disease. - PET scan (11/20/2021): Ill-defined low-level hypermetabolic activity about the metallic CBD stent and pancreatic uncinate process without discrete CT correlate.  No convincing evidence of hypermetabolic disease in the neck, chest, abdomen or pelvis.  Tiny bilateral lung nodules measuring up to 3 mm.  -CT pancreatic protocol (01/16/2022): Hypoenhancing mass in the pancreatic head measuring 2.9 x 1.8 cm.  Slightly greater than 180 degrees of abutment with portal vein as well as SMA without definite vascular encasement.  New enhancing 9 mm soft tissue nodule along the posterior aspect of the gallbladder wall?  Suspicious for peritoneal implant. -I have discussed with Dr. Zenia Resides who has seen this patient. -Dr. Zenia Resides thought that this patient might not be a candidate for surgery but she would like to evaluate after 3 to 4 months of neoadjuvant chemotherapy. -We talked about neoadjuvant chemotherapy.  She is not a  candidate for FOLFIRINOX.  I have recommended gemcitabine and Abraxane every other week.  Cycle 1 was started on 02/05/2022.  Social/family history: - She lives at home with her husband who has dementia.  She worked as a Char[employer] fundraiser for 33 years.  Non-smoker and no exposure to chemicals.  She has not been driving since July 07, 2022. - Daughter died of glioblastoma.  Mother had ovarian cancer.  Maternal aunt had breast cancer.   PLAN:  T2 N0 M0 pancreatic adenocarcinoma: - She has completed 2 cycles of gemcitabine Abraxane, day 1 and day 15. - Last CEA 19-9 was 105. - I reviewed recent hospitalization records when she underwent ERCP and stent placement for biliary stricture in the common bile duct and in-stent stenosis requiring second stent placement. - Bilirubin has improved to 1.8 today.  AST is 47 and ALT is 63.  Albumin also increased to 3.1.  Creatinine is 1.20 and stable.  CBC was grossly normal with normocytic anemia with hemoglobin 9.3. - We will proceed with cycle 3-day 1 chemotherapy today.  RTC 2 weeks for follow-up.  2.  Weight loss: - Her weight is stable since last visit. - Continue Ensure with 30 g protein once daily.  She is eating 3 meals a day.    Orders placed this encounter:  No orders of the defined types were placed in this encounter.     Derek Jack, MD [place] [phone]

## 2022-05-18 NOTE — Patient Instructions (Addendum)
[facility] at [facility] Discharge Instructions   You were seen and examined today by Dr. Delton Coombes.  He reviewed the results of your lab work. Your bilirubin is down to 1.8. All other results are normal/stable.   We will proceed with your treatment today.  Return as scheduled.    Thank you for choosing [place] at [facility] to provide your oncology and hematology care.  To afford each patient quality time with our provider, please arrive at least 15 minutes before your scheduled appointment time.   If you have a lab appointment with the [facility] please come in thru the Main Entrance and check in at the main information desk.  You need to re-schedule your appointment should you arrive 10 or more minutes late.  We strive to give you quality time with our providers, and arriving late affects you and other patients whose appointments are after yours.  Also, if you no show three or more times for appointments you may be dismissed from the clinic at the providers discretion.     Again, thank you for choosing [facility].  Our hope is that these requests will decrease the amount of time that you wait before being seen by our physicians.       _____________________________________________________________  Should you have questions after your visit to [facility], please contact our office at [phone] and follow the prompts.  Our office hours are 8:00 a.m. and 4:30 p.m. Monday - Friday.  Please note that voicemails left after 4:00 p.m. may not be returned until the following business day.  We are closed weekends and major holidays.  You do have access to a nurse 24-7, just call the main number to the clinic [phone] and do not press any options, hold on the line and a nurse will answer the phone.    For prescription refill requests, have your pharmacy contact our office and allow 72 hours.    Due to Covid,  you will need to wear a mask upon entering the hospital. If you do not have a mask, a mask will be given to you at the Main Entrance upon arrival. For doctor visits, patients may have 1 support person age 83 or older with them. For treatment visits, patients can not have anyone with them due to social distancing guidelines and our immunocompromised population.

## 2022-05-18 NOTE — Progress Notes (Signed)
Patient has been examined by Dr. Delton Coombes, and vital signs and labs have been reviewed. ANC, Creatinine, LFTs (total bili 1.8), hemoglobin, and platelets are within treatment parameters per M.D. - pt may proceed with treatment.  Primary RN and pharmacy notified.

## 2022-05-18 NOTE — Patient Instructions (Signed)
[facility]  Discharge Instructions: Thank you for choosing [facility] to provide your oncology and hematology care.  If you have a lab appointment with the [facility], please come in thru the Main Entrance and check in at the main information desk.  Wear comfortable clothing and clothing appropriate for easy access to any Portacath or PICC line.   We strive to give you quality time with your provider. You may need to reschedule your appointment if you arrive late (15 or more minutes).  Arriving late affects you and other patients whose appointments are after yours.  Also, if you miss three or more appointments without notifying the office, you may be dismissed from the clinic at the provider's discretion.      For prescription refill requests, have your pharmacy contact our office and allow 72 hours for refills to be completed.    Today you received the following chemotherapy and/or immunotherapy agents Abraxane, Gemzar. Gemcitabine Injection What is this medication? GEMCITABINE (jem SYE ta been) treats some types of cancer. It works by slowing down the growth of cancer cells. This medicine may be used for other purposes; ask your health care provider or pharmacist if you have questions. COMMON BRAND NAME(S): Gemzar, Infugem What should I tell my care team before I take this medication? They need to know if you have any of these conditions: Blood disorders Infection Kidney disease Liver disease Lung or breathing disease, such as asthma or COPD Recent or ongoing radiation therapy An unusual or allergic reaction to gemcitabine, other medications, foods, dyes, or preservatives If you or your partner are pregnant or trying to get pregnant Breast-feeding How should I use this medication? This medication is injected into a vein. It is given by your care team in a hospital or clinic setting. Talk to your care team about the use of this medication in  children. Special care may be needed. Overdosage: If you think you have taken too much of this medicine contact a poison control center or emergency room at once. NOTE: This medicine is only for you. Do not share this medicine with others. What if I miss a dose? Keep appointments for follow-up doses. It is important not to miss your dose. Call your care team if you are unable to keep an appointment. What may interact with this medication? Interactions have not been studied. This list may not describe all possible interactions. Give your health care provider a list of all the medicines, herbs, non-prescription drugs, or dietary supplements you use. Also tell them if you smoke, drink alcohol, or use illegal drugs. Some items may interact with your medicine. What should I watch for while using this medication? Your condition will be monitored carefully while you are receiving this medication. This medication may make you feel generally unwell. This is not uncommon, as chemotherapy can affect healthy cells as well as cancer cells. Report any side effects. Continue your course of treatment even though you feel ill unless your care team tells you to stop. In some cases, you may be given additional medications to help with side effects. Follow all directions for their use. This medication may increase your risk of getting an infection. Call your care team for advice if you get a fever, chills, sore throat, or other symptoms of a cold or flu. Do not treat yourself. Try to avoid being around people who are sick. This medication may increase your risk to bruise or bleed. Call your care  team if you notice any unusual bleeding. Be careful brushing or flossing your teeth or using a toothpick because you may get an infection or bleed more easily. If you have any dental work done, tell your dentist you are receiving this medication. Avoid taking medications that contain aspirin, acetaminophen, ibuprofen, naproxen,  or ketoprofen unless instructed by your care team. These medications may hide a fever. Talk to your care team if you or your partner wish to become pregnant or think you might be pregnant. This medication can cause serious birth defects if taken during pregnancy and for 6 months after the last dose. A negative pregnancy test is required before starting this medication. A reliable form of contraception is recommended while taking this medication and for 6 months after the last dose. Talk to your care team about effective forms of contraception. Do not father a child while taking this medication and for 3 months after the last dose. Use a condom while having sex during this time period. Do not breastfeed while taking this medication and for at least 1 week after the last dose. This medication may cause infertility. Talk to your care team if you are concerned about your fertility. What side effects may I notice from receiving this medication? Side effects that you should report to your care team as soon as possible: Allergic reactions--skin rash, itching, hives, swelling of the face, lips, tongue, or throat Capillary leak syndrome--stomach or muscle pain, unusual weakness or fatigue, feeling faint or lightheaded, decrease in the amount of urine, swelling of the ankles, hands, or feet, trouble breathing Infection--fever, chills, cough, sore throat, wounds that don't heal, pain or trouble when passing urine, general feeling of discomfort or being unwell Liver injury--right upper belly pain, loss of appetite, nausea, light-colored stool, dark yellow or brown urine, yellowing skin or eyes, unusual weakness or fatigue Low red blood cell level--unusual weakness or fatigue, dizziness, headache, trouble breathing Lung injury--shortness of breath or trouble breathing, cough, spitting up blood, chest pain, fever Stomach pain, bloody diarrhea, pale skin, unusual weakness or fatigue, decrease in the amount of urine,  which may be signs of hemolytic uremic syndrome Sudden and severe headache, confusion, change in vision, seizures, which may be signs of posterior reversible encephalopathy syndrome (PRES) Unusual bruising or bleeding Side effects that usually do not require medical attention (report to your care team if they continue or are bothersome): Diarrhea Drowsiness Hair loss Nausea Pain, redness, or swelling with sores inside the mouth or throat Vomiting This list may not describe all possible side effects. Call your doctor for medical advice about side effects. You may report side effects to FDA at 1-800-FDA-1088. Where should I keep my medication? This medication is given in a hospital or clinic. It will not be stored at home. NOTE: This sheet is a summary. It may not cover all possible information. If you have questions about this medicine, talk to your doctor, pharmacist, or health care provider.  2023 Elsevier/Gold Standard (2007-07-09 00:00:00) Paclitaxel Nanoparticle Albumin-Bound Injection What is this medication? NANOPARTICLE ALBUMIN-BOUND PACLITAXEL (Na no PAHR ti kuhl al BYOO muhn-bound PAK li TAX el) treats some types of cancer. It works by slowing down the growth of cancer cells. This medicine may be used for other purposes; ask your health care provider or pharmacist if you have questions. COMMON BRAND NAME(S): Abraxane What should I tell my care team before I take this medication? They need to know if you have any of these conditions: Liver disease Low  white blood cell levels An unusual or allergic reaction to paclitaxel, albumin, other medications, foods, dyes, or preservatives If you or your partner are pregnant or trying to get pregnant Breast-feeding How should I use this medication? This medication is injected into a vein. It is given by your care team in a hospital or clinic setting. Talk to your care team about the use of this medication in children. Special care may be  needed. Overdosage: If you think you have taken too much of this medicine contact a poison control center or emergency room at once. NOTE: This medicine is only for you. Do not share this medicine with others. What if I miss a dose? Keep appointments for follow-up doses. It is important not to miss your dose. Call your care team if you are unable to keep an appointment. What may interact with this medication? Other medications may affect the way this medication works. Talk with your care team about all of the medications you take. They may suggest changes to your treatment plan to lower the risk of side effects and to make sure your medications work as intended. This list may not describe all possible interactions. Give your health care provider a list of all the medicines, herbs, non-prescription drugs, or dietary supplements you use. Also tell them if you smoke, drink alcohol, or use illegal drugs. Some items may interact with your medicine. What should I watch for while using this medication? Your condition will be monitored carefully while you are receiving this medication. You may need blood work while taking this medication. This medication may make you feel generally unwell. This is not uncommon as chemotherapy can affect healthy cells as well as cancer cells. Report any side effects. Continue your course of treatment even though you feel ill unless your care team tells you to stop. This medication can cause serious allergic reactions. To reduce the risk, your care team may give you other medications to take before receiving this one. Be sure to follow the directions from your care team. This medication may increase your risk of getting an infection. Call your care team for advice if you get a fever, chills, sore throat, or other symptoms of a cold or flu. Do not treat yourself. Try to avoid being around people who are sick. This medication may increase your risk to bruise or bleed. Call your  care team if you notice any unusual bleeding. Be careful brushing or flossing your teeth or using a toothpick because you may get an infection or bleed more easily. If you have any dental work done, tell your dentist you are receiving this medication. Talk to your care team if you or your partner may be pregnant. Serious birth defects can occur if you take this medication during pregnancy and for 6 months after the last dose. You will need a negative pregnancy test before starting this medication. Contraception is recommended while taking this medication and for 6 months after the last dose. Your care team can help you find the option that works for you. If your partner can get pregnant, use a condom during sex while taking this medication and for 3 months after the last dose. Do not breastfeed while taking this medication and for 2 weeks after the last dose. This medication may cause infertility. Talk to your care team if you are concerned about your fertility. What side effects may I notice from receiving this medication? Side effects that you should report to your care team as  soon as possible: Allergic reactions--skin rash, itching, hives, swelling of the face, lips, tongue, or throat Dry cough, shortness of breath or trouble breathing Infection--fever, chills, cough, sore throat, wounds that don't heal, pain or trouble when passing urine, general feeling of discomfort or being unwell Low red blood cell level--unusual weakness or fatigue, dizziness, headache, trouble breathing Pain, tingling, or numbness in the hands or feet Stomach pain, unusual weakness or fatigue, nausea, vomiting, diarrhea, or fever that lasts longer than expected Unusual bruising or bleeding Side effects that usually do not require medical attention (report to your care team if they continue or are bothersome): Diarrhea Fatigue Hair loss Loss of appetite Nausea Vomiting This list may not describe all possible side  effects. Call your doctor for medical advice about side effects. You may report side effects to FDA at 1-800-FDA-1088. Where should I keep my medication? This medication is given in a hospital or clinic. It will not be stored at home. NOTE: This sheet is a summary. It may not cover all possible information. If you have questions about this medicine, talk to your doctor, pharmacist, or health care provider.  2023 Elsevier/Gold Standard (2007-07-09 00:00:00)       To help prevent nausea and vomiting after your treatment, we encourage you to take your nausea medication as directed.  BELOW ARE SYMPTOMS THAT SHOULD BE REPORTED IMMEDIATELY: *FEVER GREATER THAN 100.4 F (38 C) OR HIGHER *CHILLS OR SWEATING *NAUSEA AND VOMITING THAT IS NOT CONTROLLED WITH YOUR NAUSEA MEDICATION *UNUSUAL SHORTNESS OF BREATH *UNUSUAL BRUISING OR BLEEDING *URINARY PROBLEMS (pain or burning when urinating, or frequent urination) *BOWEL PROBLEMS (unusual diarrhea, constipation, pain near the anus) TENDERNESS IN MOUTH AND THROAT WITH OR WITHOUT PRESENCE OF ULCERS (sore throat, sores in mouth, or a toothache) UNUSUAL RASH, SWELLING OR PAIN  UNUSUAL VAGINAL DISCHARGE OR ITCHING   Items with * indicate a potential emergency and should be followed up as soon as possible or go to the Emergency Department if any problems should occur.  Please show the CHEMOTHERAPY ALERT CARD or IMMUNOTHERAPY ALERT CARD at check-in to the Emergency Department and triage nurse.  Should you have questions after your visit or need to cancel or reschedule your appointment, please contact [facility] [phone]  and follow the prompts.  Office hours are 8:00 a.m. to 4:30 p.m. Monday - Friday. Please note that voicemails left after 4:00 p.m. may not be returned until the following business day.  We are closed weekends and major holidays. You have access to a nurse at all times for urgent questions. Please call the main number  to the clinic [phone] and follow the prompts.  For any non-urgent questions, you may also contact your provider using MyChart. We now offer e-Visits for anyone 44 and older to request care online for non-urgent symptoms. For details visit [email or web address].   Also download the MyChart app! Go to the app store, search "MyChart", open the app, select [facility], and log in with your MyChart username and password.  Masks are optional in the cancer centers. If you would like for your care team to wear a mask while they are taking care of you, please let them know. You may have one support person who is at least 85 years old accompany you for your appointments.

## 2022-06-03 ENCOUNTER — Inpatient Hospital Stay (HOSPITAL_BASED_OUTPATIENT_CLINIC_OR_DEPARTMENT_OTHER): Payer: Medicare HMO | Admitting: Hematology

## 2022-06-03 ENCOUNTER — Inpatient Hospital Stay: Payer: Medicare HMO

## 2022-06-03 ENCOUNTER — Inpatient Hospital Stay: Payer: Medicare HMO | Attending: Hematology

## 2022-06-03 VITALS — BP 166/71 | HR 82 | Temp 98.1°F | Resp 18

## 2022-06-03 DIAGNOSIS — I10 Essential (primary) hypertension: Secondary | ICD-10-CM | POA: Diagnosis not present

## 2022-06-03 DIAGNOSIS — D649 Anemia, unspecified: Secondary | ICD-10-CM

## 2022-06-03 DIAGNOSIS — R634 Abnormal weight loss: Secondary | ICD-10-CM | POA: Diagnosis not present

## 2022-06-03 DIAGNOSIS — Z5111 Encounter for antineoplastic chemotherapy: Secondary | ICD-10-CM | POA: Insufficient documentation

## 2022-06-03 DIAGNOSIS — C250 Malignant neoplasm of head of pancreas: Secondary | ICD-10-CM | POA: Insufficient documentation

## 2022-06-03 DIAGNOSIS — Z95828 Presence of other vascular implants and grafts: Secondary | ICD-10-CM

## 2022-06-03 DIAGNOSIS — C259 Malignant neoplasm of pancreas, unspecified: Secondary | ICD-10-CM

## 2022-06-03 LAB — CBC WITH DIFFERENTIAL/PLATELET
Abs Immature Granulocytes: 0.02 10*3/uL (ref 0.00–0.07)
Basophils Absolute: 0.1 10*3/uL (ref 0.0–0.1)
Basophils Relative: 1 %
Eosinophils Absolute: 0.2 10*3/uL (ref 0.0–0.5)
Eosinophils Relative: 4 %
HCT: 30.3 % — ABNORMAL LOW (ref 36.0–46.0)
Hemoglobin: 9.6 g/dL — ABNORMAL LOW (ref 12.0–15.0)
Immature Granulocytes: 0 %
Lymphocytes Relative: 22 %
Lymphs Abs: 1.2 10*3/uL (ref 0.7–4.0)
MCH: 29.5 pg (ref 26.0–34.0)
MCHC: 31.7 g/dL (ref 30.0–36.0)
MCV: 93.2 fL (ref 80.0–100.0)
Monocytes Absolute: 0.8 10*3/uL (ref 0.1–1.0)
Monocytes Relative: 15 %
Neutro Abs: 3.2 10*3/uL (ref 1.7–7.7)
Neutrophils Relative %: 58 %
Platelets: 340 10*3/uL (ref 150–400)
RBC: 3.25 MIL/uL — ABNORMAL LOW (ref 3.87–5.11)
RDW: 16.6 % — ABNORMAL HIGH (ref 11.5–15.5)
WBC: 5.5 10*3/uL (ref 4.0–10.5)
nRBC: 0 % (ref 0.0–0.2)

## 2022-06-03 LAB — COMPREHENSIVE METABOLIC PANEL
ALT: 33 U/L (ref 0–44)
AST: 32 U/L (ref 15–41)
Albumin: 3.3 g/dL — ABNORMAL LOW (ref 3.5–5.0)
Alkaline Phosphatase: 155 U/L — ABNORMAL HIGH (ref 38–126)
Anion gap: 7 (ref 5–15)
BUN: 31 mg/dL — ABNORMAL HIGH (ref 8–23)
CO2: 24 mmol/L (ref 22–32)
Calcium: 9.8 mg/dL (ref 8.9–10.3)
Chloride: 104 mmol/L (ref 98–111)
Creatinine, Ser: 0.99 mg/dL (ref 0.44–1.00)
GFR, Estimated: 56 mL/min — ABNORMAL LOW (ref >60)
Glucose, Bld: 111 mg/dL — ABNORMAL HIGH (ref 70–99)
Potassium: 4.2 mmol/L (ref 3.5–5.1)
Sodium: 135 mmol/L (ref 135–145)
Total Bilirubin: 1.2 mg/dL (ref 0.3–1.2)
Total Protein: 6.5 g/dL (ref 6.5–8.1)

## 2022-06-03 LAB — MAGNESIUM: Magnesium: 2.1 mg/dL (ref 1.7–2.4)

## 2022-06-03 MED ORDER — PALONOSETRON HCL INJECTION 0.25 MG/5ML
0.2500 mg | Freq: Once | INTRAVENOUS | Status: AC
  Administered 2022-06-03: 0.25 mg via INTRAVENOUS
  Filled 2022-06-03: qty 5

## 2022-06-03 MED ORDER — SODIUM CHLORIDE 0.9 % IV SOLN: Freq: Once | INTRAVENOUS | Status: AC

## 2022-06-03 MED ORDER — HEPARIN SOD (PORK) LOCK FLUSH 100 UNIT/ML IV SOLN
500.0000 [IU] | Freq: Once | INTRAVENOUS | Status: AC | PRN
  Administered 2022-06-03: 500 [IU]

## 2022-06-03 MED ORDER — SODIUM CHLORIDE 0.9% FLUSH
10.0000 mL | INTRAVENOUS | Status: DC | PRN
  Administered 2022-06-03: 10 mL via INTRAVENOUS

## 2022-06-03 MED ORDER — SODIUM CHLORIDE 0.9 % IV SOLN
10.0000 mg | Freq: Once | INTRAVENOUS | Status: AC
  Administered 2022-06-03: 10 mg via INTRAVENOUS
  Filled 2022-06-03: qty 10

## 2022-06-03 MED ORDER — SODIUM CHLORIDE 0.9 % IV SOLN
500.0000 mg/m2 | Freq: Once | INTRAVENOUS | Status: AC
  Administered 2022-06-03: 760 mg via INTRAVENOUS
  Filled 2022-06-03: qty 19.99

## 2022-06-03 MED ORDER — PACLITAXEL PROTEIN-BOUND CHEMO INJECTION 100 MG
100.0000 mg/m2 | Freq: Once | INTRAVENOUS | Status: AC
  Administered 2022-06-03: 150 mg via INTRAVENOUS
  Filled 2022-06-03: qty 30

## 2022-06-03 MED ORDER — SODIUM CHLORIDE 0.9% FLUSH
10.0000 mL | INTRAVENOUS | Status: DC | PRN
  Administered 2022-06-03: 10 mL

## 2022-06-03 NOTE — Progress Notes (Signed)
Patient presents today for treatment ( Gemzar and Abraxane) with follow up visit with Dr. Delton Coombes. Labs within parameters for treatment. Vital signs within parameters for treatment.   Message received from A. Ouida Sills RN / Dr. Delton Coombes to proceed with treatment. Orders received to draw CA-19-9 prior to starting treatment.   Treatment given today per MD orders. Tolerated infusion without adverse affects. Vital signs stable. No complaints at this time. Discharged from clinic by wheel chair in stable condition. Alert and oriented x 3. F/U with [facility] as scheduled.

## 2022-06-03 NOTE — Progress Notes (Signed)
Patients port flushed without difficulty.  Good blood return noted with no bruising or swelling noted at site.  Patient remains accessed for chemotherapy treatment.  

## 2022-06-03 NOTE — Patient Instructions (Signed)
[facility]  Discharge Instructions: Thank you for choosing [facility] to provide your oncology and hematology care.  If you have a lab appointment with the [facility], please come in thru the Main Entrance and check in at the main information desk.  Wear comfortable clothing and clothing appropriate for easy access to any Portacath or PICC line.   We strive to give you quality time with your provider. You may need to reschedule your appointment if you arrive late (15 or more minutes).  Arriving late affects you and other patients whose appointments are after yours.  Also, if you miss three or more appointments without notifying the office, you may be dismissed from the clinic at the provider's discretion.      For prescription refill requests, have your pharmacy contact our office and allow 72 hours for refills to be completed.    Today you received the following chemotherapy and/or immunotherapy agents Gemzar, Abraxane. Paclitaxel Nanoparticle Albumin-Bound Injection What is this medication? NANOPARTICLE ALBUMIN-BOUND PACLITAXEL (Na no PAHR ti kuhl al BYOO muhn-bound PAK li TAX el) treats some types of cancer. It works by slowing down the growth of cancer cells. This medicine may be used for other purposes; ask your health care provider or pharmacist if you have questions. COMMON BRAND NAME(S): Abraxane What should I tell my care team before I take this medication? They need to know if you have any of these conditions: Liver disease Low white blood cell levels An unusual or allergic reaction to paclitaxel, albumin, other medications, foods, dyes, or preservatives If you or your partner are pregnant or trying to get pregnant Breast-feeding How should I use this medication? This medication is injected into a vein. It is given by your care team in a hospital or clinic setting. Talk to your care team about the use of this medication in children. Special  care may be needed. Overdosage: If you think you have taken too much of this medicine contact a poison control center or emergency room at once. NOTE: This medicine is only for you. Do not share this medicine with others. What if I miss a dose? Keep appointments for follow-up doses. It is important not to miss your dose. Call your care team if you are unable to keep an appointment. What may interact with this medication? Other medications may affect the way this medication works. Talk with your care team about all of the medications you take. They may suggest changes to your treatment plan to lower the risk of side effects and to make sure your medications work as intended. This list may not describe all possible interactions. Give your health care provider a list of all the medicines, herbs, non-prescription drugs, or dietary supplements you use. Also tell them if you smoke, drink alcohol, or use illegal drugs. Some items may interact with your medicine. What should I watch for while using this medication? Your condition will be monitored carefully while you are receiving this medication. You may need blood work while taking this medication. This medication may make you feel generally unwell. This is not uncommon as chemotherapy can affect healthy cells as well as cancer cells. Report any side effects. Continue your course of treatment even though you feel ill unless your care team tells you to stop. This medication can cause serious allergic reactions. To reduce the risk, your care team may give you other medications to take before receiving this one. Be sure to follow the directions  from your care team. This medication may increase your risk of getting an infection. Call your care team for advice if you get a fever, chills, sore throat, or other symptoms of a cold or flu. Do not treat yourself. Try to avoid being around people who are sick. This medication may increase your risk to bruise or bleed.  Call your care team if you notice any unusual bleeding. Be careful brushing or flossing your teeth or using a toothpick because you may get an infection or bleed more easily. If you have any dental work done, tell your dentist you are receiving this medication. Talk to your care team if you or your partner may be pregnant. Serious birth defects can occur if you take this medication during pregnancy and for 6 months after the last dose. You will need a negative pregnancy test before starting this medication. Contraception is recommended while taking this medication and for 6 months after the last dose. Your care team can help you find the option that works for you. If your partner can get pregnant, use a condom during sex while taking this medication and for 3 months after the last dose. Do not breastfeed while taking this medication and for 2 weeks after the last dose. This medication may cause infertility. Talk to your care team if you are concerned about your fertility. What side effects may I notice from receiving this medication? Side effects that you should report to your care team as soon as possible: Allergic reactions--skin rash, itching, hives, swelling of the face, lips, tongue, or throat Dry cough, shortness of breath or trouble breathing Infection--fever, chills, cough, sore throat, wounds that don't heal, pain or trouble when passing urine, general feeling of discomfort or being unwell Low red blood cell level--unusual weakness or fatigue, dizziness, headache, trouble breathing Pain, tingling, or numbness in the hands or feet Stomach pain, unusual weakness or fatigue, nausea, vomiting, diarrhea, or fever that lasts longer than expected Unusual bruising or bleeding Side effects that usually do not require medical attention (report to your care team if they continue or are bothersome): Diarrhea Fatigue Hair loss Loss of appetite Nausea Vomiting This list may not describe all possible  side effects. Call your doctor for medical advice about side effects. You may report side effects to FDA at 1-800-FDA-1088. Where should I keep my medication? This medication is given in a hospital or clinic. It will not be stored at home. NOTE: This sheet is a summary. It may not cover all possible information. If you have questions about this medicine, talk to your doctor, pharmacist, or health care provider.  2023 Elsevier/Gold Standard (2007-07-09 00:00:00) Gemcitabine Injection What is this medication? GEMCITABINE (jem SYE ta been) treats some types of cancer. It works by slowing down the growth of cancer cells. This medicine may be used for other purposes; ask your health care provider or pharmacist if you have questions. COMMON BRAND NAME(S): Gemzar, Infugem What should I tell my care team before I take this medication? They need to know if you have any of these conditions: Blood disorders Infection Kidney disease Liver disease Lung or breathing disease, such as asthma or COPD Recent or ongoing radiation therapy An unusual or allergic reaction to gemcitabine, other medications, foods, dyes, or preservatives If you or your partner are pregnant or trying to get pregnant Breast-feeding How should I use this medication? This medication is injected into a vein. It is given by your care team in a hospital or clinic  setting. Talk to your care team about the use of this medication in children. Special care may be needed. Overdosage: If you think you have taken too much of this medicine contact a poison control center or emergency room at once. NOTE: This medicine is only for you. Do not share this medicine with others. What if I miss a dose? Keep appointments for follow-up doses. It is important not to miss your dose. Call your care team if you are unable to keep an appointment. What may interact with this medication? Interactions have not been studied. This list may not describe all  possible interactions. Give your health care provider a list of all the medicines, herbs, non-prescription drugs, or dietary supplements you use. Also tell them if you smoke, drink alcohol, or use illegal drugs. Some items may interact with your medicine. What should I watch for while using this medication? Your condition will be monitored carefully while you are receiving this medication. This medication may make you feel generally unwell. This is not uncommon, as chemotherapy can affect healthy cells as well as cancer cells. Report any side effects. Continue your course of treatment even though you feel ill unless your care team tells you to stop. In some cases, you may be given additional medications to help with side effects. Follow all directions for their use. This medication may increase your risk of getting an infection. Call your care team for advice if you get a fever, chills, sore throat, or other symptoms of a cold or flu. Do not treat yourself. Try to avoid being around people who are sick. This medication may increase your risk to bruise or bleed. Call your care team if you notice any unusual bleeding. Be careful brushing or flossing your teeth or using a toothpick because you may get an infection or bleed more easily. If you have any dental work done, tell your dentist you are receiving this medication. Avoid taking medications that contain aspirin, acetaminophen, ibuprofen, naproxen, or ketoprofen unless instructed by your care team. These medications may hide a fever. Talk to your care team if you or your partner wish to become pregnant or think you might be pregnant. This medication can cause serious birth defects if taken during pregnancy and for 6 months after the last dose. A negative pregnancy test is required before starting this medication. A reliable form of contraception is recommended while taking this medication and for 6 months after the last dose. Talk to your care team about  effective forms of contraception. Do not father a child while taking this medication and for 3 months after the last dose. Use a condom while having sex during this time period. Do not breastfeed while taking this medication and for at least 1 week after the last dose. This medication may cause infertility. Talk to your care team if you are concerned about your fertility. What side effects may I notice from receiving this medication? Side effects that you should report to your care team as soon as possible: Allergic reactions--skin rash, itching, hives, swelling of the face, lips, tongue, or throat Capillary leak syndrome--stomach or muscle pain, unusual weakness or fatigue, feeling faint or lightheaded, decrease in the amount of urine, swelling of the ankles, hands, or feet, trouble breathing Infection--fever, chills, cough, sore throat, wounds that don't heal, pain or trouble when passing urine, general feeling of discomfort or being unwell Liver injury--right upper belly pain, loss of appetite, nausea, light-colored stool, dark yellow or brown urine, yellowing skin  or eyes, unusual weakness or fatigue Low red blood cell level--unusual weakness or fatigue, dizziness, headache, trouble breathing Lung injury--shortness of breath or trouble breathing, cough, spitting up blood, chest pain, fever Stomach pain, bloody diarrhea, pale skin, unusual weakness or fatigue, decrease in the amount of urine, which may be signs of hemolytic uremic syndrome Sudden and severe headache, confusion, change in vision, seizures, which may be signs of posterior reversible encephalopathy syndrome (PRES) Unusual bruising or bleeding Side effects that usually do not require medical attention (report to your care team if they continue or are bothersome): Diarrhea Drowsiness Hair loss Nausea Pain, redness, or swelling with sores inside the mouth or throat Vomiting This list may not describe all possible side effects. Call  your doctor for medical advice about side effects. You may report side effects to FDA at 1-800-FDA-1088. Where should I keep my medication? This medication is given in a hospital or clinic. It will not be stored at home. NOTE: This sheet is a summary. It may not cover all possible information. If you have questions about this medicine, talk to your doctor, pharmacist, or health care provider.  2023 Elsevier/Gold Standard (2007-07-09 00:00:00)       To help prevent nausea and vomiting after your treatment, we encourage you to take your nausea medication as directed.  BELOW ARE SYMPTOMS THAT SHOULD BE REPORTED IMMEDIATELY: *FEVER GREATER THAN 100.4 F (38 C) OR HIGHER *CHILLS OR SWEATING *NAUSEA AND VOMITING THAT IS NOT CONTROLLED WITH YOUR NAUSEA MEDICATION *UNUSUAL SHORTNESS OF BREATH *UNUSUAL BRUISING OR BLEEDING *URINARY PROBLEMS (pain or burning when urinating, or frequent urination) *BOWEL PROBLEMS (unusual diarrhea, constipation, pain near the anus) TENDERNESS IN MOUTH AND THROAT WITH OR WITHOUT PRESENCE OF ULCERS (sore throat, sores in mouth, or a toothache) UNUSUAL RASH, SWELLING OR PAIN  UNUSUAL VAGINAL DISCHARGE OR ITCHING   Items with * indicate a potential emergency and should be followed up as soon as possible or go to the Emergency Department if any problems should occur.  Please show the CHEMOTHERAPY ALERT CARD or IMMUNOTHERAPY ALERT CARD at check-in to the Emergency Department and triage nurse.  Should you have questions after your visit or need to cancel or reschedule your appointment, please contact [facility] [phone]  and follow the prompts.  Office hours are 8:00 a.m. to 4:30 p.m. Monday - Friday. Please note that voicemails left after 4:00 p.m. may not be returned until the following business day.  We are closed weekends and major holidays. You have access to a nurse at all times for urgent questions. Please call the main number to the clinic  [phone] and follow the prompts.  For any non-urgent questions, you may also contact your provider using MyChart. We now offer e-Visits for anyone 73 and older to request care online for non-urgent symptoms. For details visit [email or web address].   Also download the MyChart app! Go to the app store, search "MyChart", open the app, select [facility], and log in with your MyChart username and password.

## 2022-06-03 NOTE — Progress Notes (Signed)
[place] [place], [place] [address]   CLINIC:  Medical Oncology/Hematology  PCP:  Meagan [name], [place] / [place] [address] [phone]   REASON FOR VISIT:  Follow-up for pancreatic adenocarcinoma  PRIOR THERAPY: none  NGS Results: not done  CURRENT THERAPY: under work-up  BRIEF ONCOLOGIC HISTORY:  Oncology History  Pancreatic carcinoma ([place])  11/11/2021 Initial Diagnosis   Pancreatic carcinoma ([place])   02/05/2022 -  Chemotherapy   Patient is on Treatment Plan : PANCREATIC Abraxane D1,15 + Gemcitabine D1,15 q28d       CANCER STAGING:  Cancer Staging  Pancreatic carcinoma ([place]) Staging form: Exocrine Pancreas, AJCC 8th Edition - Clinical stage from 11/11/2021: Stage IB (cT2, cN0, cM0) - Unsigned   INTERVAL HISTORY:  [name], a 88 y.o. female, seen for follow-up of pancreatic cancer.  She has tolerated last cycle very well.  Energy levels are 75%.  She felt very good the next day after treatment.  Denies any GI side effects.  She gained 2.4 pounds and is eating 3 meals per day and 1 Ensure high-protein per day.  REVIEW OF SYSTEMS:  Review of Systems  Constitutional:  Negative for appetite change, fatigue and unexpected weight change.  Gastrointestinal:  Negative for abdominal pain.  All other systems reviewed and are negative.   PAST MEDICAL/SURGICAL HISTORY:  Past Medical History:  Diagnosis Date   Essential tremor    GERD (gastroesophageal reflux disease)    High cholesterol    Hypertension    PONV (postoperative nausea and vomiting)    Port-A-Cath in place 01/27/2022   Vertigo    Past Surgical History:  Procedure Laterality Date   BILIARY BRUSHING  09/10/2021   Procedure: BILIARY BRUSHING;  Surgeon: Irene Shipper, MD;  Location: [facility] ENDOSCOPY;  Service: Gastroenterology;;   BILIARY BRUSHING  10/29/2021   Procedure: BILIARY BRUSHING;  Surgeon: Irving Copas., MD;  Location: [facility]  ENDOSCOPY;  Service: Gastroenterology;;   BILIARY DILATION  10/29/2021   Procedure: BILIARY DILATION;  Surgeon: Irving Copas., MD;  Location: [facility] ENDOSCOPY;  Service: Gastroenterology;;   BILIARY STENT PLACEMENT  09/10/2021   Procedure: BILIARY STENT PLACEMENT;  Surgeon: Irene Shipper, MD;  Location: [facility] ENDOSCOPY;  Service: Gastroenterology;;   BILIARY STENT PLACEMENT N/A 10/03/2021   Procedure: BILIARY STENT PLACEMENT;  Surgeon: Gatha Mayer, MD;  Location: [facility] ENDOSCOPY;  Service: Gastroenterology;  Laterality: N/A;   BILIARY STENT PLACEMENT N/A 10/29/2021   Procedure: BILIARY STENT PLACEMENT;  Surgeon: Rush Landmark Telford Nab., MD;  Location: [facility] ENDOSCOPY;  Service: Gastroenterology;  Laterality: N/A;   BILIARY STENT PLACEMENT N/A 05/08/2022   Procedure: BILIARY STENT PLACEMENT;  Surgeon: Gatha Mayer, MD;  Location: [facility] ENDOSCOPY;  Service: Gastroenterology;  Laterality: N/A;   BIOPSY  10/29/2021   Procedure: BIOPSY;  Surgeon: Rush Landmark Telford Nab., MD;  Location: [facility] ENDOSCOPY;  Service: Gastroenterology;;   ENDOSCOPIC RETROGRADE CHOLANGIOPANCREATOGRAPHY (ERCP) WITH PROPOFOL N/A 10/29/2021   Procedure: ENDOSCOPIC RETROGRADE CHOLANGIOPANCREATOGRAPHY (ERCP) WITH PROPOFOL;  Surgeon: Irving Copas., MD;  Location: [facility] ENDOSCOPY;  Service: Gastroenterology;  Laterality: N/A;   ERCP N/A 09/10/2021   Procedure: ENDOSCOPIC RETROGRADE CHOLANGIOPANCREATOGRAPHY (ERCP);  Surgeon: Irene Shipper, MD;  Location: [facility];  Service: Gastroenterology;  Laterality: N/A;   ERCP N/A 10/03/2021   Procedure: ENDOSCOPIC RETROGRADE CHOLANGIOPANCREATOGRAPHY (ERCP);  Surgeon: Gatha Mayer, MD;  Location: [facility] ENDOSCOPY;  Service: Gastroenterology;  Laterality: N/A;   ERCP N/A 05/08/2022   Procedure: ENDOSCOPIC RETROGRADE  CHOLANGIOPANCREATOGRAPHY (ERCP);  Surgeon: Gatha Mayer, MD;  Location: [facility] ENDOSCOPY;  Service: Gastroenterology;  Laterality: N/A;   ESOPHAGOGASTRODUODENOSCOPY (EGD) WITH  PROPOFOL N/A 10/29/2021   Procedure: ESOPHAGOGASTRODUODENOSCOPY (EGD) WITH PROPOFOL;  Surgeon: Rush Landmark Telford Nab., MD;  Location: [facility] ENDOSCOPY;  Service: Gastroenterology;  Laterality: N/A;   EUS N/A 10/29/2021   Procedure: UPPER ENDOSCOPIC ULTRASOUND (EUS) RADIAL;  Surgeon: Irving Copas., MD;  Location: [facility] ENDOSCOPY;  Service: Gastroenterology;  Laterality: N/A;   FINE NEEDLE ASPIRATION  10/29/2021   Procedure: FINE NEEDLE ASPIRATION (FNA) LINEAR;  Surgeon: Irving Copas., MD;  Location: [facility] ENDOSCOPY;  Service: Gastroenterology;;   IR IMAGING GUIDED PORT INSERTION  01/28/2022   PACEMAKER IMPLANT     REMOVAL OF STONES  10/29/2021   Procedure: REMOVAL OF SLUDGE;  Surgeon: Irving Copas., MD;  Location: [facility];  Service: Gastroenterology;;   Joan Mayans  09/10/2021   Procedure: Joan Mayans;  Surgeon: Irene Shipper, MD;  Location[facility]COPY;  Service: Gastroenterology;;   Bess Kinds CHOLANGIOSCOPY N/A 10/29/2021   Procedure: XBJYNWGN CHOLANGIOSCOPY;  Surgeon: Irving Copas., MD;  Location: [facility] ENDOSCOPY;  Service: Gastroenterology;  Laterality: N/A;   STENT REMOVAL  10/03/2021   Procedure: STENT REMOVAL;  Surgeon: Gatha Mayer, MD;  Location: [facility];  Service: Gastroenterology;;    SOCIAL HISTORY:  Social History   Socioeconomic History   Marital status: Married    Spouse name: Not on file   Number of children: Not on file   Years of education: Not on file   Highest education level: Not on file  Occupational History   Not on file  Tobacco Use   Smoking status: Never   Smokeless tobacco: Never  Vaping Use   Vaping Use: Never used  Substance and Sexual Activity   Alcohol use: Never   Drug use: Never   Sexual activity: Not on file  Other Topics Concern   Not on file  Social History Narrative   Not on file   Social Determinants of Health   Financial Resource Strain: Not on file  Food Insecurity: No Food Insecurity  (05/07/2022)   Hunger Vital Sign    Worried About Running Out of Food in the Last Year: Never true    Ran Out of Food in the Last Year: Never true  Transportation Needs: No Transportation Needs (05/07/2022)   PRAPARE - Hydrologist (Medical): No    Lack of Transportation (Non-Medical): No  Physical Activity: Not on file  Stress: Not on file  Social Connections: Not on file  Intimate Partner Violence: Not At Risk (05/07/2022)   Humiliation, Afraid, Rape, and Kick questionnaire    Fear of Current or Ex-Partner: No    Emotionally Abused: No    Physically Abused: No    Sexually Abused: No    FAMILY HISTORY:  Family History  Problem Relation Age of Onset   Stomach cancer Neg Hx    Esophageal cancer Neg Hx    Colon cancer Neg Hx    Pancreatic cancer Neg Hx     CURRENT MEDICATIONS:  Current Outpatient Medications  Medication Sig Dispense Refill   amLODipine (NORVASC) 2.5 MG tablet Take 2.5 mg by mouth at bedtime.     aspirin EC 81 MG tablet Take 81 mg by mouth daily. Swallow whole.     Cholecalciferol (VITAMIN D3) 50 MCG (2000 UT) TABS Take 2,000 Units by mouth daily after supper.     Coenzyme Q10 (CO Q-10) 100 MG  CAPS Take 100 mg by mouth every evening.     famotidine (PEPCID) 40 MG tablet Take 40 mg by mouth every evening.     gabapentin (NEURONTIN) 100 MG capsule Take 100 mg by mouth 2 (two) times daily.     meclizine (ANTIVERT) 25 MG tablet Take 25 mg by mouth daily as needed for dizziness.     Misc Natural Products (JOINT SUPPORT) CAPS Take 1 capsule by mouth daily with breakfast.     Multiple Vitamin (MULTIVITAMIN) tablet Take 1 tablet by mouth daily with breakfast.     Multiple Vitamins-Minerals (OCUVITE EYE HEALTH FORMULA) CAPS Take 1 capsule by mouth daily.     nebivolol (BYSTOLIC) 5 MG tablet Take 2.5 mg by mouth in the morning.     omeprazole (PRILOSEC) 40 MG capsule Take 40 mg by mouth daily before breakfast.     polyethylene glycol (MIRALAX)  17 g packet Take 17 g by mouth daily as needed. (Patient taking differently: Take 17 g by mouth daily as needed for moderate constipation.) 14 each 0   Polyvinyl Alcohol-Povidone (REFRESH OP) Place 1 drop into both eyes daily as needed (tired eyes).     primidone (MYSOLINE) 50 MG tablet Take 25 mg by mouth daily as needed (for tremors).     pyridOXINE (VITAMIN B-6) 100 MG tablet Take 50 mg by mouth in the morning.     tiZANidine (ZANAFLEX) 2 MG tablet Take 1 mg by mouth daily as needed for muscle spasms.     lidocaine-prilocaine (EMLA) cream Apply a small amount to port a cath site (do not rub in) and cover with plastic wrap 1 hour prior to infusion appointments (Patient not taking: Reported on 06/03/2022) 30 g 3   prochlorperazine (COMPAZINE) 10 MG tablet Take 1 tablet (10 mg total) by mouth every 6 (six) hours as needed for nausea or vomiting. (Patient not taking: Reported on 06/03/2022) 60 tablet 6   No current facility-administered medications for this visit.   Facility-Administered Medications Ordered in Other Visits  Medication Dose Route Frequency Provider Last Rate Last Admin   sodium chloride flush (NS) 0.9 % injection 10 mL  10 mL Intracatheter PRN Derek Jack, MD   10 mL at 06/03/22 1426    ALLERGIES:  Allergies  Allergen Reactions   Ivp Dye [Iodinated Contrast Media] Hives   Lisinopril Cough    PHYSICAL EXAM:  Performance status (ECOG): 1 - Symptomatic but completely ambulatory  There were no vitals filed for this visit.  Wt Readings from Last 3 Encounters:  06/03/22 116 lb 6.4 oz (52.8 kg)  05/18/22 114 lb 3.2 oz (51.8 kg)  05/07/22 115 lb 4.8 oz (52.3 kg)   Physical Exam Vitals reviewed.  Constitutional:      Appearance: Normal appearance.  Cardiovascular:     Rate and Rhythm: Normal rate and regular rhythm.     Pulses: Normal pulses.     Heart sounds: Normal heart sounds.  Pulmonary:     Effort: Pulmonary effort is normal.     Breath sounds: Normal breath  sounds.  Neurological:     General: No focal deficit present.     Mental Status: She is alert and oriented to person, place, and time.  Psychiatric:        Mood and Affect: Mood normal.        Behavior: Behavior normal.      LABORATORY DATA:  I have reviewed the labs as listed.     Latest Ref Rng &  Units 06/03/2022    9:36 AM 05/18/2022   12:38 PM 05/09/2022    5:31 AM  CBC  WBC 4.0 - 10.5 K/uL 5.5  6.9  11.7   Hemoglobin 12.0 - 15.0 g/dL 9.6  9.3  9.9   Hematocrit 36.0 - 46.0 % 30.3  29.8  30.9   Platelets 150 - 400 K/uL 340  357  310       Latest Ref Rng & Units 06/03/2022    9:36 AM 05/18/2022   12:38 PM 05/09/2022    5:31 AM  CMP  Glucose 70 - 99 mg/dL 111  110  152   BUN 8 - 23 mg/dL _0 Creatinine 0.44 - 1.00 mg/dL 0.99  1.20  1.09   Sodium 135 - 145 mmol/L 135  136  136   Potassium 3.5 - 5.1 mmol/L 4.2  4.2  4.3   Chloride 98 - 111 mmol/L 104  103  103   CO2 22 - 32 mmol/L _1 Calcium 8.9 - 10.3 mg/dL 9.8  9.9  9.2   Total Protein 6.5 - 8.1 g/dL 6.5  6.5  5.9   Total Bilirubin 0.3 - 1.2 mg/dL 1.2  1.8  3.0   Alkaline Phos 38 - 126 U/L 155  214  213   AST 15 - 41 U/L 32  47  31   ALT 0 - 44 U/L 33  63  50     DIAGNOSTIC IMAGING:  I have independently reviewed the scans and discussed with the patient. DG ERCP  Result Date: 05/09/2022 CLINICAL DATA:  Pancreatic cancer with common bile duct stent in place EXAM: ERCP TECHNIQUE: Multiple spot images obtained with the fluoroscopic device and submitted for interpretation post-procedure. FLUOROSCOPY: Radiation Exposure Index (as provided by the fluoroscopic device): 40.53 mGy Kerma COMPARISON:  CT scan of the abdomen and pelvis 05/06/2022 FINDINGS: Eight intraoperative saved images are submitted for review. The images demonstrate a flexible duodenal scope in the descending duodenum followed by successful cannulation of the existing biliary stent. Subsequent images demonstrate cholangiography showing occlusion  of the biliary stent. Balloon sweeping is then performed followed by second stent placement within the existing stent. IMPRESSION: 1. The occlusion of the existing biliary stent by what appears to be tumoral in growth. 2. Successful placement of a second stent coaxially within the first stent. These images were submitted for radiologic interpretation only. Please see the procedural report for the amount of contrast and the fluoroscopy time utilized. Electronically Signed   By: Jacqulynn Cadet M.D.   On: 05/09/2022 06:51   DG Chest Port 1 View  Result Date: 05/07/2022 CLINICAL DATA:  Questionable sepsis - evaluate for abnormality. EXAM: PORTABLE CHEST 1 VIEW COMPARISON:  09/08/2021 FINDINGS: Right Port-A-Cath in place with the tip at the cavoatrial junction. Left pacer remains in place, unchanged. Heart and mediastinal contours are within normal limits. No focal opacities or effusions. No acute bony abnormality. Aortic atherosclerosis. IMPRESSION: No active cardiopulmonary disease. Electronically Signed   By: Rolm Baptise M.D.   On: 05/07/2022 02:06   CT ABDOMEN PELVIS WO CONTRAST  Result Date: 05/06/2022 CLINICAL DATA:  Pancreatic cancer with CBD stent. Abdominal pain and jaundice. * Tracking Code: BO * EXAM: CT ABDOMEN AND PELVIS WITHOUT CONTRAST TECHNIQUE: Multidetector CT imaging of the abdomen and pelvis was performed following the standard protocol without IV contrast. RADIATION DOSE REDUCTION: This exam was performed according to the departmental dose-optimization program  which includes automated exposure control, adjustment of the mA and/or kV according to patient size and/or use of iterative reconstruction technique. COMPARISON:  Abdominal ultrasound 05/06/2022; CT abdomen 05/08/2022 FINDINGS: Lower chest: Mild scarring in the right lower lobe and mild subsegmental atelectasis in the left lower lobe with trace bilateral pleural effusions. Pacer leads noted. Mild mitral valve calcification.  Descending thoracic aortic atherosclerotic vascular calcification. Hepatobiliary: Pneumobilia noted favoring patency of the stent although components of the intrahepatic biliary tree are slightly more gas distended than they were on 01/16/2022. Portions of the stent or filled with intermediate density which is probably fluid but technically nonspecific. Portions the right hepatic lobe have mild intrahepatic biliary dilatation. Contracted gallbladder. Pancreas: The pancreatic head mass is poorly characterized on today's noncontrast examination. No substantial dorsal pancreatic duct dilatation. Spleen: Unremarkable Adrenals/Urinary Tract: Stable benign cyst exophytic from the right kidney lower pole. No further imaging workup of this lesion is indicated. Three punctate nonobstructive left renal calculi are present. No hydronephrosis or hydroureter. Urinary bladder unremarkable. Adrenal glands unremarkable. Stomach/Bowel: Unremarkable Vascular/Lymphatic: Atherosclerosis is present, including aortoiliac atherosclerotic disease. Reproductive: Unremarkable Other: Subtle edema in the perirenal spaces and mesentery as well as the subcutaneous tissues potentially from third spacing of fluid. Musculoskeletal: Lumbar spondylosis and degenerative disc disease causing mild multilevel impingement. IMPRESSION: 1. There is some very mild intrahepatic biliary dilatation along with pneumobilia. In light of the increasing serum bilirubin levels, this could reflect very early or partial stent malfunction/occlusion. Portions of the expandable CBD stent are filled with fluid or soft tissue density. 2. The pancreatic tumor itself is poorly characterized on today's noncontrast CT exam. 3. Likely mild third say pacing of fluid with low-grade mesenteric, retroperitoneal, and subcutaneous edema along with trace bilateral pleural effusions. 4. Nonobstructive left nephrolithiasis. 5. Aortic Atherosclerosis (ICD10-I70.0). Mild mitral valve  calcification. 6. Mild multilevel lumbar impingement. Electronically Signed   By: Van Clines M.D.   On: 05/06/2022 20:12   US Abdomen Limited RUQ (LIVER/GB)  Result Date: 05/06/2022 CLINICAL DATA:  Jaundice, history of common bile duct stent, pancreatic cancer EXAM: ULTRASOUND ABDOMEN LIMITED RIGHT UPPER QUADRANT COMPARISON:  01/16/2022 FINDINGS: Gallbladder: Gallbladder is moderately distended, with no evidence of cholelithiasis. Minimal echogenic sludge is identified. Borderline gallbladder wall thickening measuring 3 mm. Negative sonographic Murphy sign. No free fluid surrounding the gallbladder. Common bile duct: Diameter: Common bile duct stent is identified. The common bile duct measures up to 8 mm in diameter. Liver: Liver demonstrates normal echotexture. No focal lesion is identified. There is mild intrahepatic biliary duct dilation, not appreciably changed from prior CT. Ring down artifact consistent with pneumobilia, consistent with at least partial patency of the common bile duct stent. Portal vein is patent on color Doppler imaging with normal direction of blood flow towards the liver. Other: Stable exophytic simple 6 cm right renal cyst does not require follow-up. IMPRESSION: 1. Common bile duct stent as above. There is mild persistent intrahepatic biliary duct dilation, with evidence of pneumobilia suggesting at least partial patency of the biliary stent. If further evaluation of the stent is desired, CT could be performed. 2. Gallbladder sludge, with no evidence of cholelithiasis or acute cholecystitis. Electronically Signed   By: Randa Ngo M.D.   On: 05/06/2022 19:27     ASSESSMENT:  T2 N0 M1 pancreatic adenocarcinoma: - She presented with painless jaundice in April 2023.  Weight loss of 13 pounds since April. - Stent placement on 09/10/2021 for a bilirubin of 8.2. - 10/03/2021: ERCP and stent  removal and stent placement for stent dysfunction with total bilirubin 16.3. -  10/29/2021: ERCP/EUS: Lower CBD stent stenosis from stricture, status post metal stent placement.  EUS showed mass in the superior pancreatic head measuring 2.8 x 2.2 cm, suggesting abutment of the portal vein.  Intact interface was seen between lesion and SMA and celiac trunk suggesting lack of invasion.  Hypoechoic 14 x 12 mm lesion visualized in the liver.  Biopsy could not be attempted due to presence of blood vessel. - She had syncopal episodes in 11-Jul-2022, had pacemaker placement for bradycardia. - Pathology: Pancreatic head FNA and biliary stricture brushing: Malignant cells consistent with adenocarcinoma. - CA 19-9: 131 - We reviewed MRI of the abdomen with and without contrast (12/24/2021): Pancreatic head mass measures 2.7 x 2.3 cm (2.5 x 1.8 cm previously).  Mass is abutting and slightly displacing the SMA.  No obvious fat plane is demonstrated.  Portal and splenic veins are patent.  Stable small scattered hepatic cysts with no liver metastatic disease. - PET scan (11/20/2021): Ill-defined low-level hypermetabolic activity about the metallic CBD stent and pancreatic uncinate process without discrete CT correlate.  No convincing evidence of hypermetabolic disease in the neck, chest, abdomen or pelvis.  Tiny bilateral lung nodules measuring up to 3 mm.  -CT pancreatic protocol (01/16/2022): Hypoenhancing mass in the pancreatic head measuring 2.9 x 1.8 cm.  Slightly greater than 180 degrees of abutment with portal vein as well as SMA without definite vascular encasement.  New enhancing 9 mm soft tissue nodule along the posterior aspect of the gallbladder wall?  Suspicious for peritoneal implant. -I have discussed with Dr. Zenia Resides who has seen this patient. -Dr. Zenia Resides thought that this patient might not be a candidate for surgery but she would like to evaluate after 3 to 4 months of neoadjuvant chemotherapy. -We talked about neoadjuvant chemotherapy.  She is not a candidate for FOLFIRINOX.  I have  recommended gemcitabine and Abraxane every other week.  Cycle 1 was started on 02/05/2022.  Social/family history: - She lives at home with her husband who has dementia.  She worked as a Char[employer] fundraiser for 33 years.  Non-smoker and no exposure to chemicals.  She has not been driving since 07-11-22. - Daughter died of glioblastoma.  Mother had ovarian cancer.  Maternal aunt had breast cancer.   PLAN:  T2 N0 M0 pancreatic adenocarcinoma: - She has tolerated cycle 3-day 1 chemotherapy 2 weeks ago very well. - Reviewed labs today which showed total bilirubin normalized at 1.2.  Alk phos is 155.  Creatinine is also better at 0.9.  CBC was grossly normal. - Recommend proceeding with C3 day 15 today.  RTC 2 weeks for next cycle.  Plan to repeat scans after cycle 4.  2.  Weight loss: - She gained 2.4 pounds since last 2 weeks. - She is eating 3 meals per day and drinking 1 can of Ensure high-protein per day.    Orders placed this encounter:  No orders of the defined types were placed in this encounter.     Derek Jack, MD [facility] [phone]

## 2022-06-03 NOTE — Progress Notes (Signed)
Patient has been examined by Dr. Katragadda, and vital signs and labs have been reviewed. ANC, Creatinine, LFTs, hemoglobin, and platelets are within treatment parameters per M.D. - pt may proceed with treatment.  Primary RN and pharmacy notified.  

## 2022-06-03 NOTE — Patient Instructions (Signed)
[facility] Discharge Instructions   You were seen and examined today by Dr. Katragadda.  He reviewed the results of your lab work which are normal/stable.   We will proceed with your treatment today.  Return as scheduled.    Thank you for choosing [name] to provide your oncology and hematology care.  To afford each patient quality time with our provider, please arrive at least 15 minutes before your scheduled appointment time.   If you have a lab appointment with the Cancer Center please come in thru the Main Entrance and check in at the main information desk.  You need to re-schedule your appointment should you arrive 10 or more minutes late.  We strive to give you quality time with our providers, and arriving late affects you and other patients whose appointments are after yours.  Also, if you no show three or more times for appointments you may be dismissed from the clinic at the providers discretion.     Again, thank you for choosing [facility].  Our hope is that these requests will decrease the amount of time that you wait before being seen by our physicians.       _____________________________________________________________  Should you have questions after your visit to [facility], please contact our office at [phone] and follow the prompts.  Our office hours are 8:00 a.m. and 4:30 p.m. Monday - Friday.  Please note that voicemails left after 4:00 p.m. may not be returned until the following business day.  We are closed weekends and major holidays.  You do have access to a nurse 24-7, just call the main number to the clinic [phone] and do not press any options, hold on the line and a nurse will answer the phone.    For prescription refill requests, have your pharmacy contact our office and allow 72 hours.    Due to Covid, you will need to wear a mask upon entering  the hospital. If you do not have a mask, a mask will be given to you at the Main Entrance upon arrival. For doctor visits, patients may have 1 support person age 18 or older with them. For treatment visits, patients can not have anyone with them due to social distancing guidelines and our immunocompromised population.      

## 2022-06-04 ENCOUNTER — Other Ambulatory Visit: Payer: Self-pay

## 2022-06-04 LAB — CANCER ANTIGEN 19-9: CA 19-9: 271 U/mL — ABNORMAL HIGH (ref 0–35)

## 2022-06-07 ENCOUNTER — Other Ambulatory Visit: Payer: Self-pay

## 2022-06-09 ENCOUNTER — Other Ambulatory Visit: Payer: Self-pay

## 2022-06-13 ENCOUNTER — Other Ambulatory Visit: Payer: Self-pay

## 2022-06-18 ENCOUNTER — Inpatient Hospital Stay: Payer: Medicare HMO

## 2022-06-18 ENCOUNTER — Encounter: Payer: Self-pay | Admitting: Hematology

## 2022-06-18 ENCOUNTER — Inpatient Hospital Stay (HOSPITAL_BASED_OUTPATIENT_CLINIC_OR_DEPARTMENT_OTHER): Payer: Medicare HMO | Admitting: Hematology

## 2022-06-18 VITALS — BP 152/72 | HR 80 | Temp 98.0°F | Resp 18

## 2022-06-18 DIAGNOSIS — Z95828 Presence of other vascular implants and grafts: Secondary | ICD-10-CM

## 2022-06-18 DIAGNOSIS — C259 Malignant neoplasm of pancreas, unspecified: Secondary | ICD-10-CM

## 2022-06-18 DIAGNOSIS — Z5111 Encounter for antineoplastic chemotherapy: Secondary | ICD-10-CM | POA: Diagnosis not present

## 2022-06-18 DIAGNOSIS — D649 Anemia, unspecified: Secondary | ICD-10-CM

## 2022-06-18 LAB — COMPREHENSIVE METABOLIC PANEL
ALT: 41 U/L (ref 0–44)
AST: 36 U/L (ref 15–41)
Albumin: 3.5 g/dL (ref 3.5–5.0)
Alkaline Phosphatase: 192 U/L — ABNORMAL HIGH (ref 38–126)
Anion gap: 10 (ref 5–15)
BUN: 32 mg/dL — ABNORMAL HIGH (ref 8–23)
CO2: 23 mmol/L (ref 22–32)
Calcium: 9.7 mg/dL (ref 8.9–10.3)
Chloride: 101 mmol/L (ref 98–111)
Creatinine, Ser: 1 mg/dL (ref 0.44–1.00)
GFR, Estimated: 55 mL/min — ABNORMAL LOW (ref >60)
Glucose, Bld: 107 mg/dL — ABNORMAL HIGH (ref 70–99)
Potassium: 4.7 mmol/L (ref 3.5–5.1)
Sodium: 134 mmol/L — ABNORMAL LOW (ref 135–145)
Total Bilirubin: 0.8 mg/dL (ref 0.3–1.2)
Total Protein: 6.9 g/dL (ref 6.5–8.1)

## 2022-06-18 LAB — MAGNESIUM: Magnesium: 2.2 mg/dL (ref 1.7–2.4)

## 2022-06-18 LAB — IRON AND TIBC
Iron: 55 ug/dL (ref 28–170)
Saturation Ratios: 13 % (ref 10.4–31.8)
TIBC: 438 ug/dL (ref 250–450)
UIBC: 383 ug/dL

## 2022-06-18 LAB — CBC WITH DIFFERENTIAL/PLATELET
Abs Immature Granulocytes: 0.02 10*3/uL (ref 0.00–0.07)
Basophils Absolute: 0.1 10*3/uL (ref 0.0–0.1)
Basophils Relative: 1 %
Eosinophils Absolute: 0.2 10*3/uL (ref 0.0–0.5)
Eosinophils Relative: 3 %
HCT: 31.3 % — ABNORMAL LOW (ref 36.0–46.0)
Hemoglobin: 9.9 g/dL — ABNORMAL LOW (ref 12.0–15.0)
Immature Granulocytes: 0 %
Lymphocytes Relative: 19 %
Lymphs Abs: 1.3 10*3/uL (ref 0.7–4.0)
MCH: 28.9 pg (ref 26.0–34.0)
MCHC: 31.6 g/dL (ref 30.0–36.0)
MCV: 91.3 fL (ref 80.0–100.0)
Monocytes Absolute: 0.8 10*3/uL (ref 0.1–1.0)
Monocytes Relative: 11 %
Neutro Abs: 4.4 10*3/uL (ref 1.7–7.7)
Neutrophils Relative %: 66 %
Platelets: 267 10*3/uL (ref 150–400)
RBC: 3.43 MIL/uL — ABNORMAL LOW (ref 3.87–5.11)
RDW: 16.2 % — ABNORMAL HIGH (ref 11.5–15.5)
WBC: 6.7 10*3/uL (ref 4.0–10.5)
nRBC: 0 % (ref 0.0–0.2)

## 2022-06-18 LAB — FERRITIN: Ferritin: 32 ng/mL (ref 11–307)

## 2022-06-18 MED ORDER — SODIUM CHLORIDE 0.9 % IV SOLN
10.0000 mg | Freq: Once | INTRAVENOUS | Status: AC
  Administered 2022-06-18: 10 mg via INTRAVENOUS
  Filled 2022-06-18: qty 10

## 2022-06-18 MED ORDER — SODIUM CHLORIDE 0.9 % IV SOLN: Freq: Once | INTRAVENOUS | Status: AC

## 2022-06-18 MED ORDER — HEPARIN SOD (PORK) LOCK FLUSH 100 UNIT/ML IV SOLN
500.0000 [IU] | Freq: Once | INTRAVENOUS | Status: AC | PRN
  Administered 2022-06-18: 500 [IU]

## 2022-06-18 MED ORDER — PALONOSETRON HCL INJECTION 0.25 MG/5ML
0.2500 mg | Freq: Once | INTRAVENOUS | Status: AC
  Administered 2022-06-18: 0.25 mg via INTRAVENOUS
  Filled 2022-06-18: qty 5

## 2022-06-18 MED ORDER — SODIUM CHLORIDE 0.9 % IV SOLN
500.0000 mg/m2 | Freq: Once | INTRAVENOUS | Status: AC
  Administered 2022-06-18: 760 mg via INTRAVENOUS
  Filled 2022-06-18: qty 19.99

## 2022-06-18 MED ORDER — PACLITAXEL PROTEIN-BOUND CHEMO INJECTION 100 MG
100.0000 mg/m2 | Freq: Once | INTRAVENOUS | Status: AC
  Administered 2022-06-18: 150 mg via INTRAVENOUS
  Filled 2022-06-18: qty 30

## 2022-06-18 MED ORDER — PREDNISONE 50 MG PO TABS: ORAL_TABLET | ORAL | 0 refills | Status: DC

## 2022-06-18 MED ORDER — SODIUM CHLORIDE 0.9% FLUSH
10.0000 mL | INTRAVENOUS | Status: DC | PRN
  Administered 2022-06-18: 10 mL via INTRAVENOUS

## 2022-06-18 MED ORDER — DIPHENHYDRAMINE HCL 50 MG PO TABS: 50.0000 mg | ORAL_TABLET | Freq: Once | ORAL | 0 refills | Status: DC

## 2022-06-18 MED ORDER — SODIUM CHLORIDE 0.9% FLUSH
10.0000 mL | INTRAVENOUS | Status: DC | PRN
  Administered 2022-06-18: 10 mL

## 2022-06-18 NOTE — Progress Notes (Signed)
Patient presents today for chemotherapy infusion.  Patient is in satisfactory condition with no complaints voiced.  Vital signs are stable.  Labs reviewed by Dr. Katragadda during her office visit.  All labs are within treatment parameters.  We will proceed with treatment per MD orders.    Patient tolerated treatment well with no complaints voiced.  Patient left ambulatory in stable condition.  Vital signs stable at discharge.  Follow up as scheduled.    

## 2022-06-18 NOTE — Progress Notes (Signed)
[address] [place], [place] [address]   CLINIC:  Medical Oncology/Hematology  PCP:  Abigail Miyamoto, [place] / [place] [id number] [phone]   REASON FOR VISIT:  Follow-up for pancreatic adenocarcinoma  PRIOR THERAPY: none  NGS Results: not done  CURRENT THERAPY: Gemcitabine and Abraxane  BRIEF ONCOLOGIC HISTORY:  Oncology History  Pancreatic carcinoma ([place])  11/11/2021 Initial Diagnosis   Pancreatic carcinoma ([place])   02/05/2022 -  Chemotherapy   Patient is on Treatment Plan : PANCREATIC Abraxane D1,15 + Gemcitabine D1,15 q28d       CANCER STAGING:  Cancer Staging  Pancreatic carcinoma ([place]) Staging form: Exocrine Pancreas, AJCC 8th Edition - Clinical stage from 11/11/2021: Stage IB (cT2, cN0, cM0) - Unsigned   INTERVAL HISTORY:  [name], a 88 y.o. female, seen for follow-up of pancreatic cancer.  She is here for next cycle of chemotherapy and toxicity assessment.  After last treatment 2 weeks ago, she did not have any major side effects.  She is continuing to eat well.  She lost about 2 pounds.  She had weak spells x 2 after last treatment.  No abdominal pains reported.  REVIEW OF SYSTEMS:  Review of Systems  Constitutional:  Negative for appetite change, fatigue and unexpected weight change.  Gastrointestinal:  Negative for abdominal pain.  All other systems reviewed and are negative.   PAST MEDICAL/SURGICAL HISTORY:  Past Medical History:  Diagnosis Date   Essential tremor    GERD (gastroesophageal reflux disease)    High cholesterol    Hypertension    PONV (postoperative nausea and vomiting)    Port-A-Cath in place 01/27/2022   Vertigo    Past Surgical History:  Procedure Laterality Date   BILIARY BRUSHING  09/10/2021   Procedure: BILIARY BRUSHING;  Surgeon: Irene Shipper, MD;  Location: [facility] -  ENDOSCOPY;  Service: Gastroenterology;;   BILIARY BRUSHING  10/29/2021   Procedure: BILIARY  BRUSHING;  Surgeon: Irving Copas., MD;  Location: [facility] ENDOSCOPY;  Service: Gastroenterology;;   BILIARY DILATION  10/29/2021   Procedure: BILIARY DILATION;  Surgeon: Irving Copas., MD;  Location: [facility] ENDOSCOPY;  Service: Gastroenterology;;   BILIARY STENT PLACEMENT  09/10/2021   Procedure: BILIARY STENT PLACEMENT;  Surgeon: Irene Shipper, MD;  Location: [facility] ENDOSCOPY;  Service: Gastroenterology;;   BILIARY STENT PLACEMENT N/A 10/03/2021   Procedure: BILIARY STENT PLACEMENT;  Surgeon: Gatha Mayer, MD;  Location: [facility] ENDOSCOPY;  Service: Gastroenterology;  Laterality: N/A;   BILIARY STENT PLACEMENT N/A 10/29/2021   Procedure: BILIARY STENT PLACEMENT;  Surgeon: Rush Landmark Telford Nab., MD;  Location: [facility] ENDOSCOPY;  Service: Gastroenterology;  Laterality: N/A;   BILIARY STENT PLACEMENT N/A 05/08/2022   Procedure: BILIARY STENT PLACEMENT;  Surgeon: Gatha Mayer, MD;  Location: [facility] ENDOSCOPY;  Service: Gastroenterology;  Laterality: N/A;   BIOPSY  10/29/2021   Procedure: BIOPSY;  Surgeon: Rush Landmark Telford Nab., MD;  Location: [facility] ENDOSCOPY;  Service: Gastroenterology;;   ENDOSCOPIC RETROGRADE CHOLANGIOPANCREATOGRAPHY (ERCP) WITH PROPOFOL N/A 10/29/2021   Procedure: ENDOSCOPIC RETROGRADE CHOLANGIOPANCREATOGRAPHY (ERCP) WITH PROPOFOL;  Surgeon: Irving Copas., MD;  Location: [facility] ENDOSCOPY;  Service: Gastroenterology;  Laterality: N/A;   ERCP N/A 09/10/2021   Procedure: ENDOSCOPIC RETROGRADE CHOLANGIOPANCREATOGRAPHY (ERCP);  Surgeon: Irene Shipper, MD;  Location: [facility];  Service: Gastroenterology;  Laterality: N/A;   ERCP N/A 10/03/2021   Procedure: ENDOSCOPIC RETROGRADE CHOLANGIOPANCREATOGRAPHY (ERCP);  Surgeon: Gatha Mayer, MD;  Location: [facility] ENDOSCOPY;  Service: Gastroenterology;  Laterality: N/A;  ERCP N/A 05/08/2022   Procedure: ENDOSCOPIC RETROGRADE CHOLANGIOPANCREATOGRAPHY (ERCP);  Surgeon: Gatha Mayer, MD;  Location: [facility] ENDOSCOPY;  Service: Gastroenterology;   Laterality: N/A;   ESOPHAGOGASTRODUODENOSCOPY (EGD) WITH PROPOFOL N/A 10/29/2021   Procedure: ESOPHAGOGASTRODUODENOSCOPY (EGD) WITH PROPOFOL;  Surgeon: Rush Landmark Telford Nab., MD;  Location: [facility] ENDOSCOPY;  Service: Gastroenterology;  Laterality: N/A;   EUS N/A 10/29/2021   Procedure: UPPER ENDOSCOPIC ULTRASOUND (EUS) RADIAL;  Surgeon: Irving Copas., MD;  Location: [facility] ENDOSCOPY;  Service: Gastroenterology;  Laterality: N/A;   FINE NEEDLE ASPIRATION  10/29/2021   Procedure: FINE NEEDLE ASPIRATION (FNA) LINEAR;  Surgeon: Irving Copas., MD;  Location: [facility] ENDOSCOPY;  Service: Gastroenterology;;   IR IMAGING GUIDED PORT INSERTION  01/28/2022   PACEMAKER IMPLANT     REMOVAL OF STONES  10/29/2021   Procedure: REMOVAL OF SLUDGE;  Surgeon: Irving Copas., MD;  Location: [facility] ENDOS[facility];  Service: Gastroenterology;;   Joan Mayans  09/10/2021   Procedure: Joan Mayans;  Surgeon: Irene Shipper, MD;  Location: [facility] ENDOSCOPY;  Service: Gastroenterology;;   Bess Kinds CHOLANGIOSCOPY N/A 10/29/2021   Procedure: [id number] CHOLANGIOSCOPY;  Surgeon: Irving Copas., MD;  Location: [facility] ENDOSCOPY;  Service: Gastroenterology;  Laterality: N/A;   STENT REMOVAL  10/03/2021   Procedure: STENT REMOVAL;  Surgeon: Gatha Mayer, MD;  Location: [facility] ENDOSCOPY;  Service: Gastroenterology;;    SOCIAL HISTORY:  Social History   Socioeconomic History   Marital status: Married    Spouse name: Not on file   Number of children: Not on file   Years of education: Not on file   Highest education level: Not on file  Occupational History   Not on file  Tobacco Use   Smoking status: Never   Smokeless tobacco: Never  Vaping Use   Vaping Use: Never used  Substance and Sexual Activity   Alcohol use: Never   Drug use: Never   Sexual activity: Not on file  Other Topics Concern   Not on file  Social History Narrative   Not on file   Social Determinants of Health   Financial Resource Strain:  Not on file  Food Insecurity: No Food Insecurity (05/07/2022)   Hunger Vital Sign    Worried About Running Out of Food in the Last Year: Never true    Ran Out of Food in the Last Year: Never true  Transportation Needs: No Transportation Needs (05/07/2022)   PRAPARE - Hydrologist (Medical): No    Lack of Transportation (Non-Medical): No  Physical Activity: Not on file  Stress: Not on file  Social Connections: Not on file  Intimate Partner Violence: Not At Risk (05/07/2022)   Humiliation, Afraid, Rape, and Kick questionnaire    Fear of Current or Ex-Partner: No    Emotionally Abused: No    Physically Abused: No    Sexually Abused: No    FAMILY HISTORY:  Family History  Problem Relation Age of Onset   Stomach cancer Neg Hx    Esophageal cancer Neg Hx    Colon cancer Neg Hx    Pancreatic cancer Neg Hx     CURRENT MEDICATIONS:  Current Outpatient Medications  Medication Sig Dispense Refill   amLODipine (NORVASC) 2.5 MG tablet Take 2.5 mg by mouth at bedtime.     aspirin EC 81 MG tablet Take 81 mg by mouth daily. Swallow whole.     Cholecalciferol (VITAMIN D3) 50 MCG (2000 UT) TABS Take 2,000 Units by mouth daily after supper.  Coenzyme Q10 (CO Q-10) 100 MG CAPS Take 100 mg by mouth every evening.     famotidine (PEPCID) 40 MG tablet Take 40 mg by mouth every evening.     gabapentin (NEURONTIN) 100 MG capsule Take 100 mg by mouth 2 (two) times daily.     lidocaine-prilocaine (EMLA) cream Apply a small amount to port a cath site (do not rub in) and cover with plastic wrap 1 hour prior to infusion appointments 30 g 3   meclizine (ANTIVERT) 25 MG tablet Take 25 mg by mouth daily as needed for dizziness.     Misc Natural Products (JOINT SUPPORT) CAPS Take 1 capsule by mouth daily with breakfast.     Multiple Vitamin (MULTIVITAMIN) tablet Take 1 tablet by mouth daily with breakfast.     Multiple Vitamins-Minerals (OCUVITE EYE HEALTH FORMULA) CAPS Take 1  capsule by mouth daily.     nebivolol (BYSTOLIC) 5 MG tablet Take 2.5 mg by mouth in the morning.     omeprazole (PRILOSEC) 40 MG capsule Take 40 mg by mouth daily before breakfast.     polyethylene glycol (MIRALAX) 17 g packet Take 17 g by mouth daily as needed. (Patient taking differently: Take 17 g by mouth daily as needed for moderate constipation.) 14 each 0   Polyvinyl Alcohol-Povidone (REFRESH OP) Place 1 drop into both eyes daily as needed (tired eyes).     primidone (MYSOLINE) 50 MG tablet Take 25 mg by mouth daily as needed (for tremors).     prochlorperazine (COMPAZINE) 10 MG tablet Take 1 tablet (10 mg total) by mouth every 6 (six) hours as needed for nausea or vomiting. 60 tablet 6   pyridOXINE (VITAMIN B-6) 100 MG tablet Take 50 mg by mouth in the morning.     tiZANidine (ZANAFLEX) 2 MG tablet Take 1 mg by mouth daily as needed for muscle spasms.     No current facility-administered medications for this visit.    ALLERGIES:  Allergies  Allergen Reactions   Ivp Dye [Iodinated Contrast Media] Hives   Lisinopril Cough    PHYSICAL EXAM:  Performance status (ECOG): 1 - Symptomatic but completely ambulatory  There were no vitals filed for this visit.  Wt Readings from Last 3 Encounters:  06/18/22 114 lb 11.2 oz (52 kg)  06/03/22 116 lb 6.4 oz (52.8 kg)  05/18/22 114 lb 3.2 oz (51.8 kg)   Physical Exam Vitals reviewed.  Constitutional:      Appearance: Normal appearance.  Cardiovascular:     Rate and Rhythm: Normal rate and regular rhythm.     Pulses: Normal pulses.     Heart sounds: Normal heart sounds.  Pulmonary:     Effort: Pulmonary effort is normal.     Breath sounds: Normal breath sounds.  Neurological:     General: No focal deficit present.     Mental Status: She is alert and oriented to person, place, and time.  Psychiatric:        Mood and Affect: Mood normal.        Behavior: Behavior normal.     LABORATORY DATA:  I have reviewed the labs as listed.      Latest Ref Rng & Units 06/18/2022   11:55 AM 06/03/2022    9:36 AM 05/18/2022   12:38 PM  CBC  WBC 4.0 - 10.5 K/uL 6.7  5.5  6.9   Hemoglobin 12.0 - 15.0 g/dL 9.9  9.6  9.3   Hematocrit 36.0 - 46.0 % 31.3  30.3  29.8   Platelets 150 - 400 K/uL 267  340  357       Latest Ref Rng & Units 06/18/2022   11:55 AM 06/03/2022    9:36 AM 05/18/2022   12:38 PM  CMP  Glucose 70 - 99 mg/dL 107  111  110   BUN 8 - 23 mg/dL 32  31  28   Creatinine 0.44 - 1.00 mg/dL 1.00  0.99  1.20   Sodium 135 - 145 mmol/L 134  135  136   Potassium 3.5 - 5.1 mmol/L 4.7  4.2  4.2   Chloride 98 - 111 mmol/L 101  104  103   CO2 22 - 32 mmol/L '23  24  26   '$ Calcium 8.9 - 10.3 mg/dL 9.7  9.8  9.9   Total Protein 6.5 - 8.1 g/dL 6.9  6.5  6.5   Total Bilirubin 0.3 - 1.2 mg/dL 0.8  1.2  1.8   Alkaline Phos 38 - 126 U/L 192  155  214   AST 15 - 41 U/L 36  32  47   ALT 0 - 44 U/L 41  33  63     DIAGNOSTIC IMAGING:  I have independently reviewed the scans and discussed with the patient. No results found.   ASSESSMENT:  T2 N0 M1 pancreatic adenocarcinoma: - She presented with painless jaundice in April 2023.  Weight loss of 13 pounds since April. - Stent placement on 09/10/2021 for a bilirubin of 8.2. - 10/03/2021: ERCP and stent removal and stent placement for stent dysfunction with total bilirubin 16.3. - 10/29/2021: ERCP/EUS: Lower CBD stent stenosis from stricture, status post metal stent placement.  EUS showed mass in the superior pancreatic head measuring 2.8 x 2.2 cm, suggesting abutment of the portal vein.  Intact interface was seen between lesion and SMA and celiac trunk suggesting lack of invasion.  Hypoechoic 14 x 12 mm lesion visualized in the liver.  Biopsy could not be attempted due to presence of blood vessel. - She had syncopal episodes in January, had pacemaker placement for bradycardia. - Pathology: Pancreatic head FNA and biliary stricture brushing: Malignant cells consistent with adenocarcinoma. - CA  19-9: 131 - We reviewed MRI of the abdomen with and without contrast (12/24/2021): Pancreatic head mass measures 2.7 x 2.3 cm (2.5 x 1.8 cm previously).  Mass is abutting and slightly displacing the SMA.  No obvious fat plane is demonstrated.  Portal and splenic veins are patent.  Stable small scattered hepatic cysts with no liver metastatic disease. - PET scan (11/20/2021): Ill-defined low-level hypermetabolic activity about the metallic CBD stent and pancreatic uncinate process without discrete CT correlate.  No convincing evidence of hypermetabolic disease in the neck, chest, abdomen or pelvis.  Tiny bilateral lung nodules measuring up to 3 mm.  -CT pancreatic protocol (01/16/2022): Hypoenhancing mass in the pancreatic head measuring 2.9 x 1.8 cm.  Slightly greater than 180 degrees of abutment with portal vein as well as SMA without definite vascular encasement.  New enhancing 9 mm soft tissue nodule along the posterior aspect of the gallbladder wall?  Suspicious for peritoneal implant. -I have discussed with Dr. Zenia Resides who has seen this patient. -Dr. Zenia Resides thought that this patient might not be a candidate for surgery but she would like to evaluate after 3 to 4 months of neoadjuvant chemotherapy. -We talked about neoadjuvant chemotherapy.  She is not a candidate for FOLFIRINOX.  I have recommended gemcitabine and Abraxane every other week.  Cycle 1 was started on 02/05/2022.  Social/family history: - She lives at home with her husband who has dementia.  She worked as a Char[employer] fundraiser for 33 years.  Non-smoker and no exposure to chemicals.  She has not been driving since 07-10-2022. - Daughter died of glioblastoma.  Mother had ovarian cancer.  Maternal aunt had breast cancer.   PLAN:  T2 N0 M0 pancreatic adenocarcinoma: - She has tolerated last treatment cycle very well. - Reviewed labs today which showed elevated alk phos and rest of LFTs normal.  CBC was grossly normal.  Ferritin is 32 and percent  saturation of 13.  Hemoglobin is 9.9.  CA 19-9 is 271. - She will proceed with cycle 4-day 1 today.  RTC 2 weeks for follow-up. - Will arrange for CT pancreatic protocol and CT chest in 3 to 4 weeks.  2.  Weight loss: - She lost 2 pounds but her weight is back to baseline. - She is eating 3 meals per day and drinking 1 can of Ensure high-protein per day.  3.  Normocytic anemia: - Hemoglobin 9.9 with ferritin of 32 and percent saturation of 13. - Recommend parenteral iron therapy with Feraheme at next visit.    Orders placed this encounter:  No orders of the defined types were placed in this encounter.     Derek Jack, MD [place] [phone]

## 2022-06-18 NOTE — Patient Instructions (Signed)
[facility]  Discharge Instructions  You were seen and examined today by Dr. Delton Coombes.  Proceed with treatment today as planned.  Follow-up as scheduled.  Thank you for choosing [facility] to provide your oncology and hematology care.   To afford each patient quality time with our provider, please arrive at least 15 minutes before your scheduled appointment time. You may need to reschedule your appointment if you arrive late (10 or more minutes). Arriving late affects you and other patients whose appointments are after yours.  Also, if you miss three or more appointments without notifying the office, you may be dismissed from the clinic at the provider's discretion.    Again, thank you for choosing [facility].  Our hope is that these requests will decrease the amount of time that you wait before being seen by our physicians.   If you have a lab appointment with the [facility] please come in thru the Main Entrance and check in at the main information desk.           _____________________________________________________________  Should you have questions after your visit to [facility], please contact our office at [phone] and follow the prompts.  Our office hours are 8:00 a.m. to 4:30 p.m. Monday - Thursday and 8:00 a.m. to 2:30 p.m. Friday.  Please note that voicemails left after 4:00 p.m. may not be returned until the following business day.  We are closed weekends and all major holidays.  You do have access to a nurse 24-7, just call the main number to the clinic [phone] and do not press any options, hold on the line and a nurse will answer the phone.    For prescription refill requests, have your pharmacy contact our office and allow 72 hours.    Masks are optional in the cancer centers. If you would like for your care team to wear a mask while they are taking care of you, please let them  know. You may have one support person who is at least 86 years old accompany you for your appointments.

## 2022-06-18 NOTE — Patient Instructions (Signed)
[facility]  Discharge Instructions: Thank you for choosing [facility] to provide your oncology and hematology care.  If you have a lab appointment with the [facility], please come in thru the Main Entrance and check in at the main information desk.  Wear comfortable clothing and clothing appropriate for easy access to any Portacath or PICC line.   We strive to give you quality time with your provider. You may need to reschedule your appointment if you arrive late (15 or more minutes).  Arriving late affects you and other patients whose appointments are after yours.  Also, if you miss three or more appointments without notifying the office, you may be dismissed from the clinic at the provider's discretion.      For prescription refill requests, have your pharmacy contact our office and allow 72 hours for refills to be completed.    Today you received the following chemotherapy and/or immunotherapy agents Abraxane/Gemzar.  Paclitaxel Nanoparticle Albumin-Bound Injection What is this medication? NANOPARTICLE ALBUMIN-BOUND PACLITAXEL (Na no PAHR ti kuhl al BYOO muhn-bound PAK li TAX el) treats some types of cancer. It works by slowing down the growth of cancer cells. This medicine may be used for other purposes; ask your health care provider or pharmacist if you have questions. COMMON BRAND NAME(S): Abraxane What should I tell my care team before I take this medication? They need to know if you have any of these conditions: Liver disease Low white blood cell levels An unusual or allergic reaction to paclitaxel, albumin, other medications, foods, dyes, or preservatives If you or your partner are pregnant or trying to get pregnant Breast-feeding How should I use this medication? This medication is injected into a vein. It is given by your care team in a hospital or clinic setting. Talk to your care team about the use of this medication in children. Special  care may be needed. Overdosage: If you think you have taken too much of this medicine contact a poison control center or emergency room at once. NOTE: This medicine is only for you. Do not share this medicine with others. What if I miss a dose? Keep appointments for follow-up doses. It is important not to miss your dose. Call your care team if you are unable to keep an appointment. What may interact with this medication? Other medications may affect the way this medication works. Talk with your care team about all of the medications you take. They may suggest changes to your treatment plan to lower the risk of side effects and to make sure your medications work as intended. This list may not describe all possible interactions. Give your health care provider a list of all the medicines, herbs, non-prescription drugs, or dietary supplements you use. Also tell them if you smoke, drink alcohol, or use illegal drugs. Some items may interact with your medicine. What should I watch for while using this medication? Your condition will be monitored carefully while you are receiving this medication. You may need blood work while taking this medication. This medication may make you feel generally unwell. This is not uncommon as chemotherapy can affect healthy cells as well as cancer cells. Report any side effects. Continue your course of treatment even though you feel ill unless your care team tells you to stop. This medication can cause serious allergic reactions. To reduce the risk, your care team may give you other medications to take before receiving this one. Be sure to follow the directions  from your care team. This medication may increase your risk of getting an infection. Call your care team for advice if you get a fever, chills, sore throat, or other symptoms of a cold or flu. Do not treat yourself. Try to avoid being around people who are sick. This medication may increase your risk to bruise or bleed.  Call your care team if you notice any unusual bleeding. Be careful brushing or flossing your teeth or using a toothpick because you may get an infection or bleed more easily. If you have any dental work done, tell your dentist you are receiving this medication. Talk to your care team if you or your partner may be pregnant. Serious birth defects can occur if you take this medication during pregnancy and for 6 months after the last dose. You will need a negative pregnancy test before starting this medication. Contraception is recommended while taking this medication and for 6 months after the last dose. Your care team can help you find the option that works for you. If your partner can get pregnant, use a condom during sex while taking this medication and for 3 months after the last dose. Do not breastfeed while taking this medication and for 2 weeks after the last dose. This medication may cause infertility. Talk to your care team if you are concerned about your fertility. What side effects may I notice from receiving this medication? Side effects that you should report to your care team as soon as possible: Allergic reactions--skin rash, itching, hives, swelling of the face, lips, tongue, or throat Dry cough, shortness of breath or trouble breathing Infection--fever, chills, cough, sore throat, wounds that don't heal, pain or trouble when passing urine, general feeling of discomfort or being unwell Low red blood cell level--unusual weakness or fatigue, dizziness, headache, trouble breathing Pain, tingling, or numbness in the hands or feet Stomach pain, unusual weakness or fatigue, nausea, vomiting, diarrhea, or fever that lasts longer than expected Unusual bruising or bleeding Side effects that usually do not require medical attention (report to your care team if they continue or are bothersome): Diarrhea Fatigue Hair loss Loss of appetite Nausea Vomiting This list may not describe all possible  side effects. Call your doctor for medical advice about side effects. You may report side effects to FDA at 1-800-FDA-1088. Where should I keep my medication? This medication is given in a hospital or clinic. It will not be stored at home. NOTE: This sheet is a summary. It may not cover all possible information. If you have questions about this medicine, talk to your doctor, pharmacist, or health care provider.  2023 Elsevier/Gold Standard (2007-07-09 00:00:00)    Gemcitabine Injection What is this medication? GEMCITABINE (jem SYE ta been) treats some types of cancer. It works by slowing down the growth of cancer cells. This medicine may be used for other purposes; ask your health care provider or pharmacist if you have questions. COMMON BRAND NAME(S): Gemzar, Infugem What should I tell my care team before I take this medication? They need to know if you have any of these conditions: Blood disorders Infection Kidney disease Liver disease Lung or breathing disease, such as asthma or COPD Recent or ongoing radiation therapy An unusual or allergic reaction to gemcitabine, other medications, foods, dyes, or preservatives If you or your partner are pregnant or trying to get pregnant Breast-feeding How should I use this medication? This medication is injected into a vein. It is given by your care team in a  hospital or clinic setting. Talk to your care team about the use of this medication in children. Special care may be needed. Overdosage: If you think you have taken too much of this medicine contact a poison control center or emergency room at once. NOTE: This medicine is only for you. Do not share this medicine with others. What if I miss a dose? Keep appointments for follow-up doses. It is important not to miss your dose. Call your care team if you are unable to keep an appointment. What may interact with this medication? Interactions have not been studied. This list may not describe  all possible interactions. Give your health care provider a list of all the medicines, herbs, non-prescription drugs, or dietary supplements you use. Also tell them if you smoke, drink alcohol, or use illegal drugs. Some items may interact with your medicine. What should I watch for while using this medication? Your condition will be monitored carefully while you are receiving this medication. This medication may make you feel generally unwell. This is not uncommon, as chemotherapy can affect healthy cells as well as cancer cells. Report any side effects. Continue your course of treatment even though you feel ill unless your care team tells you to stop. In some cases, you may be given additional medications to help with side effects. Follow all directions for their use. This medication may increase your risk of getting an infection. Call your care team for advice if you get a fever, chills, sore throat, or other symptoms of a cold or flu. Do not treat yourself. Try to avoid being around people who are sick. This medication may increase your risk to bruise or bleed. Call your care team if you notice any unusual bleeding. Be careful brushing or flossing your teeth or using a toothpick because you may get an infection or bleed more easily. If you have any dental work done, tell your dentist you are receiving this medication. Avoid taking medications that contain aspirin, acetaminophen, ibuprofen, naproxen, or ketoprofen unless instructed by your care team. These medications may hide a fever. Talk to your care team if you or your partner wish to become pregnant or think you might be pregnant. This medication can cause serious birth defects if taken during pregnancy and for 6 months after the last dose. A negative pregnancy test is required before starting this medication. A reliable form of contraception is recommended while taking this medication and for 6 months after the last dose. Talk to your care team  about effective forms of contraception. Do not father a child while taking this medication and for 3 months after the last dose. Use a condom while having sex during this time period. Do not breastfeed while taking this medication and for at least 1 week after the last dose. This medication may cause infertility. Talk to your care team if you are concerned about your fertility. What side effects may I notice from receiving this medication? Side effects that you should report to your care team as soon as possible: Allergic reactions--skin rash, itching, hives, swelling of the face, lips, tongue, or throat Capillary leak syndrome--stomach or muscle pain, unusual weakness or fatigue, feeling faint or lightheaded, decrease in the amount of urine, swelling of the ankles, hands, or feet, trouble breathing Infection--fever, chills, cough, sore throat, wounds that don't heal, pain or trouble when passing urine, general feeling of discomfort or being unwell Liver injury--right upper belly pain, loss of appetite, nausea, light-colored stool, dark yellow or [place] Rho  urine, yellowing skin or eyes, unusual weakness or fatigue Low red blood cell level--unusual weakness or fatigue, dizziness, headache, trouble breathing Lung injury--shortness of breath or trouble breathing, cough, spitting up blood, chest pain, fever Stomach pain, bloody diarrhea, pale skin, unusual weakness or fatigue, decrease in the amount of urine, which may be signs of hemolytic uremic syndrome Sudden and severe headache, confusion, change in vision, seizures, which may be signs of posterior reversible encephalopathy syndrome (PRES) Unusual bruising or bleeding Side effects that usually do not require medical attention (report to your care team if they continue or are bothersome): Diarrhea Drowsiness Hair loss Nausea Pain, redness, or swelling with sores inside the mouth or throat Vomiting This list may not describe all possible side  effects. Call your doctor for medical advice about side effects. You may report side effects to FDA at 1-800-FDA-1088. Where should I keep my medication? This medication is given in a hospital or clinic. It will not be stored at home. NOTE: This sheet is a summary. It may not cover all possible information. If you have questions about this medicine, talk to your doctor, pharmacist, or health care provider.  2023 Elsevier/Gold Standard (2007-07-09 00:00:00)        To help prevent nausea and vomiting after your treatment, we encourage you to take your nausea medication as directed.  BELOW ARE SYMPTOMS THAT SHOULD BE REPORTED IMMEDIATELY: *FEVER GREATER THAN 100.4 F (38 C) OR HIGHER *CHILLS OR SWEATING *NAUSEA AND VOMITING THAT IS NOT CONTROLLED WITH YOUR NAUSEA MEDICATION *UNUSUAL SHORTNESS OF BREATH *UNUSUAL BRUISING OR BLEEDING *URINARY PROBLEMS (pain or burning when urinating, or frequent urination) *BOWEL PROBLEMS (unusual diarrhea, constipation, pain near the anus) TENDERNESS IN MOUTH AND THROAT WITH OR WITHOUT PRESENCE OF ULCERS (sore throat, sores in mouth, or a toothache) UNUSUAL RASH, SWELLING OR PAIN  UNUSUAL VAGINAL DISCHARGE OR ITCHING   Items with * indicate a potential emergency and should be followed up as soon as possible or go to the Emergency Department if any problems should occur.  Please show the CHEMOTHERAPY ALERT CARD or IMMUNOTHERAPY ALERT CARD at check-in to the Emergency Department and triage nurse.  Should you have questions after your visit or need to cancel or reschedule your appointment, please contact [facility] [phone]  and follow the prompts.  Office hours are 8:00 a.m. to 4:30 p.m. Monday - Friday. Please note that voicemails left after 4:00 p.m. may not be returned until the following business day.  We are closed weekends and major holidays. You have access to a nurse at all times for urgent questions. Please call the main  number to the clinic [phone] and follow the prompts.  For any non-urgent questions, you may also contact your provider using MyChart. We now offer e-Visits for anyone 67 and older to request care online for non-urgent symptoms. For details visit [email or web address].   Also download the MyChart app! Go to the app store, search "MyChart", open the app, select [facility], and log in with your MyChart username and password.

## 2022-06-18 NOTE — Progress Notes (Signed)
Patient has been assessed, vital signs and labs have been reviewed by Dr. Katragadda. ANC, Creatinine, LFTs, and Platelets are within treatment parameters per Dr. Katragadda. The patient is good to proceed with treatment at this time. Primary RN and pharmacy aware.  

## 2022-06-19 ENCOUNTER — Other Ambulatory Visit (HOSPITAL_COMMUNITY): Payer: Self-pay

## 2022-06-19 ENCOUNTER — Other Ambulatory Visit: Payer: Self-pay

## 2022-06-22 DIAGNOSIS — D509 Iron deficiency anemia, unspecified: Secondary | ICD-10-CM | POA: Insufficient documentation

## 2022-07-02 ENCOUNTER — Inpatient Hospital Stay: Payer: Medicare HMO | Attending: Hematology

## 2022-07-02 ENCOUNTER — Encounter: Payer: Self-pay | Admitting: Hematology

## 2022-07-02 ENCOUNTER — Inpatient Hospital Stay (HOSPITAL_BASED_OUTPATIENT_CLINIC_OR_DEPARTMENT_OTHER): Payer: Medicare HMO | Admitting: Hematology

## 2022-07-02 ENCOUNTER — Inpatient Hospital Stay: Payer: Medicare HMO

## 2022-07-02 VITALS — BP 163/67 | HR 78 | Temp 96.2°F | Resp 18 | Wt 118.4 lb

## 2022-07-02 VITALS — BP 171/87 | HR 69 | Temp 97.8°F | Resp 18

## 2022-07-02 DIAGNOSIS — D509 Iron deficiency anemia, unspecified: Secondary | ICD-10-CM

## 2022-07-02 DIAGNOSIS — Z95828 Presence of other vascular implants and grafts: Secondary | ICD-10-CM

## 2022-07-02 DIAGNOSIS — Z5111 Encounter for antineoplastic chemotherapy: Secondary | ICD-10-CM | POA: Diagnosis present

## 2022-07-02 DIAGNOSIS — C259 Malignant neoplasm of pancreas, unspecified: Secondary | ICD-10-CM

## 2022-07-02 DIAGNOSIS — D649 Anemia, unspecified: Secondary | ICD-10-CM | POA: Diagnosis not present

## 2022-07-02 DIAGNOSIS — R634 Abnormal weight loss: Secondary | ICD-10-CM | POA: Diagnosis not present

## 2022-07-02 DIAGNOSIS — C250 Malignant neoplasm of head of pancreas: Secondary | ICD-10-CM | POA: Insufficient documentation

## 2022-07-02 LAB — CBC WITH DIFFERENTIAL/PLATELET
Abs Immature Granulocytes: 0.02 10*3/uL (ref 0.00–0.07)
Basophils Absolute: 0.1 10*3/uL (ref 0.0–0.1)
Basophils Relative: 1 %
Eosinophils Absolute: 0.2 10*3/uL (ref 0.0–0.5)
Eosinophils Relative: 3 %
HCT: 30.8 % — ABNORMAL LOW (ref 36.0–46.0)
Hemoglobin: 9.6 g/dL — ABNORMAL LOW (ref 12.0–15.0)
Immature Granulocytes: 0 %
Lymphocytes Relative: 22 %
Lymphs Abs: 1.6 10*3/uL (ref 0.7–4.0)
MCH: 28.3 pg (ref 26.0–34.0)
MCHC: 31.2 g/dL (ref 30.0–36.0)
MCV: 90.9 fL (ref 80.0–100.0)
Monocytes Absolute: 1.1 10*3/uL — ABNORMAL HIGH (ref 0.1–1.0)
Monocytes Relative: 15 %
Neutro Abs: 4.2 10*3/uL (ref 1.7–7.7)
Neutrophils Relative %: 59 %
Platelets: 259 10*3/uL (ref 150–400)
RBC: 3.39 MIL/uL — ABNORMAL LOW (ref 3.87–5.11)
RDW: 16 % — ABNORMAL HIGH (ref 11.5–15.5)
WBC: 7.2 10*3/uL (ref 4.0–10.5)
nRBC: 0 % (ref 0.0–0.2)

## 2022-07-02 LAB — COMPREHENSIVE METABOLIC PANEL
ALT: 81 U/L — ABNORMAL HIGH (ref 0–44)
AST: 57 U/L — ABNORMAL HIGH (ref 15–41)
Albumin: 3.5 g/dL (ref 3.5–5.0)
Alkaline Phosphatase: 243 U/L — ABNORMAL HIGH (ref 38–126)
Anion gap: 9 (ref 5–15)
BUN: 31 mg/dL — ABNORMAL HIGH (ref 8–23)
CO2: 24 mmol/L (ref 22–32)
Calcium: 10 mg/dL (ref 8.9–10.3)
Chloride: 103 mmol/L (ref 98–111)
Creatinine, Ser: 1.05 mg/dL — ABNORMAL HIGH (ref 0.44–1.00)
GFR, Estimated: 52 mL/min — ABNORMAL LOW (ref >60)
Glucose, Bld: 86 mg/dL (ref 70–99)
Potassium: 4.4 mmol/L (ref 3.5–5.1)
Sodium: 136 mmol/L (ref 135–145)
Total Bilirubin: 0.6 mg/dL (ref 0.3–1.2)
Total Protein: 6.6 g/dL (ref 6.5–8.1)

## 2022-07-02 LAB — MAGNESIUM: Magnesium: 2.2 mg/dL (ref 1.7–2.4)

## 2022-07-02 MED ORDER — SODIUM CHLORIDE 0.9 % IV SOLN
10.0000 mg | Freq: Once | INTRAVENOUS | Status: AC
  Administered 2022-07-02: 10 mg via INTRAVENOUS
  Filled 2022-07-02: qty 1

## 2022-07-02 MED ORDER — SODIUM CHLORIDE 0.9% FLUSH
10.0000 mL | Freq: Once | INTRAVENOUS | Status: AC
  Administered 2022-07-02: 10 mL via INTRAVENOUS

## 2022-07-02 MED ORDER — SODIUM CHLORIDE 0.9% FLUSH
10.0000 mL | INTRAVENOUS | Status: DC | PRN
  Administered 2022-07-02: 10 mL

## 2022-07-02 MED ORDER — SODIUM CHLORIDE 0.9 % IV SOLN
500.0000 mg/m2 | Freq: Once | INTRAVENOUS | Status: AC
  Administered 2022-07-02: 760 mg via INTRAVENOUS
  Filled 2022-07-02: qty 19.99

## 2022-07-02 MED ORDER — SODIUM CHLORIDE 0.9 % IV SOLN: Freq: Once | INTRAVENOUS | Status: AC

## 2022-07-02 MED ORDER — ACETAMINOPHEN 325 MG PO TABS
650.0000 mg | ORAL_TABLET | Freq: Once | ORAL | Status: AC
  Administered 2022-07-02: 650 mg via ORAL
  Filled 2022-07-02: qty 2

## 2022-07-02 MED ORDER — PALONOSETRON HCL INJECTION 0.25 MG/5ML
0.2500 mg | Freq: Once | INTRAVENOUS | Status: AC
  Administered 2022-07-02: 0.25 mg via INTRAVENOUS
  Filled 2022-07-02: qty 5

## 2022-07-02 MED ORDER — PACLITAXEL PROTEIN-BOUND CHEMO INJECTION 100 MG
100.0000 mg/m2 | Freq: Once | INTRAVENOUS | Status: AC
  Administered 2022-07-02: 150 mg via INTRAVENOUS
  Filled 2022-07-02: qty 30

## 2022-07-02 MED ORDER — HEPARIN SOD (PORK) LOCK FLUSH 100 UNIT/ML IV SOLN
500.0000 [IU] | Freq: Once | INTRAVENOUS | Status: AC | PRN
  Administered 2022-07-02: 500 [IU]

## 2022-07-02 MED ORDER — SODIUM CHLORIDE 0.9 % IV SOLN
300.0000 mg | Freq: Once | INTRAVENOUS | Status: AC
  Administered 2022-07-02: 300 mg via INTRAVENOUS
  Filled 2022-07-02: qty 300

## 2022-07-02 MED ORDER — CETIRIZINE HCL 10 MG PO TABS
10.0000 mg | ORAL_TABLET | Freq: Once | ORAL | Status: AC
  Administered 2022-07-02: 10 mg via ORAL
  Filled 2022-07-02: qty 1

## 2022-07-02 NOTE — Progress Notes (Signed)
Patient has been examined by Dr. Katragadda, and vital signs and labs have been reviewed. ANC, Creatinine, LFTs, hemoglobin, and platelets are within treatment parameters per M.D. - pt may proceed with treatment.  Primary RN and pharmacy notified.  

## 2022-07-02 NOTE — Patient Instructions (Signed)
[facility]  Discharge Instructions: Thank you for choosing [facility] to provide your oncology and hematology care.  If you have a lab appointment with the [facility], please come in thru the Main Entrance and check in at the main information desk.  Wear comfortable clothing and clothing appropriate for easy access to any Portacath or PICC line.   We strive to give you quality time with your provider. You may need to reschedule your appointment if you arrive late (15 or more minutes).  Arriving late affects you and other patients whose appointments are after yours.  Also, if you miss three or more appointments without notifying the office, you may be dismissed from the clinic at the provider's discretion.      For prescription refill requests, have your pharmacy contact our office and allow 72 hours for refills to be completed.    Today you received the following chemotherapy and/or immunotherapy agents Abraxane/Gemzar.  Paclitaxel Nanoparticle Albumin-Bound Injection What is this medication? NANOPARTICLE ALBUMIN-BOUND PACLITAXEL (Na no PAHR ti kuhl al BYOO muhn-bound PAK li TAX el) treats some types of cancer. It works by slowing down the growth of cancer cells. This medicine may be used for other purposes; ask your health care provider or pharmacist if you have questions. COMMON BRAND NAME(S): Abraxane What should I tell my care team before I take this medication? They need to know if you have any of these conditions: Liver disease Low white blood cell levels An unusual or allergic reaction to paclitaxel, albumin, other medications, foods, dyes, or preservatives If you or your partner are pregnant or trying to get pregnant Breast-feeding How should I use this medication? This medication is injected into a vein. It is given by your care team in a hospital or clinic setting. Talk to your care team about the use of this medication in children. Special  care may be needed. Overdosage: If you think you have taken too much of this medicine contact a poison control center or emergency room at once. NOTE: This medicine is only for you. Do not share this medicine with others. What if I miss a dose? Keep appointments for follow-up doses. It is important not to miss your dose. Call your care team if you are unable to keep an appointment. What may interact with this medication? Other medications may affect the way this medication works. Talk with your care team about all of the medications you take. They may suggest changes to your treatment plan to lower the risk of side effects and to make sure your medications work as intended. This list may not describe all possible interactions. Give your health care provider a list of all the medicines, herbs, non-prescription drugs, or dietary supplements you use. Also tell them if you smoke, drink alcohol, or use illegal drugs. Some items may interact with your medicine. What should I watch for while using this medication? Your condition will be monitored carefully while you are receiving this medication. You may need blood work while taking this medication. This medication may make you feel generally unwell. This is not uncommon as chemotherapy can affect healthy cells as well as cancer cells. Report any side effects. Continue your course of treatment even though you feel ill unless your care team tells you to stop. This medication can cause serious allergic reactions. To reduce the risk, your care team may give you other medications to take before receiving this one. Be sure to follow the directions  from your care team. This medication may increase your risk of getting an infection. Call your care team for advice if you get a fever, chills, sore throat, or other symptoms of a cold or flu. Do not treat yourself. Try to avoid being around people who are sick. This medication may increase your risk to bruise or bleed.  Call your care team if you notice any unusual bleeding. Be careful brushing or flossing your teeth or using a toothpick because you may get an infection or bleed more easily. If you have any dental work done, tell your dentist you are receiving this medication. Talk to your care team if you or your partner may be pregnant. Serious birth defects can occur if you take this medication during pregnancy and for 6 months after the last dose. You will need a negative pregnancy test before starting this medication. Contraception is recommended while taking this medication and for 6 months after the last dose. Your care team can help you find the option that works for you. If your partner can get pregnant, use a condom during sex while taking this medication and for 3 months after the last dose. Do not breastfeed while taking this medication and for 2 weeks after the last dose. This medication may cause infertility. Talk to your care team if you are concerned about your fertility. What side effects may I notice from receiving this medication? Side effects that you should report to your care team as soon as possible: Allergic reactions--skin rash, itching, hives, swelling of the face, lips, tongue, or throat Dry cough, shortness of breath or trouble breathing Infection--fever, chills, cough, sore throat, wounds that don't heal, pain or trouble when passing urine, general feeling of discomfort or being unwell Low red blood cell level--unusual weakness or fatigue, dizziness, headache, trouble breathing Pain, tingling, or numbness in the hands or feet Stomach pain, unusual weakness or fatigue, nausea, vomiting, diarrhea, or fever that lasts longer than expected Unusual bruising or bleeding Side effects that usually do not require medical attention (report to your care team if they continue or are bothersome): Diarrhea Fatigue Hair loss Loss of appetite Nausea Vomiting This list may not describe all possible  side effects. Call your doctor for medical advice about side effects. You may report side effects to FDA at 1-800-FDA-1088. Where should I keep my medication? This medication is given in a hospital or clinic. It will not be stored at home. NOTE: This sheet is a summary. It may not cover all possible information. If you have questions about this medicine, talk to your doctor, pharmacist, or health care provider.  2023 Elsevier/Gold Standard (2007-07-09 00:00:00)    Gemcitabine Injection What is this medication? GEMCITABINE (jem SYE ta been) treats some types of cancer. It works by slowing down the growth of cancer cells. This medicine may be used for other purposes; ask your health care provider or pharmacist if you have questions. COMMON BRAND NAME(S): Gemzar, Infugem What should I tell my care team before I take this medication? They need to know if you have any of these conditions: Blood disorders Infection Kidney disease Liver disease Lung or breathing disease, such as asthma or COPD Recent or ongoing radiation therapy An unusual or allergic reaction to gemcitabine, other medications, foods, dyes, or preservatives If you or your partner are pregnant or trying to get pregnant Breast-feeding How should I use this medication? This medication is injected into a vein. It is given by your care team in a  hospital or clinic setting. Talk to your care team about the use of this medication in children. Special care may be needed. Overdosage: If you think you have taken too much of this medicine contact a poison control center or emergency room at once. NOTE: This medicine is only for you. Do not share this medicine with others. What if I miss a dose? Keep appointments for follow-up doses. It is important not to miss your dose. Call your care team if you are unable to keep an appointment. What may interact with this medication? Interactions have not been studied. This list may not describe  all possible interactions. Give your health care provider a list of all the medicines, herbs, non-prescription drugs, or dietary supplements you use. Also tell them if you smoke, drink alcohol, or use illegal drugs. Some items may interact with your medicine. What should I watch for while using this medication? Your condition will be monitored carefully while you are receiving this medication. This medication may make you feel generally unwell. This is not uncommon, as chemotherapy can affect healthy cells as well as cancer cells. Report any side effects. Continue your course of treatment even though you feel ill unless your care team tells you to stop. In some cases, you may be given additional medications to help with side effects. Follow all directions for their use. This medication may increase your risk of getting an infection. Call your care team for advice if you get a fever, chills, sore throat, or other symptoms of a cold or flu. Do not treat yourself. Try to avoid being around people who are sick. This medication may increase your risk to bruise or bleed. Call your care team if you notice any unusual bleeding. Be careful brushing or flossing your teeth or using a toothpick because you may get an infection or bleed more easily. If you have any dental work done, tell your dentist you are receiving this medication. Avoid taking medications that contain aspirin, acetaminophen, ibuprofen, naproxen, or ketoprofen unless instructed by your care team. These medications may hide a fever. Talk to your care team if you or your partner wish to become pregnant or think you might be pregnant. This medication can cause serious birth defects if taken during pregnancy and for 6 months after the last dose. A negative pregnancy test is required before starting this medication. A reliable form of contraception is recommended while taking this medication and for 6 months after the last dose. Talk to your care team  about effective forms of contraception. Do not father a child while taking this medication and for 3 months after the last dose. Use a condom while having sex during this time period. Do not breastfeed while taking this medication and for at least 1 week after the last dose. This medication may cause infertility. Talk to your care team if you are concerned about your fertility. What side effects may I notice from receiving this medication? Side effects that you should report to your care team as soon as possible: Allergic reactions--skin rash, itching, hives, swelling of the face, lips, tongue, or throat Capillary leak syndrome--stomach or muscle pain, unusual weakness or fatigue, feeling faint or lightheaded, decrease in the amount of urine, swelling of the ankles, hands, or feet, trouble breathing Infection--fever, chills, cough, sore throat, wounds that don't heal, pain or trouble when passing urine, general feeling of discomfort or being unwell Liver injury--right upper belly pain, loss of appetite, nausea, light-colored stool, dark yellow or Erek Kowal  urine, yellowing skin or eyes, unusual weakness or fatigue Low red blood cell level--unusual weakness or fatigue, dizziness, headache, trouble breathing Lung injury--shortness of breath or trouble breathing, cough, spitting up blood, chest pain, fever Stomach pain, bloody diarrhea, pale skin, unusual weakness or fatigue, decrease in the amount of urine, which may be signs of hemolytic uremic syndrome Sudden and severe headache, confusion, change in vision, seizures, which may be signs of posterior reversible encephalopathy syndrome (PRES) Unusual bruising or bleeding Side effects that usually do not require medical attention (report to your care team if they continue or are bothersome): Diarrhea Drowsiness Hair loss Nausea Pain, redness, or swelling with sores inside the mouth or throat Vomiting This list may not describe all possible side  effects. Call your doctor for medical advice about side effects. You may report side effects to FDA at 1-800-FDA-1088. Where should I keep my medication? This medication is given in a hospital or clinic. It will not be stored at home. NOTE: This sheet is a summary. It may not cover all possible information. If you have questions about this medicine, talk to your doctor, pharmacist, or health care provider.  2023 Elsevier/Gold Standard (2007-07-09 00:00:00)        To help prevent nausea and vomiting after your treatment, we encourage you to take your nausea medication as directed.  BELOW ARE SYMPTOMS THAT SHOULD BE REPORTED IMMEDIATELY: *FEVER GREATER THAN 100.4 F (38 C) OR HIGHER *CHILLS OR SWEATING *NAUSEA AND VOMITING THAT IS NOT CONTROLLED WITH YOUR NAUSEA MEDICATION *UNUSUAL SHORTNESS OF BREATH *UNUSUAL BRUISING OR BLEEDING *URINARY PROBLEMS (pain or burning when urinating, or frequent urination) *BOWEL PROBLEMS (unusual diarrhea, constipation, pain near the anus) TENDERNESS IN MOUTH AND THROAT WITH OR WITHOUT PRESENCE OF ULCERS (sore throat, sores in mouth, or a toothache) UNUSUAL RASH, SWELLING OR PAIN  UNUSUAL VAGINAL DISCHARGE OR ITCHING   Items with * indicate a potential emergency and should be followed up as soon as possible or go to the Emergency Department if any problems should occur.  Please show the CHEMOTHERAPY ALERT CARD or IMMUNOTHERAPY ALERT CARD at check-in to the Emergency Department and triage nurse.  Should you have questions after your visit or need to cancel or reschedule your appointment, please contact [facility] [phone]  and follow the prompts.  Office hours are 8:00 a.m. to 4:30 p.m. Monday - Friday. Please note that voicemails left after 4:00 p.m. may not be returned until the following business day.  We are closed weekends and major holidays. You have access to a nurse at all times for urgent questions. Please call the main  number to the clinic [phone] and follow the prompts.  For any non-urgent questions, you may also contact your provider using MyChart. We now offer e-Visits for anyone 33 and older to request care online for non-urgent symptoms. For details visit [email or web address].   Also download the MyChart app! Go to the app store, search "MyChart", open the app, select [facility], and log in with your MyChart username and password.

## 2022-07-02 NOTE — Progress Notes (Signed)
[place], [place] [address]   CLINIC:  Medical Oncology/Hematology  PCP:  Abigail Miyamoto, [place] / [place] [address] [phone]   REASON FOR VISIT:  Follow-up for pancreatic adenocarcinoma  PRIOR THERAPY: none  NGS Results: not done  CURRENT THERAPY: Gemcitabine and Abraxane  BRIEF ONCOLOGIC HISTORY:  Oncology History  Pancreatic carcinoma ([place])  11/11/2021 Initial Diagnosis   Pancreatic carcinoma ([place])   02/05/2022 -  Chemotherapy   Patient is on Treatment Plan : PANCREATIC Abraxane D1,15 + Gemcitabine D1,15 q28d       CANCER STAGING:  Cancer Staging  Pancreatic carcinoma ([place]) Staging form: Exocrine Pancreas, AJCC 8th Edition - Clinical stage from 11/11/2021: Stage IB (cT2, cN0, cM0) - Unsigned   INTERVAL HISTORY:  [name], a 88 y.o. female, seen for follow-up of pancreatic cancer.  She is here for next cycle of chemotherapy and toxicity assessment.  She was last seen by me on 06/18/22. She is accompanied by her daughter today.  Today, she states that she is doing well overall. Her appetite level is at 100%. She has gained 4lbs since her last visit. She is still drinking Ensure or Boost once daily. Her energy level is at 100%. She has only occasional, mild abdominal discomfort with eating some foods such as lettuce or cabbage. She feels that physically she has felt better over the last x2 weeks. Her neuropathic symptoms have resolved. She reports occasional mild ankle swelling bilaterally.  She lost her youngest sister in April 2023 then lost her husband, and has since lost her other sister last week. Patient reports that she had enough energy to stand for x1.5 hours at her sister's visitation and walk to the grave site and back. She feels that her hair loss is associated with her stress.   REVIEW OF SYSTEMS:  Review of Systems  Constitutional:  Negative for chills, fatigue and fever.   HENT:   Negative for lump/mass, mouth sores, nosebleeds, sore throat and trouble swallowing.   Eyes:  Negative for eye problems.  Respiratory:  Negative for cough.   Cardiovascular:  Positive for leg swelling (ankles, occasional). Negative for chest pain and palpitations.  Gastrointestinal:  Positive for abdominal pain (rare, after eating specific foods) and constipation. Negative for diarrhea, nausea and vomiting.  Genitourinary:  Negative for bladder incontinence, difficulty urinating, dysuria, frequency, hematuria and nocturia.   Musculoskeletal:  Negative for arthralgias, back pain, flank pain, myalgias and neck pain.  Skin:  Negative for itching and rash.  Neurological:  Negative for dizziness, headaches and numbness.  Hematological:  Does not bruise/bleed easily.  Psychiatric/Behavioral:  Positive for sleep disturbance. Negative for depression and suicidal ideas. The patient is not nervous/anxious.   All other systems reviewed and are negative.   PAST MEDICAL/SURGICAL HISTORY:  Past Medical History:  Diagnosis Date   Essential tremor    GERD (gastroesophageal reflux disease)    High cholesterol    Hypertension    PONV (postoperative nausea and vomiting)    Port-A-Cath in place 01/27/2022   Vertigo    Past Surgical History:  Procedure Laterality Date   BILIARY BRUSHING  09/10/2021   Procedure: BILIARY BRUSHING;  Surgeon: Irene Shipper, MD;  Location: [facility] ENDOSCOPY;  Service: Gastroenterology;;   BILIARY BRUSHING  10/29/2021   Procedure: BILIARY BRUSHING;  Surgeon: Irving Copas., MD;  Location: [facility] ENDOSCOPY;  Service: Gastroenterology;;   BILIARY DILATION  10/29/2021   Procedure: BILIARY DILATION;  Surgeon:  Mansouraty, Telford Nab., MD;  Location: [facility] ENDOSCOPY;  Service: Gastroenterology;;   BILIARY STENT PLACEMENT  09/10/2021   Procedure: BILIARY STENT PLACEMENT;  Surgeon: Irene Shipper, MD;  Location: [facility] ENDOSCOPY;  Service: Gastroenterology;;   BILIARY STENT PLACEMENT  N/A 10/03/2021   Procedure: BILIARY STENT PLACEMENT;  Surgeon: Gatha Mayer, MD;  Location: [facility] ENDOSCOPY;  Service: Gastroenterology;  Laterality: N/A;   BILIARY STENT PLACEMENT N/A 10/29/2021   Procedure: BILIARY STENT PLACEMENT;  Surgeon: Rush Landmark Telford Nab., MD;  Location: [facility] ENDOSCOPY;  Service: Gastroenterology;  Laterality: N/A;   BILIARY STENT PLACEMENT N/A 05/08/2022   Procedure: BILIARY STENT PLACEMENT;  Surgeon: Gatha Mayer, MD;  Location: [facility] ENDOSCOPY;  Service: Gastroenterology;  Laterality: N/A;   BIOPSY  10/29/2021   Procedure: BIOPSY;  Surgeon: Rush Landmark Telford Nab., MD;  Location: [facility] ENDOSCOPY;  Service: Gastroenterology;;   ENDOSCOPIC RETROGRADE CHOLANGIOPANCREATOGRAPHY (ERCP) WITH PROPOFOL N/A 10/29/2021   Procedure: ENDOSCOPIC RETROGRADE CHOLANGIOPANCREATOGRAPHY (ERCP) WITH PROPOFOL;  Surgeon: Irving Copas., MD;  Location: [facility] ENDOSCOPY;  Service: Gastroenterology;  Laterality: N/A;   ERCP N/A 09/10/2021   Procedure: ENDOSCOPIC RETROGRADE CHOLANGIOPANCREATOGRAPHY (ERCP);  Surgeon: Irene Shipper, MD;  Location: [facility];  Service: Gastroenterology;  Laterality: N/A;   ERCP N/A 10/03/2021   Procedure: ENDOSCOPIC RETROGRADE CHOLANGIOPANCREATOGRAPHY (ERCP);  Surgeon: Gatha Mayer, MD;  Location: [facility] ENDOSCOPY;  Service: Gastroenterology;  Laterality: N/A;   ERCP N/A 05/08/2022   Procedure: ENDOSCOPIC RETROGRADE CHOLANGIOPANCREATOGRAPHY (ERCP);  Surgeon: Gatha Mayer, MD;  Location: [facility] ENDOSCOPY;  Service: Gastroenterology;  Laterality: N/A;   ESOPHAGOGASTRODUODENOSCOPY (EGD) WITH PROPOFOL N/A 10/29/2021   Procedure: ESOPHAGOGASTRODUODENOSCOPY (EGD) WITH PROPOFOL;  Surgeon: Rush Landmark Telford Nab., MD;  Location: [facility] ENDOSCOPY;  Service: Gastroenterology;  Laterality: N/A;   EUS N/A 10/29/2021   Procedure: UPPER ENDOSCOPIC ULTRASOUND (EUS) RADIAL;  Surgeon: Irving Copas., MD;  Location: [facility] ENDOSCOPY;  Service: Gastroenterology;  Laterality: N/A;   FINE  NEEDLE ASPIRATION  10/29/2021   Procedure: FINE NEEDLE ASPIRATION (FNA) LINEAR;  Surgeon: Irving Copas., MD;  Location: [facility] ENDOSCOPY;  Service: Gastroenterology;;   IR IMAGING GUIDED PORT INSERTION  01/28/2022   PACEMAKER IMPLANT     REMOVAL OF STONES  10/29/2021   Procedure: REMOVAL OF SLUDGE;  Surgeon: Irving Copas., MD;  Location: [facility] ENDOSCOPY;  Service: Gastroenterology;;   Joan Mayans  09/10/2021   Procedure: Joan Mayans;  Surgeon: Irene Shipper, MD;  Location: [facility] ENDOSCOPY;  Service: Gastroenterology;;   Bess Kinds CHOLANGIOSCOPY N/A 10/29/2021   Procedure: PQDIYMEB CHOLANGIOSCOPY;  Surgeon: Irving Copas., MD;  Location: [facility] ENDOSCOPY;  Service: Gastroenterology;  Laterality: N/A;   STENT REMOVAL  10/03/2021   Procedure: STENT REMOVAL;  Surgeon: Gatha Mayer, MD;  Location: [facility] ENDOSCOPY;  Service: Gastroenterology;;    SOCIAL HISTORY:  Social History   Socioeconomic History   Marital status: Married    Spouse name: Not on file   Number of children: Not on file   Years of education: Not on file   Highest education level: Not on file  Occupational History   Not on file  Tobacco Use   Smoking status: Never   Smokeless tobacco: Never  Vaping Use   Vaping Use: Never used  Substance and Sexual Activity   Alcohol use: Never   Drug use: Never   Sexual activity: Not on file  Other Topics Concern   Not on file  Social History Narrative   Not on file   Social Determinants of Health   Financial Resource Strain: Not  on file  Food Insecurity: No Food Insecurity (05/07/2022)   Hunger Vital Sign    Worried About Running Out of Food in the Last Year: Never true    Ran Out of Food in the Last Year: Never true  Transportation Needs: No Transportation Needs (05/07/2022)   PRAPARE - Hydrologist (Medical): No    Lack of Transportation (Non-Medical): No  Physical Activity: Not on file  Stress: Not on file  Social Connections:  Not on file  Intimate Partner Violence: Not At Risk (05/07/2022)   Humiliation, Afraid, Rape, and Kick questionnaire    Fear of Current or Ex-Partner: No    Emotionally Abused: No    Physically Abused: No    Sexually Abused: No    FAMILY HISTORY:  Family History  Problem Relation Age of Onset   Stomach cancer Neg Hx    Esophageal cancer Neg Hx    Colon cancer Neg Hx    Pancreatic cancer Neg Hx     CURRENT MEDICATIONS:  Current Outpatient Medications  Medication Sig Dispense Refill   amLODipine (NORVASC) 2.5 MG tablet Take 2.5 mg by mouth at bedtime.     aspirin EC 81 MG tablet Take 81 mg by mouth daily. Swallow whole.     Cholecalciferol (VITAMIN D3) 50 MCG (2000 UT) TABS Take 2,000 Units by mouth daily after supper.     Coenzyme Q10 (CO Q-10) 100 MG CAPS Take 100 mg by mouth every evening.     famotidine (PEPCID) 40 MG tablet Take 40 mg by mouth every evening.     gabapentin (NEURONTIN) 100 MG capsule Take 100 mg by mouth 2 (two) times daily.     lidocaine-prilocaine (EMLA) cream Apply a small amount to port a cath site (do not rub in) and cover with plastic wrap 1 hour prior to infusion appointments 30 g 3   meclizine (ANTIVERT) 25 MG tablet Take 25 mg by mouth daily as needed for dizziness.     Misc Natural Products (JOINT SUPPORT) CAPS Take 1 capsule by mouth daily with breakfast.     Multiple Vitamin (MULTIVITAMIN) tablet Take 1 tablet by mouth daily with breakfast.     Multiple Vitamins-Minerals (OCUVITE EYE HEALTH FORMULA) CAPS Take 1 capsule by mouth daily.     nebivolol (BYSTOLIC) 5 MG tablet Take 2.5 mg by mouth in the morning.     omeprazole (PRILOSEC) 40 MG capsule Take 40 mg by mouth daily before breakfast.     polyethylene glycol (MIRALAX) 17 g packet Take 17 g by mouth daily as needed. (Patient taking differently: Take 17 g by mouth daily as needed for moderate constipation.) 14 each 0   Polyvinyl Alcohol-Povidone (REFRESH OP) Place 1 drop into both eyes daily as  needed (tired eyes).     predniSONE (DELTASONE) 50 MG tablet Take 13 hours, 7 hours and 1 hour prior to CT scan 3 tablet 0   primidone (MYSOLINE) 50 MG tablet Take 25 mg by mouth daily as needed (for tremors).     prochlorperazine (COMPAZINE) 10 MG tablet Take 1 tablet (10 mg total) by mouth every 6 (six) hours as needed for nausea or vomiting. 60 tablet 6   pyridOXINE (VITAMIN B-6) 100 MG tablet Take 50 mg by mouth in the morning.     tiZANidine (ZANAFLEX) 2 MG tablet Take 1 mg by mouth daily as needed for muscle spasms.     diphenhydrAMINE (BENADRYL) 50 MG tablet Take 1 tablet (50 mg  total) by mouth once for 1 dose. Take 1 hour prior to CT scan 1 tablet 0   No current facility-administered medications for this visit.   Facility-Administered Medications Ordered in Other Visits  Medication Dose Route Frequency Provider Last Rate Last Admin   iron sucrose (VENOFER) 300 mg in sodium chloride 0.9 % 250 mL IVPB  300 mg Intravenous Once Derek Jack, MD        ALLERGIES:  Allergies  Allergen Reactions   Ivp Dye [Iodinated Contrast Media] Hives   Lisinopril Cough    PHYSICAL EXAM:  Performance status (ECOG): 1 - Symptomatic but completely ambulatory  There were no vitals filed for this visit.  Wt Readings from Last 3 Encounters:  07/02/22 53.7 kg (118 lb 6.4 oz)  06/18/22 52 kg (114 lb 11.2 oz)  06/03/22 52.8 kg (116 lb 6.4 oz)   Physical Exam Vitals reviewed. Exam conducted with a chaperone present.  Constitutional:      Appearance: Normal appearance.  Cardiovascular:     Rate and Rhythm: Normal rate and regular rhythm.     Pulses: Normal pulses.     Heart sounds: Normal heart sounds.  Pulmonary:     Effort: Pulmonary effort is normal.     Breath sounds: Normal breath sounds.  Abdominal:     Palpations: Abdomen is soft. There is no hepatomegaly, splenomegaly or mass.     Tenderness: There is no abdominal tenderness.  Lymphadenopathy:     Upper Body:     Right upper  body: No supraclavicular, axillary or pectoral adenopathy.     Left upper body: No supraclavicular, axillary or pectoral adenopathy.  Neurological:     General: No focal deficit present.     Mental Status: She is alert and oriented to person, place, and time.  Psychiatric:        Mood and Affect: Mood normal.        Behavior: Behavior normal.      LABORATORY DATA:  I have reviewed the labs as listed.     Latest Ref Rng & Units 07/02/2022   10:10 AM 06/18/2022   11:55 AM 06/03/2022    9:36 AM  CBC  WBC 4.0 - 10.5 K/uL 7.2  6.7  5.5   Hemoglobin 12.0 - 15.0 g/dL 9.6  9.9  9.6   Hematocrit 36.0 - 46.0 % 30.8  31.3  30.3   Platelets 150 - 400 K/uL 259  267  340       Latest Ref Rng & Units 06/18/2022   11:55 AM 06/03/2022    9:36 AM 05/18/2022   12:38 PM  CMP  Glucose 70 - 99 mg/dL 107  111  110   BUN 8 - 23 mg/dL 32  31  28   Creatinine 0.44 - 1.00 mg/dL 1.00  0.99  1.20   Sodium 135 - 145 mmol/L 134  135  136   Potassium 3.5 - 5.1 mmol/L 4.7  4.2  4.2   Chloride 98 - 111 mmol/L 101  104  103   CO2 22 - 32 mmol/L '23  24  26   '$ Calcium 8.9 - 10.3 mg/dL 9.7  9.8  9.9   Total Protein 6.5 - 8.1 g/dL 6.9  6.5  6.5   Total Bilirubin 0.3 - 1.2 mg/dL 0.8  1.2  1.8   Alkaline Phos 38 - 126 U/L 192  155  214   AST 15 - 41 U/L 36  32  47   ALT 0 -  44 U/L 41  33  63    Lab Results  Component Value Date   IRON 55 06/18/2022   TIBC 438 06/18/2022   FERRITIN 32 06/18/2022     DIAGNOSTIC IMAGING:  I have independently reviewed the scans and discussed with the patient. No results found.    ASSESSMENT:  T2 N0 M1 pancreatic adenocarcinoma: - She presented with painless jaundice in April 2023.  Weight loss of 13 pounds since April. - Stent placement on 09/10/2021 for a bilirubin of 8.2. - 10/03/2021: ERCP and stent removal and stent placement for stent dysfunction with total bilirubin 16.3. - 10/29/2021: ERCP/EUS: Lower CBD stent stenosis from stricture, status post metal stent placement.   EUS showed mass in the superior pancreatic head measuring 2.8 x 2.2 cm, suggesting abutment of the portal vein.  Intact interface was seen between lesion and SMA and celiac trunk suggesting lack of invasion.  Hypoechoic 14 x 12 mm lesion visualized in the liver.  Biopsy could not be attempted due to presence of blood vessel. - She had syncopal episodes in 07/10/22, had pacemaker placement for bradycardia. - Pathology: Pancreatic head FNA and biliary stricture brushing: Malignant cells consistent with adenocarcinoma. - CA 19-9: 131 - We reviewed MRI of the abdomen with and without contrast (12/24/2021): Pancreatic head mass measures 2.7 x 2.3 cm (2.5 x 1.8 cm previously).  Mass is abutting and slightly displacing the SMA.  No obvious fat plane is demonstrated.  Portal and splenic veins are patent.  Stable small scattered hepatic cysts with no liver metastatic disease. - PET scan (11/20/2021): Ill-defined low-level hypermetabolic activity about the metallic CBD stent and pancreatic uncinate process without discrete CT correlate.  No convincing evidence of hypermetabolic disease in the neck, chest, abdomen or pelvis.  Tiny bilateral lung nodules measuring up to 3 mm.  -CT pancreatic protocol (01/16/2022): Hypoenhancing mass in the pancreatic head measuring 2.9 x 1.8 cm.  Slightly greater than 180 degrees of abutment with portal vein as well as SMA without definite vascular encasement.  New enhancing 9 mm soft tissue nodule along the posterior aspect of the gallbladder wall?  Suspicious for peritoneal implant. -I have discussed with Dr. Zenia Resides who has seen this patient. -Dr. Zenia Resides thought that this patient might not be a candidate for surgery but she would like to evaluate after 3 to 4 months of neoadjuvant chemotherapy. -We talked about neoadjuvant chemotherapy.  She is not a candidate for FOLFIRINOX.  I have recommended gemcitabine and Abraxane every other week.  Cycle 1 was started on 02/05/2022.  Social/family  history: - She lives at home with her husband who has dementia.  She worked as a Char[employer] fundraiser for 33 years.  Non-smoker and no exposure to chemicals.  She has not been driving since 10-Jul-2022. - Daughter died of glioblastoma.  Mother had ovarian cancer.  Maternal aunt had breast cancer.   PLAN:  T2 N0 M0 pancreatic adenocarcinoma: - She has tolerated last cycle of chemotherapy very well. - Reviewed labs today which showed AST elevated at 57 and ALT of 81.  Creatinine is 1.05.  CBC shows normal white count and platelet count.  Last CEA 19-9 was 271.  Level from today is pending. - Recommend proceeding with C4 day 15 today.  RTC 2 weeks for follow-up.  Will plan on repeating CT abdomen pancreatic protocol and CT scan of the lung.  2.  Weight loss: - She is eating better with the 3 meals per day and 1 can of  Ensure high-protein per day.  She has gained 3 to 4 pounds since last visit.  3.  Normocytic anemia: - Hemoglobin today is 9.6.  Ferritin is 32 and percent saturation 13. - Will start Feraheme weekly x 2 today.    Orders placed this encounter:  Orders Placed This Encounter  Procedures   CBC with Differential   Comprehensive metabolic panel   CBC with Differential   Comprehensive metabolic panel     I,Alexis Herring,acting as a scribe for Derek Jack, MD.,have documented all relevant documentation on the behalf of Derek Jack, MD,as directed by  Derek Jack, MD while in the presence of Derek Jack, MD.  I, Derek Jack MD, have reviewed the above documentation for accuracy and completeness, and I agree with the above.   Derek Jack, MD [place] [phone]

## 2022-07-02 NOTE — Patient Instructions (Signed)
[place] at [facility] Discharge Instructions   You were seen and examined today by Dr. Delton Coombes.  He reviewed the results of your lab work which are normal/stable.   We will proceed with your treatment today. We will also give you iron infusion.   We will repeat a CT scan prior to your next visit.   Return as scheduled.    Thank you for choosing [place] at [facility] to provide your oncology and hematology care.  To afford each patient quality time with our provider, please arrive at least 15 minutes before your scheduled appointment time.   If you have a lab appointment with the [facility] please come in thru the Main Entrance and check in at the main information desk.  You need to re-schedule your appointment should you arrive 10 or more minutes late.  We strive to give you quality time with our providers, and arriving late affects you and other patients whose appointments are after yours.  Also, if you no show three or more times for appointments you may be dismissed from the clinic at the providers discretion.     Again, thank you for choosing [facility].  Our hope is that these requests will decrease the amount of time that you wait before being seen by our physicians.       _____________________________________________________________  Should you have questions after your visit to [facility], please contact our office at [phone] and follow the prompts.  Our office hours are 8:00 a.m. and 4:30 p.m. Monday - Friday.  Please note that voicemails left after 4:00 p.m. may not be returned until the following business day.  We are closed weekends and major holidays.  You do have access to a nurse 24-7, just call the main number to the clinic [phone] and do not press any options, hold on the line and a nurse will answer the phone.    For prescription refill requests, have your pharmacy contact  our office and allow 72 hours.    Due to Covid, you will need to wear a mask upon entering the hospital. If you do not have a mask, a mask will be given to you at the Main Entrance upon arrival. For doctor visits, patients may have 1 support person age 86 or older with them. For treatment visits, patients can not have anyone with them due to social distancing guidelines and our immunocompromised population.

## 2022-07-02 NOTE — Progress Notes (Signed)
Patient presents today for Venofer infusion and chemotherapy infusion.  Patient is satisfactory condition with no complaints voiced.  Vital signs are stable.  Labs reviewed by Dr. Delton Coombes during her office visit.  All labs are within treatment parameters.  We will proceed with infusions per MD orders.   Patient tolerated treatment well with no complaints voiced.  Patient left ambulatory in stable condition.  Vital signs stable at discharge.  Follow up as scheduled.

## 2022-07-03 ENCOUNTER — Other Ambulatory Visit: Payer: Self-pay

## 2022-07-04 LAB — CANCER ANTIGEN 19-9: CA 19-9: 104 U/mL — ABNORMAL HIGH (ref 0–35)

## 2022-07-06 ENCOUNTER — Other Ambulatory Visit: Payer: Self-pay

## 2022-07-08 ENCOUNTER — Other Ambulatory Visit: Payer: Self-pay

## 2022-07-09 ENCOUNTER — Ambulatory Visit (HOSPITAL_COMMUNITY)
Admission: RE | Admit: 2022-07-09 | Discharge: 2022-07-09 | Disposition: A | Discharge status: Home / Self Care | Payer: Medicare HMO | Source: Ambulatory Visit | Attending: Hematology | Admitting: Hematology

## 2022-07-09 DIAGNOSIS — C259 Malignant neoplasm of pancreas, unspecified: Secondary | ICD-10-CM | POA: Diagnosis present

## 2022-07-09 MED ORDER — IOHEXOL 300 MG/ML  SOLN
100.0000 mL | Freq: Once | INTRAMUSCULAR | Status: AC | PRN
  Administered 2022-07-09: 100 mL via INTRAVENOUS

## 2022-07-15 ENCOUNTER — Inpatient Hospital Stay: Payer: Medicare HMO | Admitting: Hematology

## 2022-07-15 ENCOUNTER — Inpatient Hospital Stay (HOSPITAL_BASED_OUTPATIENT_CLINIC_OR_DEPARTMENT_OTHER): Payer: Medicare HMO | Admitting: Hematology

## 2022-07-15 ENCOUNTER — Inpatient Hospital Stay: Payer: Medicare HMO

## 2022-07-15 ENCOUNTER — Encounter: Payer: Self-pay | Admitting: Hematology

## 2022-07-15 DIAGNOSIS — C259 Malignant neoplasm of pancreas, unspecified: Secondary | ICD-10-CM

## 2022-07-15 DIAGNOSIS — Z95828 Presence of other vascular implants and grafts: Secondary | ICD-10-CM

## 2022-07-15 DIAGNOSIS — Z5111 Encounter for antineoplastic chemotherapy: Secondary | ICD-10-CM | POA: Diagnosis not present

## 2022-07-15 DIAGNOSIS — D509 Iron deficiency anemia, unspecified: Secondary | ICD-10-CM

## 2022-07-15 LAB — CBC WITH DIFFERENTIAL/PLATELET
Abs Immature Granulocytes: 0.02 10*3/uL (ref 0.00–0.07)
Basophils Absolute: 0.1 10*3/uL (ref 0.0–0.1)
Basophils Relative: 1 %
Eosinophils Absolute: 0.3 10*3/uL (ref 0.0–0.5)
Eosinophils Relative: 4 %
HCT: 30.8 % — ABNORMAL LOW (ref 36.0–46.0)
Hemoglobin: 9.8 g/dL — ABNORMAL LOW (ref 12.0–15.0)
Immature Granulocytes: 0 %
Lymphocytes Relative: 24 %
Lymphs Abs: 1.4 10*3/uL (ref 0.7–4.0)
MCH: 28.8 pg (ref 26.0–34.0)
MCHC: 31.8 g/dL (ref 30.0–36.0)
MCV: 90.6 fL (ref 80.0–100.0)
Monocytes Absolute: 0.8 10*3/uL (ref 0.1–1.0)
Monocytes Relative: 14 %
Neutro Abs: 3.4 10*3/uL (ref 1.7–7.7)
Neutrophils Relative %: 57 %
Platelets: 257 10*3/uL (ref 150–400)
RBC: 3.4 MIL/uL — ABNORMAL LOW (ref 3.87–5.11)
RDW: 16.6 % — ABNORMAL HIGH (ref 11.5–15.5)
WBC: 5.9 10*3/uL (ref 4.0–10.5)
nRBC: 0 % (ref 0.0–0.2)

## 2022-07-15 LAB — COMPREHENSIVE METABOLIC PANEL
ALT: 43 U/L (ref 0–44)
AST: 31 U/L (ref 15–41)
Albumin: 3.4 g/dL — ABNORMAL LOW (ref 3.5–5.0)
Alkaline Phosphatase: 148 U/L — ABNORMAL HIGH (ref 38–126)
Anion gap: 8 (ref 5–15)
BUN: 30 mg/dL — ABNORMAL HIGH (ref 8–23)
CO2: 24 mmol/L (ref 22–32)
Calcium: 9.7 mg/dL (ref 8.9–10.3)
Chloride: 105 mmol/L (ref 98–111)
Creatinine, Ser: 1.02 mg/dL — ABNORMAL HIGH (ref 0.44–1.00)
GFR, Estimated: 54 mL/min — ABNORMAL LOW (ref >60)
Glucose, Bld: 105 mg/dL — ABNORMAL HIGH (ref 70–99)
Potassium: 4.5 mmol/L (ref 3.5–5.1)
Sodium: 137 mmol/L (ref 135–145)
Total Bilirubin: 0.5 mg/dL (ref 0.3–1.2)
Total Protein: 6.4 g/dL — ABNORMAL LOW (ref 6.5–8.1)

## 2022-07-15 LAB — MAGNESIUM: Magnesium: 2.1 mg/dL (ref 1.7–2.4)

## 2022-07-15 MED ORDER — HEPARIN SOD (PORK) LOCK FLUSH 100 UNIT/ML IV SOLN
500.0000 [IU] | Freq: Once | INTRAVENOUS | Status: AC
  Administered 2022-07-15: 500 [IU] via INTRAVENOUS

## 2022-07-15 MED ORDER — SODIUM CHLORIDE 0.9 % IV SOLN
300.0000 mg | Freq: Once | INTRAVENOUS | Status: AC
  Administered 2022-07-15: 300 mg via INTRAVENOUS
  Filled 2022-07-15: qty 300

## 2022-07-15 MED ORDER — SODIUM CHLORIDE 0.9 % IV SOLN: Freq: Once | INTRAVENOUS | Status: AC

## 2022-07-15 MED ORDER — SODIUM CHLORIDE 0.9% FLUSH
10.0000 mL | Freq: Once | INTRAVENOUS | Status: AC
  Administered 2022-07-15: 10 mL via INTRAVENOUS

## 2022-07-15 MED ORDER — ACETAMINOPHEN 325 MG PO TABS
650.0000 mg | ORAL_TABLET | Freq: Once | ORAL | Status: AC
  Administered 2022-07-15: 650 mg via ORAL
  Filled 2022-07-15: qty 2

## 2022-07-15 MED ORDER — CETIRIZINE HCL 10 MG PO TABS
10.0000 mg | ORAL_TABLET | Freq: Once | ORAL | Status: AC
  Administered 2022-07-15: 10 mg via ORAL
  Filled 2022-07-15: qty 1

## 2022-07-15 NOTE — Progress Notes (Signed)
Meagan [name] [address] [place] [address]    Clinic Day:  07/15/2022  Referring physician: Abigail Miyamoto, [place]  Patient Care Team: Abigail Miyamoto, [place] as PCP - General (Family Medicine) Derek Jack, MD as Medical Oncologist (Medical Oncology) Brien Mates, RN as Oncology Nurse Navigator (Medical Oncology)   ASSESSMENT & PLAN:   Assessment: T2 N0 M1 pancreatic adenocarcinoma: - She presented with painless jaundice in April 2023.  Weight loss of 13 pounds since April. - Stent placement on 09/10/2021 for a bilirubin of 8.2. - 10/03/2021: ERCP and stent removal and stent placement for stent dysfunction with total bilirubin 16.3. - 10/29/2021: ERCP/EUS: Lower CBD stent stenosis from stricture, status post metal stent placement.  EUS showed mass in the superior pancreatic head measuring 2.8 x 2.2 cm, suggesting abutment of the portal vein.  Intact interface was seen between lesion and SMA and celiac trunk suggesting lack of invasion.  Hypoechoic 14 x 12 mm lesion visualized in the liver.  Biopsy could not be attempted due to presence of blood vessel. - She had syncopal episodes in 06-21-2022, had pacemaker placement for bradycardia. - Pathology: Pancreatic head FNA and biliary stricture brushing: Malignant cells consistent with adenocarcinoma. - CA 19-9: 131 - We reviewed MRI of the abdomen with and without contrast (12/24/2021): Pancreatic head mass measures 2.7 x 2.3 cm (2.5 x 1.8 cm previously).  Mass is abutting and slightly displacing the SMA.  No obvious fat plane is demonstrated.  Portal and splenic veins are patent.  Stable small scattered hepatic cysts with no liver metastatic disease. - PET scan (11/20/2021): Ill-defined low-level hypermetabolic activity about the metallic CBD stent and pancreatic uncinate process without discrete CT correlate.  No convincing evidence of hypermetabolic disease in the neck, chest, abdomen or pelvis.  Tiny bilateral lung  nodules measuring up to 3 mm.  -CT pancreatic protocol (01/16/2022): Hypoenhancing mass in the pancreatic head measuring 2.9 x 1.8 cm.  Slightly greater than 180 degrees of abutment with portal vein as well as SMA without definite vascular encasement.  New enhancing 9 mm soft tissue nodule along the posterior aspect of the gallbladder wall?  Suspicious for peritoneal implant. -I have discussed with Dr. Zenia Resides who has seen this patient. -Dr. Zenia Resides thought that this patient might not be a candidate for surgery but she would like to evaluate after 3 to 4 months of neoadjuvant chemotherapy. -We talked about neoadjuvant chemotherapy.  She is not a candidate for FOLFIRINOX.  I have recommended gemcitabine and Abraxane every other week.  Cycle 1 was started on 02/05/2022.   Social/family history: - She lives at home with her husband who has dementia.  She worked as a Char[employer] fundraiser for 33 years.  Non-smoker and no exposure to chemicals.  She has not been driving since June 21, 2022. - Daughter died of glioblastoma.  Mother had ovarian cancer.  Maternal aunt had breast cancer.  Plan: T2 N0 M0 pancreatic adenocarcinoma: - She has completed cycle 4-day 15 on 07/02/2022, she tolerated very well. - She is able to do all her household activities.  She is eating well and maintaining her weight. - Reviewed labs today which showed alkaline phosphatase is better at 148.  Creatinine is elevated at 1.02 and stable.  Other LFTs are normal.  CBC was grossly normal.  Last CEA 19-9 was 104 on 07/02/2022 down from 271. - Reviewed CT CAP from 07/09/2022: Pancreatic head mass is similar to slightly larger today measuring 3.2 x 2.4 cm  compared to 2.9 x 1.8 cm.  There was SMA abutment over about 180 degree previously and this appears slightly progressive in the interval with soft tissue surrounding the SMA by at least 270 degree today.  No evidence of distant metastatic disease. - I have recommended that we hold off on current regimen of  gemcitabine and Abraxane. - Options include changing systemic therapy to FOLFIRINOX/FOLFOX/NALIRIFOX followed by chemoradiation therapy. - Will also consider NGS testing to see candidate for any targetable treatments. - She does not want to get surgery done. - RTC 2 weeks for follow-up.  2.  Weight loss: - Continue 1 can of Ensure high-protein daily.  She is eating 3 meals per day and is gaining weight.   3.  Normocytic anemia: - Proceed with second dose of Venofer today. She felt improved energy levels after first dose.    No orders of the defined types were placed in this encounter.     Beverly Gust Oliver,acting as a scribe for Derek Jack, MD.,have documented all relevant documentation on the behalf of Derek Jack, MD,as directed by  Derek Jack, MD while in the presence of Derek Jack, MD.   I, Derek Jack MD, have reviewed the above documentation for accuracy and completeness, and I agree with the above.   Doyce Loose   2/14/202412:56 PM  CHIEF COMPLAINT:   Diagnosis: pancreatic adenocarcinoma    Cancer Staging  Pancreatic carcinoma [facility]) Staging form: Exocrine Pancreas, AJCC 8th Edition - Clinical stage from 11/11/2021: Stage IB (cT2, cN0, cM0) - Unsigned    Prior Therapy: none  Current Therapy:  Gemcitabine and Abraxane    HISTORY OF PRESENT ILLNESS:   Oncology History  Pancreatic carcinoma ([place])  11/11/2021 Initial Diagnosis   Pancreatic carcinoma ([place])   02/05/2022 -  Chemotherapy   Patient is on Treatment Plan : PANCREATIC Abraxane D1,15 + Gemcitabine D1,15 q28d        INTERVAL HISTORY:   Meagan [name] is a 88 y.o. female presenting to clinic today for follow up of pancreatic adenocarcinoma . She was last seen by me on 07/02/2022.  Today, she states that she is doing well overall. Her appetite level is at 100%. Her energy level is at 100%. She has a good appetite and had gained weight since her last visit. She denies  numbing and.tenderness in the feet.  She denies any falls.  She is able to do all her household activities.  PAST MEDICAL HISTORY:   Past Medical History: Past Medical History:  Diagnosis Date   Essential tremor    GERD (gastroesophageal reflux disease)    High cholesterol    Hypertension    PONV (postoperative nausea and vomiting)    Port-A-Cath in place 01/27/2022   Vertigo     Surgical History: Past Surgical History:  Procedure Laterality Date   BILIARY BRUSHING  09/10/2021   Procedure: BILIARY BRUSHING;  Surgeon: Irene Shipper, MD;  Location: [place];  Service: Gastroenterology;;   BILIARY BRUSHING  10/29/2021   Procedure: BILIARY BRUSHING;  Surgeon: Irving Copas., MD;  Location: [facility];  Service: Gastroenterology;;   BILIARY DILATION  10/29/2021   Procedure: BILIARY DILATION;  Surgeon: Irving Copas., MD;  Location: [facility];  Service: Gastroenterology;;   BILIARY STENT PLACEMENT  09/10/2021   Procedure: BILIARY STENT PLACEMENT;  Surgeon: Irene Shipper, MD;  Location[facility];  Service: Gastroenterology;;   BILIARY STENT PLACEMENT N/A 10/03/2021   Procedure: BILIARY STENT PLACEMENT;  Surgeon: Gatha Mayer, MD;  Location: [facility];  Service: Gastroenterology;  Laterality: N/A;   BILIARY STENT PLACEMENT N/A 10/29/2021   Procedure: BILIARY STENT PLACEMENT;  Surgeon: Rush Landmark Telford Nab., MD;  Location: [facility];  Service: Gastroenterology;  Laterality: N/A;   BILIARY STENT PLACEMENT N/A 05/08/2022   Procedure: BILIARY STENT PLACEMENT;  Surgeon: Gatha Mayer, MD;  Location: [facility];  Service: Gastroenterology;  Laterality: N/A;   BIOPSY  10/29/2021   Procedure: BIOPSY;  Surgeon: Rush Landmark Telford Nab., MD;  Location: [facility];  Service: Gastroenterology;;   ENDOSCOPIC RETROGRADE CHOLANGIOPANCREATOGRAPHY (ERCP) WITH PROPOFOL N/A 10/29/2021   Procedure: ENDOSCOPIC RETROGRADE CHOLANGIOPANCREATOGRAPHY (ERCP) WITH PROPOFOL;   Surgeon: Irving Copas., MD;  Location: [facility];  Service: Gastroenterology;  Laterality: N/A;   ERCP N/A 09/10/2021   Procedure: ENDOSCOPIC RETROGRADE CHOLANGIOPANCREATOGRAPHY (ERCP);  Surgeon: Irene Shipper, MD;  Location: [place];  Service: Gastroenterology;  Laterality: N/A;   ERCP N/A 10/03/2021   Procedure: ENDOSCOPIC RETROGRADE CHOLANGIOPANCREATOGRAPHY (ERCP);  Surgeon: Gatha Mayer, MD;  Location: [facility];  Service: Gastroenterology;  Laterality: N/A;   ERCP N/A 05/08/2022   Procedure: ENDOSCOPIC RETROGRADE CHOLANGIOPANCREATOGRAPHY (ERCP);  Surgeon: Gatha Mayer, MD;  Location: [facility];  Service: Gastroenterology;  Laterality: N/A;   ESOPHAGOGASTRODUODENOSCOPY (EGD) WITH PROPOFOL N/A 10/29/2021   Procedure: ESOPHAGOGASTRODUODENOSCOPY (EGD) WITH PROPOFOL;  Surgeon: Rush Landmark Telford Nab., MD;  Location: [facility];  Service: Gastroenterology;  Laterality: N/A;   EUS N/A 10/29/2021   Procedure: UPPER ENDOSCOPIC ULTRASOUND (EUS) RADIAL;  Surgeon: Irving Copas., MD;  Location: [facility];  Service: Gastroenterology;  Laterality: N/A;   FINE NEEDLE ASPIRATION  10/29/2021   Procedure: FINE NEEDLE ASPIRATION (FNA) LINEAR;  Surgeon: Irving Copas., MD;  Location: [facility];  Service: Gastroenterology;;   IR IMAGING GUIDED PORT INSERTION  01/28/2022   PACEMAKER IMPLANT     REMOVAL OF STONES  10/29/2021   Procedure: REMOVAL OF SLUDGE;  Surgeon: Irving Copas., MD;  Location: [facility];  Service: Gastroenterology;;   Joan Mayans  09/10/2021   Procedure: Joan Mayans;  Surgeon: Irene Shipper, MD;  Location[facility];  Service: Gastroenterology;;   Bess Kinds CHOLANGIOSCOPY N/A 10/29/2021   Procedure: [id number] CHOLANGIOSCOPY;  Surgeon: Irving Copas., MD;  Location: [facility];  Service: Gastroenterology;  Laterality: N/A;   STENT REMOVAL  10/03/2021   Procedure: STENT REMOVAL;  Surgeon: Gatha Mayer, MD;  Location:  [facility];  Service: Gastroenterology;;    Social History: Social History   Socioeconomic History   Marital status: Widowed    Spouse name: Not on file   Number of children: Not on file   Years of education: Not on file   Highest education level: Not on file  Occupational History   Not on file  Tobacco Use   Smoking status: Never   Smokeless tobacco: Never  Vaping Use   Vaping Use: Never used  Substance and Sexual Activity   Alcohol use: Never   Drug use: Never   Sexual activity: Not on file  Other Topics Concern   Not on file  Social History Narrative   Not on file   Social Determinants of Health   Financial Resource Strain: Not on file  Food Insecurity: No Food Insecurity (05/07/2022)   Hunger Vital Sign    Worried About Running Out of Food in the Last Year: Never true    Ran Out of Food in the Last Year: Never true  Transportation Needs: No Transportation Needs (05/07/2022)   PRAPARE - Transportation    Lack  of Transportation (Medical): No    Lack of Transportation (Non-Medical): No  Physical Activity: Not on file  Stress: Not on file  Social Connections: Not on file  Intimate Partner Violence: Not At Risk (05/07/2022)   Humiliation, Afraid, Rape, and Kick questionnaire    Fear of Current or Ex-Partner: No    Emotionally Abused: No    Physically Abused: No    Sexually Abused: No    Family History: Family History  Problem Relation Age of Onset   Stomach cancer Neg Hx    Esophageal cancer Neg Hx    Colon cancer Neg Hx    Pancreatic cancer Neg Hx     Current Medications:  Current Outpatient Medications:    amLODipine (NORVASC) 2.5 MG tablet, Take 2.5 mg by mouth at bedtime., Disp: , Rfl:    aspirin EC 81 MG tablet, Take 81 mg by mouth daily. Swallow whole., Disp: , Rfl:    Cholecalciferol (VITAMIN D3) 50 MCG (2000 UT) TABS, Take 2,000 Units by mouth daily after supper., Disp: , Rfl:    Coenzyme Q10 (CO Q-10) 100 MG CAPS, Take 100 mg by mouth every  evening., Disp: , Rfl:    famotidine (PEPCID) 40 MG tablet, Take 40 mg by mouth every evening., Disp: , Rfl:    gabapentin (NEURONTIN) 100 MG capsule, Take 100 mg by mouth 2 (two) times daily., Disp: , Rfl:    lidocaine-prilocaine (EMLA) cream, Apply a small amount to port a cath site (do not rub in) and cover with plastic wrap 1 hour prior to infusion appointments, Disp: 30 g, Rfl: 3   meclizine (ANTIVERT) 25 MG tablet, Take 25 mg by mouth daily as needed for dizziness., Disp: , Rfl:    Misc Natural Products (JOINT SUPPORT) CAPS, Take 1 capsule by mouth daily with breakfast., Disp: , Rfl:    Multiple Vitamin (MULTIVITAMIN) tablet, Take 1 tablet by mouth daily with breakfast., Disp: , Rfl:    Multiple Vitamins-Minerals (OCUVITE EYE HEALTH FORMULA) CAPS, Take 1 capsule by mouth daily., Disp: , Rfl:    nebivolol (BYSTOLIC) 5 MG tablet, Take 2.5 mg by mouth in the morning., Disp: , Rfl:    omeprazole (PRILOSEC) 40 MG capsule, Take 40 mg by mouth daily before breakfast., Disp: , Rfl:    polyethylene glycol (MIRALAX) 17 g packet, Take 17 g by mouth daily as needed. (Patient taking differently: Take 17 g by mouth daily as needed for moderate constipation.), Disp: 14 each, Rfl: 0   Polyvinyl Alcohol-Povidone (REFRESH OP), Place 1 drop into both eyes daily as needed (tired eyes)., Disp: , Rfl:    predniSONE (DELTASONE) 50 MG tablet, Take 13 hours, 7 hours and 1 hour prior to CT scan, Disp: 3 tablet, Rfl: 0   primidone (MYSOLINE) 50 MG tablet, Take 25 mg by mouth daily as needed (for tremors)., Disp: , Rfl:    prochlorperazine (COMPAZINE) 10 MG tablet, Take 1 tablet (10 mg total) by mouth every 6 (six) hours as needed for nausea or vomiting., Disp: 60 tablet, Rfl: 6   pyridOXINE (VITAMIN B-6) 100 MG tablet, Take 50 mg by mouth in the morning., Disp: , Rfl:    tiZANidine (ZANAFLEX) 2 MG tablet, Take 1 mg by mouth daily as needed for muscle spasms., Disp: , Rfl:    diphenhydrAMINE (BENADRYL) 50 MG tablet, Take  1 tablet (50 mg total) by mouth once for 1 dose. Take 1 hour prior to CT scan, Disp: 1 tablet, Rfl: 0   Allergies: Allergies  Allergen Reactions   Ivp Dye [Iodinated Contrast Media] Hives   Lisinopril Cough    REVIEW OF SYSTEMS:   Review of Systems  Constitutional:  Negative for chills, fatigue and fever.  HENT:   Negative for lump/mass, mouth sores, nosebleeds, sore throat and trouble swallowing.   Eyes:  Negative for eye problems.  Respiratory:  Negative for cough and shortness of breath.   Cardiovascular:  Negative for chest pain, leg swelling and palpitations.  Gastrointestinal:  Negative for abdominal pain, constipation, diarrhea, nausea and vomiting.  Genitourinary:  Negative for bladder incontinence, difficulty urinating, dysuria, frequency, hematuria and nocturia.   Musculoskeletal:  Negative for arthralgias [facility] maker), back pain, flank pain, myalgias and neck pain.  Skin:  Negative for itching and rash.  Neurological:  Negative for dizziness, headaches and numbness.  Hematological:  Does not bruise/bleed easily.  Psychiatric/Behavioral:  Negative for depression, sleep disturbance and suicidal ideas. The patient is not nervous/anxious.   All other systems reviewed and are negative.    VITALS:   There were no vitals taken for this visit.  Wt Readings from Last 3 Encounters:  07/15/22 117 lb 6.4 oz (53.3 kg)  07/02/22 118 lb 6.4 oz (53.7 kg)  06/18/22 114 lb 11.2 oz (52 kg)    There is no height or weight on file to calculate BMI.  Performance status (ECOG): 1 - Symptomatic but completely ambulatory  PHYSICAL EXAM:   Physical Exam Vitals and nursing note reviewed. Exam conducted with a chaperone present.  Constitutional:      Appearance: Normal appearance.  Cardiovascular:     Rate and Rhythm: Normal rate and regular rhythm.     Pulses: Normal pulses.     Heart sounds: Normal heart sounds.  Pulmonary:     Effort: Pulmonary effort is normal.     Breath  sounds: Normal breath sounds.  Abdominal:     Palpations: Abdomen is soft. There is no hepatomegaly, splenomegaly or mass.     Tenderness: There is no abdominal tenderness.  Musculoskeletal:     Right lower leg: No edema.     Left lower leg: No edema.  Lymphadenopathy:     Cervical: No cervical adenopathy.     Right cervical: No superficial, deep or posterior cervical adenopathy.    Left cervical: No superficial, deep or posterior cervical adenopathy.     Upper Body:     Right upper body: No supraclavicular or axillary adenopathy.     Left upper body: No supraclavicular or axillary adenopathy.  Neurological:     General: No focal deficit present.     Mental Status: She is alert and oriented to person, place, and time.  Psychiatric:        Mood and Affect: Mood normal.        Behavior: Behavior normal.     LABS:      Latest Ref Rng & Units 07/15/2022    9:17 AM 07/02/2022   10:10 AM 06/18/2022   11:55 AM  CBC  WBC 4.0 - 10.5 K/uL 5.9  7.2  6.7   Hemoglobin 12.0 - 15.0 g/dL 9.8  9.6  9.9   Hematocrit 36.0 - 46.0 % 30.8  30.8  31.3   Platelets 150 - 400 K/uL 257  259  267       Latest Ref Rng & Units 07/15/2022    9:17 AM 07/02/2022   10:10 AM 06/18/2022   11:55 AM  CMP  Glucose 70 - 99 mg/dL 105  86  107   BUN 8 - 23 mg/dL 30  31  32   Creatinine 0.44 - 1.00 mg/dL 1.02  1.05  1.00   Sodium 135 - 145 mmol/L 137  136  134   Potassium 3.5 - 5.1 mmol/L 4.5  4.4  4.7   Chloride 98 - 111 mmol/L 105  103  101   CO2 22 - 32 mmol/L 24  24  23   $ Calcium 8.9 - 10.3 mg/dL 9.7  10.0  9.7   Total Protein 6.5 - 8.1 g/dL 6.4  6.6  6.9   Total Bilirubin 0.3 - 1.2 mg/dL 0.5  0.6  0.8   Alkaline Phos 38 - 126 U/L 148  243  192   AST 15 - 41 U/L 31  57  36   ALT 0 - 44 U/L 43  81  41      No results found for: "CEA1", "CEA" / No results found for: "CEA1", "CEA" No results found for: "PSA1" Lab Results  Component Value Date   [id number] (H) 07/02/2022   No results found for: "[id number]"   No results found for: "TOTALPROTELP", "ALBUMINELP", "A1GS", "A2GS", "BETS", "BETA2SER", "GAMS", "MSPIKE", "SPEI" Lab Results  Component Value Date   TIBC 438 06/18/2022   FERRITIN 32 06/18/2022   IRONPCTSAT 13 06/18/2022   No results found for: "LDH"   STUDIES:   CT Chest W Contrast  Result Date: 07/10/2022 CLINICAL DATA:  Pancreatic cancer restaging. EXAM: CT CHEST, ABDOMEN, AND PELVIS WITH CONTRAST TECHNIQUE: Multidetector CT imaging of the chest, abdomen and pelvis was performed following the standard protocol during bolus administration of intravenous contrast. RADIATION DOSE REDUCTION: This exam was performed according to the departmental dose-optimization program which includes automated exposure control, adjustment of the mA and/or kV according to patient size and/or use of iterative reconstruction technique. CONTRAST:  127m OMNIPAQUE IOHEXOL 300 MG/ML  SOLN COMPARISON:  05/06/2022 FINDINGS: CT CHEST FINDINGS Cardiovascular: The heart size is normal. No substantial pericardial effusion. Mild atherosclerotic calcification is noted in the wall of the thoracic aorta. Right Port-A-Cath tip is positioned in the right atrium. Left permanent pacemaker noted. Mediastinum/Nodes: No mediastinal lymphadenopathy. There is no hilar lymphadenopathy. The esophagus has normal imaging features. There is no axillary lymphadenopathy. Lungs/Pleura: Biapical pleuroparenchymal scarring. 4 mm left lower lobe nodule on 104/13 is stable since chest CT 11/10/2021. Tiny peripheral right middle lobe nodules on 96 and 97 of series 13 are also stable since that prior study. No new suspicious pulmonary nodule or mass. No focal airspace consolidation. No pleural effusion. Musculoskeletal: No worrisome lytic or sclerotic osseous abnormality. CT ABDOMEN PELVIS FINDINGS Hepatobiliary: Tiny hypodensity in the lateral segment left liver is stable, likely benign. Additional scattered very tiny hypodensities in both hepatic lobes  are too small to characterize but stable. Pneumobilia is compatible with the presence of a common bile duct stent. Gallbladder is decompressed which likely accentuates wall thickness, similar to prior. Pancreas: Ill-defined pancreatic head mass is similar to slightly larger today measuring 3.2 x 2.4 cm compared to 2.9 x 1.8 cm previously. There was some preservation of fat around the SMA previously with abutment over about 180 degree and this appears slightly progressive in the interval with soft tissue surrounding the SMA by at least 270 degrees today (image 53/4). Portal vein, superior mesenteric vein, and splenic vein remain patent. The celiac axis and the SMA are patent. Spleen: No splenomegaly. No focal mass lesion. Adrenals/Urinary Tract: No adrenal nodule or mass. Bilateral cortical scarring  noted with scattered tiny hypodensities in both kidneys much too small to characterize. No followup imaging is recommended. Exophytic cyst lower pole right kidney has simple features, stable in the interval. No followup imaging is recommended. No evidence for hydroureter. The urinary bladder appears normal for the degree of distention. Stomach/Bowel: Stomach is moderately distended with fluid. Duodenum is normally positioned as is the ligament of Treitz. No small bowel wall thickening. No small bowel dilatation. Fecalization of enteric contents noted distal small bowel suspicious for decreased small bowel transit time. Prominent stool volume noted throughout the colon. Volume throughout the colon imaging features Vascular/Lymphatic: There is mild atherosclerotic calcification of the abdominal aorta without aneurysm. There is no gastrohepatic or hepatoduodenal ligament lymphadenopathy. No retroperitoneal or mesenteric lymphadenopathy. No pelvic sidewall lymphadenopathy. Reproductive: The uterus is unremarkable.  There is no adnexal mass. Other: No intraperitoneal free fluid. Musculoskeletal: No worrisome lytic or sclerotic  osseous abnormality. IMPRESSION: 1. Ill-defined pancreatic head mass is similar to slightly larger today measuring 3.2 x 2.4 cm compared to 2.9 x 1.8 cm previously. There was SMA abutment over about 180 degree previously and this appears slightly progressive in the interval with soft tissue surrounding the SMA by at least 270 degrees today. 2. No evidence for distant metastatic disease in the chest, abdomen, or pelvis. 3. Stable tiny bilateral pulmonary nodules, likely benign. 4. Prominent stool volume throughout the colon. Imaging features could be compatible with constipation in the appropriate clinical setting. 5.  Aortic Atherosclerosis (ICD10-I70.0). Electronically Signed   By: Misty Stanley M.D.   On: 07/10/2022 10:24   CT ABDOMEN PELVIS W WO CONTRAST  Result Date: 07/10/2022 CLINICAL DATA:  Pancreatic cancer restaging. EXAM: CT CHEST, ABDOMEN, AND PELVIS WITH CONTRAST TECHNIQUE: Multidetector CT imaging of the chest, abdomen and pelvis was performed following the standard protocol during bolus administration of intravenous contrast. RADIATION DOSE REDUCTION: This exam was performed according to the departmental dose-optimization program which includes automated exposure control, adjustment of the mA and/or kV according to patient size and/or use of iterative reconstruction technique. CONTRAST:  128m OMNIPAQUE IOHEXOL 300 MG/ML  SOLN COMPARISON:  05/06/2022 FINDINGS: CT CHEST FINDINGS Cardiovascular: The heart size is normal. No substantial pericardial effusion. Mild atherosclerotic calcification is noted in the wall of the thoracic aorta. Right Port-A-Cath tip is positioned in the right atrium. Left permanent pacemaker noted. Mediastinum/Nodes: No mediastinal lymphadenopathy. There is no hilar lymphadenopathy. The esophagus has normal imaging features. There is no axillary lymphadenopathy. Lungs/Pleura: Biapical pleuroparenchymal scarring. 4 mm left lower lobe nodule on 104/13 is stable since chest CT  11/10/2021. Tiny peripheral right middle lobe nodules on 96 and 97 of series 13 are also stable since that prior study. No new suspicious pulmonary nodule or mass. No focal airspace consolidation. No pleural effusion. Musculoskeletal: No worrisome lytic or sclerotic osseous abnormality. CT ABDOMEN PELVIS FINDINGS Hepatobiliary: Tiny hypodensity in the lateral segment left liver is stable, likely benign. Additional scattered very tiny hypodensities in both hepatic lobes are too small to characterize but stable. Pneumobilia is compatible with the presence of a common bile duct stent. Gallbladder is decompressed which likely accentuates wall thickness, similar to prior. Pancreas: Ill-defined pancreatic head mass is similar to slightly larger today measuring 3.2 x 2.4 cm compared to 2.9 x 1.8 cm previously. There was some preservation of fat around the SMA previously with abutment over about 180 degree and this appears slightly progressive in the interval with soft tissue surrounding the SMA by at least 270 degrees today (image  53/4). Portal vein, superior mesenteric vein, and splenic vein remain patent. The celiac axis and the SMA are patent. Spleen: No splenomegaly. No focal mass lesion. Adrenals/Urinary Tract: No adrenal nodule or mass. Bilateral cortical scarring noted with scattered tiny hypodensities in both kidneys much too small to characterize. No followup imaging is recommended. Exophytic cyst lower pole right kidney has simple features, stable in the interval. No followup imaging is recommended. No evidence for hydroureter. The urinary bladder appears normal for the degree of distention. Stomach/Bowel: Stomach is moderately distended with fluid. Duodenum is normally positioned as is the ligament of Treitz. No small bowel wall thickening. No small bowel dilatation. Fecalization of enteric contents noted distal small bowel suspicious for decreased small bowel transit time. Prominent stool volume noted  throughout the colon. Volume throughout the colon imaging features Vascular/Lymphatic: There is mild atherosclerotic calcification of the abdominal aorta without aneurysm. There is no gastrohepatic or hepatoduodenal ligament lymphadenopathy. No retroperitoneal or mesenteric lymphadenopathy. No pelvic sidewall lymphadenopathy. Reproductive: The uterus is unremarkable.  There is no adnexal mass. Other: No intraperitoneal free fluid. Musculoskeletal: No worrisome lytic or sclerotic osseous abnormality. IMPRESSION: 1. Ill-defined pancreatic head mass is similar to slightly larger today measuring 3.2 x 2.4 cm compared to 2.9 x 1.8 cm previously. There was SMA abutment over about 180 degree previously and this appears slightly progressive in the interval with soft tissue surrounding the SMA by at least 270 degrees today. 2. No evidence for distant metastatic disease in the chest, abdomen, or pelvis. 3. Stable tiny bilateral pulmonary nodules, likely benign. 4. Prominent stool volume throughout the colon. Imaging features could be compatible with constipation in the appropriate clinical setting. 5.  Aortic Atherosclerosis (ICD10-I70.0). Electronically Signed   By: Misty Stanley M.D.   On: 07/10/2022 10:24

## 2022-07-15 NOTE — Progress Notes (Signed)
Patient presents today for Venofer Gemzar and Abraxane infusions per providers order.  Vital signs within parameters for treatment.  Labs pending.    Proceeding with Venofer prior to Chemotherapy per providers instruction.  Message received from Anastasio Champion RN/Dr. Delton Coombes, no treatment today due to progression, will return in 2 weeks for new treatment plan.  Stable during infusion without adverse affects.  Vital signs stable.  No complaints at this time.  Discharge from clinic ambulatory in stable condition.  Alert and oriented X 3.  Follow up with [facility] as scheduled.

## 2022-07-15 NOTE — Patient Instructions (Signed)
[place]  Discharge Instructions: Thank you for choosing [facility] to provide your oncology and hematology care.  If you have a lab appointment with the [facility], please come in thru the Main Entrance and check in at the main information desk.  Wear comfortable clothing and clothing appropriate for easy access to any Portacath or PICC line.   We strive to give you quality time with your provider. You may need to reschedule your appointment if you arrive late (15 or more minutes).  Arriving late affects you and other patients whose appointments are after yours.  Also, if you miss three or more appointments without notifying the office, you may be dismissed from the clinic at the provider's discretion.      For prescription refill requests, have your pharmacy contact our office and allow 72 hours for refills to be completed.    Today you received the following chemotherapy and/or immunotherapy agents Venofer      To help prevent nausea and vomiting after your treatment, we encourage you to take your nausea medication as directed.  BELOW ARE SYMPTOMS THAT SHOULD BE REPORTED IMMEDIATELY: *FEVER GREATER THAN 100.4 F (38 C) OR HIGHER *CHILLS OR SWEATING *NAUSEA AND VOMITING THAT IS NOT CONTROLLED WITH YOUR NAUSEA MEDICATION *UNUSUAL SHORTNESS OF BREATH *UNUSUAL BRUISING OR BLEEDING *URINARY PROBLEMS (pain or burning when urinating, or frequent urination) *BOWEL PROBLEMS (unusual diarrhea, constipation, pain near the anus) TENDERNESS IN MOUTH AND THROAT WITH OR WITHOUT PRESENCE OF ULCERS (sore throat, sores in mouth, or a toothache) UNUSUAL RASH, SWELLING OR PAIN  UNUSUAL VAGINAL DISCHARGE OR ITCHING   Items with * indicate a potential emergency and should be followed up as soon as possible or go to the Emergency Department if any problems should occur.  Please show the CHEMOTHERAPY ALERT CARD or IMMUNOTHERAPY ALERT CARD at check-in to the Emergency  Department and triage nurse.  Should you have questions after your visit or need to cancel or reschedule your appointment, please contact [facility] [phone]  and follow the prompts.  Office hours are 8:00 a.m. to 4:30 p.m. Monday - Friday. Please note that voicemails left after 4:00 p.m. may not be returned until the following business day.  We are closed weekends and major holidays. You have access to a nurse at all times for urgent questions. Please call the main number to the clinic [phone] and follow the prompts.  For any non-urgent questions, you may also contact your provider using MyChart. We now offer e-Visits for anyone 73 and older to request care online for non-urgent symptoms. For details visit [email or web address].   Also download the MyChart app! Go to the app store, search "MyChart", open the app, select [place], and log in with your MyChart username and password.

## 2022-07-15 NOTE — Patient Instructions (Addendum)
[place] at [facility] Discharge Instructions   You were seen and examined today by Dr. Delton Coombes.  He reviewed the results of your CT scan. It is showing the spot on the pancreas has slightly increased in size. This means that the treatment is not helping and we need to switch treatments. The good news is there are no new spots of cancer anywhere else in the body.   He discussed with you pursuing treatment with a different chemotherapy for several treatments followed by radiation therapy to keep the cancer under control.   He reviewed the results of your lab work which are normal/stable.      Thank you for choosing [place] at [facility] to provide your oncology and hematology care.  To afford each patient quality time with our provider, please arrive at least 15 minutes before your scheduled appointment time.   If you have a lab appointment with the [facility] please come in thru the Main Entrance and check in at the main information desk.  You need to re-schedule your appointment should you arrive 10 or more minutes late.  We strive to give you quality time with our providers, and arriving late affects you and other patients whose appointments are after yours.  Also, if you no show three or more times for appointments you may be dismissed from the clinic at the providers discretion.     Again, thank you for choosing [facility].  Our hope is that these requests will decrease the amount of time that you wait before being seen by our physicians.       _____________________________________________________________  Should you have questions after your visit to [facility], please contact our office at [phone] and follow the prompts.  Our office hours are 8:00 a.m. and 4:30 p.m. Monday - Friday.  Please note that voicemails left after 4:00 p.m. may not be returned until the following business day.  We are  closed weekends and major holidays.  You do have access to a nurse 24-7, just call the main number to the clinic [phone] and do not press any options, hold on the line and a nurse will answer the phone.    For prescription refill requests, have your pharmacy contact our office and allow 72 hours.    Due to Covid, you will need to wear a mask upon entering the hospital. If you do not have a mask, a mask will be given to you at the Main Entrance upon arrival. For doctor visits, patients may have 1 support person age 51 or older with them. For treatment visits, patients can not have anyone with them due to social distancing guidelines and our immunocompromised population.

## 2022-07-16 ENCOUNTER — Other Ambulatory Visit: Payer: Self-pay

## 2022-07-24 ENCOUNTER — Other Ambulatory Visit: Payer: Self-pay

## 2022-07-29 ENCOUNTER — Ambulatory Visit: Payer: Medicare HMO

## 2022-07-29 ENCOUNTER — Ambulatory Visit: Payer: Medicare HMO | Admitting: Hematology

## 2022-07-29 ENCOUNTER — Other Ambulatory Visit: Payer: Medicare HMO

## 2022-07-31 ENCOUNTER — Inpatient Hospital Stay: Payer: Medicare HMO | Attending: Hematology

## 2022-07-31 VITALS — BP 105/90 | HR 68 | Temp 98.9°F | Resp 18

## 2022-07-31 DIAGNOSIS — Z8041 Family history of malignant neoplasm of ovary: Secondary | ICD-10-CM | POA: Diagnosis not present

## 2022-07-31 DIAGNOSIS — I10 Essential (primary) hypertension: Secondary | ICD-10-CM | POA: Insufficient documentation

## 2022-07-31 DIAGNOSIS — D649 Anemia, unspecified: Secondary | ICD-10-CM | POA: Diagnosis not present

## 2022-07-31 DIAGNOSIS — C250 Malignant neoplasm of head of pancreas: Secondary | ICD-10-CM | POA: Diagnosis present

## 2022-07-31 DIAGNOSIS — Z803 Family history of malignant neoplasm of breast: Secondary | ICD-10-CM | POA: Insufficient documentation

## 2022-07-31 DIAGNOSIS — D509 Iron deficiency anemia, unspecified: Secondary | ICD-10-CM

## 2022-07-31 MED ORDER — CETIRIZINE HCL 10 MG PO TABS
10.0000 mg | ORAL_TABLET | Freq: Once | ORAL | Status: AC
  Administered 2022-07-31: 10 mg via ORAL
  Filled 2022-07-31: qty 1

## 2022-07-31 MED ORDER — ACETAMINOPHEN 325 MG PO TABS
650.0000 mg | ORAL_TABLET | Freq: Once | ORAL | Status: AC
  Administered 2022-07-31: 650 mg via ORAL
  Filled 2022-07-31: qty 2

## 2022-07-31 MED ORDER — SODIUM CHLORIDE 0.9% FLUSH
10.0000 mL | INTRAVENOUS | Status: DC | PRN
  Administered 2022-07-31: 10 mL

## 2022-07-31 MED ORDER — SODIUM CHLORIDE 0.9 % IV SOLN: Freq: Once | INTRAVENOUS | Status: AC

## 2022-07-31 MED ORDER — SODIUM CHLORIDE 0.9 % IV SOLN
400.0000 mg | Freq: Once | INTRAVENOUS | Status: AC
  Administered 2022-07-31: 400 mg via INTRAVENOUS
  Filled 2022-07-31: qty 20

## 2022-07-31 MED ORDER — HEPARIN SOD (PORK) LOCK FLUSH 100 UNIT/ML IV SOLN
500.0000 [IU] | Freq: Once | INTRAVENOUS | Status: AC
  Administered 2022-07-31: 500 [IU] via INTRAVENOUS

## 2022-07-31 NOTE — Progress Notes (Signed)
Chaplain engaged in an initial visit with Kellene and her daughter. Sharalyn shared about her healthcare journey, as well as her journey with grief over the last year.  Shanean detailed losing her husband, sisters, and other significant family members. She also has lost a daughter within the last 6 years. Ave's health journey and experience with death have left her with a peacefulness around when it may be her time to transition. She shared about being the only living person in her respective family now. She also discussed the ways in which she dearly misses her husband who she was married to for 74 years.   Chaplain provided space for reflection and upholding her grief, while affirming Quinn's conclusions around her medical care. She finds herself of wanting to do medical interventions but is ok if she comes to a place of being unable to do them. She voiced that she has lived a good life.   Chaplain assessed Noah's recognition of how life has significantly changed for her. From having lower energy to needing someone to help her drive now, Clatie sees her life, living, and transitioning in a new light.   Chaplain offered compassionate and supportive presence that gave way for sharing, reflection, and community.   Chaplain Andreina Outten, MDiv  07/31/22 1100  Spiritual Encounters  Type of Visit Initial  Care provided to: Pt and family  Spiritual Framework  Presenting Themes Community and relationships;Impactful experiences and emotions;Significant life change;Goals in life/care  Community/Connection Family  Patient Stress Factors Health changes;Major life changes  Interventions  Spiritual Care Interventions Made Compassionate presence;Established relationship of care and support;Reflective listening;Narrative/life review  Intervention Outcomes  Outcomes Connection to spiritual care;Awareness of support

## 2022-07-31 NOTE — Progress Notes (Signed)
Pt presents today for Venofer IV iron per provider's order. Vital signs stable and pt voiced no new complaints at this time.  Venofer 400 mg given today per MD orders. Tolerated infusion without adverse affects. Vital signs stable. No complaints at this time. Discharged from clinic ambulatory in stable condition. Alert and oriented x 3. F/U with [facility] as scheduled.

## 2022-07-31 NOTE — Patient Instructions (Signed)
[place]  Discharge Instructions: Thank you for choosing [facility] to provide your oncology and hematology care.  If you have a lab appointment with the [facility], please come in thru the Main Entrance and check in at the main information desk.  Wear comfortable clothing and clothing appropriate for easy access to any Portacath or PICC line.   We strive to give you quality time with your provider. You may need to reschedule your appointment if you arrive late (15 or more minutes).  Arriving late affects you and other patients whose appointments are after yours.  Also, if you miss three or more appointments without notifying the office, you may be dismissed from the clinic at the provider's discretion.      For prescription refill requests, have your pharmacy contact our office and allow 72 hours for refills to be completed.    Today you received Venofer IV iron infusion   BELOW ARE SYMPTOMS THAT SHOULD BE REPORTED IMMEDIATELY: *FEVER GREATER THAN 100.4 F (38 C) OR HIGHER *CHILLS OR SWEATING *NAUSEA AND VOMITING THAT IS NOT CONTROLLED WITH YOUR NAUSEA MEDICATION *UNUSUAL SHORTNESS OF BREATH *UNUSUAL BRUISING OR BLEEDING *URINARY PROBLEMS (pain or burning when urinating, or frequent urination) *BOWEL PROBLEMS (unusual diarrhea, constipation, pain near the anus) TENDERNESS IN MOUTH AND THROAT WITH OR WITHOUT PRESENCE OF ULCERS (sore throat, sores in mouth, or a toothache) UNUSUAL RASH, SWELLING OR PAIN  UNUSUAL VAGINAL DISCHARGE OR ITCHING   Items with * indicate a potential emergency and should be followed up as soon as possible or go to the Emergency Department if any problems should occur.  Please show the CHEMOTHERAPY ALERT CARD or IMMUNOTHERAPY ALERT CARD at check-in to the Emergency Department and triage nurse.  Should you have questions after your visit or need to cancel or reschedule your appointment, please contact [facility] [phone]  and follow the prompts.  Office hours are 8:00 a.m. to 4:30 p.m. Monday - Friday. Please note that voicemails left after 4:00 p.m. may not be returned until the following business day.  We are closed weekends and major holidays. You have access to a nurse at all times for urgent questions. Please call the main number to the clinic [phone] and follow the prompts.  For any non-urgent questions, you may also contact your provider using MyChart. We now offer e-Visits for anyone 47 and older to request care online for non-urgent symptoms. For details visit [email or web address].   Also download the MyChart app! Go to the app store, search "MyChart", open the app, select [place] [facility], and log in with your MyChart username and password.

## 2022-08-03 ENCOUNTER — Ambulatory Visit: Payer: Medicare HMO

## 2022-08-12 ENCOUNTER — Inpatient Hospital Stay: Payer: Medicare HMO

## 2022-08-12 ENCOUNTER — Inpatient Hospital Stay: Payer: Medicare HMO | Admitting: Hematology

## 2022-08-26 ENCOUNTER — Inpatient Hospital Stay: Payer: Medicare HMO

## 2022-08-26 ENCOUNTER — Inpatient Hospital Stay (HOSPITAL_BASED_OUTPATIENT_CLINIC_OR_DEPARTMENT_OTHER): Payer: Medicare HMO | Admitting: Hematology

## 2022-08-26 ENCOUNTER — Encounter: Payer: Self-pay | Admitting: Hematology

## 2022-08-26 VITALS — BP 171/82 | HR 85 | Temp 96.6°F | Resp 18 | Ht 65.0 in | Wt 117.6 lb

## 2022-08-26 DIAGNOSIS — C259 Malignant neoplasm of pancreas, unspecified: Secondary | ICD-10-CM

## 2022-08-26 DIAGNOSIS — Z95828 Presence of other vascular implants and grafts: Secondary | ICD-10-CM | POA: Diagnosis not present

## 2022-08-26 DIAGNOSIS — C250 Malignant neoplasm of head of pancreas: Secondary | ICD-10-CM | POA: Diagnosis not present

## 2022-08-26 LAB — COMPREHENSIVE METABOLIC PANEL
ALT: 74 U/L — ABNORMAL HIGH (ref 0–44)
AST: 48 U/L — ABNORMAL HIGH (ref 15–41)
Albumin: 3.5 g/dL (ref 3.5–5.0)
Alkaline Phosphatase: 252 U/L — ABNORMAL HIGH (ref 38–126)
Anion gap: 6 (ref 5–15)
BUN: 29 mg/dL — ABNORMAL HIGH (ref 8–23)
CO2: 25 mmol/L (ref 22–32)
Calcium: 9.4 mg/dL (ref 8.9–10.3)
Chloride: 104 mmol/L (ref 98–111)
Creatinine, Ser: 0.97 mg/dL (ref 0.44–1.00)
GFR, Estimated: 57 mL/min — ABNORMAL LOW (ref >60)
Glucose, Bld: 112 mg/dL — ABNORMAL HIGH (ref 70–99)
Potassium: 4.4 mmol/L (ref 3.5–5.1)
Sodium: 135 mmol/L (ref 135–145)
Total Bilirubin: 0.6 mg/dL (ref 0.3–1.2)
Total Protein: 6.4 g/dL — ABNORMAL LOW (ref 6.5–8.1)

## 2022-08-26 LAB — CBC WITH DIFFERENTIAL/PLATELET
Abs Immature Granulocytes: 0.01 10*3/uL (ref 0.00–0.07)
Basophils Absolute: 0.1 10*3/uL (ref 0.0–0.1)
Basophils Relative: 1 %
Eosinophils Absolute: 0.2 10*3/uL (ref 0.0–0.5)
Eosinophils Relative: 3 %
HCT: 34.7 % — ABNORMAL LOW (ref 36.0–46.0)
Hemoglobin: 11 g/dL — ABNORMAL LOW (ref 12.0–15.0)
Immature Granulocytes: 0 %
Lymphocytes Relative: 23 %
Lymphs Abs: 1.5 10*3/uL (ref 0.7–4.0)
MCH: 30.4 pg (ref 26.0–34.0)
MCHC: 31.7 g/dL (ref 30.0–36.0)
MCV: 95.9 fL (ref 80.0–100.0)
Monocytes Absolute: 0.7 10*3/uL (ref 0.1–1.0)
Monocytes Relative: 11 %
Neutro Abs: 3.9 10*3/uL (ref 1.7–7.7)
Neutrophils Relative %: 62 %
Platelets: 231 10*3/uL (ref 150–400)
RBC: 3.62 MIL/uL — ABNORMAL LOW (ref 3.87–5.11)
RDW: 18.1 % — ABNORMAL HIGH (ref 11.5–15.5)
WBC: 6.3 10*3/uL (ref 4.0–10.5)
nRBC: 0 % (ref 0.0–0.2)

## 2022-08-26 LAB — MAGNESIUM: Magnesium: 2.1 mg/dL (ref 1.7–2.4)

## 2022-08-26 MED ORDER — HEPARIN SOD (PORK) LOCK FLUSH 100 UNIT/ML IV SOLN
500.0000 [IU] | Freq: Once | INTRAVENOUS | Status: AC
  Administered 2022-08-26: 500 [IU] via INTRAVENOUS

## 2022-08-26 MED ORDER — SODIUM CHLORIDE 0.9% FLUSH
10.0000 mL | INTRAVENOUS | Status: DC | PRN
  Administered 2022-08-26: 10 mL via INTRAVENOUS

## 2022-08-26 NOTE — Progress Notes (Signed)
ON PATHWAY REGIMEN - Pancreatic Adenocarcinoma  No Change  Continue With Treatment as Ordered.  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

## 2022-08-26 NOTE — Progress Notes (Signed)
DISCONTINUE ON PATHWAY REGIMEN - Pancreatic Adenocarcinoma     A cycle is every 28 days:     Nab-paclitaxel (protein bound)      Gemcitabine   **Always confirm dose/schedule in your pharmacy ordering system**  REASON: Continuation Of Treatment PRIOR TREATMENT: [id number]: Nab-Paclitaxel (Abraxane) 125 mg/m2 D1, 8, 15 + Gemcitabine 1,000 mg/m2 D1, 8, 15 q28 Days x 2 Cycles TREATMENT RESPONSE: Progressive Disease (PD)  START ON PATHWAY REGIMEN - Pancreatic Adenocarcinoma     A cycle is every 14 days:     Irinotecan      Oxaliplatin      Leucovorin      Fluorouracil   **Always confirm dose/schedule in your pharmacy ordering system**  Patient Characteristics: Preoperative, M0 (Clinical Staging), Borderline Resectable, PS = 0,1, BRCA1/2 and PALB2 Mutation Absent/Unknown Therapeutic Status: Preoperative, M0 (Clinical Staging) AJCC T Category: cT2 AJCC N Category: cN0 Resectability Status: Borderline Resectable AJCC M Category: cM0 AJCC 8 Stage Grouping: IB ECOG Performance Status: 1 BRCA1/2 Mutation Status: Awaiting Test Results PALB2 Mutation Status: Awaiting Test Results Intent of Therapy: Curative Intent, Discussed with Patient

## 2022-08-26 NOTE — Progress Notes (Signed)
Patients port flushed without difficulty.  Good blood return noted with no bruising or swelling noted at site.  Band aid applied.  VSS with discharge and left in satisfactory condition with no s/s of distress noted.   

## 2022-08-26 NOTE — Patient Instructions (Signed)
[place] at [facility] Discharge Instructions   You were seen and examined today by Dr. Delton Coombes.  He discussed with you treatment options to treat the cancer. The options include taking a chemo pill called Xeloda in conjunction with radiation therapy. Radiation therapy would be 5 days a week for about 6 weeks. You would take the Xeloda pills twice a day, Monday-Friday, only on the days of radiation. The other option would be to treat you in the clinic every 2 weeks with a chemotherapy regimen called FOLFOX. This regimen includes 2 chemotherapy drugs called oxaliplatin and fluorouracil (5FU). There is also a vitamin that is given with this regimen called leucovorin. This helps the 1fu work better. This regimen also requires that you wear an ambulatory pump home for 2 days that slowly infuses the chemotherapy (5FU). You would come to the clinic 2 days after placing the pump to have it removed.   We will refer you to the radiation doctor to get their opinion regarding treatment.   Return as scheduled.    Thank you for choosing [place] at [facility] to provide your oncology and hematology care.  To afford each patient quality time with our provider, please arrive at least 15 minutes before your scheduled appointment time.   If you have a lab appointment with the [facility] please come in thru the Main Entrance and check in at the main information desk.  You need to re-schedule your appointment should you arrive 10 or more minutes late.  We strive to give you quality time with our providers, and arriving late affects you and other patients whose appointments are after yours.  Also, if you no show three or more times for appointments you may be dismissed from the clinic at the providers discretion.     Again, thank you for choosing [facility].  Our hope is that these requests will decrease the amount of time that you wait before  being seen by our physicians.       _____________________________________________________________  Should you have questions after your visit to [facility], please contact our office at [phone] and follow the prompts.  Our office hours are 8:00 a.m. and 4:30 p.m. Monday - Friday.  Please note that voicemails left after 4:00 p.m. may not be returned until the following business day.  We are closed weekends and major holidays.  You do have access to a nurse 24-7, just call the main number to the clinic [phone] and do not press any options, hold on the line and a nurse will answer the phone.    For prescription refill requests, have your pharmacy contact our office and allow 72 hours.    Due to Covid, you will need to wear a mask upon entering the hospital. If you do not have a mask, a mask will be given to you at the Main Entrance upon arrival. For doctor visits, patients may have 1 support person age 86 or older with them. For treatment visits, patients can not have anyone with them due to social distancing guidelines and our immunocompromised population.

## 2022-08-26 NOTE — Progress Notes (Signed)
[facility] [address] [place] [address]    Clinic Day:  08/26/2022  Referring physician: Abigail Miyamoto, [place]  Patient Care Team: Abigail Miyamoto, [place] as PCP - General (Family Medicine) Derek Jack, MD as Medical Oncologist (Medical Oncology) Brien Mates, RN as Oncology Nurse Navigator (Medical Oncology)   ASSESSMENT & PLAN:   Assessment: T2 N0 M0 pancreatic adenocarcinoma: - She presented with painless jaundice in April 2023.  Weight loss of 13 pounds since April. - Stent placement on 09/10/2021 for a bilirubin of 8.2. - 10/03/2021: ERCP and stent removal and stent placement for stent dysfunction with total bilirubin 16.3. - 10/29/2021: ERCP/EUS: Lower CBD stent stenosis from stricture, status post metal stent placement.  EUS showed mass in the superior pancreatic head measuring 2.8 x 2.2 cm, suggesting abutment of the portal vein.  Intact interface was seen between lesion and SMA and celiac trunk suggesting lack of invasion.  Hypoechoic 14 x 12 mm lesion visualized in the liver.  Biopsy could not be attempted due to presence of blood vessel. - She had syncopal episodes in 07/22/2022, had pacemaker placement for bradycardia. - Pathology: Pancreatic head FNA and biliary stricture brushing: Malignant cells consistent with adenocarcinoma. - CA 19-9: 131 - We reviewed MRI of the abdomen with and without contrast (12/24/2021): Pancreatic head mass measures 2.7 x 2.3 cm (2.5 x 1.8 cm previously).  Mass is abutting and slightly displacing the SMA.  No obvious fat plane is demonstrated.  Portal and splenic veins are patent.  Stable small scattered hepatic cysts with no liver metastatic disease. - PET scan (11/20/2021): Ill-defined low-level hypermetabolic activity about the metallic CBD stent and pancreatic uncinate process without discrete CT correlate.  No convincing evidence of hypermetabolic disease in the neck, chest, abdomen or pelvis.  Tiny bilateral lung  nodules measuring up to 3 mm.  -CT pancreatic protocol (01/16/2022): Hypoenhancing mass in the pancreatic head measuring 2.9 x 1.8 cm.  Slightly greater than 180 degrees of abutment with portal vein as well as SMA without definite vascular encasement.  New enhancing 9 mm soft tissue nodule along the posterior aspect of the gallbladder wall?  Suspicious for peritoneal implant. -I have discussed with Dr. Zenia Resides who has seen this patient. -Dr. Zenia Resides thought that this patient might not be a candidate for surgery but she would like to evaluate after 3 to 4 months of neoadjuvant chemotherapy. -We talked about neoadjuvant chemotherapy.  She is not a candidate for FOLFIRINOX.  I have recommended gemcitabine and Abraxane every other week.  Cycle 1 was started on 02/05/2022.   Social/family history: - She lives at home with her husband who has dementia.  She worked as a Char[employer] fundraiser for 33 years.  Non-smoker and no exposure to chemicals.  She has not been driving since 2022/07/22. - Daughter died of glioblastoma.  Mother had ovarian cancer.  Maternal aunt had breast cancer.  Plan: T2 N0 M0 pancreatic adenocarcinoma: - CT CAP on 07/09/2022: Henrene Pastor pancreatic head mass is similar to slightly larger today measuring 3.2 x 2.4 cm compared to 2.9 x 1.8 cm.  There was SMA abutment over about 22 degrees previously and this appears slightly progressive in the interval with soft tissue surrounding the SMA. - I have discussed NGS test results.  Guardant360 did not show MSI high.  No other targetable mutations.  Limited tissue NGS testing did not show any targetable mutations.  MS-stable.  TMB-low. - She does not want to have any surgery  done. - We discussed further options including best supportive care, changing systemic therapy to FOLFOX/FOLFIRINOX/NALIRIFOX. - We also talked about option of chemoradiation therapy after 2 to 3 months of systemic therapy. - I have recommended consultation with radiation oncology Dr. Lynnette Caffey. -  We discussed chemotherapy regimen and side effects.  We will start with FOLFOX and add Irinotecan if tolerated well. - Will likely start cycle 1 next week.  RTC prior to cycle 2.   2.  Weight loss: - She has stable weight since last visit.  She is eating well and drinking 1 can of Ensure high-protein daily.   3.  Normocytic anemia: - Her energy levels have improved.  Hemoglobin improved to 11 after last Venofer 400 mg on 07/31/2022.    No orders of the defined types were placed in this encounter.   I,Alexis Herring,acting as a Education administrator for [employer], MD.,have documented all relevant documentation on the behalf of Derek Jack, MD,as directed by  Derek Jack, MD while in the presence of Derek Jack, MD.  I, Derek Jack MD, have reviewed the above documentation for accuracy and completeness, and I agree with the above.    Alexis Herring   3/27/202411:18 AM  CHIEF COMPLAINT:   Diagnosis: pancreatic adenocarcinoma    Cancer Staging  Pancreatic carcinoma ([place]) Staging form: Exocrine Pancreas, AJCC 8th Edition - Clinical stage from 11/11/2021: Stage IB (cT2, cN0, cM0) - Unsigned   Prior Therapy: none  Current Therapy:  Gemcitabine and Abraxane   HISTORY OF PRESENT ILLNESS:   Oncology History  Pancreatic carcinoma ([place])  11/11/2021 Initial Diagnosis   Pancreatic carcinoma ([place])   02/05/2022 -  Chemotherapy   Patient is on Treatment Plan : PANCREATIC Abraxane D1,15 + Gemcitabine D1,15 q28d       INTERVAL HISTORY:   Meagan [name] is a 88 y.o. female presenting to clinic today for follow up of pancreatic adenocarcinoma. She was last seen by me on 07/15/22.  Today, she states that she is doing well overall. Her appetite level is at 85%. Her energy level is at 80%. She denies any pain. She states that she has felt much more energetic over the last several days. She is staying active and able to perform her ADLs without difficulty. She reports mild leg  weakness but has been able to get around her house and clean. She denies any N/V/D.  PAST MEDICAL HISTORY:   Past Medical History: Past Medical History:  Diagnosis Date   Essential tremor    GERD (gastroesophageal reflux disease)    High cholesterol    Hypertension    PONV (postoperative nausea and vomiting)    Port-A-Cath in place 01/27/2022   Vertigo     Surgical History: Past Surgical History:  Procedure Laterality Date   BILIARY BRUSHING  09/10/2021   Procedure: BILIARY BRUSHING;  Surgeon: Irene Shipper, MD;  Location: [facility];  Service: Gastroenterology;;   BILIARY BRUSHING  10/29/2021   Procedure: BILIARY BRUSHING;  Surgeon: Irving Copas., MD;  Location: [facility];  Service: Gastroenterology;;   BILIARY DILATION  10/29/2021   Procedure: BILIARY DILATION;  Surgeon: Irving Copas., MD;  Location: [facility];  Service: Gastroenterology;;   BILIARY STENT PLACEMENT  09/10/2021   Procedure: BILIARY STENT PLACEMENT;  Surgeon: Irene Shipper, MD;  Location[facility];  Service: Gastroenterology;;   BILIARY STENT PLACEMENT N/A 10/03/2021   Procedure: BILIARY STENT PLACEMENT;  Surgeon: Gatha Mayer, MD;  Location: [facility];  Service: Gastroenterology;  Laterality: N/A;  BILIARY STENT PLACEMENT N/A 10/29/2021   Procedure: BILIARY STENT PLACEMENT;  Surgeon: Rush Landmark Telford Nab., MD;  Location: [facility];  Service: Gastroenterology;  Laterality: N/A;   BILIARY STENT PLACEMENT N/A 05/08/2022   Procedure: BILIARY STENT PLACEMENT;  Surgeon: Gatha Mayer, MD;  Location: [facility];  Service: Gastroenterology;  Laterality: N/A;   BIOPSY  10/29/2021   Procedure: BIOPSY;  Surgeon: Rush Landmark Telford Nab., MD;  Location: [facility];  Service: Gastroenterology;;   ENDOSCOPIC RETROGRADE CHOLANGIOPANCREATOGRAPHY (ERCP) WITH PROPOFOL N/A 10/29/2021   Procedure: ENDOSCOPIC RETROGRADE CHOLANGIOPANCREATOGRAPHY (ERCP) WITH PROPOFOL;  Surgeon: Irving Copas., MD;  Location: [facility];  Service: Gastroenterology;  Laterality: N/A;   ERCP N/A 09/10/2021   Procedure: ENDOSCOPIC RETROGRADE CHOLANGIOPANCREATOGRAPHY (ERCP);  Surgeon: Irene Shipper, MD;  Location: [facility];  Service: Gastroenterology;  Laterality: N/A;   ERCP N/A 10/03/2021   Procedure: ENDOSCOPIC RETROGRADE CHOLANGIOPANCREATOGRAPHY (ERCP);  Surgeon: Gatha Mayer, MD;  Location: [facility];  Service: Gastroenterology;  Laterality: N/A;   ERCP N/A 05/08/2022   Procedure: ENDOSCOPIC RETROGRADE CHOLANGIOPANCREATOGRAPHY (ERCP);  Surgeon: Gatha Mayer, MD;  Location: [facility];  Service: Gastroenterology;  Laterality: N/A;   ESOPHAGOGASTRODUODENOSCOPY (EGD) WITH PROPOFOL N/A 10/29/2021   Procedure: ESOPHAGOGASTRODUODENOSCOPY (EGD) WITH PROPOFOL;  Surgeon: Rush Landmark Telford Nab., MD;  Location: [facility];  Service: Gastroenterology;  Laterality: N/A;   EUS N/A 10/29/2021   Procedure: UPPER ENDOSCOPIC ULTRASOUND (EUS) RADIAL;  Surgeon: Irving Copas., MD;  Location: [facility];  Service: Gastroenterology;  Laterality: N/A;   FINE NEEDLE ASPIRATION  10/29/2021   Procedure: FINE NEEDLE ASPIRATION (FNA) LINEAR;  Surgeon: Irving Copas., MD;  Location: [facility];  Service: Gastroenterology;;   IR IMAGING GUIDED PORT INSERTION  01/28/2022   PACEMAKER IMPLANT     REMOVAL OF STONES  10/29/2021   Procedure: REMOVAL OF SLUDGE;  Surgeon: Irving Copas., MD;  Location: [facility];  Service: Gastroenterology;;   Joan Mayans  09/10/2021   Procedure: Joan Mayans;  Surgeon: Irene Shipper, MD;  Location: [facility] ENDOSCOPY;  Service: Gastroenterology;;   Bess Kinds CHOLANGIOSCOPY N/A 10/29/2021   Procedure: [id number] CHOLANGIOSCOPY;  Surgeon: Irving Copas., MD;  Location: [facility];  Service: Gastroenterology;  Laterality: N/A;   STENT REMOVAL  10/03/2021   Procedure: STENT REMOVAL;  Surgeon: Gatha Mayer, MD;  Location: [facility];   Service: Gastroenterology;;    Social History: Social History   Socioeconomic History   Marital status: Widowed    Spouse name: Not on file   Number of children: Not on file   Years of education: Not on file   Highest education level: Not on file  Occupational History   Not on file  Tobacco Use   Smoking status: Never   Smokeless tobacco: Never  Vaping Use   Vaping Use: Never used  Substance and Sexual Activity   Alcohol use: Never   Drug use: Never   Sexual activity: Not on file  Other Topics Concern   Not on file  Social History Narrative   Not on file   Social Determinants of Health   Financial Resource Strain: Not on file  Food Insecurity: No Food Insecurity (05/07/2022)   Hunger Vital Sign    Worried About Running Out of Food in the Last Year: Never true    Ran Out of Food in the Last Year: Never true  Transportation Needs: No Transportation Needs (05/07/2022)   PRAPARE - Hydrologist (Medical): No    Lack of Transportation (Non-Medical):  No  Physical Activity: Not on file  Stress: Not on file  Social Connections: Not on file  Intimate Partner Violence: Not At Risk (05/07/2022)   Humiliation, Afraid, Rape, and Kick questionnaire    Fear of Current or Ex-Partner: No    Emotionally Abused: No    Physically Abused: No    Sexually Abused: No    Family History: Family History  Problem Relation Age of Onset   Stomach cancer Neg Hx    Esophageal cancer Neg Hx    Colon cancer Neg Hx    Pancreatic cancer Neg Hx     Current Medications:  Current Outpatient Medications:    amLODipine (NORVASC) 2.5 MG tablet, Take 2.5 mg by mouth at bedtime., Disp: , Rfl:    aspirin EC 81 MG tablet, Take 81 mg by mouth daily. Swallow whole., Disp: , Rfl:    Cholecalciferol (VITAMIN D3) 50 MCG (2000 UT) TABS, Take 2,000 Units by mouth daily after supper., Disp: , Rfl:    Coenzyme Q10 (CO Q-10) 100 MG CAPS, Take 100 mg by mouth every evening., Disp: ,  Rfl:    famotidine (PEPCID) 40 MG tablet, Take 40 mg by mouth every evening., Disp: , Rfl:    gabapentin (NEURONTIN) 100 MG capsule, Take 100 mg by mouth 2 (two) times daily., Disp: , Rfl:    lidocaine-prilocaine (EMLA) cream, Apply a small amount to port a cath site (do not rub in) and cover with plastic wrap 1 hour prior to infusion appointments, Disp: 30 g, Rfl: 3   meclizine (ANTIVERT) 25 MG tablet, Take 25 mg by mouth daily as needed for dizziness., Disp: , Rfl:    Misc Natural Products (JOINT SUPPORT) CAPS, Take 1 capsule by mouth daily with breakfast., Disp: , Rfl:    Multiple Vitamin (MULTIVITAMIN) tablet, Take 1 tablet by mouth daily with breakfast., Disp: , Rfl:    Multiple Vitamins-Minerals (OCUVITE EYE HEALTH FORMULA) CAPS, Take 1 capsule by mouth daily., Disp: , Rfl:    nebivolol (BYSTOLIC) 5 MG tablet, Take 2.5 mg by mouth in the morning., Disp: , Rfl:    omeprazole (PRILOSEC) 40 MG capsule, Take 40 mg by mouth daily before breakfast., Disp: , Rfl:    polyethylene glycol (MIRALAX) 17 g packet, Take 17 g by mouth daily as needed. (Patient taking differently: Take 17 g by mouth daily as needed for moderate constipation.), Disp: 14 each, Rfl: 0   Polyvinyl Alcohol-Povidone (REFRESH OP), Place 1 drop into both eyes daily as needed (tired eyes)., Disp: , Rfl:    predniSONE (DELTASONE) 50 MG tablet, Take 13 hours, 7 hours and 1 hour prior to CT scan, Disp: 3 tablet, Rfl: 0   primidone (MYSOLINE) 50 MG tablet, Take 25 mg by mouth daily as needed (for tremors)., Disp: , Rfl:    prochlorperazine (COMPAZINE) 10 MG tablet, Take 1 tablet (10 mg total) by mouth every 6 (six) hours as needed for nausea or vomiting., Disp: 60 tablet, Rfl: 6   pyridOXINE (VITAMIN B-6) 100 MG tablet, Take 50 mg by mouth in the morning., Disp: , Rfl:    tiZANidine (ZANAFLEX) 2 MG tablet, Take 1 mg by mouth daily as needed for muscle spasms., Disp: , Rfl:    diphenhydrAMINE (BENADRYL) 50 MG tablet, Take 1 tablet (50 mg  total) by mouth once for 1 dose. Take 1 hour prior to CT scan, Disp: 1 tablet, Rfl: 0 No current facility-administered medications for this visit.  Facility-Administered Medications Ordered in Other Visits:  sodium chloride flush (NS) 0.9 % injection 10 mL, 10 mL, Intravenous, PRN, Derek Jack, MD, 10 mL at 08/26/22 1021   Allergies: Allergies  Allergen Reactions   Ivp Dye [Iodinated Contrast Media] Hives   Lisinopril Cough    REVIEW OF SYSTEMS:   Review of Systems  Constitutional:  Negative for chills, fatigue and fever.  HENT:   Negative for lump/mass, mouth sores, nosebleeds, sore throat and trouble swallowing.   Eyes:  Negative for eye problems.  Respiratory:  Negative for cough and shortness of breath.   Cardiovascular:  Negative for chest pain, leg swelling and palpitations.  Gastrointestinal:  Negative for abdominal pain, constipation, diarrhea, nausea and vomiting.  Genitourinary:  Negative for bladder incontinence, difficulty urinating, dysuria, frequency, hematuria and nocturia.   Musculoskeletal:  Negative for arthralgias, back pain, flank pain, myalgias and neck pain.  Skin:  Negative for itching and rash.  Neurological:  Negative for dizziness, headaches and numbness.  Hematological:  Does not bruise/bleed easily.  Psychiatric/Behavioral:  Positive for depression. Negative for sleep disturbance and suicidal ideas. The patient is not nervous/anxious.   All other systems reviewed and are negative.   VITALS:   There were no vitals taken for this visit.  Wt Readings from Last 3 Encounters:  08/26/22 117 lb 9.6 oz (53.3 kg)  07/15/22 117 lb 6.4 oz (53.3 kg)  07/02/22 118 lb 6.4 oz (53.7 kg)    There is no height or weight on file to calculate BMI.  Performance status (ECOG): 1 - Symptomatic but completely ambulatory  PHYSICAL EXAM:   Physical Exam Vitals and nursing note reviewed. Exam conducted with a chaperone present.  Constitutional:       Appearance: Normal appearance.  Cardiovascular:     Rate and Rhythm: Normal rate and regular rhythm.     Pulses: Normal pulses.     Heart sounds: Normal heart sounds.  Pulmonary:     Effort: Pulmonary effort is normal.     Breath sounds: Normal breath sounds.  Abdominal:     Palpations: Abdomen is soft. There is no hepatomegaly, splenomegaly or mass.     Tenderness: There is no abdominal tenderness.  Musculoskeletal:     Right lower leg: No edema.     Left lower leg: No edema.  Lymphadenopathy:     Cervical: No cervical adenopathy.     Right cervical: No superficial, deep or posterior cervical adenopathy.    Left cervical: No superficial, deep or posterior cervical adenopathy.     Upper Body:     Right upper body: No supraclavicular or axillary adenopathy.     Left upper body: No supraclavicular or axillary adenopathy.  Neurological:     General: No focal deficit present.     Mental Status: She is alert and oriented to person, place, and time.  Psychiatric:        Mood and Affect: Mood normal.        Behavior: Behavior normal.    LABS:      Latest Ref Rng & Units 08/26/2022   10:13 AM 07/15/2022    9:17 AM 07/02/2022   10:10 AM  CBC  WBC 4.0 - 10.5 K/uL 6.3  5.9  7.2   Hemoglobin 12.0 - 15.0 g/dL 11.0  9.8  9.6   Hematocrit 36.0 - 46.0 % 34.7  30.8  30.8   Platelets 150 - 400 K/uL 231  257  259       Latest Ref Rng & Units 08/26/2022  10:13 AM 07/15/2022    9:17 AM 07/02/2022   10:10 AM  CMP  Glucose 70 - 99 mg/dL 112  105  86   BUN 8 - 23 mg/dL 29  30  31    Creatinine 0.44 - 1.00 mg/dL 0.97  1.02  1.05   Sodium 135 - 145 mmol/L 135  137  136   Potassium 3.5 - 5.1 mmol/L 4.4  4.5  4.4   Chloride 98 - 111 mmol/L 104  105  103   CO2 22 - 32 mmol/L 25  24  24    Calcium 8.9 - 10.3 mg/dL 9.4  9.7  10.0   Total Protein 6.5 - 8.1 g/dL 6.4  6.4  6.6   Total Bilirubin 0.3 - 1.2 mg/dL 0.6  0.5  0.6   Alkaline Phos 38 - 126 U/L 252  148  243   AST 15 - 41 U/L 48  31  57    ALT 0 - 44 U/L 74  43  81    No results found for: "CEA1", "CEA" / No results found for: "CEA1", "CEA" No results found for: "PSA1" Lab Results  Component Value Date   [id number] (H) 07/02/2022   No results found for: "[id number]"  No results found for: "TOTALPROTELP", "ALBUMINELP", "A1GS", "A2GS", "BETS", "BETA2SER", "GAMS", "MSPIKE", "SPEI" Lab Results  Component Value Date   TIBC 438 06/18/2022   FERRITIN 32 06/18/2022   IRONPCTSAT 13 06/18/2022   No results found for: "LDH"  STUDIES:   No results found.

## 2022-08-31 ENCOUNTER — Inpatient Hospital Stay: Payer: Medicare HMO | Attending: Hematology

## 2022-08-31 ENCOUNTER — Inpatient Hospital Stay: Payer: Medicare HMO

## 2022-08-31 VITALS — BP 160/70 | HR 78 | Temp 97.5°F | Resp 18

## 2022-08-31 VITALS — BP 141/61 | HR 64 | Temp 98.4°F | Resp 18 | Wt 114.6 lb

## 2022-08-31 DIAGNOSIS — Z5111 Encounter for antineoplastic chemotherapy: Secondary | ICD-10-CM | POA: Diagnosis present

## 2022-08-31 DIAGNOSIS — Z95828 Presence of other vascular implants and grafts: Secondary | ICD-10-CM

## 2022-08-31 DIAGNOSIS — D649 Anemia, unspecified: Secondary | ICD-10-CM | POA: Diagnosis not present

## 2022-08-31 DIAGNOSIS — C259 Malignant neoplasm of pancreas, unspecified: Secondary | ICD-10-CM

## 2022-08-31 DIAGNOSIS — C250 Malignant neoplasm of head of pancreas: Secondary | ICD-10-CM | POA: Insufficient documentation

## 2022-08-31 LAB — CBC WITH DIFFERENTIAL/PLATELET
Abs Immature Granulocytes: 0.02 10*3/uL (ref 0.00–0.07)
Basophils Absolute: 0.1 10*3/uL (ref 0.0–0.1)
Basophils Relative: 1 %
Eosinophils Absolute: 0.2 10*3/uL (ref 0.0–0.5)
Eosinophils Relative: 2 %
HCT: 34.5 % — ABNORMAL LOW (ref 36.0–46.0)
Hemoglobin: 10.9 g/dL — ABNORMAL LOW (ref 12.0–15.0)
Immature Granulocytes: 0 %
Lymphocytes Relative: 26 %
Lymphs Abs: 1.7 10*3/uL (ref 0.7–4.0)
MCH: 29.9 pg (ref 26.0–34.0)
MCHC: 31.6 g/dL (ref 30.0–36.0)
MCV: 94.8 fL (ref 80.0–100.0)
Monocytes Absolute: 0.8 10*3/uL (ref 0.1–1.0)
Monocytes Relative: 12 %
Neutro Abs: 3.9 10*3/uL (ref 1.7–7.7)
Neutrophils Relative %: 59 %
Platelets: 242 10*3/uL (ref 150–400)
RBC: 3.64 MIL/uL — ABNORMAL LOW (ref 3.87–5.11)
RDW: 18.2 % — ABNORMAL HIGH (ref 11.5–15.5)
WBC: 6.6 10*3/uL (ref 4.0–10.5)
nRBC: 0 % (ref 0.0–0.2)

## 2022-08-31 LAB — COMPREHENSIVE METABOLIC PANEL
ALT: 178 U/L — ABNORMAL HIGH (ref 0–44)
AST: 67 U/L — ABNORMAL HIGH (ref 15–41)
Albumin: 3.4 g/dL — ABNORMAL LOW (ref 3.5–5.0)
Alkaline Phosphatase: 331 U/L — ABNORMAL HIGH (ref 38–126)
Anion gap: 9 (ref 5–15)
BUN: 37 mg/dL — ABNORMAL HIGH (ref 8–23)
CO2: 21 mmol/L — ABNORMAL LOW (ref 22–32)
Calcium: 10 mg/dL (ref 8.9–10.3)
Chloride: 105 mmol/L (ref 98–111)
Creatinine, Ser: 1.22 mg/dL — ABNORMAL HIGH (ref 0.44–1.00)
GFR, Estimated: 43 mL/min — ABNORMAL LOW (ref >60)
Glucose, Bld: 154 mg/dL — ABNORMAL HIGH (ref 70–99)
Potassium: 3.6 mmol/L (ref 3.5–5.1)
Sodium: 135 mmol/L (ref 135–145)
Total Bilirubin: 0.7 mg/dL (ref 0.3–1.2)
Total Protein: 6.6 g/dL (ref 6.5–8.1)

## 2022-08-31 LAB — MAGNESIUM: Magnesium: 2.2 mg/dL (ref 1.7–2.4)

## 2022-08-31 MED ORDER — PALONOSETRON HCL INJECTION 0.25 MG/5ML
0.2500 mg | Freq: Once | INTRAVENOUS | Status: AC
  Administered 2022-08-31: 0.25 mg via INTRAVENOUS
  Filled 2022-08-31: qty 5

## 2022-08-31 MED ORDER — OXALIPLATIN CHEMO INJECTION 100 MG/20ML
68.0000 mg/m2 | Freq: Once | INTRAVENOUS | Status: AC
  Administered 2022-08-31: 100 mg via INTRAVENOUS
  Filled 2022-08-31: qty 20

## 2022-08-31 MED ORDER — SODIUM CHLORIDE 0.9% FLUSH
10.0000 mL | Freq: Once | INTRAVENOUS | Status: AC
  Administered 2022-08-31: 10 mL via INTRAVENOUS

## 2022-08-31 MED ORDER — DEXTROSE 5 % IV SOLN: Freq: Once | INTRAVENOUS | Status: AC

## 2022-08-31 MED ORDER — SODIUM CHLORIDE 0.9 % IV SOLN
1920.0000 mg/m2 | INTRAVENOUS | Status: DC
  Administered 2022-08-31: 3000 mg via INTRAVENOUS
  Filled 2022-08-31: qty 60

## 2022-08-31 MED ORDER — SODIUM CHLORIDE 0.9 % IV SOLN
150.0000 mg | Freq: Once | INTRAVENOUS | Status: AC
  Administered 2022-08-31: 150 mg via INTRAVENOUS
  Filled 2022-08-31: qty 150

## 2022-08-31 MED ORDER — SODIUM CHLORIDE 0.9 % IV SOLN
10.0000 mg | Freq: Once | INTRAVENOUS | Status: AC
  Administered 2022-08-31: 10 mg via INTRAVENOUS
  Filled 2022-08-31: qty 10

## 2022-08-31 MED ORDER — LEUCOVORIN CALCIUM INJECTION 350 MG
320.0000 mg/m2 | Freq: Once | INTRAVENOUS | Status: AC
  Administered 2022-08-31: 500 mg via INTRAVENOUS
  Filled 2022-08-31: qty 25

## 2022-08-31 NOTE — Progress Notes (Signed)
Labs reviewed today. Consent obtained. Patient watched video on 5FU amublatory pump labs reviewed with Md today, ok to treat with liver enzymes elevated .  Will proceed as planned.   Treatment given per orders. Educated patient on amublatory pump once administered. Patient tolerated it well without problems. Vitals stable and discharged home from clinic ambulatory. Follow up as scheduled.

## 2022-08-31 NOTE — Patient Instructions (Signed)
[facility]  Discharge Instructions: Thank you for choosing [facility] to provide your oncology and hematology care.  If you have a lab appointment with the [facility], please come in thru the Main Entrance and check in at the main information desk.  Wear comfortable clothing and clothing appropriate for easy access to any Portacath or PICC line.   We strive to give you quality time with your provider. You may need to reschedule your appointment if you arrive late (15 or more minutes).  Arriving late affects you and other patients whose appointments are after yours.  Also, if you miss three or more appointments without notifying the office, you may be dismissed from the clinic at the provider's discretion.      For prescription refill requests, have your pharmacy contact our office and allow 72 hours for refills to be completed.    Today you received the following chemotherapy and/or immunotherapy agents oxaliplatin, leucovorin, flurourocil   To help prevent nausea and vomiting after your treatment, we encourage you to take your nausea medication as directed.  BELOW ARE SYMPTOMS THAT SHOULD BE REPORTED IMMEDIATELY: *FEVER GREATER THAN 100.4 F (38 C) OR HIGHER *CHILLS OR SWEATING *NAUSEA AND VOMITING THAT IS NOT CONTROLLED WITH YOUR NAUSEA MEDICATION *UNUSUAL SHORTNESS OF BREATH *UNUSUAL BRUISING OR BLEEDING *URINARY PROBLEMS (pain or burning when urinating, or frequent urination) *BOWEL PROBLEMS (unusual diarrhea, constipation, pain near the anus) TENDERNESS IN MOUTH AND THROAT WITH OR WITHOUT PRESENCE OF ULCERS (sore throat, sores in mouth, or a toothache) UNUSUAL RASH, SWELLING OR PAIN  UNUSUAL VAGINAL DISCHARGE OR ITCHING   Items with * indicate a potential emergency and should be followed up as soon as possible or go to the Emergency Department if any problems should occur.  Please show the CHEMOTHERAPY ALERT CARD or IMMUNOTHERAPY ALERT CARD at  check-in to the Emergency Department and triage nurse.  Should you have questions after your visit or need to cancel or reschedule your appointment, please contact [facility] [phone]  and follow the prompts.  Office hours are 8:00 a.m. to 4:30 p.m. Monday - Friday. Please note that voicemails left after 4:00 p.m. may not be returned until the following business day.  We are closed weekends and major holidays. You have access to a nurse at all times for urgent questions. Please call the main number to the clinic [phone] and follow the prompts.  For any non-urgent questions, you may also contact your provider using MyChart. We now offer e-Visits for anyone 76 and older to request care online for non-urgent symptoms. For details visit [email or web address].   Also download the MyChart app! Go to the app store, search "MyChart", open the app, select , and log in with your MyChart username and password.

## 2022-08-31 NOTE — Progress Notes (Signed)
Ok to treat with today's LFT elevation.  T.O. Dr Rhys Martini, PharmD

## 2022-09-01 ENCOUNTER — Other Ambulatory Visit: Payer: Self-pay

## 2022-09-01 LAB — CANCER ANTIGEN 19-9: CA 19-9: 385 U/mL — ABNORMAL HIGH (ref 0–35)

## 2022-09-02 ENCOUNTER — Inpatient Hospital Stay: Payer: Medicare HMO

## 2022-09-02 ENCOUNTER — Ambulatory Visit: Payer: Medicare HMO

## 2022-09-02 ENCOUNTER — Other Ambulatory Visit: Payer: Medicare HMO

## 2022-09-02 VITALS — BP 148/75 | HR 66 | Temp 98.2°F | Resp 18

## 2022-09-02 DIAGNOSIS — Z5111 Encounter for antineoplastic chemotherapy: Secondary | ICD-10-CM | POA: Diagnosis not present

## 2022-09-02 DIAGNOSIS — C259 Malignant neoplasm of pancreas, unspecified: Secondary | ICD-10-CM

## 2022-09-02 DIAGNOSIS — Z95828 Presence of other vascular implants and grafts: Secondary | ICD-10-CM

## 2022-09-02 MED ORDER — HEPARIN SOD (PORK) LOCK FLUSH 100 UNIT/ML IV SOLN
500.0000 [IU] | Freq: Once | INTRAVENOUS | Status: AC | PRN
  Administered 2022-09-02: 500 [IU]

## 2022-09-02 MED ORDER — SODIUM CHLORIDE 0.9% FLUSH
10.0000 mL | INTRAVENOUS | Status: DC | PRN
  Administered 2022-09-02: 10 mL

## 2022-09-02 NOTE — Progress Notes (Signed)
Patient presents today for 5FU chemotherapy per provider's order. Vital signs stable, port flushed easily without difficulty with 10 mL of normal saline and 5 mL of heparin. Good blood return noted and needle removed intact. No bruising or swelling noted at the site.  Discharged from clinic via wheelchair in stable condition. Alert and oriented x 3. F/U with [facility] as scheduled.

## 2022-09-02 NOTE — Patient Instructions (Signed)
[facility]  Discharge Instructions: Thank you for choosing [facility] to provide your oncology and hematology care.  If you have a lab appointment with the [facility] - please note that after April 8th, 2024, all labs will be drawn in the cancer center.  You do not have to check in or register with the main entrance as you have in the past but will complete your check-in in the cancer center.  Wear comfortable clothing and clothing appropriate for easy access to any Portacath or PICC line.   We strive to give you quality time with your provider. You may need to reschedule your appointment if you arrive late (15 or more minutes).  Arriving late affects you and other patients whose appointments are after yours.  Also, if you miss three or more appointments without notifying the office, you may be dismissed from the clinic at the provider's discretion.      For prescription refill requests, have your pharmacy contact our office and allow 72 hours for refills to be completed.    Today you received the following chemotherapy and/or immunotherapy agents 5FU pump disconnection.     To help prevent nausea and vomiting after your treatment, we encourage you to take your nausea medication as directed.  BELOW ARE SYMPTOMS THAT SHOULD BE REPORTED IMMEDIATELY: *FEVER GREATER THAN 100.4 F (38 C) OR HIGHER *CHILLS OR SWEATING *NAUSEA AND VOMITING THAT IS NOT CONTROLLED WITH YOUR NAUSEA MEDICATION *UNUSUAL SHORTNESS OF BREATH *UNUSUAL BRUISING OR BLEEDING *URINARY PROBLEMS (pain or burning when urinating, or frequent urination) *BOWEL PROBLEMS (unusual diarrhea, constipation, pain near the anus) TENDERNESS IN MOUTH AND THROAT WITH OR WITHOUT PRESENCE OF ULCERS (sore throat, sores in mouth, or a toothache) UNUSUAL RASH, SWELLING OR PAIN  UNUSUAL VAGINAL DISCHARGE OR ITCHING   Items with * indicate a potential emergency and should be followed up as soon as possible  or go to the Emergency Department if any problems should occur.  Please show the CHEMOTHERAPY ALERT CARD or IMMUNOTHERAPY ALERT CARD at check-in to the Emergency Department and triage nurse.  Should you have questions after your visit or need to cancel or reschedule your appointment, please contact [facility] [phone]  and follow the prompts.  Office hours are 8:00 a.m. to 4:30 p.m. Monday - Friday. Please note that voicemails left after 4:00 p.m. may not be returned until the following business day.  We are closed weekends and major holidays. You have access to a nurse at all times for urgent questions. Please call the main number to the clinic [phone] and follow the prompts.  For any non-urgent questions, you may also contact your provider using MyChart. We now offer e-Visits for anyone 74 and older to request care online for non-urgent symptoms. For details visit [email or web address].   Also download the MyChart app! Go to the app store, search "MyChart", open the app, select [facility], and log in with your MyChart username and password.

## 2022-09-03 NOTE — Progress Notes (Signed)
24 hour follow up call back. Patient stated she is doing well. No issues other than feeling tired. Patient is eating and drinking well. No other issues at this time.

## 2022-09-13 NOTE — Progress Notes (Signed)
[facility] [address], [place] [address]    Clinic Day:  09/25/2022  Referring physician: [name], [name]  Patient Care Team: [name], [name] as PCP - General (Family Medicine) [name], MD as Medical Oncologist (Medical Oncology) [name], RN as Oncology Nurse Navigator (Medical Oncology)   ASSESSMENT & PLAN:   Assessment: 1. T2 N0 M0 pancreatic adenocarcinoma: - She presented with painless jaundice in April 2023.  Weight loss of 13 pounds since April. - Stent placement on 09/10/2021 for a bilirubin of 8.2. - 10/03/2021: ERCP and stent removal and stent placement for stent dysfunction with total bilirubin 16.3. - 10/29/2021: ERCP/EUS: Lower CBD stent stenosis from stricture, status post metal stent placement.  EUS showed mass in the superior pancreatic head measuring 2.8 x 2.2 cm, suggesting abutment of the portal vein.  Intact interface was seen between lesion and SMA and celiac trunk suggesting lack of invasion.  Hypoechoic 14 x 12 mm lesion visualized in the liver.  Biopsy could not be attempted due to presence of blood vessel. - She had syncopal episodes in 06/19/2022, had pacemaker placement for bradycardia. - Pathology: Pancreatic head FNA and biliary stricture brushing: Malignant cells consistent with adenocarcinoma. - CA 19-9: 131 - We reviewed MRI of the abdomen with and without contrast (12/24/2021): Pancreatic head mass measures 2.7 x 2.3 cm (2.5 x 1.8 cm previously).  Mass is abutting and slightly displacing the SMA.  No obvious fat plane is demonstrated.  Portal and splenic veins are patent.  Stable small scattered hepatic cysts with no liver metastatic disease. - PET scan (11/20/2021): Ill-defined low-level hypermetabolic activity about the metallic CBD stent and pancreatic uncinate process without discrete CT correlate.  No convincing evidence of hypermetabolic disease in the neck, chest, abdomen or pelvis.  Tiny bilateral  lung nodules measuring up to 3 mm.  -CT pancreatic protocol (01/16/2022): Hypoenhancing mass in the pancreatic head measuring 2.9 x 1.8 cm.  Slightly greater than 180 degrees of abutment with portal vein as well as SMA without definite vascular encasement.  New enhancing 9 mm soft tissue nodule along the posterior aspect of the gallbladder wall?  Suspicious for peritoneal implant. -I have discussed with Dr. Freida Busman who has seen this patient. -Dr. Freida Busman thought that this patient might not be a candidate for surgery but she would like to evaluate after 3 to 4 months of neoadjuvant chemotherapy. -We talked about neoadjuvant chemotherapy.  She is not a candidate for FOLFIRINOX.  I have recommended gemcitabine and Abraxane every other week.  Cycle 1 was started on 02/05/2022.  - I have discussed NGS test results.  Guardant360 did not show MSI high.  No other targetable mutations.  Limited tissue NGS testing did not show any targetable mutations.  MS-stable.  TMB-low.  2. Social/family history: - She lives at home with her husband who has dementia.  She worked as a Conservation officer, nature for 33 years.  Non-smoker and no exposure to chemicals.  She has not been driving since 06/19/2022. - Daughter died of glioblastoma.  Mother had ovarian cancer.  Maternal aunt had breast cancer.   Plan: T2 N0 M0 pancreatic adenocarcinoma: - She has tolerated cycle 1 of FOLFOX with 80% dose reduction on 08/31/2022 reasonably well. - She felt weak for 2 to 3 days after last treatment. - Reviewed labs today: AST and ALT are slightly elevated but improved since last treatment.  Creatinine is 1.12.  CBC was grossly normal.  CA 19-9 is  385. - She will proceed with cycle 2 FOLFOX at 80% dose. - We will send the genetic testing.  2.  Weight loss: - She has gained 1 pound since last visit.  Continue Ensure high-protein 1 can daily.   3.  Normocytic anemia: - Last Venofer 400 mg on 07/31/2022.  Hemoglobin today is 10.6.  Energy levels are  improved.  No orders of the defined types were placed in this encounter.     I,Katie Daubenspeck,acting as a Neurosurgeon for [name], MD.,have documented all relevant documentation on the behalf of [name], MD,as directed by  [name], MD while in the presence of [name], MD.   I, [name] MD, have reviewed the above documentation for accuracy and completeness, and I agree with the above.   [name], MD   4/26/20246:02 PM  CHIEF COMPLAINT:   Diagnosis: pancreatic adenocarcinoma    Cancer Staging  Pancreatic carcinoma [facility]) Staging form: Exocrine Pancreas, AJCC 8th Edition - Clinical stage from 11/11/2021: Stage IB (cT2, cN0, cM0) - Unsigned    Prior Therapy: Gemcitabine and Abraxane   Current Therapy:  starting FOLFOX    HISTORY OF PRESENT ILLNESS:   Oncology History  Pancreatic carcinoma (HCC)  11/11/2021 Initial Diagnosis   Pancreatic carcinoma (HCC)   02/05/2022 - 07/02/2022 Chemotherapy   Patient is on Treatment Plan : PANCREATIC Abraxane D1,15 + Gemcitabine D1,15 q28d     08/31/2022 -  Chemotherapy   Patient is on Treatment Plan : PANCREAS Modified FOLFIRINOX q14d x 4 cycles        INTERVAL HISTORY:   Meagan [name] is a 88 y.o. female presenting to clinic today for follow up of pancreatic adenocarcinoma. She was last seen by me on 08/26/22.  Today, she states that she is doing well overall. Her appetite level is at 100%. Her energy level is at 80%.  PAST MEDICAL HISTORY:   Past Medical History: Past Medical History:  Diagnosis Date   Essential tremor    GERD (gastroesophageal reflux disease)    High cholesterol    Hypertension    PONV (postoperative nausea and vomiting)    Port-A-Cath in place 01/27/2022   Vertigo     Surgical History: Past Surgical History:  Procedure Laterality Date   BILIARY BRUSHING  09/10/2021   Procedure: BILIARY BRUSHING;  Surgeon: Hilarie Fredrickson, MD;  Location: [facility]  ENDOSCOPY;  Service: Gastroenterology;;   BILIARY BRUSHING  10/29/2021   Procedure: BILIARY BRUSHING;  Surgeon: Lemar Lofty., MD;  Location: [facility] ENDOSCOPY;  Service: Gastroenterology;;   BILIARY DILATION  10/29/2021   Procedure: BILIARY DILATION;  Surgeon: Lemar Lofty., MD;  Location: [facility] ENDOSCOPY;  Service: Gastroenterology;;   BILIARY STENT PLACEMENT  09/10/2021   Procedure: BILIARY STENT PLACEMENT;  Surgeon: Hilarie Fredrickson, MD;  Location: [facility] ENDOSCOPY;  Service: Gastroenterology;;   BILIARY STENT PLACEMENT N/A 10/03/2021   Procedure: BILIARY STENT PLACEMENT;  Surgeon: Iva Boop, MD;  Location: [facility] ENDOSCOPY;  Service: Gastroenterology;  Laterality: N/A;   BILIARY STENT PLACEMENT N/A 10/29/2021   Procedure: BILIARY STENT PLACEMENT;  Surgeon: Meridee Score Netty Starring., MD;  Location: [facility] ENDOSCOPY;  Service: Gastroenterology;  Laterality: N/A;   BILIARY STENT PLACEMENT N/A 05/08/2022   Procedure: BILIARY STENT PLACEMENT;  Surgeon: Iva Boop, MD;  Location: [facility] ENDOSCOPY;  Service: Gastroenterology;  Laterality: N/A;   BIOPSY  10/29/2021   Procedure: BIOPSY;  Surgeon: Meridee Score Netty Starring., MD;  Location: [facility] ENDOSCOPY;  Service: Gastroenterology;;   ENDOSCOPIC RETROGRADE CHOLANGIOPANCREATOGRAPHY (ERCP)  WITH PROPOFOL N/A 10/29/2021   Procedure: ENDOSCOPIC RETROGRADE CHOLANGIOPANCREATOGRAPHY (ERCP) WITH PROPOFOL;  Surgeon: Meridee Score Netty Starring., MD;  Location: [facility] ENDOSCOPY;  Service: Gastroenterology;  Laterality: N/A;   ERCP N/A 09/10/2021   Procedure: ENDOSCOPIC RETROGRADE CHOLANGIOPANCREATOGRAPHY (ERCP);  Surgeon: Hilarie Fredrickson, MD;  Location: [facility] ENDOSCOPY;  Service: Gastroenterology;  Laterality: N/A;   ERCP N/A 10/03/2021   Procedure: ENDOSCOPIC RETROGRADE CHOLANGIOPANCREATOGRAPHY (ERCP);  Surgeon: Iva Boop, MD;  Location: [facility] ENDOSCOPY;  Service: Gastroenterology;  Laterality: N/A;   ERCP N/A 05/08/2022   Procedure: ENDOSCOPIC RETROGRADE CHOLANGIOPANCREATOGRAPHY  (ERCP);  Surgeon: Iva Boop, MD;  Location: [facility] ENDOSCOPY;  Service: Gastroenterology;  Laterality: N/A;   ESOPHAGOGASTRODUODENOSCOPY (EGD) WITH PROPOFOL N/A 10/29/2021   Procedure: ESOPHAGOGASTRODUODENOSCOPY (EGD) WITH PROPOFOL;  Surgeon: Meridee Score Netty Starring., MD;  Location: [facility] ENDOSCOPY;  Service: Gastroenterology;  Laterality: N/A;   EUS N/A 10/29/2021   Procedure: UPPER ENDOSCOPIC ULTRASOUND (EUS) RADIAL;  Surgeon: Lemar Lofty., MD;  Location: [facility] ENDOSCOPY;  Service: Gastroenterology;  Laterality: N/A;   FINE NEEDLE ASPIRATION  10/29/2021   Procedure: FINE NEEDLE ASPIRATION (FNA) LINEAR;  Surgeon: Lemar Lofty., MD;  Location: [facility] ENDOSCOPY;  Service: Gastroenterology;;   IR IMAGING GUIDED PORT INSERTION  01/28/2022   PACEMAKER IMPLANT     REMOVAL OF STONES  10/29/2021   Procedure: REMOVAL OF SLUDGE;  Surgeon: Lemar Lofty., MD;  Location: [facility] ENDOSCOPY;  Service: Gastroenterology;;   Dennison Mascot  09/10/2021   Procedure: Dennison Mascot;  Surgeon: Hilarie Fredrickson, MD;  Location: [facility] ENDOSCOPY;  Service: Gastroenterology;;   [facility] [place] CHOLANGIOSCOPY N/A 10/29/2021   Procedure: [id number] CHOLANGIOSCOPY;  Surgeon: Lemar Lofty., MD;  Location: [facility] ENDOSCOPY;  Service: Gastroenterology;  Laterality: N/A;   STENT REMOVAL  10/03/2021   Procedure: STENT REMOVAL;  Surgeon: Iva Boop, MD;  Location: [facility] ENDOSCOPY;  Service: Gastroenterology;;    Social History: Social History   Socioeconomic History   Marital status: Widowed    Spouse name: Not on file   Number of children: Not on file   Years of education: Not on file   Highest education level: Not on file  Occupational History   Not on file  Tobacco Use   Smoking status: Never   Smokeless tobacco: Never  Vaping Use   Vaping Use: Never used  Substance and Sexual Activity   Alcohol use: Never   Drug use: Never   Sexual activity: Not on file  Other Topics Concern   Not on file  Social History  Narrative   Not on file   Social Determinants of Health   Financial Resource Strain: Not on file  Food Insecurity: No Food Insecurity (05/07/2022)   Hunger Vital Sign    Worried About Running Out of Food in the Last Year: Never true    Ran Out of Food in the Last Year: Never true  Transportation Needs: No Transportation Needs (05/07/2022)   PRAPARE - Administrator, Civil Service (Medical): No    Lack of Transportation (Non-Medical): No  Physical Activity: Not on file  Stress: Not on file  Social Connections: Not on file  Intimate Partner Violence: Not At Risk (05/07/2022)   Humiliation, Afraid, Rape, and Kick questionnaire    Fear of Current or Ex-Partner: No    Emotionally Abused: No    Physically Abused: No    Sexually Abused: No    Family History: Family History  Problem Relation Age of Onset   Stomach cancer Neg Hx    Esophageal  cancer Neg Hx    Colon cancer Neg Hx    Pancreatic cancer Neg Hx     Current Medications:  Current Outpatient Medications:    amLODipine (NORVASC) 5 MG tablet, 1 tab(s) orally once a day for 90 days, Disp: , Rfl:    aspirin EC 81 MG tablet, Take 81 mg by mouth daily. Swallow whole., Disp: , Rfl:    Cholecalciferol (VITAMIN D3) 50 MCG (2000 UT) TABS, Take 2,000 Units by mouth daily after supper., Disp: , Rfl:    Coenzyme Q10 (CO Q-10) 100 MG CAPS, Take 100 mg by mouth every evening., Disp: , Rfl:    famotidine (PEPCID) 40 MG tablet, Take 40 mg by mouth every evening., Disp: , Rfl:    gabapentin (NEURONTIN) 100 MG capsule, Take 100 mg by mouth 2 (two) times daily., Disp: , Rfl:    meclizine (ANTIVERT) 25 MG tablet, Take 25 mg by mouth daily as needed for dizziness., Disp: , Rfl:    Misc Natural Products (JOINT SUPPORT) CAPS, Take 1 capsule by mouth daily with breakfast., Disp: , Rfl:    Multiple Vitamin (MULTIVITAMIN) tablet, Take 1 tablet by mouth daily with breakfast., Disp: , Rfl:    Multiple Vitamins-Minerals (OCUVITE EYE HEALTH  FORMULA) CAPS, Take 1 capsule by mouth daily., Disp: , Rfl:    nebivolol (BYSTOLIC) 5 MG tablet, Take 2.5 mg by mouth in the morning., Disp: , Rfl:    omeprazole (PRILOSEC) 40 MG capsule, Take 40 mg by mouth daily before breakfast., Disp: , Rfl:    polyethylene glycol (MIRALAX) 17 g packet, Take 17 g by mouth daily as needed. (Patient taking differently: Take 17 g by mouth daily as needed for moderate constipation.), Disp: 14 each, Rfl: 0   Polyvinyl Alcohol-Povidone (REFRESH OP), Place 1 drop into both eyes daily as needed (tired eyes)., Disp: , Rfl:    predniSONE (DELTASONE) 50 MG tablet, Take 13 hours, 7 hours and 1 hour prior to CT scan, Disp: 3 tablet, Rfl: 0   primidone (MYSOLINE) 50 MG tablet, Take 25 mg by mouth daily as needed (for tremors)., Disp: , Rfl:    pyridOXINE (VITAMIN B-6) 100 MG tablet, Take 50 mg by mouth in the morning., Disp: , Rfl:    tiZANidine (ZANAFLEX) 2 MG tablet, Take 1 mg by mouth daily as needed for muscle spasms., Disp: , Rfl:    diphenhydrAMINE (BENADRYL) 50 MG tablet, Take 1 tablet (50 mg total) by mouth once for 1 dose. Take 1 hour prior to CT scan, Disp: 1 tablet, Rfl: 0   megestrol (MEGACE) 400 MG/10ML suspension, Take 10 mLs (400 mg total) by mouth 2 (two) times daily., Disp: 480 mL, Rfl: 0   Allergies: Allergies  Allergen Reactions   Ivp Dye [Iodinated Contrast Media] Hives   Lisinopril Cough    REVIEW OF SYSTEMS:   Review of Systems  Constitutional:  Negative for chills, fatigue and fever.  HENT:   Negative for lump/mass, mouth sores, nosebleeds, sore throat and trouble swallowing.   Eyes:  Negative for eye problems.  Respiratory:  Negative for cough and shortness of breath.   Cardiovascular:  Negative for chest pain, leg swelling and palpitations.  Gastrointestinal:  Positive for constipation. Negative for abdominal pain, diarrhea, nausea and vomiting.  Genitourinary:  Negative for bladder incontinence, difficulty urinating, dysuria, frequency,  hematuria and nocturia.   Musculoskeletal:  Negative for arthralgias, back pain, flank pain, myalgias and neck pain.  Skin:  Negative for itching and rash.  Neurological:  Negative for dizziness, headaches and numbness.  Hematological:  Does not bruise/bleed easily.  Psychiatric/Behavioral:  Positive for depression. Negative for sleep disturbance and suicidal ideas. The patient is not nervous/anxious.   All other systems reviewed and are negative.    VITALS:   Blood pressure (!) 150/78.  Wt Readings from Last 3 Encounters:  09/14/22 115 lb (52.2 kg)  08/31/22 114 lb 9.6 oz (52 kg)  08/26/22 117 lb 9.6 oz (53.3 kg)    There is no height or weight on file to calculate BMI.  Performance status (ECOG): 1 - Symptomatic but completely ambulatory  PHYSICAL EXAM:   Physical Exam Vitals and nursing note reviewed. Exam conducted with a chaperone present.  Constitutional:      Appearance: Normal appearance.  Cardiovascular:     Rate and Rhythm: Normal rate and regular rhythm.     Pulses: Normal pulses.     Heart sounds: Normal heart sounds.  Pulmonary:     Effort: Pulmonary effort is normal.     Breath sounds: Normal breath sounds.  Abdominal:     Palpations: Abdomen is soft. There is no hepatomegaly, splenomegaly or mass.     Tenderness: There is no abdominal tenderness.  Musculoskeletal:     Right lower leg: No edema.     Left lower leg: No edema.  Lymphadenopathy:     Cervical: No cervical adenopathy.     Right cervical: No superficial, deep or posterior cervical adenopathy.    Left cervical: No superficial, deep or posterior cervical adenopathy.     Upper Body:     Right upper body: No supraclavicular or axillary adenopathy.     Left upper body: No supraclavicular or axillary adenopathy.  Neurological:     General: No focal deficit present.     Mental Status: She is alert and oriented to person, place, and time.  Psychiatric:        Mood and Affect: Mood normal.         Behavior: Behavior normal.     LABS:      Latest Ref Rng & Units 09/14/2022    8:58 AM 08/31/2022    9:04 AM 08/26/2022   10:13 AM  CBC  WBC 4.0 - 10.5 K/uL 5.7  6.6  6.3   Hemoglobin 12.0 - 15.0 g/dL 16.1  09.6  04.5   Hematocrit 36.0 - 46.0 % 33.0  34.5  34.7   Platelets 150 - 400 K/uL 244  242  231       Latest Ref Rng & Units 09/14/2022    8:58 AM 08/31/2022    9:04 AM 08/26/2022   10:13 AM  CMP  Glucose 70 - 99 mg/dL 94  409  811   BUN 8 - 23 mg/dL 34  37  29   Creatinine 0.44 - 1.00 mg/dL 9.14  7.82  9.56   Sodium 135 - 145 mmol/L 135  135  135   Potassium 3.5 - 5.1 mmol/L 4.4  3.6  4.4   Chloride 98 - 111 mmol/L 106  105  104   CO2 22 - 32 mmol/L 26  21  25    Calcium 8.9 - 10.3 mg/dL 9.4  21.3  9.4   Total Protein 6.5 - 8.1 g/dL 6.2  6.6  6.4   Total Bilirubin 0.3 - 1.2 mg/dL 0.5  0.7  0.6   Alkaline Phos 38 - 126 U/L 250  331  252   AST 15 - 41 U/L 48  67  48   ALT 0 - 44 U/L 72  178  74      No results found for: "CEA1", "CEA" / No results found for: "CEA1", "CEA" No results found for: "PSA1" Lab Results  Component Value Date   VQQ595 381 (H) 09/14/2022   No results found for: "GLO756"  No results found for: "TOTALPROTELP", "ALBUMINELP", "A1GS", "A2GS", "BETS", "BETA2SER", "GAMS", "MSPIKE", "SPEI" Lab Results  Component Value Date   TIBC 438 06/18/2022   FERRITIN 32 06/18/2022   IRONPCTSAT 13 06/18/2022   No results found for: "LDH"   STUDIES:   No results found.

## 2022-09-14 ENCOUNTER — Ambulatory Visit: Payer: Medicare HMO | Admitting: Dietician

## 2022-09-14 ENCOUNTER — Encounter: Payer: Self-pay | Admitting: Hematology

## 2022-09-14 ENCOUNTER — Inpatient Hospital Stay: Payer: Medicare HMO

## 2022-09-14 ENCOUNTER — Inpatient Hospital Stay (HOSPITAL_BASED_OUTPATIENT_CLINIC_OR_DEPARTMENT_OTHER): Payer: Medicare HMO | Admitting: Hematology

## 2022-09-14 VITALS — BP 152/69 | HR 83 | Temp 97.5°F | Resp 18

## 2022-09-14 DIAGNOSIS — Z95828 Presence of other vascular implants and grafts: Secondary | ICD-10-CM

## 2022-09-14 DIAGNOSIS — C259 Malignant neoplasm of pancreas, unspecified: Secondary | ICD-10-CM

## 2022-09-14 DIAGNOSIS — Z5111 Encounter for antineoplastic chemotherapy: Secondary | ICD-10-CM | POA: Diagnosis not present

## 2022-09-14 LAB — COMPREHENSIVE METABOLIC PANEL
ALT: 72 U/L — ABNORMAL HIGH (ref 0–44)
AST: 48 U/L — ABNORMAL HIGH (ref 15–41)
Albumin: 3.3 g/dL — ABNORMAL LOW (ref 3.5–5.0)
Alkaline Phosphatase: 250 U/L — ABNORMAL HIGH (ref 38–126)
Anion gap: 3 — ABNORMAL LOW (ref 5–15)
BUN: 34 mg/dL — ABNORMAL HIGH (ref 8–23)
CO2: 26 mmol/L (ref 22–32)
Calcium: 9.4 mg/dL (ref 8.9–10.3)
Chloride: 106 mmol/L (ref 98–111)
Creatinine, Ser: 1.12 mg/dL — ABNORMAL HIGH (ref 0.44–1.00)
GFR, Estimated: 48 mL/min — ABNORMAL LOW (ref >60)
Glucose, Bld: 94 mg/dL (ref 70–99)
Potassium: 4.4 mmol/L (ref 3.5–5.1)
Sodium: 135 mmol/L (ref 135–145)
Total Bilirubin: 0.5 mg/dL (ref 0.3–1.2)
Total Protein: 6.2 g/dL — ABNORMAL LOW (ref 6.5–8.1)

## 2022-09-14 LAB — CBC WITH DIFFERENTIAL/PLATELET
Abs Immature Granulocytes: 0.02 10*3/uL (ref 0.00–0.07)
Basophils Absolute: 0 10*3/uL (ref 0.0–0.1)
Basophils Relative: 1 %
Eosinophils Absolute: 0.2 10*3/uL (ref 0.0–0.5)
Eosinophils Relative: 4 %
HCT: 33 % — ABNORMAL LOW (ref 36.0–46.0)
Hemoglobin: 10.6 g/dL — ABNORMAL LOW (ref 12.0–15.0)
Immature Granulocytes: 0 %
Lymphocytes Relative: 22 %
Lymphs Abs: 1.3 10*3/uL (ref 0.7–4.0)
MCH: 30.5 pg (ref 26.0–34.0)
MCHC: 32.1 g/dL (ref 30.0–36.0)
MCV: 94.8 fL (ref 80.0–100.0)
Monocytes Absolute: 0.7 10*3/uL (ref 0.1–1.0)
Monocytes Relative: 12 %
Neutro Abs: 3.5 10*3/uL (ref 1.7–7.7)
Neutrophils Relative %: 61 %
Platelets: 244 10*3/uL (ref 150–400)
RBC: 3.48 MIL/uL — ABNORMAL LOW (ref 3.87–5.11)
RDW: 18 % — ABNORMAL HIGH (ref 11.5–15.5)
WBC: 5.7 10*3/uL (ref 4.0–10.5)
nRBC: 0 % (ref 0.0–0.2)

## 2022-09-14 LAB — MAGNESIUM: Magnesium: 2.1 mg/dL (ref 1.7–2.4)

## 2022-09-14 MED ORDER — SODIUM CHLORIDE 0.9 % IV SOLN: INTRAVENOUS | Status: DC

## 2022-09-14 MED ORDER — DEXTROSE 5 % IV SOLN: Freq: Once | INTRAVENOUS | Status: AC

## 2022-09-14 MED ORDER — ATROPINE SULFATE 1 MG/ML IV SOLN
0.5000 mg | Freq: Once | INTRAVENOUS | Status: AC | PRN
  Administered 2022-09-14: 0.5 mg via INTRAVENOUS
  Filled 2022-09-14: qty 1

## 2022-09-14 MED ORDER — SODIUM CHLORIDE 0.9 % IV SOLN
10.0000 mg | Freq: Once | INTRAVENOUS | Status: AC
  Administered 2022-09-14: 10 mg via INTRAVENOUS
  Filled 2022-09-14: qty 10

## 2022-09-14 MED ORDER — OXALIPLATIN CHEMO INJECTION 100 MG/20ML
68.0000 mg/m2 | Freq: Once | INTRAVENOUS | Status: AC
  Administered 2022-09-14: 100 mg via INTRAVENOUS
  Filled 2022-09-14: qty 20

## 2022-09-14 MED ORDER — SODIUM CHLORIDE 0.9 % IV SOLN
150.0000 mg | Freq: Once | INTRAVENOUS | Status: AC
  Administered 2022-09-14: 150 mg via INTRAVENOUS
  Filled 2022-09-14: qty 150

## 2022-09-14 MED ORDER — PALONOSETRON HCL INJECTION 0.25 MG/5ML
0.2500 mg | Freq: Once | INTRAVENOUS | Status: AC
  Administered 2022-09-14: 0.25 mg via INTRAVENOUS
  Filled 2022-09-14: qty 5

## 2022-09-14 MED ORDER — SODIUM CHLORIDE 0.9 % IV SOLN
1920.0000 mg/m2 | INTRAVENOUS | Status: DC
  Administered 2022-09-14: 3000 mg via INTRAVENOUS
  Filled 2022-09-14: qty 60

## 2022-09-14 MED ORDER — SODIUM CHLORIDE 0.9 % IV SOLN
320.0000 mg/m2 | Freq: Once | INTRAVENOUS | Status: AC
  Administered 2022-09-14: 500 mg via INTRAVENOUS
  Filled 2022-09-14: qty 25

## 2022-09-14 MED ORDER — SODIUM CHLORIDE 0.9% FLUSH
10.0000 mL | Freq: Once | INTRAVENOUS | Status: AC
  Administered 2022-09-14: 10 mL via INTRAVENOUS

## 2022-09-14 MED ORDER — SODIUM CHLORIDE 0.9 % IV SOLN
120.0000 mg/m2 | Freq: Once | INTRAVENOUS | Status: AC
  Administered 2022-09-14: 200 mg via INTRAVENOUS
  Filled 2022-09-14: qty 10

## 2022-09-14 NOTE — Patient Instructions (Signed)
[facility]  Discharge Instructions: Thank you for choosing [facility] to provide your oncology and hematology care.  If you have a lab appointment with the Cancer Center - please note that after April 8th, 2024, all labs will be drawn in the cancer center.  You do not have to check in or register with the main entrance as you have in the past but will complete your check-in in the cancer center.  Wear comfortable clothing and clothing appropriate for easy access to any Portacath or PICC line.   We strive to give you quality time with your provider. You may need to reschedule your appointment if you arrive late (15 or more minutes).  Arriving late affects you and other patients whose appointments are after yours.  Also, if you miss three or more appointments without notifying the office, you may be dismissed from the clinic at the provider's discretion.      For prescription refill requests, have your pharmacy contact our office and allow 72 hours for refills to be completed.    Today you received the following chemotherapy and/or immunotherapy agents Folfirinox   To help prevent nausea and vomiting after your treatment, we encourage you to take your nausea medication as directed.  BELOW ARE SYMPTOMS THAT SHOULD BE REPORTED IMMEDIATELY: *FEVER GREATER THAN 100.4 F (38 C) OR HIGHER *CHILLS OR SWEATING *NAUSEA AND VOMITING THAT IS NOT CONTROLLED WITH YOUR NAUSEA MEDICATION *UNUSUAL SHORTNESS OF BREATH *UNUSUAL BRUISING OR BLEEDING *URINARY PROBLEMS (pain or burning when urinating, or frequent urination) *BOWEL PROBLEMS (unusual diarrhea, constipation, pain near the anus) TENDERNESS IN MOUTH AND THROAT WITH OR WITHOUT PRESENCE OF ULCERS (sore throat, sores in mouth, or a toothache) UNUSUAL RASH, SWELLING OR PAIN  UNUSUAL VAGINAL DISCHARGE OR ITCHING   Items with * indicate a potential emergency and should be followed up as soon as possible or go to the  Emergency Department if any problems should occur.  Please show the CHEMOTHERAPY ALERT CARD or IMMUNOTHERAPY ALERT CARD at check-in to the Emergency Department and triage nurse.  Should you have questions after your visit or need to cancel or reschedule your appointment, please contact [facility] [phone]  and follow the prompts.  Office hours are 8:00 a.m. to 4:30 p.m. Monday - Friday. Please note that voicemails left after 4:00 p.m. may not be returned until the following business day.  We are closed weekends and major holidays. You have access to a nurse at all times for urgent questions. Please call the main number to the clinic [phone] and follow the prompts.  For any non-urgent questions, you may also contact your provider using MyChart. We now offer e-Visits for anyone 3 and older to request care online for non-urgent symptoms. For details visit [email or web address].   Also download the MyChart app! Go to the app store, search "MyChart", open the app, select [place], and log in with your MyChart username and password.

## 2022-09-14 NOTE — Patient Instructions (Addendum)
[facility] Discharge Instructions   You were seen and examined today by Dr. Ellin Saba.  He reviewed the results of your lab work which are normal/stable.   We will proceed with your treatment today.   We will arrange for you to have an appointment for genetics counseling.   Return as scheduled.    Thank you for choosing  [facility] Center at [facility] to provide your oncology and hematology care.  To afford each patient quality time with our provider, please arrive at least 15 minutes before your scheduled appointment time.   If you have a lab appointment with the Cancer Center please come in thru the Main Entrance and check in at the main information desk.  You need to re-schedule your appointment should you arrive 10 or more minutes late.  We strive to give you quality time with our providers, and arriving late affects you and other patients whose appointments are after yours.  Also, if you no show three or more times for appointments you may be dismissed from the clinic at the providers discretion.     Again, thank you for choosing [facility].  Our hope is that these requests will decrease the amount of time that you wait before being seen by our physicians.       _____________________________________________________________  Should you have questions after your visit to [facility], please contact our office at [phone] and follow the prompts.  Our office hours are 8:00 a.m. and 4:30 p.m. Monday - Friday.  Please note that voicemails left after 4:00 p.m. may not be returned until the following business day.  We are closed weekends and major holidays.  You do have access to a nurse 24-7, just call the main number to the clinic [phone] and do not press any options, hold on the line and a nurse will answer the phone.    For prescription refill requests, have your pharmacy contact our office and  allow 72 hours.    Due to Covid, you will need to wear a mask upon entering the hospital. If you do not have a mask, a mask will be given to you at the Main Entrance upon arrival. For doctor visits, patients may have 1 support person age 11 or older with them. For treatment visits, patients can not have anyone with them due to social distancing guidelines and our immunocompromised population.

## 2022-09-14 NOTE — Progress Notes (Signed)
Patient presents today for Folfirinox infusion. Patient is in satisfactory condition with no new complaints voiced.  Vital signs are stable.  Labs reviewed by Dr. Ellin Saba during the office visit and all labs are within treatment parameters.  We will proceed with treatment per MD orders.   Folirinox given today per MD orders. Tolerated infusion without adverse affects. Vital signs stable. No complaints at this time. Discharged from clinic via wheelchair in stable condition. Alert and oriented x 3. F/U with [facility] as scheduled. 5FU ambulatory pump infusing.

## 2022-09-14 NOTE — Progress Notes (Signed)
Nutrition Follow-up:  Patient with pancreatic adenocarcinoma. She is currently receiving modified Folfirinox q14d.   Met with patient during infusion. She reports overall doing well. Patient reports increased fatigue lasting a 2-3 days after therapy. Her appetite is good. Reports 3 good meals daily. Patient says MD has suggested pt eat 5 small meals daily. States she will be cooking all day if she does this. Patient has been drinking Boost Plus (360 kcal, 14 g) twice daily or one Ensure Max (150 kcal, 30 g). Patient receives Boost at no cost via insurance company when ordering at [facility]. She is asking which is better for her to drink. She denies nausea, vomiting, diarrhea, constipation.    Medications: reviewed   Labs: BUN 34, Cr 1.2, albumin 3.3  Anthropometrics: Wt 115 lb today   3/27 - 117 lb 9.6 oz 2/14 - 117 lb 6.4 oz 1/18 - 114 lb 11.2 oz    NUTRITION DIAGNOSIS: Unintentional weight loss continues    INTERVENTION:  Suggested drinking Boost Plus twice daily in between meals as pt receives these at no cost via insurance      MONITORING, EVALUATION, GOAL: weight trends, intake    NEXT VISIT: Monday May 13 during infusion

## 2022-09-15 ENCOUNTER — Other Ambulatory Visit: Payer: Self-pay

## 2022-09-15 LAB — CANCER ANTIGEN 19-9: CA 19-9: 381 U/mL — ABNORMAL HIGH (ref 0–35)

## 2022-09-16 ENCOUNTER — Inpatient Hospital Stay: Payer: Medicare HMO

## 2022-09-16 VITALS — BP 147/64 | HR 65 | Temp 98.5°F | Resp 18

## 2022-09-16 DIAGNOSIS — Z5111 Encounter for antineoplastic chemotherapy: Secondary | ICD-10-CM | POA: Diagnosis not present

## 2022-09-16 DIAGNOSIS — Z95828 Presence of other vascular implants and grafts: Secondary | ICD-10-CM

## 2022-09-16 DIAGNOSIS — C259 Malignant neoplasm of pancreas, unspecified: Secondary | ICD-10-CM

## 2022-09-16 MED ORDER — SODIUM CHLORIDE 0.9% FLUSH
10.0000 mL | INTRAVENOUS | Status: DC | PRN
  Administered 2022-09-16: 10 mL

## 2022-09-16 MED ORDER — HEPARIN SOD (PORK) LOCK FLUSH 100 UNIT/ML IV SOLN
500.0000 [IU] | Freq: Once | INTRAVENOUS | Status: AC | PRN
  Administered 2022-09-16: 500 [IU]

## 2022-09-16 NOTE — Patient Instructions (Signed)
[facility]  Discharge Instructions: Thank you for choosing [facility] to provide your oncology and hematology care.  If you have a lab appointment with the Cancer Center - please note that after April 8th, 2024, all labs will be drawn in the cancer center.  You do not have to check in or register with the main entrance as you have in the past but will complete your check-in in the cancer center.  Wear comfortable clothing and clothing appropriate for easy access to any Portacath or PICC line.   We strive to give you quality time with your provider. You may need to reschedule your appointment if you arrive late (15 or more minutes).  Arriving late affects you and other patients whose appointments are after yours.  Also, if you miss three or more appointments without notifying the office, you may be dismissed from the clinic at the provider's discretion.      For prescription refill requests, have your pharmacy contact our office and allow 72 hours for refills to be completed.  To help prevent nausea and vomiting after your treatment, we encourage you to take your nausea medication as directed.  BELOW ARE SYMPTOMS THAT SHOULD BE REPORTED IMMEDIATELY: *FEVER GREATER THAN 100.4 F (38 C) OR HIGHER *CHILLS OR SWEATING *NAUSEA AND VOMITING THAT IS NOT CONTROLLED WITH YOUR NAUSEA MEDICATION *UNUSUAL SHORTNESS OF BREATH *UNUSUAL BRUISING OR BLEEDING *URINARY PROBLEMS (pain or burning when urinating, or frequent urination) *BOWEL PROBLEMS (unusual diarrhea, constipation, pain near the anus) TENDERNESS IN MOUTH AND THROAT WITH OR WITHOUT PRESENCE OF ULCERS (sore throat, sores in mouth, or a toothache) UNUSUAL RASH, SWELLING OR PAIN  UNUSUAL VAGINAL DISCHARGE OR ITCHING   Items with * indicate a potential emergency and should be followed up as soon as possible or go to the Emergency Department if any problems should occur.  Please show the CHEMOTHERAPY ALERT CARD or  IMMUNOTHERAPY ALERT CARD at check-in to the Emergency Department and triage nurse.  Should you have questions after your visit or need to cancel or reschedule your appointment, please contact [facility] [phone]  and follow the prompts.  Office hours are 8:00 a.m. to 4:30 p.m. Monday - Friday. Please note that voicemails left after 4:00 p.m. may not be returned until the following business day.  We are closed weekends and major holidays. You have access to a nurse at all times for urgent questions. Please call the main number to the clinic [phone] and follow the prompts.  For any non-urgent questions, you may also contact your provider using MyChart. We now offer e-Visits for anyone 18 and older to request care online for non-urgent symptoms. For details visit [email or web address].   Also download the MyChart app! Go to the app store, search "MyChart", open the app, select [facility], and log in with your MyChart username and password.   

## 2022-09-16 NOTE — Progress Notes (Signed)
Patients port flushed without difficulty.  Good blood return noted with no bruising or swelling noted at site.  Home infusion 5FU pump disconnected with no issues.  Band aid applied.  VSS with discharge and left in satisfactory condition with no s/s of distress noted.   

## 2022-09-24 ENCOUNTER — Other Ambulatory Visit: Payer: Self-pay

## 2022-09-24 MED ORDER — MEGESTROL ACETATE 400 MG/10ML PO SUSP: 400.0000 mg | Freq: Two times a day (BID) | ORAL | 0 refills | Status: DC

## 2022-09-24 NOTE — Telephone Encounter (Signed)
Patient's daughter called reporting that the patient has had nausea and diarrhea periodically since her last treatment. These symptoms are controlled with compazine and imodium. Meagan [name] also reports a few pounds weight loss and not eating well. Meagan [name] tells me that the patient gets extremely fatigued after 10 minutes of daily activities and has to lay down. Patient is supplementing with Ensure/Boost. Discussed with Dr. Ellin Saba and order received for Megace  BID. Patient's daughter aware and agreeable. Patient remains scheduled to see Dr. Ellin Saba on Monday, 04/29.

## 2022-09-25 ENCOUNTER — Encounter: Payer: Self-pay | Admitting: Hematology

## 2022-09-27 NOTE — Progress Notes (Signed)
[facility] [address], [place] [address]    Clinic Day:  09/28/2022  Referring physician: [name], [name]  Patient Care Team: [name], [name] as PCP - General (Family Medicine) [name], MD as Medical Oncologist (Medical Oncology) [name], RN as Oncology Nurse Navigator (Medical Oncology)   ASSESSMENT & PLAN:   Assessment: 1. T2 N0 M0 pancreatic adenocarcinoma: - She presented with painless jaundice in April 2023.  Weight loss of 13 pounds since April. - Stent placement on 09/10/2021 for a bilirubin of 8.2. - 10/03/2021: ERCP and stent removal and stent placement for stent dysfunction with total bilirubin 16.3. - 10/29/2021: ERCP/EUS: Lower CBD stent stenosis from stricture, status post metal stent placement.  EUS showed mass in the superior pancreatic head measuring 2.8 x 2.2 cm, suggesting abutment of the portal vein.  Intact interface was seen between lesion and SMA and celiac trunk suggesting lack of invasion.  Hypoechoic 14 x 12 mm lesion visualized in the liver.  Biopsy could not be attempted due to presence of blood vessel. - She had syncopal episodes in 06/18/22, had pacemaker placement for bradycardia. - Pathology: Pancreatic head FNA and biliary stricture brushing: Malignant cells consistent with adenocarcinoma. - CA 19-9: 131 - We reviewed MRI of the abdomen with and without contrast (12/24/2021): Pancreatic head mass measures 2.7 x 2.3 cm (2.5 x 1.8 cm previously).  Mass is abutting and slightly displacing the SMA.  No obvious fat plane is demonstrated.  Portal and splenic veins are patent.  Stable small scattered hepatic cysts with no liver metastatic disease. - PET scan (11/20/2021): Ill-defined low-level hypermetabolic activity about the metallic CBD stent and pancreatic uncinate process without discrete CT correlate.  No convincing evidence of hypermetabolic disease in the neck, chest, abdomen or pelvis.  Tiny bilateral  lung nodules measuring up to 3 mm.  -CT pancreatic protocol (01/16/2022): Hypoenhancing mass in the pancreatic head measuring 2.9 x 1.8 cm.  Slightly greater than 180 degrees of abutment with portal vein as well as SMA without definite vascular encasement.  New enhancing 9 mm soft tissue nodule along the posterior aspect of the gallbladder wall?  Suspicious for peritoneal implant. -I have discussed with Dr. Freida Busman who has seen this patient. -Dr. Freida Busman thought that this patient might not be a candidate for surgery but she would like to evaluate after 3 to 4 months of neoadjuvant chemotherapy. -We talked about neoadjuvant chemotherapy.  She is not a candidate for FOLFIRINOX.  I have recommended gemcitabine and Abraxane every other week.  Cycle 1 was started on 02/05/2022.  - I have discussed NGS test results.  Guardant360 did not show MSI high.  No other targetable mutations.  Limited tissue NGS testing did not show any targetable mutations.  MS-stable.  TMB-low.   2. Social/family history: - She lives at home with her husband who has dementia.  She worked as a Conservation officer, nature for 33 years.  Non-smoker and no exposure to chemicals.  She has not been driving since June 18, 2022. - Daughter died of glioblastoma.  Mother had ovarian cancer.  Maternal aunt had breast cancer.    Plan: T2 N0 M0 pancreatic adenocarcinoma: - Cycle 2 of FOLFIRINOX with 80% dose reduction on 09/14/2022. - She felt very tired and only had 3 good days in the last 2 weeks. - She had occasional diarrhea 2-3 times and took half pill of Imodium which helped.  She had nausea but no vomiting. - Reviewed labs today: Alk  phos 254 and ALT of 100 with normal bilirubin.  Creatinine is 1.28.  CBC grossly normal.  Last CEA 19-9 was 381, down from 385. - As she has felt weak, I have recommended holding Irinotecan and just give FOLFOX with 20% dose reduction today.  RTC 2 weeks for follow-up. - Will consider proceeding with 6 cycles of FOLFIRINOX  followed by restaging scan.  If she gets good response, will consider chemoradiation therapy with Xeloda.  2.  Weight loss: - She has lost 1 pound since last visit.  She started taking Megace 400 mg twice daily on 09/27/2022.   3.  Normocytic anemia: - Last Venofer 400 mg on 07/31/2022.  Hemoglobin today is 10.7.    No orders of the defined types were placed in this encounter.     I,Katie Daubenspeck,acting as a Neurosurgeon for [name], MD.,have documented all relevant documentation on the behalf of [name], MD,as directed by  [name], MD while in the presence of [name], MD.   I, [name] MD, have reviewed the above documentation for accuracy and completeness, and I agree with the above.   [name], MD   4/29/20245:04 PM  CHIEF COMPLAINT:   Diagnosis: pancreatic adenocarcinoma    Cancer Staging  Pancreatic carcinoma [facility]) Staging form: Exocrine Pancreas, AJCC 8th Edition - Clinical stage from 11/11/2021: Stage IB (cT2, cN0, cM0) - Unsigned    Prior Therapy: Gemcitabine and Abraxane   Current Therapy:  FOLFOX    HISTORY OF PRESENT ILLNESS:   Oncology History  Pancreatic carcinoma (HCC)  11/11/2021 Initial Diagnosis   Pancreatic carcinoma (HCC)   02/05/2022 - 07/02/2022 Chemotherapy   Patient is on Treatment Plan : PANCREATIC Abraxane D1,15 + Gemcitabine D1,15 q28d     08/31/2022 -  Chemotherapy   Patient is on Treatment Plan : PANCREAS Modified FOLFIRINOX q14d x 4 cycles        INTERVAL HISTORY:   Meagan [name] is a 88 y.o. female presenting to clinic today for follow up of pancreatic adenocarcinoma. She was last seen by me on 09/14/22.  Of note since her last visit, she met with Dr. Langston Masker to discuss potential radiation oncology. She is being referred to radiation oncology in [place], [place], which is closer to her home.   Today, she states that she is doing well overall. Her appetite level is at 60%. Her energy  level is at 50%.  PAST MEDICAL HISTORY:   Past Medical History: Past Medical History:  Diagnosis Date   Essential tremor    GERD (gastroesophageal reflux disease)    High cholesterol    Hypertension    PONV (postoperative nausea and vomiting)    Port-A-Cath in place 01/27/2022   Vertigo     Surgical History: Past Surgical History:  Procedure Laterality Date   BILIARY BRUSHING  09/10/2021   Procedure: BILIARY BRUSHING;  Surgeon: Hilarie Fredrickson, MD;  Location: [facility] ENDOSCOPY;  Service: Gastroenterology;;   BILIARY BRUSHING  10/29/2021   Procedure: BILIARY BRUSHING;  Surgeon: Lemar Lofty., MD;  Location: [facility] ENDOSCOPY;  Service: Gastroenterology;;   BILIARY DILATION  10/29/2021   Procedure: BILIARY DILATION;  Surgeon: Lemar Lofty., MD;  Location: [facility] ENDOSCOPY;  Service: Gastroenterology;;   BILIARY STENT PLACEMENT  09/10/2021   Procedure: BILIARY STENT PLACEMENT;  Surgeon: Hilarie Fredrickson, MD;  Location: [facility] ENDOSCOPY;  Service: Gastroenterology;;   BILIARY STENT PLACEMENT N/A 10/03/2021   Procedure: BILIARY STENT PLACEMENT;  Surgeon: Iva Boop, MD;  Location: [facility] ENDOSCOPY;  Service: Gastroenterology;  Laterality: N/A;   BILIARY STENT PLACEMENT N/A 10/29/2021   Procedure: BILIARY STENT PLACEMENT;  Surgeon: Meridee Score Netty Starring., MD;  Location: [facility] ENDOSCOPY;  Service: Gastroenterology;  Laterality: N/A;   BILIARY STENT PLACEMENT N/A 05/08/2022   Procedure: BILIARY STENT PLACEMENT;  Surgeon: Iva Boop, MD;  Location: [facility] ENDOSCOPY;  Service: Gastroenterology;  Laterality: N/A;   BIOPSY  10/29/2021   Procedure: BIOPSY;  Surgeon: Meridee Score Netty Starring., MD;  Location: [facility] ENDOSCOPY;  Service: Gastroenterology;;   ENDOSCOPIC RETROGRADE CHOLANGIOPANCREATOGRAPHY (ERCP) WITH PROPOFOL N/A 10/29/2021   Procedure: ENDOSCOPIC RETROGRADE CHOLANGIOPANCREATOGRAPHY (ERCP) WITH PROPOFOL;  Surgeon: Lemar Lofty., MD;  Location: [facility] ENDOSCOPY;  Service: Gastroenterology;   Laterality: N/A;   ERCP N/A 09/10/2021   Procedure: ENDOSCOPIC RETROGRADE CHOLANGIOPANCREATOGRAPHY (ERCP);  Surgeon: Hilarie Fredrickson, MD;  Location: [facility] ENDOSCOPY;  Service: Gastroenterology;  Laterality: N/A;   ERCP N/A 10/03/2021   Procedure: ENDOSCOPIC RETROGRADE CHOLANGIOPANCREATOGRAPHY (ERCP);  Surgeon: Iva Boop, MD;  Location: [facility] ENDOSCOPY;  Service: Gastroenterology;  Laterality: N/A;   ERCP N/A 05/08/2022   Procedure: ENDOSCOPIC RETROGRADE CHOLANGIOPANCREATOGRAPHY (ERCP);  Surgeon: Iva Boop, MD;  Location: [facility] ENDOSCOPY;  Service: Gastroenterology;  Laterality: N/A;   ESOPHAGOGASTRODUODENOSCOPY (EGD) WITH PROPOFOL N/A 10/29/2021   Procedure: ESOPHAGOGASTRODUODENOSCOPY (EGD) WITH PROPOFOL;  Surgeon: Meridee Score Netty Starring., MD;  Location: [facility] ENDOSCOPY;  Service: Gastroenterology;  Laterality: N/A;   EUS N/A 10/29/2021   Procedure: UPPER ENDOSCOPIC ULTRASOUND (EUS) RADIAL;  Surgeon: Lemar Lofty., MD;  Location: [facility] ENDOSCOPY;  Service: Gastroenterology;  Laterality: N/A;   FINE NEEDLE ASPIRATION  10/29/2021   Procedure: FINE NEEDLE ASPIRATION (FNA) LINEAR;  Surgeon: Lemar Lofty., MD;  Location: [facility] ENDOSCOPY;  Service: Gastroenterology;;   IR IMAGING GUIDED PORT INSERTION  01/28/2022   PACEMAKER IMPLANT     REMOVAL OF STONES  10/29/2021   Procedure: REMOVAL OF SLUDGE;  Surgeon: Lemar Lofty., MD;  Location: [facility] ENDOSCOPY;  Service: Gastroenterology;;   Dennison Mascot  09/10/2021   Procedure: Dennison Mascot;  Surgeon: Hilarie Fredrickson, MD;  Location: [facility] ENDOSCOPY;  Service: Gastroenterology;;   [facility] CHOLANGIOSCOPY N/A 10/29/2021   Procedure: [id number] CHOLANGIOSCOPY;  Surgeon: Lemar Lofty., MD;  Location: [facility] ENDOSCOPY;  Service: Gastroenterology;  Laterality: N/A;   STENT REMOVAL  10/03/2021   Procedure: STENT REMOVAL;  Surgeon: Iva Boop, MD;  Location: [facility] ENDOSCOPY;  Service: Gastroenterology;;    Social History: Social History    Socioeconomic History   Marital status: Widowed    Spouse name: Not on file   Number of children: Not on file   Years of education: Not on file   Highest education level: Not on file  Occupational History   Not on file  Tobacco Use   Smoking status: Never   Smokeless tobacco: Never  Vaping Use   Vaping Use: Never used  Substance and Sexual Activity   Alcohol use: Never   Drug use: Never   Sexual activity: Not on file  Other Topics Concern   Not on file  Social History Narrative   Not on file   Social Determinants of Health   Financial Resource Strain: Not on file  Food Insecurity: No Food Insecurity (05/07/2022)   Hunger Vital Sign    Worried About Running Out of Food in the Last Year: Never true    Ran Out of Food in the Last Year: Never true  Transportation Needs: No Transportation Needs (05/07/2022)   PRAPARE - Administrator, Civil Service (Medical): No  Lack of Transportation (Non-Medical): No  Physical Activity: Not on file  Stress: Not on file  Social Connections: Not on file  Intimate Partner Violence: Not At Risk (05/07/2022)   Humiliation, Afraid, Rape, and Kick questionnaire    Fear of Current or Ex-Partner: No    Emotionally Abused: No    Physically Abused: No    Sexually Abused: No    Family History: Family History  Problem Relation Age of Onset   Stomach cancer Neg Hx    Esophageal cancer Neg Hx    Colon cancer Neg Hx    Pancreatic cancer Neg Hx     Current Medications:  Current Outpatient Medications:    amLODipine (NORVASC) 5 MG tablet, 1 tab(s) orally once a day for 90 days, Disp: , Rfl:    aspirin EC 81 MG tablet, Take 81 mg by mouth daily. Swallow whole., Disp: , Rfl:    Cholecalciferol (VITAMIN D3) 50 MCG (2000 UT) TABS, Take 2,000 Units by mouth daily after supper., Disp: , Rfl:    Coenzyme Q10 (CO Q-10) 100 MG CAPS, Take 100 mg by mouth every evening., Disp: , Rfl:    famotidine (PEPCID) 40 MG tablet, Take 40 mg by mouth  every evening., Disp: , Rfl:    gabapentin (NEURONTIN) 100 MG capsule, Take 100 mg by mouth 2 (two) times daily., Disp: , Rfl:    meclizine (ANTIVERT) 25 MG tablet, Take 25 mg by mouth daily as needed for dizziness., Disp: , Rfl:    megestrol (MEGACE) 400 MG/10ML suspension, Take 10 mLs (400 mg total) by mouth 2 (two) times daily., Disp: 480 mL, Rfl: 0   Misc Natural Products (JOINT SUPPORT) CAPS, Take 1 capsule by mouth daily with breakfast., Disp: , Rfl:    Multiple Vitamin (MULTIVITAMIN) tablet, Take 1 tablet by mouth daily with breakfast., Disp: , Rfl:    Multiple Vitamins-Minerals (OCUVITE EYE HEALTH FORMULA) CAPS, Take 1 capsule by mouth daily., Disp: , Rfl:    nebivolol (BYSTOLIC) 5 MG tablet, Take 2.5 mg by mouth in the morning., Disp: , Rfl:    omeprazole (PRILOSEC) 40 MG capsule, Take 40 mg by mouth daily before breakfast., Disp: , Rfl:    polyethylene glycol (MIRALAX) 17 g packet, Take 17 g by mouth daily as needed. (Patient taking differently: Take 17 g by mouth daily as needed for moderate constipation.), Disp: 14 each, Rfl: 0   Polyvinyl Alcohol-Povidone (REFRESH OP), Place 1 drop into both eyes daily as needed (tired eyes)., Disp: , Rfl:    predniSONE (DELTASONE) 50 MG tablet, Take 13 hours, 7 hours and 1 hour prior to CT scan, Disp: 3 tablet, Rfl: 0   primidone (MYSOLINE) 50 MG tablet, Take 25 mg by mouth daily as needed (for tremors)., Disp: , Rfl:    pyridOXINE (VITAMIN B-6) 100 MG tablet, Take 50 mg by mouth in the morning., Disp: , Rfl:    tiZANidine (ZANAFLEX) 2 MG tablet, Take 1 mg by mouth daily as needed for muscle spasms., Disp: , Rfl:    diphenhydrAMINE (BENADRYL) 50 MG tablet, Take 1 tablet (50 mg total) by mouth once for 1 dose. Take 1 hour prior to CT scan, Disp: 1 tablet, Rfl: 0 No current facility-administered medications for this visit.  Facility-Administered Medications Ordered in Other Visits:    fluorouracil (ADRUCIL) 3,000 mg in sodium chloride 0.9 % 90 mL chemo  infusion, 1,920 mg/m2 (Treatment Plan Recorded), Intravenous, 1 day or 1 dose, [name], MD, Infusion Verify at 09/28/22 1456  Allergies: Allergies  Allergen Reactions   Ivp Dye [Iodinated Contrast Media] Hives   Lisinopril Cough    REVIEW OF SYSTEMS:   Review of Systems  Constitutional:  Negative for chills, fatigue and fever.  HENT:   Negative for lump/mass, mouth sores, nosebleeds, sore throat and trouble swallowing.   Eyes:  Negative for eye problems.  Respiratory:  Negative for cough and shortness of breath.   Cardiovascular:  Negative for chest pain, leg swelling and palpitations.  Gastrointestinal:  Positive for constipation and diarrhea. Negative for abdominal pain, nausea and vomiting.  Genitourinary:  Negative for bladder incontinence, difficulty urinating, dysuria, frequency, hematuria and nocturia.   Musculoskeletal:  Negative for arthralgias, back pain, flank pain, myalgias and neck pain.  Skin:  Negative for itching and rash.  Neurological:  Negative for dizziness, headaches and numbness.  Hematological:  Does not bruise/bleed easily.  Psychiatric/Behavioral:  Negative for depression, sleep disturbance and suicidal ideas. The patient is not nervous/anxious.   All other systems reviewed and are negative.    VITALS:   Weight 113 lb 4.8 oz (51.4 kg).  Wt Readings from Last 3 Encounters:  09/28/22 113 lb 4.8 oz (51.4 kg)  09/14/22 115 lb (52.2 kg)  08/31/22 114 lb 9.6 oz (52 kg)    Body mass index is 18.29 kg/m.  Performance status (ECOG): 1 - Symptomatic but completely ambulatory  PHYSICAL EXAM:   Physical Exam Vitals and nursing note reviewed. Exam conducted with a chaperone present.  Constitutional:      Appearance: Normal appearance.  Cardiovascular:     Rate and Rhythm: Normal rate and regular rhythm.     Pulses: Normal pulses.     Heart sounds: Normal heart sounds.  Pulmonary:     Effort: Pulmonary effort is normal.     Breath sounds:  Normal breath sounds.  Abdominal:     Palpations: Abdomen is soft. There is no hepatomegaly, splenomegaly or mass.     Tenderness: There is no abdominal tenderness.  Musculoskeletal:     Right lower leg: No edema.     Left lower leg: No edema.  Lymphadenopathy:     Cervical: No cervical adenopathy.     Right cervical: No superficial, deep or posterior cervical adenopathy.    Left cervical: No superficial, deep or posterior cervical adenopathy.     Upper Body:     Right upper body: No supraclavicular or axillary adenopathy.     Left upper body: No supraclavicular or axillary adenopathy.  Neurological:     General: No focal deficit present.     Mental Status: She is alert and oriented to person, place, and time.  Psychiatric:        Mood and Affect: Mood normal.        Behavior: Behavior normal.     LABS:      Latest Ref Rng & Units 09/28/2022    9:34 AM 09/14/2022    8:58 AM 08/31/2022    9:04 AM  CBC  WBC 4.0 - 10.5 K/uL 6.2  5.7  6.6   Hemoglobin 12.0 - 15.0 g/dL 16.1  09.6  04.5   Hematocrit 36.0 - 46.0 % 33.3  33.0  34.5   Platelets 150 - 400 K/uL 230  244  242       Latest Ref Rng & Units 09/28/2022    9:34 AM 09/14/2022    8:58 AM 08/31/2022    9:04 AM  CMP  Glucose 70 - 99 mg/dL 409  94  154   BUN 8 - 23 mg/dL 38  34  37   Creatinine 0.44 - 1.00 mg/dL 1.61  0.96  0.45   Sodium 135 - 145 mmol/L 135  135  135   Potassium 3.5 - 5.1 mmol/L 4.3  4.4  3.6   Chloride 98 - 111 mmol/L 104  106  105   CO2 22 - 32 mmol/L 22  26  21    Calcium 8.9 - 10.3 mg/dL 9.6  9.4  40.9   Total Protein 6.5 - 8.1 g/dL 6.4  6.2  6.6   Total Bilirubin 0.3 - 1.2 mg/dL 0.6  0.5  0.7   Alkaline Phos 38 - 126 U/L 254  250  331   AST 15 - 41 U/L 37  48  67   ALT 0 - 44 U/L 100  72  178      No results found for: "CEA1", "CEA" / No results found for: "CEA1", "CEA" No results found for: "PSA1" Lab Results  Component Value Date   WJX914 381 (H) 09/14/2022   No results found for: "NWG956"   No results found for: "TOTALPROTELP", "ALBUMINELP", "A1GS", "A2GS", "BETS", "BETA2SER", "GAMS", "MSPIKE", "SPEI" Lab Results  Component Value Date   TIBC 438 06/18/2022   FERRITIN 32 06/18/2022   IRONPCTSAT 13 06/18/2022   No results found for: "LDH"   STUDIES:   No results found.

## 2022-09-28 ENCOUNTER — Inpatient Hospital Stay: Payer: Medicare HMO

## 2022-09-28 ENCOUNTER — Inpatient Hospital Stay (HOSPITAL_BASED_OUTPATIENT_CLINIC_OR_DEPARTMENT_OTHER): Payer: Medicare HMO | Admitting: Hematology

## 2022-09-28 DIAGNOSIS — Z95828 Presence of other vascular implants and grafts: Secondary | ICD-10-CM

## 2022-09-28 DIAGNOSIS — Z5111 Encounter for antineoplastic chemotherapy: Secondary | ICD-10-CM | POA: Diagnosis not present

## 2022-09-28 DIAGNOSIS — C259 Malignant neoplasm of pancreas, unspecified: Secondary | ICD-10-CM

## 2022-09-28 LAB — COMPREHENSIVE METABOLIC PANEL
ALT: 100 U/L — ABNORMAL HIGH (ref 0–44)
AST: 37 U/L (ref 15–41)
Albumin: 3.3 g/dL — ABNORMAL LOW (ref 3.5–5.0)
Alkaline Phosphatase: 254 U/L — ABNORMAL HIGH (ref 38–126)
Anion gap: 9 (ref 5–15)
BUN: 38 mg/dL — ABNORMAL HIGH (ref 8–23)
CO2: 22 mmol/L (ref 22–32)
Calcium: 9.6 mg/dL (ref 8.9–10.3)
Chloride: 104 mmol/L (ref 98–111)
Creatinine, Ser: 1.28 mg/dL — ABNORMAL HIGH (ref 0.44–1.00)
GFR, Estimated: 41 mL/min — ABNORMAL LOW (ref >60)
Glucose, Bld: 108 mg/dL — ABNORMAL HIGH (ref 70–99)
Potassium: 4.3 mmol/L (ref 3.5–5.1)
Sodium: 135 mmol/L (ref 135–145)
Total Bilirubin: 0.6 mg/dL (ref 0.3–1.2)
Total Protein: 6.4 g/dL — ABNORMAL LOW (ref 6.5–8.1)

## 2022-09-28 LAB — CBC WITH DIFFERENTIAL/PLATELET
Abs Immature Granulocytes: 0.01 10*3/uL (ref 0.00–0.07)
Basophils Absolute: 0 10*3/uL (ref 0.0–0.1)
Basophils Relative: 1 %
Eosinophils Absolute: 0.1 10*3/uL (ref 0.0–0.5)
Eosinophils Relative: 2 %
HCT: 33.3 % — ABNORMAL LOW (ref 36.0–46.0)
Hemoglobin: 10.7 g/dL — ABNORMAL LOW (ref 12.0–15.0)
Immature Granulocytes: 0 %
Lymphocytes Relative: 30 %
Lymphs Abs: 1.9 10*3/uL (ref 0.7–4.0)
MCH: 30.6 pg (ref 26.0–34.0)
MCHC: 32.1 g/dL (ref 30.0–36.0)
MCV: 95.1 fL (ref 80.0–100.0)
Monocytes Absolute: 0.9 10*3/uL (ref 0.1–1.0)
Monocytes Relative: 14 %
Neutro Abs: 3.3 10*3/uL (ref 1.7–7.7)
Neutrophils Relative %: 53 %
Platelets: 230 10*3/uL (ref 150–400)
RBC: 3.5 MIL/uL — ABNORMAL LOW (ref 3.87–5.11)
RDW: 17.9 % — ABNORMAL HIGH (ref 11.5–15.5)
WBC: 6.2 10*3/uL (ref 4.0–10.5)
nRBC: 0 % (ref 0.0–0.2)

## 2022-09-28 LAB — MAGNESIUM: Magnesium: 2 mg/dL (ref 1.7–2.4)

## 2022-09-28 MED ORDER — OXALIPLATIN CHEMO INJECTION 100 MG/20ML
68.0000 mg/m2 | Freq: Once | INTRAVENOUS | Status: AC
  Administered 2022-09-28: 100 mg via INTRAVENOUS
  Filled 2022-09-28: qty 20

## 2022-09-28 MED ORDER — PALONOSETRON HCL INJECTION 0.25 MG/5ML
0.2500 mg | Freq: Once | INTRAVENOUS | Status: AC
  Administered 2022-09-28: 0.25 mg via INTRAVENOUS
  Filled 2022-09-28: qty 5

## 2022-09-28 MED ORDER — LEUCOVORIN CALCIUM INJECTION 350 MG
320.0000 mg/m2 | Freq: Once | INTRAVENOUS | Status: AC
  Administered 2022-09-28: 500 mg via INTRAVENOUS
  Filled 2022-09-28: qty 25

## 2022-09-28 MED ORDER — DEXTROSE 5 % IV SOLN: Freq: Once | INTRAVENOUS | Status: AC

## 2022-09-28 MED ORDER — SODIUM CHLORIDE 0.9 % IV SOLN
1920.0000 mg/m2 | INTRAVENOUS | Status: DC
  Administered 2022-09-28: 3000 mg via INTRAVENOUS
  Filled 2022-09-28: qty 60

## 2022-09-28 MED ORDER — SODIUM CHLORIDE 0.9 % IV SOLN
10.0000 mg | Freq: Once | INTRAVENOUS | Status: AC
  Administered 2022-09-28: 10 mg via INTRAVENOUS
  Filled 2022-09-28: qty 10

## 2022-09-28 MED ORDER — SODIUM CHLORIDE 0.9 % IV SOLN
150.0000 mg | Freq: Once | INTRAVENOUS | Status: AC
  Administered 2022-09-28: 150 mg via INTRAVENOUS
  Filled 2022-09-28: qty 5

## 2022-09-28 NOTE — Patient Instructions (Signed)
[facility]  Discharge Instructions: Thank you for choosing [facility] to provide your oncology and hematology care.  If you have a lab appointment with the Cancer Center - please note that after April 8th, 2024, all labs will be drawn in the cancer center.  You do not have to check in or register with the main entrance as you have in the past but will complete your check-in in the cancer center.  Wear comfortable clothing and clothing appropriate for easy access to any Portacath or PICC line.   We strive to give you quality time with your provider. You may need to reschedule your appointment if you arrive late (15 or more minutes).  Arriving late affects you and other patients whose appointments are after yours.  Also, if you miss three or more appointments without notifying the office, you may be dismissed from the clinic at the provider's discretion.      For prescription refill requests, have your pharmacy contact our office and allow 72 hours for refills to be completed.  To help prevent nausea and vomiting after your treatment, we encourage you to take your nausea medication as directed.  BELOW ARE SYMPTOMS THAT SHOULD BE REPORTED IMMEDIATELY: *FEVER GREATER THAN 100.4 F (38 C) OR HIGHER *CHILLS OR SWEATING *NAUSEA AND VOMITING THAT IS NOT CONTROLLED WITH YOUR NAUSEA MEDICATION *UNUSUAL SHORTNESS OF BREATH *UNUSUAL BRUISING OR BLEEDING *URINARY PROBLEMS (pain or burning when urinating, or frequent urination) *BOWEL PROBLEMS (unusual diarrhea, constipation, pain near the anus) TENDERNESS IN MOUTH AND THROAT WITH OR WITHOUT PRESENCE OF ULCERS (sore throat, sores in mouth, or a toothache) UNUSUAL RASH, SWELLING OR PAIN  UNUSUAL VAGINAL DISCHARGE OR ITCHING   Items with * indicate a potential emergency and should be followed up as soon as possible or go to the Emergency Department if any problems should occur.  Please show the CHEMOTHERAPY ALERT CARD or  IMMUNOTHERAPY ALERT CARD at check-in to the Emergency Department and triage nurse.  Should you have questions after your visit or need to cancel or reschedule your appointment, please contact [facility] [phone]  and follow the prompts.  Office hours are 8:00 a.m. to 4:30 p.m. Monday - Friday. Please note that voicemails left after 4:00 p.m. may not be returned until the following business day.  We are closed weekends and major holidays. You have access to a nurse at all times for urgent questions. Please call the main number to the clinic [phone] and follow the prompts.  For any non-urgent questions, you may also contact your provider using MyChart. We now offer e-Visits for anyone 18 and older to request care online for non-urgent symptoms. For details visit [email or web address].   Also download the MyChart app! Go to the app store, search "MyChart", open the app, select [place], and log in with your MyChart username and password.   

## 2022-09-28 NOTE — Patient Instructions (Addendum)
[facility] Discharge Instructions   You were seen and examined today by Dr. Katragadda.  He reviewed the results of your lab work which are normal/stable.   We will proceed with your treatment today.  Return as scheduled.    Thank you for choosing [facility] to provide your oncology and hematology care.  To afford each patient quality time with our provider, please arrive at least 15 minutes before your scheduled appointment time.   If you have a lab appointment with the Cancer Center please come in thru the Main Entrance and check in at the main information desk.  You need to re-schedule your appointment should you arrive 10 or more minutes late.  We strive to give you quality time with our providers, and arriving late affects you and other patients whose appointments are after yours.  Also, if you no show three or more times for appointments you may be dismissed from the clinic at the providers discretion.     Again, thank you for choosing [facility].  Our hope is that these requests will decrease the amount of time that you wait before being seen by our physicians.       _____________________________________________________________  Should you have questions after your visit to [facility], please contact our office at [phone] and follow the prompts.  Our office hours are 8:00 a.m. and 4:30 p.m. Monday - Friday.  Please note that voicemails left after 4:00 p.m. may not be returned until the following business day.  We are closed weekends and major holidays.  You do have access to a nurse 24-7, just call the main number to the clinic [phone] and do not press any options, hold on the line and a nurse will answer the phone.    For prescription refill requests, have your pharmacy contact our office and allow 72 hours.    Due to Covid, you will need to wear a mask upon entering  the hospital. If you do not have a mask, a mask will be given to you at the Main Entrance upon arrival. For doctor visits, patients may have 1 support person age 18 or older with them. For treatment visits, patients can not have anyone with them due to social distancing guidelines and our immunocompromised population.      

## 2022-09-28 NOTE — Progress Notes (Signed)
Patient has been examined by Dr. Ellin Saba. Vital signs and labs have been reviewed by MD - ANC, Creatinine, LFTs (ALT 100), hemoglobin, and platelets are within treatment parameters per M.D. - pt may proceed with treatment.  Primary RN and pharmacy notified.

## 2022-09-28 NOTE — Progress Notes (Signed)
Holding irinotecan today and will infuse leucovorin with oxaliplatin over 2 hours.  T.O. Dr Carilyn Goodpasture, PharmD

## 2022-09-29 LAB — CANCER ANTIGEN 19-9: CA 19-9: 371 U/mL — ABNORMAL HIGH (ref 0–35)

## 2022-09-30 ENCOUNTER — Inpatient Hospital Stay: Payer: Medicare HMO | Attending: Hematology

## 2022-09-30 DIAGNOSIS — R918 Other nonspecific abnormal finding of lung field: Secondary | ICD-10-CM | POA: Insufficient documentation

## 2022-09-30 DIAGNOSIS — Z803 Family history of malignant neoplasm of breast: Secondary | ICD-10-CM | POA: Diagnosis not present

## 2022-09-30 DIAGNOSIS — D649 Anemia, unspecified: Secondary | ICD-10-CM | POA: Diagnosis not present

## 2022-09-30 DIAGNOSIS — Z95828 Presence of other vascular implants and grafts: Secondary | ICD-10-CM

## 2022-09-30 DIAGNOSIS — C250 Malignant neoplasm of head of pancreas: Secondary | ICD-10-CM | POA: Insufficient documentation

## 2022-09-30 DIAGNOSIS — Z808 Family history of malignant neoplasm of other organs or systems: Secondary | ICD-10-CM | POA: Diagnosis not present

## 2022-09-30 DIAGNOSIS — Z8041 Family history of malignant neoplasm of ovary: Secondary | ICD-10-CM | POA: Insufficient documentation

## 2022-09-30 DIAGNOSIS — Z5111 Encounter for antineoplastic chemotherapy: Secondary | ICD-10-CM | POA: Diagnosis present

## 2022-09-30 DIAGNOSIS — C259 Malignant neoplasm of pancreas, unspecified: Secondary | ICD-10-CM

## 2022-09-30 MED ORDER — SODIUM CHLORIDE 0.9% FLUSH
10.0000 mL | INTRAVENOUS | Status: DC | PRN
  Administered 2022-09-30: 10 mL via INTRAVENOUS

## 2022-09-30 MED ORDER — HEPARIN SOD (PORK) LOCK FLUSH 100 UNIT/ML IV SOLN
500.0000 [IU] | Freq: Once | INTRAVENOUS | Status: AC
  Administered 2022-09-30: 500 [IU] via INTRAVENOUS

## 2022-09-30 NOTE — Patient Instructions (Signed)
[facility] [place] [place]  Discharge Instructions: Thank you for choosing [facility] to provide your oncology and hematology care.  If you have a lab appointment with the Cancer Center - please note that after April 8th, 2024, all labs will be drawn in the cancer center.  You do not have to check in or register with the main entrance as you have in the past but will complete your check-in in the cancer center.  Wear comfortable clothing and clothing appropriate for easy access to any Portacath or PICC line.   We strive to give you quality time with your provider. You may need to reschedule your appointment if you arrive late (15 or more minutes).  Arriving late affects you and other patients whose appointments are after yours.  Also, if you miss three or more appointments without notifying the office, you may be dismissed from the clinic at the provider's discretion.      For prescription refill requests, have your pharmacy contact our office and allow 72 hours for refills to be completed.  To help prevent nausea and vomiting after your treatment, we encourage you to take your nausea medication as directed.  BELOW ARE SYMPTOMS THAT SHOULD BE REPORTED IMMEDIATELY: *FEVER GREATER THAN 100.4 F (38 C) OR HIGHER *CHILLS OR SWEATING *NAUSEA AND VOMITING THAT IS NOT CONTROLLED WITH YOUR NAUSEA MEDICATION *UNUSUAL SHORTNESS OF BREATH *UNUSUAL BRUISING OR BLEEDING *URINARY PROBLEMS (pain or burning when urinating, or frequent urination) *BOWEL PROBLEMS (unusual diarrhea, constipation, pain near the anus) TENDERNESS IN MOUTH AND THROAT WITH OR WITHOUT PRESENCE OF ULCERS (sore throat, sores in mouth, or a toothache) UNUSUAL RASH, SWELLING OR PAIN  UNUSUAL VAGINAL DISCHARGE OR ITCHING   Items with * indicate a potential emergency and should be followed up as soon as possible or go to the Emergency Department if any problems should occur.  Please show the CHEMOTHERAPY ALERT CARD or  IMMUNOTHERAPY ALERT CARD at check-in to the Emergency Department and triage nurse.  Should you have questions after your visit or need to cancel or reschedule your appointment, please contact [facility] [phone]  and follow the prompts.  Office hours are 8:00 a.m. to 4:30 p.m. Monday - Friday. Please note that voicemails left after 4:00 p.m. may not be returned until the following business day.  We are closed weekends and major holidays. You have access to a nurse at all times for urgent questions. Please call the main number to the clinic [phone] and follow the prompts.  For any non-urgent questions, you may also contact your provider using MyChart. We now offer e-Visits for anyone 18 and older to request care online for non-urgent symptoms. For details visit [email or web address].   Also download the MyChart app! Go to the app store, search "MyChart", open the app, select [facility], and log in with your MyChart username and password.   

## 2022-09-30 NOTE — Progress Notes (Signed)
Patients port flushed without difficulty.  Good blood return noted with no bruising or swelling noted at site.  Home infusion 5FU pump disconnected with no issues.  Band aid applied.  VSS with discharge and left in satisfactory condition with no s/s of distress noted.   

## 2022-10-11 NOTE — Progress Notes (Signed)
[facility] [address], [place] [address]    Clinic Day:  10/12/2022  Referring physician: [name], [name]  Patient Care Team: [name], [name] as PCP - General (Family Medicine) [name], MD as Medical Oncologist (Medical Oncology) [name], RN as Oncology Nurse Navigator (Medical Oncology)   ASSESSMENT & PLAN:   Assessment: 1. T2 N0 M0 pancreatic adenocarcinoma: - She presented with painless jaundice in April 2023.  Weight loss of 13 pounds since April. - Stent placement on 09/10/2021 for a bilirubin of 8.2. - 10/03/2021: ERCP and stent removal and stent placement for stent dysfunction with total bilirubin 16.3. - 10/29/2021: ERCP/EUS: Lower CBD stent stenosis from stricture, status post metal stent placement.  EUS showed mass in the superior pancreatic head measuring 2.8 x 2.2 cm, suggesting abutment of the portal vein.  Intact interface was seen between lesion and SMA and celiac trunk suggesting lack of invasion.  Hypoechoic 14 x 12 mm lesion visualized in the liver.  Biopsy could not be attempted due to presence of blood vessel. - She had syncopal episodes in 2022-07-01, had pacemaker placement for bradycardia. - Pathology: Pancreatic head FNA and biliary stricture brushing: Malignant cells consistent with adenocarcinoma. - CA 19-9: 131 - We reviewed MRI of the abdomen with and without contrast (12/24/2021): Pancreatic head mass measures 2.7 x 2.3 cm (2.5 x 1.8 cm previously).  Mass is abutting and slightly displacing the SMA.  No obvious fat plane is demonstrated.  Portal and splenic veins are patent.  Stable small scattered hepatic cysts with no liver metastatic disease. - PET scan (11/20/2021): Ill-defined low-level hypermetabolic activity about the metallic CBD stent and pancreatic uncinate process without discrete CT correlate.  No convincing evidence of hypermetabolic disease in the neck, chest, abdomen or pelvis.  Tiny bilateral  lung nodules measuring up to 3 mm.  -CT pancreatic protocol (01/16/2022): Hypoenhancing mass in the pancreatic head measuring 2.9 x 1.8 cm.  Slightly greater than 180 degrees of abutment with portal vein as well as SMA without definite vascular encasement.  New enhancing 9 mm soft tissue nodule along the posterior aspect of the gallbladder wall?  Suspicious for peritoneal implant. -I have discussed with Dr. Freida Busman who has seen this patient. -Dr. Freida Busman thought that this patient might not be a candidate for surgery but she would like to evaluate after 3 to 4 months of neoadjuvant chemotherapy. -We talked about neoadjuvant chemotherapy.  She is not a candidate for FOLFIRINOX.  I have recommended gemcitabine and Abraxane every other week.  Cycle 1 was started on 02/05/2022.  - I have discussed NGS test results.  Guardant360 did not show MSI high.  No other targetable mutations.  Limited tissue NGS testing did not show any targetable mutations.  MS-stable.  TMB-low.   2. Social/family history: - She lives at home with her husband who has dementia.  She worked as a Conservation officer, nature for 33 years.  Non-smoker and no exposure to chemicals.  She has not been driving since 07/01/2022. - Daughter died of glioblastoma.  Mother had ovarian cancer.  Maternal aunt had breast cancer.    Plan: T2 N0 M0 pancreatic adenocarcinoma: - Cycle 3 FOLFOX with 80% dose reduction 2 weeks ago.  She felt weak during the first week.  She had cold sensitivity in the first 3 to 4 days.  Denied any tingling or numbness in the extremities. - She had some mucositis.  Recommend rinsing mouth with solution of salt and  baking soda. - Labs today: Normal LFTs.  Creatinine 1.10 and stable.  CBC grossly normal.  Last CEA 19-9 was 371, down from 381. - Recommend proceeding with FOLFOX with 20% dose reduction.  She felt bad when we included Irinotecan during cycle 2.  We will continue to hold it. - Will proceed with 6 cycles of chemotherapy followed by  restaging.  If she gets a good response, will consider chemoradiation with Xeloda.  2.  Weight loss: - Continue Megace 400 mg twice daily.  She gained 1.6 pounds from last visit.   3.  Normocytic anemia: - Last Venofer on 07/31/2022.  Hemoglobin today is 11.5.    Orders Placed This Encounter  Procedures   Magnesium    Standing Status:   Future    Standing Expiration Date:   10/27/2023   Cancer antigen 19-9    Standing Status:   Future    Standing Expiration Date:   10/27/2023   CBC with Differential    Standing Status:   Future    Standing Expiration Date:   10/27/2023   Comprehensive metabolic panel    Standing Status:   Future    Standing Expiration Date:   10/27/2023   Magnesium    Standing Status:   Future    Standing Expiration Date:   11/10/2023   Cancer antigen 19-9    Standing Status:   Future    Standing Expiration Date:   11/10/2023   CBC with Differential    Standing Status:   Future    Standing Expiration Date:   11/10/2023   Comprehensive metabolic panel    Standing Status:   Future    Standing Expiration Date:   11/10/2023      I,Katie Daubenspeck,acting as a scribe for [name], MD.,have documented all relevant documentation on the behalf of [name], MD,as directed by  [name], MD while in the presence of [name], MD.   I, [name] MD, have reviewed the above documentation for accuracy and completeness, and I agree with the above.   [name], MD   5/13/202412:24 PM  CHIEF COMPLAINT:   Diagnosis: pancreatic adenocarcinoma    Cancer Staging  Pancreatic carcinoma [facility]) Staging form: Exocrine Pancreas, AJCC 8th Edition - Clinical stage from 11/11/2021: Stage IB (cT2, cN0, cM0) - Unsigned    Prior Therapy: Gemcitabine and Abraxane   Current Therapy:  FOLFOX    HISTORY OF PRESENT ILLNESS:   Oncology History  Pancreatic carcinoma (HCC)  11/11/2021 Initial Diagnosis   Pancreatic  carcinoma (HCC)   02/05/2022 - 07/02/2022 Chemotherapy   Patient is on Treatment Plan : PANCREATIC Abraxane D1,15 + Gemcitabine D1,15 q28d     08/31/2022 -  Chemotherapy   Patient is on Treatment Plan : PANCREAS Modified FOLFIRINOX q14d x 4 cycles        INTERVAL HISTORY:   Bergan is a 88 y.o. female presenting to clinic today for follow up of pancreatic adenocarcinoma. She was last seen by me on 09/28/22.  Today, she states that she is doing well overall. Her appetite level is at 75%. Her energy level is at 60%.  PAST MEDICAL HISTORY:   Past Medical History: Past Medical History:  Diagnosis Date   Essential tremor    GERD (gastroesophageal reflux disease)    High cholesterol    Hypertension    PONV (postoperative nausea and vomiting)    Port-A-Cath in place 01/27/2022   Vertigo     Surgical History: Past Surgical History:  Procedure Laterality Date   BILIARY BRUSHING  09/10/2021   Procedure: BILIARY BRUSHING;  Surgeon: Hilarie Fredrickson, MD;  Location: [facility] ENDOSCOPY;  Service: Gastroenterology;;   BILIARY BRUSHING  10/29/2021   Procedure: BILIARY BRUSHING;  Surgeon: Lemar Lofty., MD;  Location: [facility] ENDOSCOPY;  Service: Gastroenterology;;   BILIARY DILATION  10/29/2021   Procedure: BILIARY DILATION;  Surgeon: Lemar Lofty., MD;  Location: [facility] ENDOSCOPY;  Service: Gastroenterology;;   BILIARY STENT PLACEMENT  09/10/2021   Procedure: BILIARY STENT PLACEMENT;  Surgeon: Hilarie Fredrickson, MD;  Location: [facility] ENDOSCOPY;  Service: Gastroenterology;;   BILIARY STENT PLACEMENT N/A 10/03/2021   Procedure: BILIARY STENT PLACEMENT;  Surgeon: Iva Boop, MD;  Location: [facility] ENDOSCOPY;  Service: Gastroenterology;  Laterality: N/A;   BILIARY STENT PLACEMENT N/A 10/29/2021   Procedure: BILIARY STENT PLACEMENT;  Surgeon: Meridee Score Netty Starring., MD;  Location: [facility] ENDOSCOPY;  Service: Gastroenterology;  Laterality: N/A;   BILIARY STENT PLACEMENT N/A 05/08/2022   Procedure: BILIARY STENT  PLACEMENT;  Surgeon: Iva Boop, MD;  Location: [facility] ENDOSCOPY;  Service: Gastroenterology;  Laterality: N/A;   BIOPSY  10/29/2021   Procedure: BIOPSY;  Surgeon: Meridee Score Netty Starring., MD;  Location: [facility] ENDOSCOPY;  Service: Gastroenterology;;   ENDOSCOPIC RETROGRADE CHOLANGIOPANCREATOGRAPHY (ERCP) WITH PROPOFOL N/A 10/29/2021   Procedure: ENDOSCOPIC RETROGRADE CHOLANGIOPANCREATOGRAPHY (ERCP) WITH PROPOFOL;  Surgeon: Lemar Lofty., MD;  Location: [facility] ENDOSCOPY;  Service: Gastroenterology;  Laterality: N/A;   ERCP N/A 09/10/2021   Procedure: ENDOSCOPIC RETROGRADE CHOLANGIOPANCREATOGRAPHY (ERCP);  Surgeon: Hilarie Fredrickson, MD;  Location: [facility] ENDOSCOPY;  Service: Gastroenterology;  Laterality: N/A;   ERCP N/A 10/03/2021   Procedure: ENDOSCOPIC RETROGRADE CHOLANGIOPANCREATOGRAPHY (ERCP);  Surgeon: Iva Boop, MD;  Location: [facility] ENDOSCOPY;  Service: Gastroenterology;  Laterality: N/A;   ERCP N/A 05/08/2022   Procedure: ENDOSCOPIC RETROGRADE CHOLANGIOPANCREATOGRAPHY (ERCP);  Surgeon: Iva Boop, MD;  Location: [facility] ENDOSCOPY;  Service: Gastroenterology;  Laterality: N/A;   ESOPHAGOGASTRODUODENOSCOPY (EGD) WITH PROPOFOL N/A 10/29/2021   Procedure: ESOPHAGOGASTRODUODENOSCOPY (EGD) WITH PROPOFOL;  Surgeon: Meridee Score Netty Starring., MD;  Location: [facility] ENDOSCOPY;  Service: Gastroenterology;  Laterality: N/A;   EUS N/A 10/29/2021   Procedure: UPPER ENDOSCOPIC ULTRASOUND (EUS) RADIAL;  Surgeon: Lemar Lofty., MD;  Location: [facility] ENDOSCOPY;  Service: Gastroenterology;  Laterality: N/A;   FINE NEEDLE ASPIRATION  10/29/2021   Procedure: FINE NEEDLE ASPIRATION (FNA) LINEAR;  Surgeon: Lemar Lofty., MD;  Location: [facility] ENDOSCOPY;  Service: Gastroenterology;;   IR IMAGING GUIDED PORT INSERTION  01/28/2022   PACEMAKER IMPLANT     REMOVAL OF STONES  10/29/2021   Procedure: REMOVAL OF SLUDGE;  Surgeon: Lemar Lofty., MD;  Location: [facility] ENDOSCOPY;  Service: Gastroenterology;;    Dennison Mascot  09/10/2021   Procedure: Dennison Mascot;  Surgeon: Hilarie Fredrickson, MD;  Location: [facility] ENDOSCOPY;  Service: Gastroenterology;;   Burman Freestone CHOLANGIOSCOPY N/A 10/29/2021   Procedure: ZOXWRUEA CHOLANGIOSCOPY;  Surgeon: Lemar Lofty., MD;  Location: [facility] ENDOSCOPY;  Service: Gastroenterology;  Laterality: N/A;   STENT REMOVAL  10/03/2021   Procedure: STENT REMOVAL;  Surgeon: Iva Boop, MD;  Location: [facility] ENDOSCOPY;  Service: Gastroenterology;;    Social History: Social History   Socioeconomic History   Marital status: Widowed    Spouse name: Not on file   Number of children: Not on file   Years of education: Not on file   Highest education level: Not on file  Occupational History   Not on file  Tobacco Use   Smoking status: Never   Smokeless  tobacco: Never  Vaping Use   Vaping Use: Never used  Substance and Sexual Activity   Alcohol use: Never   Drug use: Never   Sexual activity: Not on file  Other Topics Concern   Not on file  Social History Narrative   Not on file   Social Determinants of Health   Financial Resource Strain: Not on file  Food Insecurity: No Food Insecurity (05/07/2022)   Hunger Vital Sign    Worried About Running Out of Food in the Last Year: Never true    Ran Out of Food in the Last Year: Never true  Transportation Needs: No Transportation Needs (05/07/2022)   PRAPARE - Administrator, Civil Service (Medical): No    Lack of Transportation (Non-Medical): No  Physical Activity: Not on file  Stress: Not on file  Social Connections: Not on file  Intimate Partner Violence: Not At Risk (05/07/2022)   Humiliation, Afraid, Rape, and Kick questionnaire    Fear of Current or Ex-Partner: No    Emotionally Abused: No    Physically Abused: No    Sexually Abused: No    Family History: Family History  Problem Relation Age of Onset   Stomach cancer Neg Hx    Esophageal cancer Neg Hx    Colon cancer Neg Hx    Pancreatic cancer  Neg Hx     Current Medications:  Current Outpatient Medications:    amLODipine (NORVASC) 5 MG tablet, 1 tab(s) orally once a day for 90 days, Disp: , Rfl:    aspirin EC 81 MG tablet, Take 81 mg by mouth daily. Swallow whole., Disp: , Rfl:    Cholecalciferol (VITAMIN D3) 50 MCG (2000 UT) TABS, Take 2,000 Units by mouth daily after supper., Disp: , Rfl:    Coenzyme Q10 (CO Q-10) 100 MG CAPS, Take 100 mg by mouth every evening., Disp: , Rfl:    famotidine (PEPCID) 40 MG tablet, Take 40 mg by mouth every evening., Disp: , Rfl:    gabapentin (NEURONTIN) 100 MG capsule, Take 100 mg by mouth 2 (two) times daily., Disp: , Rfl:    meclizine (ANTIVERT) 25 MG tablet, Take 25 mg by mouth daily as needed for dizziness., Disp: , Rfl:    megestrol (MEGACE) 400 MG/10ML suspension, Take 10 mLs (400 mg total) by mouth 2 (two) times daily., Disp: 480 mL, Rfl: 0   Misc Natural Products (JOINT SUPPORT) CAPS, Take 1 capsule by mouth daily with breakfast., Disp: , Rfl:    Multiple Vitamin (MULTIVITAMIN) tablet, Take 1 tablet by mouth daily with breakfast., Disp: , Rfl:    Multiple Vitamins-Minerals (OCUVITE EYE HEALTH FORMULA) CAPS, Take 1 capsule by mouth daily., Disp: , Rfl:    nebivolol (BYSTOLIC) 5 MG tablet, Take 2.5 mg by mouth in the morning., Disp: , Rfl:    omeprazole (PRILOSEC) 40 MG capsule, Take 40 mg by mouth daily before breakfast., Disp: , Rfl:    polyethylene glycol (MIRALAX) 17 g packet, Take 17 g by mouth daily as needed. (Patient taking differently: Take 17 g by mouth daily as needed for moderate constipation.), Disp: 14 each, Rfl: 0   Polyvinyl Alcohol-Povidone (REFRESH OP), Place 1 drop into both eyes daily as needed (tired eyes)., Disp: , Rfl:    predniSONE (DELTASONE) 50 MG tablet, Take 13 hours, 7 hours and 1 hour prior to CT scan, Disp: 3 tablet, Rfl: 0   primidone (MYSOLINE) 50 MG tablet, Take 25 mg by mouth daily as needed (for  tremors)., Disp: , Rfl:    pyridOXINE (VITAMIN B-6) 100 MG  tablet, Take 50 mg by mouth in the morning., Disp: , Rfl:    tiZANidine (ZANAFLEX) 2 MG tablet, Take 1 mg by mouth daily as needed for muscle spasms., Disp: , Rfl:  No current facility-administered medications for this visit.  Facility-Administered Medications Ordered in Other Visits:    fluorouracil (ADRUCIL) 3,000 mg in sodium chloride 0.9 % 90 mL chemo infusion, 1,920 mg/m2 (Treatment Plan Recorded), Intravenous, 1 day or 1 dose, [name], MD   fosaprepitant (EMEND) 150 mg in sodium chloride 0.9 % 145 mL IVPB, 150 mg, Intravenous, Once, [name], MD, Last Rate: 450 mL/hr at 10/12/22 1206, 150 mg at 10/12/22 1206   leucovorin 500 mg in dextrose 5 % 250 mL infusion, 320 mg/m2 (Treatment Plan Recorded), Intravenous, Once, [name], MD   oxaliplatin (ELOXATIN) 100 mg in dextrose 5 % 500 mL chemo infusion, 68 mg/m2 (Treatment Plan Recorded), Intravenous, Once, [name], MD   Allergies: Allergies  Allergen Reactions   Ivp Dye [Iodinated Contrast Media] Hives   Lisinopril Cough    REVIEW OF SYSTEMS:   Review of Systems  Constitutional:  Negative for chills, fatigue and fever.  HENT:   Negative for lump/mass, mouth sores, nosebleeds, sore throat and trouble swallowing.   Eyes:  Negative for eye problems.  Respiratory:  Negative for cough and shortness of breath.   Cardiovascular:  Positive for palpitations. Negative for chest pain and leg swelling.  Gastrointestinal:  Positive for constipation. Negative for abdominal pain, diarrhea, nausea and vomiting.  Genitourinary:  Negative for bladder incontinence, difficulty urinating, dysuria, frequency, hematuria and nocturia.   Musculoskeletal:  Negative for arthralgias, back pain, flank pain, myalgias and neck pain.  Skin:  Negative for itching and rash.  Neurological:  Negative for dizziness, headaches and numbness.  Hematological:  Does not bruise/bleed easily.  Psychiatric/Behavioral:  Negative  for depression, sleep disturbance and suicidal ideas. The patient is not nervous/anxious.   All other systems reviewed and are negative.    VITALS:   There were no vitals taken for this visit.  Wt Readings from Last 3 Encounters:  10/12/22 114 lb 9.6 oz (52 kg)  09/28/22 113 lb 4.8 oz (51.4 kg)  09/14/22 115 lb (52.2 kg)    There is no height or weight on file to calculate BMI.  Performance status (ECOG): 1 - Symptomatic but completely ambulatory  PHYSICAL EXAM:   Physical Exam Vitals and nursing note reviewed. Exam conducted with a chaperone present.  Constitutional:      Appearance: Normal appearance.  Cardiovascular:     Rate and Rhythm: Normal rate and regular rhythm.     Pulses: Normal pulses.     Heart sounds: Normal heart sounds.  Pulmonary:     Effort: Pulmonary effort is normal.     Breath sounds: Normal breath sounds.  Abdominal:     Palpations: Abdomen is soft. There is no hepatomegaly, splenomegaly or mass.     Tenderness: There is no abdominal tenderness.  Musculoskeletal:     Right lower leg: No edema.     Left lower leg: No edema.  Lymphadenopathy:     Cervical: No cervical adenopathy.     Right cervical: No superficial, deep or posterior cervical adenopathy.    Left cervical: No superficial, deep or posterior cervical adenopathy.     Upper Body:     Right upper body: No supraclavicular or axillary adenopathy.     Left  upper body: No supraclavicular or axillary adenopathy.  Neurological:     General: No focal deficit present.     Mental Status: She is alert and oriented to person, place, and time.  Psychiatric:        Mood and Affect: Mood normal.        Behavior: Behavior normal.     LABS:      Latest Ref Rng & Units 10/12/2022   10:04 AM 09/28/2022    9:34 AM 09/14/2022    8:58 AM  CBC  WBC 4.0 - 10.5 K/uL 7.3  6.2  5.7   Hemoglobin 12.0 - 15.0 g/dL 40.9  81.1  91.4   Hematocrit 36.0 - 46.0 % 34.0  33.3  33.0   Platelets 150 - 400 K/uL 253   230  244       Latest Ref Rng & Units 10/12/2022   10:04 AM 09/28/2022    9:34 AM 09/14/2022    8:58 AM  CMP  Glucose 70 - 99 mg/dL 782  956  94   BUN 8 - 23 mg/dL 32  38  34   Creatinine 0.44 - 1.00 mg/dL 2.13  0.86  5.78   Sodium 135 - 145 mmol/L 132  135  135   Potassium 3.5 - 5.1 mmol/L 4.2  4.3  4.4   Chloride 98 - 111 mmol/L 106  104  106   CO2 22 - 32 mmol/L 19  22  26    Calcium 8.9 - 10.3 mg/dL 9.0  9.6  9.4   Total Protein 6.5 - 8.1 g/dL 6.4  6.4  6.2   Total Bilirubin 0.3 - 1.2 mg/dL 0.6  0.6  0.5   Alkaline Phos 38 - 126 U/L 115  254  250   AST 15 - 41 U/L 21  37  48   ALT 0 - 44 U/L 35  100  72      No results found for: "CEA1", "CEA" / No results found for: "CEA1", "CEA" No results found for: "PSA1" Lab Results  Component Value Date   CAN199 371 (H) 09/28/2022   No results found for: "CAN125"  No results found for: "TOTALPROTELP", "ALBUMINELP", "A1GS", "A2GS", "BETS", "BETA2SER", "GAMS", "MSPIKE", "SPEI" Lab Results  Component Value Date   TIBC 438 06/18/2022   FERRITIN 32 06/18/2022   IRONPCTSAT 13 06/18/2022   No results found for: "LDH"   STUDIES:   No results found.

## 2022-10-12 ENCOUNTER — Inpatient Hospital Stay (HOSPITAL_BASED_OUTPATIENT_CLINIC_OR_DEPARTMENT_OTHER): Payer: Medicare HMO | Admitting: Hematology

## 2022-10-12 ENCOUNTER — Inpatient Hospital Stay: Payer: Medicare HMO

## 2022-10-12 ENCOUNTER — Inpatient Hospital Stay: Payer: Medicare HMO | Admitting: Dietician

## 2022-10-12 VITALS — BP 116/67 | HR 82 | Temp 98.4°F | Resp 18

## 2022-10-12 DIAGNOSIS — Z95828 Presence of other vascular implants and grafts: Secondary | ICD-10-CM | POA: Diagnosis not present

## 2022-10-12 DIAGNOSIS — C259 Malignant neoplasm of pancreas, unspecified: Secondary | ICD-10-CM

## 2022-10-12 DIAGNOSIS — Z5111 Encounter for antineoplastic chemotherapy: Secondary | ICD-10-CM | POA: Diagnosis not present

## 2022-10-12 LAB — COMPREHENSIVE METABOLIC PANEL
ALT: 35 U/L (ref 0–44)
AST: 21 U/L (ref 15–41)
Albumin: 3.1 g/dL — ABNORMAL LOW (ref 3.5–5.0)
Alkaline Phosphatase: 115 U/L (ref 38–126)
Anion gap: 7 (ref 5–15)
BUN: 32 mg/dL — ABNORMAL HIGH (ref 8–23)
CO2: 19 mmol/L — ABNORMAL LOW (ref 22–32)
Calcium: 9 mg/dL (ref 8.9–10.3)
Chloride: 106 mmol/L (ref 98–111)
Creatinine, Ser: 1.1 mg/dL — ABNORMAL HIGH (ref 0.44–1.00)
GFR, Estimated: 49 mL/min — ABNORMAL LOW (ref >60)
Glucose, Bld: 123 mg/dL — ABNORMAL HIGH (ref 70–99)
Potassium: 4.2 mmol/L (ref 3.5–5.1)
Sodium: 132 mmol/L — ABNORMAL LOW (ref 135–145)
Total Bilirubin: 0.6 mg/dL (ref 0.3–1.2)
Total Protein: 6.4 g/dL — ABNORMAL LOW (ref 6.5–8.1)

## 2022-10-12 LAB — CBC WITH DIFFERENTIAL/PLATELET
Abs Immature Granulocytes: 0.04 10*3/uL (ref 0.00–0.07)
Basophils Absolute: 0 10*3/uL (ref 0.0–0.1)
Basophils Relative: 0 %
Eosinophils Absolute: 0.1 10*3/uL (ref 0.0–0.5)
Eosinophils Relative: 1 %
HCT: 34 % — ABNORMAL LOW (ref 36.0–46.0)
Hemoglobin: 11.5 g/dL — ABNORMAL LOW (ref 12.0–15.0)
Immature Granulocytes: 1 %
Lymphocytes Relative: 24 %
Lymphs Abs: 1.8 10*3/uL (ref 0.7–4.0)
MCH: 31.8 pg (ref 26.0–34.0)
MCHC: 33.8 g/dL (ref 30.0–36.0)
MCV: 93.9 fL (ref 80.0–100.0)
Monocytes Absolute: 1.2 10*3/uL — ABNORMAL HIGH (ref 0.1–1.0)
Monocytes Relative: 17 %
Neutro Abs: 4.2 10*3/uL (ref 1.7–7.7)
Neutrophils Relative %: 57 %
Platelets: 253 10*3/uL (ref 150–400)
RBC: 3.62 MIL/uL — ABNORMAL LOW (ref 3.87–5.11)
RDW: 18.4 % — ABNORMAL HIGH (ref 11.5–15.5)
WBC: 7.3 10*3/uL (ref 4.0–10.5)
nRBC: 0 % (ref 0.0–0.2)

## 2022-10-12 LAB — MAGNESIUM: Magnesium: 1.9 mg/dL (ref 1.7–2.4)

## 2022-10-12 MED ORDER — LEUCOVORIN CALCIUM INJECTION 350 MG
320.0000 mg/m2 | Freq: Once | INTRAVENOUS | Status: AC
  Administered 2022-10-12: 500 mg via INTRAVENOUS
  Filled 2022-10-12: qty 25

## 2022-10-12 MED ORDER — SODIUM CHLORIDE 0.9 % IV SOLN
1920.0000 mg/m2 | INTRAVENOUS | Status: DC
  Administered 2022-10-12: 3000 mg via INTRAVENOUS
  Filled 2022-10-12: qty 60

## 2022-10-12 MED ORDER — OXALIPLATIN CHEMO INJECTION 100 MG/20ML
68.0000 mg/m2 | Freq: Once | INTRAVENOUS | Status: AC
  Administered 2022-10-12: 100 mg via INTRAVENOUS
  Filled 2022-10-12: qty 20

## 2022-10-12 MED ORDER — DEXTROSE 5 % IV SOLN: Freq: Once | INTRAVENOUS | Status: AC

## 2022-10-12 MED ORDER — PALONOSETRON HCL INJECTION 0.25 MG/5ML
0.2500 mg | Freq: Once | INTRAVENOUS | Status: AC
  Administered 2022-10-12: 0.25 mg via INTRAVENOUS
  Filled 2022-10-12: qty 5

## 2022-10-12 MED ORDER — SODIUM CHLORIDE 0.9 % IV SOLN
150.0000 mg | Freq: Once | INTRAVENOUS | Status: AC
  Administered 2022-10-12: 150 mg via INTRAVENOUS
  Filled 2022-10-12: qty 150

## 2022-10-12 MED ORDER — SODIUM CHLORIDE 0.9 % IV SOLN
10.0000 mg | Freq: Once | INTRAVENOUS | Status: AC
  Administered 2022-10-12: 10 mg via INTRAVENOUS
  Filled 2022-10-12: qty 10

## 2022-10-12 MED ORDER — SODIUM CHLORIDE 0.9% FLUSH
10.0000 mL | Freq: Once | INTRAVENOUS | Status: AC
  Administered 2022-10-12: 10 mL via INTRAVENOUS

## 2022-10-12 NOTE — Patient Instructions (Signed)
[facility] Discharge Instructions   You were seen and examined today by Dr. Katragadda.  He reviewed the results of your lab work which are normal/stable.   We will proceed with your treatment today.  Return as scheduled.    Thank you for choosing [facility] to provide your oncology and hematology care.  To afford each patient quality time with our provider, please arrive at least 15 minutes before your scheduled appointment time.   If you have a lab appointment with the Cancer Center please come in thru the Main Entrance and check in at the main information desk.  You need to re-schedule your appointment should you arrive 10 or more minutes late.  We strive to give you quality time with our providers, and arriving late affects you and other patients whose appointments are after yours.  Also, if you no show three or more times for appointments you may be dismissed from the clinic at the providers discretion.     Again, thank you for choosing [facility].  Our hope is that these requests will decrease the amount of time that you wait before being seen by our physicians.       _____________________________________________________________  Should you have questions after your visit to [facility], please contact our office at [phone] and follow the prompts.  Our office hours are 8:00 a.m. and 4:30 p.m. Monday - Friday.  Please note that voicemails left after 4:00 p.m. may not be returned until the following business day.  We are closed weekends and major holidays.  You do have access to a nurse 24-7, just call the main number to the clinic [phone] and do not press any options, hold on the line and a nurse will answer the phone.    For prescription refill requests, have your pharmacy contact our office and allow 72 hours.    Due to Covid, you will need to wear a mask upon entering  the hospital. If you do not have a mask, a mask will be given to you at the Main Entrance upon arrival. For doctor visits, patients may have 1 support person age 18 or older with them. For treatment visits, patients can not have anyone with them due to social distancing guidelines and our immunocompromised population.      

## 2022-10-12 NOTE — Progress Notes (Signed)
Patient presents today for Folfox infusion. Patient is in satisfactory condition with no new complaints voiced.  Vital signs are stable.  Labs reviewed by Dr. Ellin Saba during the office visit and all labs are within treatment parameters. We will hold Irinotecan today per Dr.K.  We will proceed with treatment per MD orders.  Folfox given today per MD orders. Tolerated infusion without adverse affects. Vital signs stable. No complaints at this time. Discharged from clinic via wheelchair in stable condition. Alert and oriented x 3. F/U with [facility] as scheduled. 5FU ambulatory pump infusing.

## 2022-10-12 NOTE — Progress Notes (Signed)
Patient has been examined by Dr. Ellin Saba. Vital signs and labs have been reviewed by MD - ANC, Creatinine, LFTs, hemoglobin, and platelets are within treatment parameters per M.D. - pt may proceed with treatment. Will hold irinotecan per MD. Primary RN and pharmacy notified.

## 2022-10-12 NOTE — Patient Instructions (Signed)
[facility]  Discharge Instructions: Thank you for choosing [facility] to provide your oncology and hematology care.  If you have a lab appointment with the Cancer Center - please note that after April 8th, 2024, all labs will be drawn in the cancer center.  You do not have to check in or register with the main entrance as you have in the past but will complete your check-in in the cancer center.  Wear comfortable clothing and clothing appropriate for easy access to any Portacath or PICC line.   We strive to give you quality time with your provider. You may need to reschedule your appointment if you arrive late (15 or more minutes).  Arriving late affects you and other patients whose appointments are after yours.  Also, if you miss three or more appointments without notifying the office, you may be dismissed from the clinic at the provider's discretion.      For prescription refill requests, have your pharmacy contact our office and allow 72 hours for refills to be completed.    Today you received the following chemotherapy and/or immunotherapy agents Folfox   To help prevent nausea and vomiting after your treatment, we encourage you to take your nausea medication as directed.  Oxaliplatin Injection What is this medication? OXALIPLATIN (ox AL i PLA tin) treats colorectal cancer. It works by slowing down the growth of cancer cells. This medicine may be used for other purposes; ask your health care provider or pharmacist if you have questions. COMMON BRAND NAME(S): Eloxatin What should I tell my care team before I take this medication? They need to know if you have any of these conditions: Heart disease History of irregular heartbeat or rhythm Liver disease Low blood cell levels (white cells, red cells, and platelets) Lung or breathing disease, such as asthma Take medications that treat or prevent blood clots Tingling of the fingers, toes, or other nerve  disorder An unusual or allergic reaction to oxaliplatin, other medications, foods, dyes, or preservatives If you or your partner are pregnant or trying to get pregnant Breast-feeding How should I use this medication? This medication is injected into a vein. It is given by your care team in a hospital or clinic setting. Talk to your care team about the use of this medication in children. Special care may be needed. Overdosage: If you think you have taken too much of this medicine contact a poison control center or emergency room at once. NOTE: This medicine is only for you. Do not share this medicine with others. What if I miss a dose? Keep appointments for follow-up doses. It is important not to miss a dose. Call your care team if you are unable to keep an appointment. What may interact with this medication? Do not take this medication with any of the following: Cisapride Dronedarone Pimozide Thioridazine This medication may also interact with the following: Aspirin and aspirin-like medications Certain medications that treat or prevent blood clots, such as warfarin, apixaban, dabigatran, and rivaroxaban Cisplatin Cyclosporine Diuretics Medications for infection, such as acyclovir, adefovir, amphotericin B, bacitracin, cidofovir, foscarnet, ganciclovir, gentamicin, pentamidine, vancomycin NSAIDs, medications for pain and inflammation, such as ibuprofen or naproxen Other medications that cause heart rhythm changes Pamidronate Zoledronic acid This list may not describe all possible interactions. Give your health care provider a list of all the medicines, herbs, non-prescription drugs, or dietary supplements you use. Also tell them if you smoke, drink alcohol, or use illegal drugs. Some items   may interact with your medicine. What should I watch for while using this medication? Your condition will be monitored carefully while you are receiving this medication. You may need blood work while  taking this medication. This medication may make you feel generally unwell. This is not uncommon as chemotherapy can affect healthy cells as well as cancer cells. Report any side effects. Continue your course of treatment even though you feel ill unless your care team tells you to stop. This medication may increase your risk of getting an infection. Call your care team for advice if you get a fever, chills, sore throat, or other symptoms of a cold or flu. Do not treat yourself. Try to avoid being around people who are sick. Avoid taking medications that contain aspirin, acetaminophen, ibuprofen, naproxen, or ketoprofen unless instructed by your care team. These medications may hide a fever. Be careful brushing or flossing your teeth or using a toothpick because you may get an infection or bleed more easily. If you have any dental work done, tell your dentist you are receiving this medication. This medication can make you more sensitive to cold. Do not drink cold drinks or use ice. Cover exposed skin before coming in contact with cold temperatures or cold objects. When out in cold weather wear warm clothing and cover your mouth and nose to warm the air that goes into your lungs. Tell your care team if you get sensitive to the cold. Talk to your care team if you or your partner are pregnant or think either of you might be pregnant. This medication can cause serious birth defects if taken during pregnancy and for 9 months after the last dose. A negative pregnancy test is required before starting this medication. A reliable form of contraception is recommended while taking this medication and for 9 months after the last dose. Talk to your care team about effective forms of contraception. Do not father a child while taking this medication and for 6 months after the last dose. Use a condom while having sex during this time period. Do not breastfeed while taking this medication and for 3 months after the last  dose. This medication may cause infertility. Talk to your care team if you are concerned about your fertility. What side effects may I notice from receiving this medication? Side effects that you should report to your care team as soon as possible: Allergic reactions--skin rash, itching, hives, swelling of the face, lips, tongue, or throat Bleeding--bloody or black, tar-like stools, vomiting blood or brown material that looks like coffee grounds, red or dark brown urine, small red or purple spots on skin, unusual bruising or bleeding Dry cough, shortness of breath or trouble breathing Heart rhythm changes--fast or irregular heartbeat, dizziness, feeling faint or lightheaded, chest pain, trouble breathing Infection--fever, chills, cough, sore throat, wounds that don't heal, pain or trouble when passing urine, general feeling of discomfort or being unwell Liver injury--right upper belly pain, loss of appetite, nausea, light-colored stool, dark yellow or brown urine, yellowing skin or eyes, unusual weakness or fatigue Low red blood cell level--unusual weakness or fatigue, dizziness, headache, trouble breathing Muscle injury--unusual weakness or fatigue, muscle pain, dark yellow or brown urine, decrease in amount of urine Pain, tingling, or numbness in the hands or feet Sudden and severe headache, confusion, change in vision, seizures, which may be signs of posterior reversible encephalopathy syndrome (PRES) Unusual bruising or bleeding Side effects that usually do not require medical attention (report to your care team if   they continue or are bothersome): Diarrhea Nausea Pain, redness, or swelling with sores inside the mouth or throat Unusual weakness or fatigue Vomiting This list may not describe all possible side effects. Call your doctor for medical advice about side effects. You may report side effects to FDA at 1-800-FDA-1088. Where should I keep my medication? This medication is given in a  hospital or clinic. It will not be stored at home. NOTE: This sheet is a summary. It may not cover all possible information. If you have questions about this medicine, talk to your doctor, pharmacist, or health care provider.  2023 Elsevier/Gold Standard (2007-07-09 00:00:00)   Leucovorin Injection What is this medication? LEUCOVORIN (loo koe VOR in) prevents side effects from certain medications, such as methotrexate. It works by increasing folate levels. This helps protect healthy cells in your body. It may also be used to treat anemia caused by low levels of folate. It can also be used with fluorouracil, a type of chemotherapy, to treat colorectal cancer. It works by increasing the effects of fluorouracil in the body. This medicine may be used for other purposes; ask your health care provider or pharmacist if you have questions. What should I tell my care team before I take this medication? They need to know if you have any of these conditions: Anemia from low levels of vitamin B12 in the blood An unusual or allergic reaction to leucovorin, folic acid, other medications, foods, dyes, or preservatives Pregnant or trying to get pregnant Breastfeeding How should I use this medication? This medication is injected into a vein or a muscle. It is given by your care team in a hospital or clinic setting. Talk to your care team about the use of this medication in children. Special care may be needed. Overdosage: If you think you have taken too much of this medicine contact a poison control center or emergency room at once. NOTE: This medicine is only for you. Do not share this medicine with others. What if I miss a dose? Keep appointments for follow-up doses. It is important not to miss your dose. Call your care team if you are unable to keep an appointment. What may interact with this medication? Capecitabine Fluorouracil Phenobarbital Phenytoin Primidone Trimethoprim;sulfamethoxazole This  list may not describe all possible interactions. Give your health care provider a list of all the medicines, herbs, non-prescription drugs, or dietary supplements you use. Also tell them if you smoke, drink alcohol, or use illegal drugs. Some items may interact with your medicine. What should I watch for while using this medication? Your condition will be monitored carefully while you are receiving this medication. This medication may increase the side effects of 5-fluorouracil. Tell your care team if you have diarrhea or mouth sores that do not get better or that get worse. What side effects may I notice from receiving this medication? Side effects that you should report to your care team as soon as possible: Allergic reactions--skin rash, itching, hives, swelling of the face, lips, tongue, or throat This list may not describe all possible side effects. Call your doctor for medical advice about side effects. You may report side effects to FDA at 1-800-FDA-1088. Where should I keep my medication? This medication is given in a hospital or clinic. It will not be stored at home. NOTE: This sheet is a summary. It may not cover all possible information. If you have questions about this medicine, talk to your doctor, pharmacist, or health care provider.  2023 Elsevier/Gold Standard (  2021-09-26 00:00:00)  Fluorouracil Injection What is this medication? FLUOROURACIL (flure oh YOOR a sil) treats some types of cancer. It works by slowing down the growth of cancer cells. This medicine may be used for other purposes; ask your health care provider or pharmacist if you have questions. COMMON BRAND NAME(S): Adrucil What should I tell my care team before I take this medication? They need to know if you have any of these conditions: Blood disorders Dihydropyrimidine dehydrogenase (DPD) deficiency Infection, such as chickenpox, cold sores, herpes Kidney disease Liver disease Poor nutrition Recent or ongoing  radiation therapy An unusual or allergic reaction to fluorouracil, other medications, foods, dyes, or preservatives If you or your partner are pregnant or trying to get pregnant Breast-feeding How should I use this medication? This medication is injected into a vein. It is administered by your care team in a hospital or clinic setting. Talk to your care team about the use of this medication in children. Special care may be needed. Overdosage: If you think you have taken too much of this medicine contact a poison control center or emergency room at once. NOTE: This medicine is only for you. Do not share this medicine with others. What if I miss a dose? Keep appointments for follow-up doses. It is important not to miss your dose. Call your care team if you are unable to keep an appointment. What may interact with this medication? Do not take this medication with any of the following: Live virus vaccines This medication may also interact with the following: Medications that treat or prevent blood clots, such as warfarin, enoxaparin, dalteparin This list may not describe all possible interactions. Give your health care provider a list of all the medicines, herbs, non-prescription drugs, or dietary supplements you use. Also tell them if you smoke, drink alcohol, or use illegal drugs. Some items may interact with your medicine. What should I watch for while using this medication? Your condition will be monitored carefully while you are receiving this medication. This medication may make you feel generally unwell. This is not uncommon as chemotherapy can affect healthy cells as well as cancer cells. Report any side effects. Continue your course of treatment even though you feel ill unless your care team tells you to stop. In some cases, you may be given additional medications to help with side effects. Follow all directions for their use. This medication may increase your risk of getting an infection.  Call your care team for advice if you get a fever, chills, sore throat, or other symptoms of a cold or flu. Do not treat yourself. Try to avoid being around people who are sick. This medication may increase your risk to bruise or bleed. Call your care team if you notice any unusual bleeding. Be careful brushing or flossing your teeth or using a toothpick because you may get an infection or bleed more easily. If you have any dental work done, tell your dentist you are receiving this medication. Avoid taking medications that contain aspirin, acetaminophen, ibuprofen, naproxen, or ketoprofen unless instructed by your care team. These medications may hide a fever. Do not treat diarrhea with over the counter products. Contact your care team if you have diarrhea that lasts more than 2 days or if it is severe and watery. This medication can make you more sensitive to the sun. Keep out of the sun. If you cannot avoid being in the sun, wear protective clothing and sunscreen. Do not use sun lamps, tanning beds, or   tanning booths. Talk to your care team if you or your partner wish to become pregnant or think you might be pregnant. This medication can cause serious birth defects if taken during pregnancy and for 3 months after the last dose. A reliable form of contraception is recommended while taking this medication and for 3 months after the last dose. Talk to your care team about effective forms of contraception. Do not father a child while taking this medication and for 3 months after the last dose. Use a condom while having sex during this time period. Do not breastfeed while taking this medication. This medication may cause infertility. Talk to your care team if you are concerned about your fertility. What side effects may I notice from receiving this medication? Side effects that you should report to your care team as soon as possible: Allergic reactions--skin rash, itching, hives, swelling of the face, lips,  tongue, or throat Heart attack--pain or tightness in the chest, shoulders, arms, or jaw, nausea, shortness of breath, cold or clammy skin, feeling faint or lightheaded Heart failure--shortness of breath, swelling of the ankles, feet, or hands, sudden weight gain, unusual weakness or fatigue Heart rhythm changes--fast or irregular heartbeat, dizziness, feeling faint or lightheaded, chest pain, trouble breathing High ammonia level--unusual weakness or fatigue, confusion, loss of appetite, nausea, vomiting, seizures Infection--fever, chills, cough, sore throat, wounds that don't heal, pain or trouble when passing urine, general feeling of discomfort or being unwell Low red blood cell level--unusual weakness or fatigue, dizziness, headache, trouble breathing Pain, tingling, or numbness in the hands or feet, muscle weakness, change in vision, confusion or trouble speaking, loss of balance or coordination, trouble walking, seizures Redness, swelling, and blistering of the skin over hands and feet Severe or prolonged diarrhea Unusual bruising or bleeding Side effects that usually do not require medical attention (report to your care team if they continue or are bothersome): Dry skin Headache Increased tears Nausea Pain, redness, or swelling with sores inside the mouth or throat Sensitivity to light Vomiting This list may not describe all possible side effects. Call your doctor for medical advice about side effects. You may report side effects to FDA at 1-800-FDA-1088. Where should I keep my medication? This medication is given in a hospital or clinic. It will not be stored at home. NOTE: This sheet is a summary. It may not cover all possible information. If you have questions about this medicine, talk to your doctor, pharmacist, or health care provider.  2023 Elsevier/Gold Standard (2021-09-16 00:00:00)    BELOW ARE SYMPTOMS THAT SHOULD BE REPORTED IMMEDIATELY: *FEVER GREATER THAN 100.4 F (38  C) OR HIGHER *CHILLS OR SWEATING *NAUSEA AND VOMITING THAT IS NOT CONTROLLED WITH YOUR NAUSEA MEDICATION *UNUSUAL SHORTNESS OF BREATH *UNUSUAL BRUISING OR BLEEDING *URINARY PROBLEMS (pain or burning when urinating, or frequent urination) *BOWEL PROBLEMS (unusual diarrhea, constipation, pain near the anus) TENDERNESS IN MOUTH AND THROAT WITH OR WITHOUT PRESENCE OF ULCERS (sore throat, sores in mouth, or a toothache) UNUSUAL RASH, SWELLING OR PAIN  UNUSUAL VAGINAL DISCHARGE OR ITCHING   Items with * indicate a potential emergency and should be followed up as soon as possible or go to the Emergency Department if any problems should occur.  Please show the CHEMOTHERAPY ALERT CARD or IMMUNOTHERAPY ALERT CARD at check-in to the Emergency Department and triage nurse.  Should you have questions after your visit or need to cancel or reschedule your appointment, please contact [facility] [phone]  and follow   the prompts.  Office hours are 8:00 a.m. to 4:30 p.m. Monday - Friday. Please note that voicemails left after 4:00 p.m. may not be returned until the following business day.  We are closed weekends and major holidays. You have access to a nurse at all times for urgent questions. Please call the main number to the clinic [phone] and follow the prompts.  For any non-urgent questions, you may also contact your provider using MyChart. We now offer e-Visits for anyone 18 and older to request care online for non-urgent symptoms. For details visit [email or web address].   Also download the MyChart app! Go to the app store, search "MyChart", open the app, select [facility], and log in with your MyChart username and password.   

## 2022-10-12 NOTE — Progress Notes (Signed)
Nutrition Follow-up:  Patient with pancreatic adenocarcinoma. She is currently receiving modified Folfirinox q14d.   Met with patient in infusion. Patient reports appetite has been good. She is taking appetite stimulant. Patient says she is always hungry, but does not always want to get up to cook. Patient reports having easy to prepare foods on hand. Patient reports decreased taste and increased dry mouth. She is drinking ~3 bottles of water. Patient is drinking 2 Boost Plus. She has one before bed. Sometimes this gives her heartburn if she lays down too soon. Patient is taking pepcid.   Medications: megace  Labs: Na 132, glucose 123, BUN 32, Cr 1.10, albumin 3.1  Anthropometrics: Wt 114 lb 9.6 oz today - stable    NUTRITION DIAGNOSIS: Unintended weight loss stable     INTERVENTION:  Continue drinking Boost Plus/equivalent - 2x/day Discussed strategies for altered taste and dry mouth, suggested baking soda salt water rinses several times daily and before meals - handouts with tips provided     MONITORING, EVALUATION, GOAL: weight trends, intake    NEXT VISIT: Monday June 3 during infusion

## 2022-10-13 LAB — CANCER ANTIGEN 19-9: CA 19-9: 244 U/mL — ABNORMAL HIGH (ref 0–35)

## 2022-10-14 ENCOUNTER — Inpatient Hospital Stay: Payer: Medicare HMO

## 2022-10-14 VITALS — BP 130/57 | HR 69 | Temp 98.8°F | Resp 17

## 2022-10-14 DIAGNOSIS — Z5111 Encounter for antineoplastic chemotherapy: Secondary | ICD-10-CM | POA: Diagnosis not present

## 2022-10-14 DIAGNOSIS — Z95828 Presence of other vascular implants and grafts: Secondary | ICD-10-CM

## 2022-10-14 DIAGNOSIS — C259 Malignant neoplasm of pancreas, unspecified: Secondary | ICD-10-CM

## 2022-10-14 MED ORDER — HEPARIN SOD (PORK) LOCK FLUSH 100 UNIT/ML IV SOLN
500.0000 [IU] | Freq: Once | INTRAVENOUS | Status: AC | PRN
  Administered 2022-10-14: 500 [IU]

## 2022-10-14 MED ORDER — SODIUM CHLORIDE 0.9% FLUSH
10.0000 mL | INTRAVENOUS | Status: DC | PRN
  Administered 2022-10-14: 10 mL

## 2022-10-14 NOTE — Patient Instructions (Signed)
[facility]  Discharge Instructions: Thank you for choosing [facility] to provide your oncology and hematology care.  If you have a lab appointment with the Cancer Center - please note that after April 8th, 2024, all labs will be drawn in the cancer center.  You do not have to check in or register with the main entrance as you have in the past but will complete your check-in in the cancer center.  Wear comfortable clothing and clothing appropriate for easy access to any Portacath or PICC line.   We strive to give you quality time with your provider. You may need to reschedule your appointment if you arrive late (15 or more minutes).  Arriving late affects you and other patients whose appointments are after yours.  Also, if you miss three or more appointments without notifying the office, you may be dismissed from the clinic at the provider's discretion.      For prescription refill requests, have your pharmacy contact our office and allow 72 hours for refills to be completed.    Today you had your pump disconnected per protocol.    To help prevent nausea and vomiting after your treatment, we encourage you to take your nausea medication as directed.  BELOW ARE SYMPTOMS THAT SHOULD BE REPORTED IMMEDIATELY: *FEVER GREATER THAN 100.4 F (38 C) OR HIGHER *CHILLS OR SWEATING *NAUSEA AND VOMITING THAT IS NOT CONTROLLED WITH YOUR NAUSEA MEDICATION *UNUSUAL SHORTNESS OF BREATH *UNUSUAL BRUISING OR BLEEDING *URINARY PROBLEMS (pain or burning when urinating, or frequent urination) *BOWEL PROBLEMS (unusual diarrhea, constipation, pain near the anus) TENDERNESS IN MOUTH AND THROAT WITH OR WITHOUT PRESENCE OF ULCERS (sore throat, sores in mouth, or a toothache) UNUSUAL RASH, SWELLING OR PAIN  UNUSUAL VAGINAL DISCHARGE OR ITCHING   Items with * indicate a potential emergency and should be followed up as soon as possible or go to the Emergency Department if any problems  should occur.  Please show the CHEMOTHERAPY ALERT CARD or IMMUNOTHERAPY ALERT CARD at check-in to the Emergency Department and triage nurse.  Should you have questions after your visit or need to cancel or reschedule your appointment, please contact [facility] [phone]  and follow the prompts.  Office hours are 8:00 a.m. to 4:30 p.m. Monday - Friday. Please note that voicemails left after 4:00 p.m. may not be returned until the following business day.  We are closed weekends and major holidays. You have access to a nurse at all times for urgent questions. Please call the main number to the clinic [phone] and follow the prompts.  For any non-urgent questions, you may also contact your provider using MyChart. We now offer e-Visits for anyone 27 and older to request care online for non-urgent symptoms. For details visit [email or web address].   Also download the MyChart app! Go to the app store, search "MyChart", open the app, select [place], and log in with your MyChart username and password.

## 2022-10-14 NOTE — Progress Notes (Signed)
Amb. Pump disconnected today per orders. Patient tolerated it well without problems. Vitals stable and discharged home from clinic ambulatory. Follow up as scheduled.

## 2022-11-01 NOTE — Progress Notes (Signed)
[facility] [address] [place] [address]    Clinic Day:  11/02/2022  Referring physician: [name], [name]  Patient Care Team: [name], [name] as PCP - General (Family Medicine) [name], MD as Medical Oncologist (Medical Oncology) [name], RN as Oncology Nurse Navigator (Medical Oncology)   ASSESSMENT & PLAN:   Assessment: 1. T2 N0 M0 pancreatic adenocarcinoma: - She presented with painless jaundice in April 2023.  Weight loss of 13 pounds since April. - Stent placement on 09/10/2021 for a bilirubin of 8.2. - 10/03/2021: ERCP and stent removal and stent placement for stent dysfunction with total bilirubin 16.3. - 10/29/2021: ERCP/EUS: Lower CBD stent stenosis from stricture, status post metal stent placement.  EUS showed mass in the superior pancreatic head measuring 2.8 x 2.2 cm, suggesting abutment of the portal vein.  Intact interface was seen between lesion and SMA and celiac trunk suggesting lack of invasion.  Hypoechoic 14 x 12 mm lesion visualized in the liver.  Biopsy could not be attempted due to presence of blood vessel. - She had syncopal episodes in 07-08-2022, had pacemaker placement for bradycardia. - Pathology: Pancreatic head FNA and biliary stricture brushing: Malignant cells consistent with adenocarcinoma. - CA 19-9: 131 - We reviewed MRI of the abdomen with and without contrast (12/24/2021): Pancreatic head mass measures 2.7 x 2.3 cm (2.5 x 1.8 cm previously).  Mass is abutting and slightly displacing the SMA.  No obvious fat plane is demonstrated.  Portal and splenic veins are patent.  Stable small scattered hepatic cysts with no liver metastatic disease. - PET scan (11/20/2021): Ill-defined low-level hypermetabolic activity about the metallic CBD stent and pancreatic uncinate process without discrete CT correlate.  No convincing evidence of hypermetabolic disease in the neck, chest, abdomen or pelvis.  Tiny bilateral  lung nodules measuring up to 3 mm.  -CT pancreatic protocol (01/16/2022): Hypoenhancing mass in the pancreatic head measuring 2.9 x 1.8 cm.  Slightly greater than 180 degrees of abutment with portal vein as well as SMA without definite vascular encasement.  New enhancing 9 mm soft tissue nodule along the posterior aspect of the gallbladder wall?  Suspicious for peritoneal implant. -I have discussed with Dr. Freida Busman who has seen this patient. -Dr. Freida Busman thought that this patient might not be a candidate for surgery but she would like to evaluate after 3 to 4 months of neoadjuvant chemotherapy. -We talked about neoadjuvant chemotherapy.  She is not a candidate for FOLFIRINOX.  I have recommended gemcitabine and Abraxane every other week.  Cycle 1 was started on 02/05/2022.  - I have discussed NGS test results.  Guardant360 did not show MSI high.  No other targetable mutations.  Limited tissue NGS testing did not show any targetable mutations.  MS-stable.  TMB-low.   2. Social/family history: - She lives at home with her husband who has dementia.  She worked as a Conservation officer, nature for 33 years.  Non-smoker and no exposure to chemicals.  She has not been driving since July 08, 2022. - Daughter died of glioblastoma.  Mother had ovarian cancer.  Maternal aunt had breast cancer.    Plan: T2 N0 M0 pancreatic adenocarcinoma: - She tolerated last cycle of FOLFOX with 20% dose reduction very well. - She reported dry mouth and cold sensitivity which lasted about 3 to 4 days. - Denies any tingling or numbness in the extremities. - Today her white count is 20.2.  No clear source of infection.  She did not  receive G-CSF.  Other labs show elevated ALT of 92.  Creatinine is 1.24 and BUN 39.  Rest of the LFTs are normal. - CA 19-9 has improved to 244. - Recommend proceeding with next cycle today with 20% dose reduction.  RTC 2 weeks for follow-up.  Will plan to repeat scans after cycle 6.  2.  Weight loss: - Continue Megace  400 mg twice daily.  Weight is stable. - She is drinking 1 can of Ensure high-protein per day or 2 cans of regular boost per day.   3.  Normocytic anemia: - Last Venofer on 07/31/2022.  Hemoglobin today is 11.3.    Orders Placed This Encounter  Procedures   CT Chest W Contrast    Standing Status:   Future    Standing Expiration Date:   11/02/2023    Order Specific Question:   If indicated for the ordered procedure, I authorize the administration of contrast media per Radiology protocol    Answer:   Yes    Order Specific Question:   Does the patient have a contrast media/X-ray dye allergy?    Answer:   No    Order Specific Question:   Preferred imaging location?    Answer:   [facility]   CT ABD PELVIS W/WO CM ONCOLOGY PANCREATIC PROTOCOL    Standing Status:   Future    Standing Expiration Date:   11/02/2023    Order Specific Question:   If indicated for the ordered procedure, I authorize the administration of contrast media per Radiology protocol    Answer:   Yes    Order Specific Question:   Does the patient have a contrast media/X-ray dye allergy?    Answer:   No    Order Specific Question:   Preferred imaging location?    Answer:   [facility]    Order Specific Question:   If indicated for the ordered procedure, I authorize the administration of oral contrast media per Radiology protocol    Answer:   Yes      I,Katie Daubenspeck,acting as a scribe for [name], MD.,have documented all relevant documentation on the behalf of [name], MD,as directed by  [name], MD while in the presence of [name], MD.   I, [name] MD, have reviewed the above documentation for accuracy and completeness, and I agree with the above.    [name], MD   6/3/20245:38 PM  CHIEF COMPLAINT:   Diagnosis: pancreatic adenocarcinoma    Cancer Staging  Pancreatic carcinoma [facility]) Staging form: Exocrine Pancreas, AJCC  8th Edition - Clinical stage from 11/11/2021: Stage IB (cT2, cN0, cM0) - Unsigned    Prior Therapy: Gemcitabine and Abraxane   Current Therapy:  FOLFOX    HISTORY OF PRESENT ILLNESS:   Oncology History  Pancreatic carcinoma (HCC)  11/11/2021 Initial Diagnosis   Pancreatic carcinoma (HCC)   02/05/2022 - 07/02/2022 Chemotherapy   Patient is on Treatment Plan : PANCREATIC Abraxane D1,15 + Gemcitabine D1,15 q28d     08/31/2022 -  Chemotherapy   Patient is on Treatment Plan : PANCREAS Modified FOLFIRINOX q14d x 4 cycles        INTERVAL HISTORY:   Meagan [name] is a 88 y.o. female presenting to clinic today for follow up of pancreatic adenocarcinoma. She was last seen by me on 10/12/22.  Today, she states that she is doing well overall. Her appetite level is at 100%. Her energy level is at 50%.  PAST MEDICAL HISTORY:  Past Medical History: Past Medical History:  Diagnosis Date   Essential tremor    GERD (gastroesophageal reflux disease)    High cholesterol    Hypertension    PONV (postoperative nausea and vomiting)    Port-A-Cath in place 01/27/2022   Vertigo     Surgical History: Past Surgical History:  Procedure Laterality Date   BILIARY BRUSHING  09/10/2021   Procedure: BILIARY BRUSHING;  Surgeon: Hilarie Fredrickson, MD;  Location: [facility] ENDOSCOPY;  Service: Gastroenterology;;   BILIARY BRUSHING  10/29/2021   Procedure: BILIARY BRUSHING;  Surgeon: Lemar Lofty., MD;  Location: [facility] ENDOSCOPY;  Service: Gastroenterology;;   BILIARY DILATION  10/29/2021   Procedure: BILIARY DILATION;  Surgeon: Lemar Lofty., MD;  Location: [facility] ENDOSCOPY;  Service: Gastroenterology;;   BILIARY STENT PLACEMENT  09/10/2021   Procedure: BILIARY STENT PLACEMENT;  Surgeon: Hilarie Fredrickson, MD;  [facility] ENDOSCOPY;  Service: Gastroenterology;;   BILIARY STENT PLACEMENT N/A 10/03/2021   Procedure: BILIARY STENT PLACEMENT;  Surgeon: Iva Boop, MD;  Location: [facility] ENDOSCOPY;  Service:  Gastroenterology;  Laterality: N/A;   BILIARY STENT PLACEMENT N/A 10/29/2021   Procedure: BILIARY STENT PLACEMENT;  Surgeon: Meridee Score Netty Starring., MD;  Location: [facility] ENDOSCOPY;  Service: Gastroenterology;  Laterality: N/A;   BILIARY STENT PLACEMENT N/A 05/08/2022   Procedure: BILIARY STENT PLACEMENT;  Surgeon: Iva Boop, MD;  Location: [facility] ENDOSCOPY;  Service: Gastroenterology;  Laterality: N/A;   BIOPSY  10/29/2021   Procedure: BIOPSY;  Surgeon: Meridee Score Netty Starring., MD;  Location: [facility] ENDOSCOPY;  Service: Gastroenterology;;   ENDOSCOPIC RETROGRADE CHOLANGIOPANCREATOGRAPHY (ERCP) WITH PROPOFOL N/A 10/29/2021   Procedure: ENDOSCOPIC RETROGRADE CHOLANGIOPANCREATOGRAPHY (ERCP) WITH PROPOFOL;  Surgeon: Lemar Lofty., MD;  Location: [facility] ENDOSCOPY;  Service: Gastroenterology;  Laterality: N/A;   ERCP N/A 09/10/2021   Procedure: ENDOSCOPIC RETROGRADE CHOLANGIOPANCREATOGRAPHY (ERCP);  Surgeon: Hilarie Fredrickson, MD;  Location: [facility] ENDOSCOPY;  Service: Gastroenterology;  Laterality: N/A;   ERCP N/A 10/03/2021   Procedure: ENDOSCOPIC RETROGRADE CHOLANGIOPANCREATOGRAPHY (ERCP);  Surgeon: Iva Boop, MD;  Location: [facility] ENDOSCOPY;  Service: Gastroenterology;  Laterality: N/A;   ERCP N/A 05/08/2022   Procedure: ENDOSCOPIC RETROGRADE CHOLANGIOPANCREATOGRAPHY (ERCP);  Surgeon: Iva Boop, MD;  Location: [facility] ENDOSCOPY;  Service: Gastroenterology;  Laterality: N/A;   ESOPHAGOGASTRODUODENOSCOPY (EGD) WITH PROPOFOL N/A 10/29/2021   Procedure: ESOPHAGOGASTRODUODENOSCOPY (EGD) WITH PROPOFOL;  Surgeon: Meridee Score Netty Starring., MD;  Location: [facility] ENDOSCOPY;  Service: Gastroenterology;  Laterality: N/A;   EUS N/A 10/29/2021   Procedure: UPPER ENDOSCOPIC ULTRASOUND (EUS) RADIAL;  Surgeon: Lemar Lofty., MD;  Location: [facility] ENDOSCOPY;  Service: Gastroenterology;  Laterality: N/A;   FINE NEEDLE ASPIRATION  10/29/2021   Procedure: FINE NEEDLE ASPIRATION (FNA) LINEAR;  Surgeon: Lemar Lofty., MD;   Location: [facility] ENDOSCOPY;  Service: Gastroenterology;;   IR IMAGING GUIDED PORT INSERTION  01/28/2022   PACEMAKER IMPLANT     REMOVAL OF STONES  10/29/2021   Procedure: REMOVAL OF SLUDGE;  Surgeon: Lemar Lofty., MD;  Location: [facility] ENDOSCOPY;  Service: Gastroenterology;;   Meagan [name]  09/10/2021   Procedure: Meagan [name];  Surgeon: Hilarie Fredrickson, MD;  Location: [facility] ENDOSCOPY;  Service: Gastroenterology;;   Burman Freestone CHOLANGIOSCOPY N/A 10/29/2021   Procedure: [id number] CHOLANGIOSCOPY;  Surgeon: Lemar Lofty., MD;  Location: [facility] ENDOSCOPY;  Service: Gastroenterology;  Laterality: N/A;   STENT REMOVAL  10/03/2021   Procedure: STENT REMOVAL;  Surgeon: Iva Boop, MD;  Location: [facility] ENDOSCOPY;  Service: Gastroenterology;;    Social History: Social History   Socioeconomic  History   Marital status: Widowed    Spouse name: Not on file   Number of children: Not on file   Years of education: Not on file   Highest education level: Not on file  Occupational History   Not on file  Tobacco Use   Smoking status: Never   Smokeless tobacco: Never  Vaping Use   Vaping Use: Never used  Substance and Sexual Activity   Alcohol use: Never   Drug use: Never   Sexual activity: Not on file  Other Topics Concern   Not on file  Social History Narrative   Not on file   Social Determinants of Health   Financial Resource Strain: Not on file  Food Insecurity: No Food Insecurity (05/07/2022)   Hunger Vital Sign    Worried About Running Out of Food in the Last Year: Never true    Ran Out of Food in the Last Year: Never true  Transportation Needs: No Transportation Needs (05/07/2022)   PRAPARE - Administrator, Civil Service (Medical): No    Lack of Transportation (Non-Medical): No  Physical Activity: Not on file  Stress: Not on file  Social Connections: Not on file  Intimate Partner Violence: Not At Risk (05/07/2022)   Humiliation, Afraid, Rape, and Kick questionnaire     Fear of Current or Ex-Partner: No    Emotionally Abused: No    Physically Abused: No    Sexually Abused: No    Family History: Family History  Problem Relation Age of Onset   Stomach cancer Neg Hx    Esophageal cancer Neg Hx    Colon cancer Neg Hx    Pancreatic cancer Neg Hx     Current Medications:  Current Outpatient Medications:    amLODipine (NORVASC) 5 MG tablet, 1 tab(s) orally once a day for 90 days, Disp: , Rfl:    aspirin EC 81 MG tablet, Take 81 mg by mouth daily. Swallow whole., Disp: , Rfl:    Cholecalciferol (VITAMIN D3) 50 MCG (2000 UT) TABS, Take 2,000 Units by mouth daily after supper., Disp: , Rfl:    Coenzyme Q10 (CO Q-10) 100 MG CAPS, Take 100 mg by mouth every evening., Disp: , Rfl:    famotidine (PEPCID) 40 MG tablet, Take 40 mg by mouth every evening., Disp: , Rfl:    gabapentin (NEURONTIN) 100 MG capsule, Take 100 mg by mouth 2 (two) times daily., Disp: , Rfl:    meclizine (ANTIVERT) 25 MG tablet, Take 25 mg by mouth daily as needed for dizziness., Disp: , Rfl:    Misc Natural Products (JOINT SUPPORT) CAPS, Take 1 capsule by mouth daily with breakfast., Disp: , Rfl:    Multiple Vitamin (MULTIVITAMIN) tablet, Take 1 tablet by mouth daily with breakfast., Disp: , Rfl:    Multiple Vitamins-Minerals (OCUVITE EYE HEALTH FORMULA) CAPS, Take 1 capsule by mouth daily., Disp: , Rfl:    nebivolol (BYSTOLIC) 5 MG tablet, Take 2.5 mg by mouth in the morning., Disp: , Rfl:    omeprazole (PRILOSEC) 40 MG capsule, Take 40 mg by mouth daily before breakfast., Disp: , Rfl:    polyethylene glycol (MIRALAX) 17 g packet, Take 17 g by mouth daily as needed. (Patient taking differently: Take 17 g by mouth daily as needed for moderate constipation.), Disp: 14 each, Rfl: 0   Polyvinyl Alcohol-Povidone (REFRESH OP), Place 1 drop into both eyes daily as needed (tired eyes)., Disp: , Rfl:    predniSONE (DELTASONE) 50 MG tablet,  Take 13 hours, 7 hours and 1 hour prior to CT scan, Disp: 3  tablet, Rfl: 0   primidone (MYSOLINE) 50 MG tablet, Take 25 mg by mouth daily as needed (for tremors)., Disp: , Rfl:    pyridOXINE (VITAMIN B-6) 100 MG tablet, Take 50 mg by mouth in the morning., Disp: , Rfl:    tiZANidine (ZANAFLEX) 2 MG tablet, Take 1 mg by mouth daily as needed for muscle spasms., Disp: , Rfl:    megestrol (MEGACE) 400 MG/10ML suspension, Take 10 mLs (400 mg total) by mouth 2 (two) times daily., Disp: 480 mL, Rfl: 3 No current facility-administered medications for this visit.  Facility-Administered Medications Ordered in Other Visits:    fluorouracil (ADRUCIL) 3,000 mg in sodium chloride 0.9 % 90 mL chemo infusion, 1,920 mg/m2 (Treatment Plan Recorded), Intravenous, 1 day or 1 dose, [name], MD, Infusion Verify at 11/02/22 1608   Allergies: Allergies  Allergen Reactions   Ivp Dye [Iodinated Contrast Media] Hives   Lisinopril Cough    REVIEW OF SYSTEMS:   Review of Systems  Constitutional:  Negative for chills, fatigue and fever.  HENT:   Negative for lump/mass, mouth sores, nosebleeds, sore throat and trouble swallowing.   Eyes:  Negative for eye problems.  Respiratory:  Negative for cough and shortness of breath.   Cardiovascular:  Negative for chest pain, leg swelling and palpitations.  Gastrointestinal:  Negative for abdominal pain, constipation, diarrhea, nausea and vomiting.  Genitourinary:  Negative for bladder incontinence, difficulty urinating, dysuria, frequency, hematuria and nocturia.   Musculoskeletal:  Negative for arthralgias, back pain, flank pain, myalgias and neck pain.  Skin:  Negative for itching and rash.  Neurological:  Negative for dizziness, headaches and numbness.  Hematological:  Does not bruise/bleed easily.  Psychiatric/Behavioral:  Negative for depression, sleep disturbance and suicidal ideas. The patient is not nervous/anxious.   All other systems reviewed and are negative.    VITALS:   There were no vitals taken for  this visit.  Wt Readings from Last 3 Encounters:  11/02/22 118 lb 3.2 oz (53.6 kg)  10/12/22 114 lb 9.6 oz (52 kg)  09/28/22 113 lb 4.8 oz (51.4 kg)    There is no height or weight on file to calculate BMI.  Performance status (ECOG): 1 - Symptomatic but completely ambulatory  PHYSICAL EXAM:   Physical Exam Vitals and nursing note reviewed. Exam conducted with a chaperone present.  Constitutional:      Appearance: Normal appearance.  Cardiovascular:     Rate and Rhythm: Normal rate and regular rhythm.     Pulses: Normal pulses.     Heart sounds: Normal heart sounds.  Pulmonary:     Effort: Pulmonary effort is normal.     Breath sounds: Normal breath sounds.  Abdominal:     Palpations: Abdomen is soft. There is no hepatomegaly, splenomegaly or mass.     Tenderness: There is no abdominal tenderness.  Musculoskeletal:     Right lower leg: No edema.     Left lower leg: No edema.  Lymphadenopathy:     Cervical: No cervical adenopathy.     Right cervical: No superficial, deep or posterior cervical adenopathy.    Left cervical: No superficial, deep or posterior cervical adenopathy.     Upper Body:     Right upper body: No supraclavicular or axillary adenopathy.     Left upper body: No supraclavicular or axillary adenopathy.  Neurological:     General: No focal deficit present.  Mental Status: She is alert and oriented to person, place, and time.  Psychiatric:        Mood and Affect: Mood normal.        Behavior: Behavior normal.     LABS:      Latest Ref Rng & Units 11/02/2022    9:05 AM 10/12/2022   10:04 AM 09/28/2022    9:34 AM  CBC  WBC 4.0 - 10.5 K/uL 20.2  7.3  6.2   Hemoglobin 12.0 - 15.0 g/dL 16.1  09.6  04.5   Hematocrit 36.0 - 46.0 % 35.0  34.0  33.3   Platelets 150 - 400 K/uL 305  253  230       Latest Ref Rng & Units 11/02/2022    9:05 AM 10/12/2022   10:04 AM 09/28/2022    9:34 AM  CMP  Glucose 70 - 99 mg/dL 409  811  914   BUN 8 - 23 mg/dL 39  32  38    Creatinine 0.44 - 1.00 mg/dL 7.82  9.56  2.13   Sodium 135 - 145 mmol/L 133  132  135   Potassium 3.5 - 5.1 mmol/L 4.8  4.2  4.3   Chloride 98 - 111 mmol/L 105  106  104   CO2 22 - 32 mmol/L 20  19  22    Calcium 8.9 - 10.3 mg/dL 9.3  9.0  9.6   Total Protein 6.5 - 8.1 g/dL 6.3  6.4  6.4   Total Bilirubin 0.3 - 1.2 mg/dL 0.7  0.6  0.6   Alkaline Phos 38 - 126 U/L 203  115  254   AST 15 - 41 U/L 30  21  37   ALT 0 - 44 U/L 92  35  100      No results found for: "CEA1", "CEA" / No results found for: "CEA1", "CEA" No results found for: "PSA1" Lab Results  Component Value Date   YQM578 244 (H) 10/12/2022   No results found for: "CAN125"  No results found for: "TOTALPROTELP", "ALBUMINELP", "A1GS", "A2GS", "BETS", "BETA2SER", "GAMS", "MSPIKE", "SPEI" Lab Results  Component Value Date   TIBC 438 06/18/2022   FERRITIN 32 06/18/2022   IRONPCTSAT 13 06/18/2022   No results found for: "LDH"   STUDIES:   No results found.

## 2022-11-02 ENCOUNTER — Inpatient Hospital Stay: Payer: Medicare HMO | Admitting: Dietician

## 2022-11-02 ENCOUNTER — Inpatient Hospital Stay (HOSPITAL_BASED_OUTPATIENT_CLINIC_OR_DEPARTMENT_OTHER): Payer: Medicare HMO | Admitting: Hematology

## 2022-11-02 ENCOUNTER — Inpatient Hospital Stay: Payer: Medicare HMO | Attending: Hematology

## 2022-11-02 ENCOUNTER — Inpatient Hospital Stay: Payer: Medicare HMO

## 2022-11-02 VITALS — BP 153/73 | HR 86 | Temp 97.7°F | Resp 18

## 2022-11-02 DIAGNOSIS — Z95828 Presence of other vascular implants and grafts: Secondary | ICD-10-CM

## 2022-11-02 DIAGNOSIS — D649 Anemia, unspecified: Secondary | ICD-10-CM | POA: Insufficient documentation

## 2022-11-02 DIAGNOSIS — C259 Malignant neoplasm of pancreas, unspecified: Secondary | ICD-10-CM | POA: Diagnosis not present

## 2022-11-02 DIAGNOSIS — R918 Other nonspecific abnormal finding of lung field: Secondary | ICD-10-CM | POA: Diagnosis not present

## 2022-11-02 DIAGNOSIS — Z5111 Encounter for antineoplastic chemotherapy: Secondary | ICD-10-CM | POA: Insufficient documentation

## 2022-11-02 DIAGNOSIS — C250 Malignant neoplasm of head of pancreas: Secondary | ICD-10-CM | POA: Insufficient documentation

## 2022-11-02 LAB — CBC WITH DIFFERENTIAL/PLATELET
Abs Immature Granulocytes: 1.6 10*3/uL — ABNORMAL HIGH (ref 0.00–0.07)
Band Neutrophils: 4 %
Basophils Absolute: 0 10*3/uL (ref 0.0–0.1)
Basophils Relative: 0 %
Eosinophils Absolute: 0.2 10*3/uL (ref 0.0–0.5)
Eosinophils Relative: 1 %
HCT: 35 % — ABNORMAL LOW (ref 36.0–46.0)
Hemoglobin: 11.3 g/dL — ABNORMAL LOW (ref 12.0–15.0)
Lymphocytes Relative: 24 %
Lymphs Abs: 4.8 10*3/uL — ABNORMAL HIGH (ref 0.7–4.0)
MCH: 32.7 pg (ref 26.0–34.0)
MCHC: 32.3 g/dL (ref 30.0–36.0)
MCV: 101.2 fL — ABNORMAL HIGH (ref 80.0–100.0)
Metamyelocytes Relative: 6 %
Monocytes Absolute: 1 10*3/uL (ref 0.1–1.0)
Monocytes Relative: 5 %
Myelocytes: 2 %
Neutro Abs: 12.5 10*3/uL — ABNORMAL HIGH (ref 1.7–7.7)
Neutrophils Relative %: 58 %
Platelets: 305 10*3/uL (ref 150–400)
RBC: 3.46 MIL/uL — ABNORMAL LOW (ref 3.87–5.11)
RDW: 19.5 % — ABNORMAL HIGH (ref 11.5–15.5)
WBC: 20.2 10*3/uL — ABNORMAL HIGH (ref 4.0–10.5)
nRBC: 0 % (ref 0.0–0.2)

## 2022-11-02 LAB — COMPREHENSIVE METABOLIC PANEL
ALT: 92 U/L — ABNORMAL HIGH (ref 0–44)
AST: 30 U/L (ref 15–41)
Albumin: 2.9 g/dL — ABNORMAL LOW (ref 3.5–5.0)
Alkaline Phosphatase: 203 U/L — ABNORMAL HIGH (ref 38–126)
Anion gap: 8 (ref 5–15)
BUN: 39 mg/dL — ABNORMAL HIGH (ref 8–23)
CO2: 20 mmol/L — ABNORMAL LOW (ref 22–32)
Calcium: 9.3 mg/dL (ref 8.9–10.3)
Chloride: 105 mmol/L (ref 98–111)
Creatinine, Ser: 1.24 mg/dL — ABNORMAL HIGH (ref 0.44–1.00)
GFR, Estimated: 42 mL/min — ABNORMAL LOW (ref >60)
Glucose, Bld: 113 mg/dL — ABNORMAL HIGH (ref 70–99)
Potassium: 4.8 mmol/L (ref 3.5–5.1)
Sodium: 133 mmol/L — ABNORMAL LOW (ref 135–145)
Total Bilirubin: 0.7 mg/dL (ref 0.3–1.2)
Total Protein: 6.3 g/dL — ABNORMAL LOW (ref 6.5–8.1)

## 2022-11-02 LAB — MAGNESIUM: Magnesium: 1.8 mg/dL (ref 1.7–2.4)

## 2022-11-02 MED ORDER — DEXTROSE 5 % IV SOLN: Freq: Once | INTRAVENOUS | Status: AC

## 2022-11-02 MED ORDER — SODIUM CHLORIDE 0.9% FLUSH
10.0000 mL | Freq: Once | INTRAVENOUS | Status: AC
  Administered 2022-11-02: 10 mL via INTRAVENOUS

## 2022-11-02 MED ORDER — SODIUM CHLORIDE 0.9 % IV SOLN
150.0000 mg | Freq: Once | INTRAVENOUS | Status: AC
  Administered 2022-11-02: 150 mg via INTRAVENOUS
  Filled 2022-11-02: qty 150

## 2022-11-02 MED ORDER — OXALIPLATIN CHEMO INJECTION 100 MG/20ML
68.0000 mg/m2 | Freq: Once | INTRAVENOUS | Status: AC
  Administered 2022-11-02: 100 mg via INTRAVENOUS
  Filled 2022-11-02: qty 20

## 2022-11-02 MED ORDER — SODIUM CHLORIDE 0.9 % IV SOLN
1920.0000 mg/m2 | INTRAVENOUS | Status: DC
  Administered 2022-11-02: 3000 mg via INTRAVENOUS
  Filled 2022-11-02: qty 60

## 2022-11-02 MED ORDER — SODIUM CHLORIDE 0.9 % IV SOLN
320.0000 mg/m2 | Freq: Once | INTRAVENOUS | Status: AC
  Administered 2022-11-02: 500 mg via INTRAVENOUS
  Filled 2022-11-02: qty 25

## 2022-11-02 MED ORDER — MEGESTROL ACETATE 400 MG/10ML PO SUSP: 400.0000 mg | Freq: Two times a day (BID) | ORAL | 3 refills | Status: AC

## 2022-11-02 MED ORDER — PALONOSETRON HCL INJECTION 0.25 MG/5ML
0.2500 mg | Freq: Once | INTRAVENOUS | Status: AC
  Administered 2022-11-02: 0.25 mg via INTRAVENOUS
  Filled 2022-11-02: qty 5

## 2022-11-02 MED ORDER — SODIUM CHLORIDE 0.9 % IV SOLN
120.0000 mg/m2 | Freq: Once | INTRAVENOUS | Status: AC
  Administered 2022-11-02: 200 mg via INTRAVENOUS
  Filled 2022-11-02: qty 10

## 2022-11-02 MED ORDER — SODIUM CHLORIDE 0.9 % IV SOLN
10.0000 mg | Freq: Once | INTRAVENOUS | Status: AC
  Administered 2022-11-02: 10 mg via INTRAVENOUS
  Filled 2022-11-02: qty 1

## 2022-11-02 NOTE — Progress Notes (Addendum)
Proceed with treatment today including Irinotecan with ALT 92.  Fluorouracil CIV diluted in NS. NS Lot:  [id number], Exp:  01/30/2024   Richardean Sale, RPH, BCPS, BCOP 11/02/2022 3:12 PM

## 2022-11-02 NOTE — Progress Notes (Signed)
Patient has been examined by Dr. Katragadda. Vital signs and labs have been reviewed by MD - ANC, Creatinine, LFTs, hemoglobin, and platelets are within treatment parameters per M.D. - pt may proceed with treatment.  Primary RN and pharmacy notified.  

## 2022-11-02 NOTE — Progress Notes (Signed)
Nutrition Follow-up:  Patient with pancreatic adenocarcinoma. She is currently receiving modified Folfirinox q14d.   Met with patient in infusion. Daughter is present for visit today. Patient reports overall doing well. She has a good appetite. States she stays hungry. Patient is taking megace for appetite. Recalls egg, slice of bacon, applesauce, cup of greek yogurt for breakfast. Had half of chicken sub and fruit cup for dinner. Patient is drinking 1-2 Ensure/Boost. She denies nutrition impact symptoms.    Medications: reviewed   Labs: glucose 113, Na 133, BUN 39, Cr 1.24, albumin 2.9  Anthropometrics: Wt 118 lb 3.2 oz today increased   5/13 - 114 lb 9.6 oz  4/29 - 113 lb 4.8 oz   NUTRITION DIAGNOSIS: Unintended weight loss improved     INTERVENTION:  Continue drinking Ensure Plus/equivalent BID for added calories and protein     MONITORING, EVALUATION, GOAL: weight trends, intake   NEXT VISIT: To be scheduled as needed. Patient has contact information

## 2022-11-02 NOTE — Progress Notes (Signed)
Patient presents today for chemotherapy infusion. Patient is in satisfactory condition with no new complaints voiced.  Vital signs are stable.  Labs reviewed by Dr. Ellin Saba during the office visit.  ALT is 92 today.  All other labs are within treatment parameters.  We will proceed with treatment per MD orders.  Patient tolerated treatment well with no complaints voiced.  Home infusion 5FU pump connected with no issues.  Patient left ambulatory in stable condition.  Vital signs stable at discharge.  Follow up as scheduled.

## 2022-11-02 NOTE — Patient Instructions (Signed)
[facility] at [facility] Discharge Instructions   You were seen and examined today by Dr. Katragadda.  He reviewed the results of your lab work which are normal/stable.   We will proceed with your treatment today.  Return as scheduled.    Thank you for choosing [facility] at [facility] to provide your oncology and hematology care.  To afford each patient quality time with our provider, please arrive at least 15 minutes before your scheduled appointment time.   If you have a lab appointment with the Cancer Center please come in thru the Main Entrance and check in at the main information desk.  You need to re-schedule your appointment should you arrive 10 or more minutes late.  We strive to give you quality time with our providers, and arriving late affects you and other patients whose appointments are after yours.  Also, if you no show three or more times for appointments you may be dismissed from the clinic at the providers discretion.     Again, thank you for choosing [facility].  Our hope is that these requests will decrease the amount of time that you wait before being seen by our physicians.       _____________________________________________________________  Should you have questions after your visit to [facility], please contact our office at [phone] and follow the prompts.  Our office hours are 8:00 a.m. and 4:30 p.m. Monday - Friday.  Please note that voicemails left after 4:00 p.m. may not be returned until the following business day.  We are closed weekends and major holidays.  You do have access to a nurse 24-7, just call the main number to the clinic [phone] and do not press any options, hold on the line and a nurse will answer the phone.    For prescription refill requests, have your pharmacy contact our office and allow 72 hours.    Due to Covid, you will need to wear a mask upon entering  the hospital. If you do not have a mask, a mask will be given to you at the Main Entrance upon arrival. For doctor visits, patients may have 1 support person age 18 or older with them. For treatment visits, patients can not have anyone with them due to social distancing guidelines and our immunocompromised population.      

## 2022-11-02 NOTE — Addendum Note (Signed)
Addended by: Neita Goodnight on: 11/02/2022 04:31 PM   Modules accepted: Orders

## 2022-11-02 NOTE — Patient Instructions (Signed)
[facility]  Discharge Instructions: Thank you for choosing [facility] to provide your oncology and hematology care.  If you have a lab appointment with the Cancer Center - please note that after April 8th, 2024, all labs will be drawn in the cancer center.  You do not have to check in or register with the main entrance as you have in the past but will complete your check-in in the cancer center.  Wear comfortable clothing and clothing appropriate for easy access to any Portacath or PICC line.   We strive to give you quality time with your provider. You may need to reschedule your appointment if you arrive late (15 or more minutes).  Arriving late affects you and other patients whose appointments are after yours.  Also, if you miss three or more appointments without notifying the office, you may be dismissed from the clinic at the provider's discretion.      For prescription refill requests, have your pharmacy contact our office and allow 72 hours for refills to be completed.    Today you received the following chemotherapy and/or immunotherapy agents Oxaliplatin/Leucovorin/Irinotecan/5FU.   Oxaliplatin Injection What is this medication? OXALIPLATIN (ox AL i PLA tin) treats colorectal cancer. It works by slowing down the growth of cancer cells. This medicine may be used for other purposes; ask your health care provider or pharmacist if you have questions. COMMON BRAND NAME(S): Eloxatin What should I tell my care team before I take this medication? They need to know if you have any of these conditions: Heart disease History of irregular heartbeat or rhythm Liver disease Low blood cell levels (white cells, red cells, and platelets) Lung or breathing disease, such as asthma Take medications that treat or prevent blood clots Tingling of the fingers, toes, or other nerve disorder An unusual or allergic reaction to oxaliplatin, other medications, foods, dyes,  or preservatives If you or your partner are pregnant or trying to get pregnant Breast-feeding How should I use this medication? This medication is injected into a vein. It is given by your care team in a hospital or clinic setting. Talk to your care team about the use of this medication in children. Special care may be needed. Overdosage: If you think you have taken too much of this medicine contact a poison control center or emergency room at once. NOTE: This medicine is only for you. Do not share this medicine with others. What if I miss a dose? Keep appointments for follow-up doses. It is important not to miss a dose. Call your care team if you are unable to keep an appointment. What may interact with this medication? Do not take this medication with any of the following: Cisapride Dronedarone Pimozide Thioridazine This medication may also interact with the following: Aspirin and aspirin-like medications Certain medications that treat or prevent blood clots, such as warfarin, apixaban, dabigatran, and rivaroxaban Cisplatin Cyclosporine Diuretics Medications for infection, such as acyclovir, adefovir, amphotericin B, bacitracin, cidofovir, foscarnet, ganciclovir, gentamicin, pentamidine, vancomycin NSAIDs, medications for pain and inflammation, such as ibuprofen or naproxen Other medications that cause heart rhythm changes Pamidronate Zoledronic acid This list may not describe all possible interactions. Give your health care provider a list of all the medicines, herbs, non-prescription drugs, or dietary supplements you use. Also tell them if you smoke, drink alcohol, or use illegal drugs. Some items may interact with your medicine. What should I watch for while using this medication? Your condition will be monitored carefully  while you are receiving this medication. You may need blood work while taking this medication. This medication may make you feel generally unwell. This is not  uncommon as chemotherapy can affect healthy cells as well as cancer cells. Report any side effects. Continue your course of treatment even though you feel ill unless your care team tells you to stop. This medication may increase your risk of getting an infection. Call your care team for advice if you get a fever, chills, sore throat, or other symptoms of a cold or flu. Do not treat yourself. Try to avoid being around people who are sick. Avoid taking medications that contain aspirin, acetaminophen, ibuprofen, naproxen, or ketoprofen unless instructed by your care team. These medications may hide a fever. Be careful brushing or flossing your teeth or using a toothpick because you may get an infection or bleed more easily. If you have any dental work done, tell your dentist you are receiving this medication. This medication can make you more sensitive to cold. Do not drink cold drinks or use ice. Cover exposed skin before coming in contact with cold temperatures or cold objects. When out in cold weather wear warm clothing and cover your mouth and nose to warm the air that goes into your lungs. Tell your care team if you get sensitive to the cold. Talk to your care team if you or your partner are pregnant or think either of you might be pregnant. This medication can cause serious birth defects if taken during pregnancy and for 9 months after the last dose. A negative pregnancy test is required before starting this medication. A reliable form of contraception is recommended while taking this medication and for 9 months after the last dose. Talk to your care team about effective forms of contraception. Do not father a child while taking this medication and for 6 months after the last dose. Use a condom while having sex during this time period. Do not breastfeed while taking this medication and for 3 months after the last dose. This medication may cause infertility. Talk to your care team if you are concerned about  your fertility. What side effects may I notice from receiving this medication? Side effects that you should report to your care team as soon as possible: Allergic reactions--skin rash, itching, hives, swelling of the face, lips, tongue, or throat Bleeding--bloody or black, tar-like stools, vomiting blood or Jonas Goh material that looks like coffee grounds, red or dark Tiaunna Buford urine, small red or purple spots on skin, unusual bruising or bleeding Dry cough, shortness of breath or trouble breathing Heart rhythm changes--fast or irregular heartbeat, dizziness, feeling faint or lightheaded, chest pain, trouble breathing Infection--fever, chills, cough, sore throat, wounds that don't heal, pain or trouble when passing urine, general feeling of discomfort or being unwell Liver injury--right upper belly pain, loss of appetite, nausea, light-colored stool, dark yellow or Mariette Cowley urine, yellowing skin or eyes, unusual weakness or fatigue Low red blood cell level--unusual weakness or fatigue, dizziness, headache, trouble breathing Muscle injury--unusual weakness or fatigue, muscle pain, dark yellow or Eloise Mula urine, decrease in amount of urine Pain, tingling, or numbness in the hands or feet Sudden and severe headache, confusion, change in vision, seizures, which may be signs of posterior reversible encephalopathy syndrome (PRES) Unusual bruising or bleeding Side effects that usually do not require medical attention (report to your care team if they continue or are bothersome): Diarrhea Nausea Pain, redness, or swelling with sores inside the mouth or throat Unusual weakness  or fatigue Vomiting This list may not describe all possible side effects. Call your doctor for medical advice about side effects. You may report side effects to FDA at 1-800-FDA-1088. Where should I keep my medication? This medication is given in a hospital or clinic. It will not be stored at home. NOTE: This sheet is a summary. It may not  cover all possible information. If you have questions about this medicine, talk to your doctor, pharmacist, or health care provider.  2024 Elsevier/Gold Standard (2022-07-26 00:00:00)    Leucovorin Injection What is this medication? LEUCOVORIN (loo koe VOR in) prevents side effects from certain medications, such as methotrexate. It works by increasing folate levels. This helps protect healthy cells in your body. It may also be used to treat anemia caused by low levels of folate. It can also be used with fluorouracil, a type of chemotherapy, to treat colorectal cancer. It works by increasing the effects of fluorouracil in the body. This medicine may be used for other purposes; ask your health care provider or pharmacist if you have questions. What should I tell my care team before I take this medication? They need to know if you have any of these conditions: Anemia from low levels of vitamin B12 in the blood An unusual or allergic reaction to leucovorin, folic acid, other medications, foods, dyes, or preservatives Pregnant or trying to get pregnant Breastfeeding How should I use this medication? This medication is injected into a vein or a muscle. It is given by your care team in a hospital or clinic setting. Talk to your care team about the use of this medication in children. Special care may be needed. Overdosage: If you think you have taken too much of this medicine contact a poison control center or emergency room at once. NOTE: This medicine is only for you. Do not share this medicine with others. What if I miss a dose? Keep appointments for follow-up doses. It is important not to miss your dose. Call your care team if you are unable to keep an appointment. What may interact with this medication? Capecitabine Fluorouracil Phenobarbital Phenytoin Primidone Trimethoprim;sulfamethoxazole This list may not describe all possible interactions. Give your health care provider a list of all  the medicines, herbs, non-prescription drugs, or dietary supplements you use. Also tell them if you smoke, drink alcohol, or use illegal drugs. Some items may interact with your medicine. What should I watch for while using this medication? Your condition will be monitored carefully while you are receiving this medication. This medication may increase the side effects of 5-fluorouracil. Tell your care team if you have diarrhea or mouth sores that do not get better or that get worse. What side effects may I notice from receiving this medication? Side effects that you should report to your care team as soon as possible: Allergic reactions--skin rash, itching, hives, swelling of the face, lips, tongue, or throat This list may not describe all possible side effects. Call your doctor for medical advice about side effects. You may report side effects to FDA at 1-800-FDA-1088. Where should I keep my medication? This medication is given in a hospital or clinic. It will not be stored at home. NOTE: This sheet is a summary. It may not cover all possible information. If you have questions about this medicine, talk to your doctor, pharmacist, or health care provider.  2024 Elsevier/Gold Standard (2021-10-21 00:00:00)    Irinotecan Injection What is this medication? IRINOTECAN (ir in oh TEE kan) treats some  types of cancer. It works by slowing down the growth of cancer cells. This medicine may be used for other purposes; ask your health care provider or pharmacist if you have questions. COMMON BRAND NAME(S): Camptosar What should I tell my care team before I take this medication? They need to know if you have any of these conditions: Dehydration Diarrhea Infection, especially a viral infection, such as chickenpox, cold sores, herpes Liver disease Low blood cell levels (white cells, red cells, and platelets) Low levels of electrolytes, such as calcium, magnesium, or potassium in your blood Recent or  ongoing radiation An unusual or allergic reaction to irinotecan, other medications, foods, dyes, or preservatives If you or your partner are pregnant or trying to get pregnant Breast-feeding How should I use this medication? This medication is injected into a vein. It is given by your care team in a hospital or clinic setting. Talk to your care team about the use of this medication in children. Special care may be needed. Overdosage: If you think you have taken too much of this medicine contact a poison control center or emergency room at once. NOTE: This medicine is only for you. Do not share this medicine with others. What if I miss a dose? Keep appointments for follow-up doses. It is important not to miss your dose. Call your care team if you are unable to keep an appointment. What may interact with this medication? Do not take this medication with any of the following: Cobicistat Itraconazole This medication may also interact with the following: Certain antibiotics, such as clarithromycin, rifampin, rifabutin Certain antivirals for HIV or AIDS Certain medications for fungal infections, such as ketoconazole, posaconazole, voriconazole Certain medications for seizures, such as carbamazepine, phenobarbital, phenytoin Gemfibrozil Nefazodone St. John's wort This list may not describe all possible interactions. Give your health care provider a list of all the medicines, herbs, non-prescription drugs, or dietary supplements you use. Also tell them if you smoke, drink alcohol, or use illegal drugs. Some items may interact with your medicine. What should I watch for while using this medication? Your condition will be monitored carefully while you are receiving this medication. You may need blood work while taking this medication. This medication may make you feel generally unwell. This is not uncommon as chemotherapy can affect healthy cells as well as cancer cells. Report any side effects.  Continue your course of treatment even though you feel ill unless your care team tells you to stop. This medication can cause serious side effects. To reduce the risk, your care team may give you other medications to take before receiving this one. Be sure to follow the directions from your care team. This medication may affect your coordination, reaction time, or judgement. Do not drive or operate machinery until you know how this medication affects you. Sit up or stand slowly to reduce the risk of dizzy or fainting spells. Drinking alcohol with this medication can increase the risk of these side effects. This medication may increase your risk of getting an infection. Call your care team for advice if you get a fever, chills, sore throat, or other symptoms of a cold or flu. Do not treat yourself. Try to avoid being around people who are sick. Avoid taking medications that contain aspirin, acetaminophen, ibuprofen, naproxen, or ketoprofen unless instructed by your care team. These medications may hide a fever. This medication may increase your risk to bruise or bleed. Call your care team if you notice any unusual bleeding. Be careful  brushing or flossing your teeth or using a toothpick because you may get an infection or bleed more easily. If you have any dental work done, tell your dentist you are receiving this medication. Talk to your care team if you or your partner are pregnant or think either of you might be pregnant. This medication can cause serious birth defects if taken during pregnancy and for 6 months after the last dose. You will need a negative pregnancy test before starting this medication. Contraception is recommended while taking this medication and for 6 months after the last dose. Your care team can help you find the option that works for you. Do not father a child while taking this medication and for 3 months after the last dose. Use a condom for contraception during this time period. Do  not breastfeed while taking this medication and for 7 days after the last dose. This medication may cause infertility. Talk to your care team if you are concerned about your fertility. What side effects may I notice from receiving this medication? Side effects that you should report to your care team as soon as possible: Allergic reactions--skin rash, itching, hives, swelling of the face, lips, tongue, or throat Dry cough, shortness of breath or trouble breathing Increased saliva or tears, increased sweating, stomach cramping, diarrhea, small pupils, unusual weakness or fatigue, slow heartbeat Infection--fever, chills, cough, sore throat, wounds that don't heal, pain or trouble when passing urine, general feeling of discomfort or being unwell Kidney injury--decrease in the amount of urine, swelling of the ankles, hands, or feet Low red blood cell level--unusual weakness or fatigue, dizziness, headache, trouble breathing Severe or prolonged diarrhea Unusual bruising or bleeding Side effects that usually do not require medical attention (report to your care team if they continue or are bothersome): Constipation Diarrhea Hair loss Loss of appetite Nausea Stomach pain This list may not describe all possible side effects. Call your doctor for medical advice about side effects. You may report side effects to FDA at 1-800-FDA-1088. Where should I keep my medication? This medication is given in a hospital or clinic. It will not be stored at home. NOTE: This sheet is a summary. It may not cover all possible information. If you have questions about this medicine, talk to your doctor, pharmacist, or health care provider.  2024 Elsevier/Gold Standard (2021-09-29 00:00:00)    Fluorouracil Injection What is this medication? FLUOROURACIL (flure oh YOOR a sil) treats some types of cancer. It works by slowing down the growth of cancer cells. This medicine may be used for other purposes; ask your health  care provider or pharmacist if you have questions. COMMON BRAND NAME(S): Adrucil What should I tell my care team before I take this medication? They need to know if you have any of these conditions: Blood disorders Dihydropyrimidine dehydrogenase (DPD) deficiency Infection, such as chickenpox, cold sores, herpes Kidney disease Liver disease Poor nutrition Recent or ongoing radiation therapy An unusual or allergic reaction to fluorouracil, other medications, foods, dyes, or preservatives If you or your partner are pregnant or trying to get pregnant Breast-feeding How should I use this medication? This medication is injected into a vein. It is administered by your care team in a hospital or clinic setting. Talk to your care team about the use of this medication in children. Special care may be needed. Overdosage: If you think you have taken too much of this medicine contact a poison control center or emergency room at once. NOTE: This medicine is  only for you. Do not share this medicine with others. What if I miss a dose? Keep appointments for follow-up doses. It is important not to miss your dose. Call your care team if you are unable to keep an appointment. What may interact with this medication? Do not take this medication with any of the following: Live virus vaccines This medication may also interact with the following: Medications that treat or prevent blood clots, such as warfarin, enoxaparin, dalteparin This list may not describe all possible interactions. Give your health care provider a list of all the medicines, herbs, non-prescription drugs, or dietary supplements you use. Also tell them if you smoke, drink alcohol, or use illegal drugs. Some items may interact with your medicine. What should I watch for while using this medication? Your condition will be monitored carefully while you are receiving this medication. This medication may make you feel generally unwell. This is not  uncommon as chemotherapy can affect healthy cells as well as cancer cells. Report any side effects. Continue your course of treatment even though you feel ill unless your care team tells you to stop. In some cases, you may be given additional medications to help with side effects. Follow all directions for their use. This medication may increase your risk of getting an infection. Call your care team for advice if you get a fever, chills, sore throat, or other symptoms of a cold or flu. Do not treat yourself. Try to avoid being around people who are sick. This medication may increase your risk to bruise or bleed. Call your care team if you notice any unusual bleeding. Be careful brushing or flossing your teeth or using a toothpick because you may get an infection or bleed more easily. If you have any dental work done, tell your dentist you are receiving this medication. Avoid taking medications that contain aspirin, acetaminophen, ibuprofen, naproxen, or ketoprofen unless instructed by your care team. These medications may hide a fever. Do not treat diarrhea with over the counter products. Contact your care team if you have diarrhea that lasts more than 2 days or if it is severe and watery. This medication can make you more sensitive to the sun. Keep out of the sun. If you cannot avoid being in the sun, wear protective clothing and sunscreen. Do not use sun lamps, tanning beds, or tanning booths. Talk to your care team if you or your partner wish to become pregnant or think you might be pregnant. This medication can cause serious birth defects if taken during pregnancy and for 3 months after the last dose. A reliable form of contraception is recommended while taking this medication and for 3 months after the last dose. Talk to your care team about effective forms of contraception. Do not father a child while taking this medication and for 3 months after the last dose. Use a condom while having sex during this  time period. Do not breastfeed while taking this medication. This medication may cause infertility. Talk to your care team if you are concerned about your fertility. What side effects may I notice from receiving this medication? Side effects that you should report to your care team as soon as possible: Allergic reactions--skin rash, itching, hives, swelling of the face, lips, tongue, or throat Heart attack--pain or tightness in the chest, shoulders, arms, or jaw, nausea, shortness of breath, cold or clammy skin, feeling faint or lightheaded Heart failure--shortness of breath, swelling of the ankles, feet, or hands, sudden weight gain, unusual  weakness or fatigue Heart rhythm changes--fast or irregular heartbeat, dizziness, feeling faint or lightheaded, chest pain, trouble breathing High ammonia level--unusual weakness or fatigue, confusion, loss of appetite, nausea, vomiting, seizures Infection--fever, chills, cough, sore throat, wounds that don't heal, pain or trouble when passing urine, general feeling of discomfort or being unwell Low red blood cell level--unusual weakness or fatigue, dizziness, headache, trouble breathing Pain, tingling, or numbness in the hands or feet, muscle weakness, change in vision, confusion or trouble speaking, loss of balance or coordination, trouble walking, seizures Redness, swelling, and blistering of the skin over hands and feet Severe or prolonged diarrhea Unusual bruising or bleeding Side effects that usually do not require medical attention (report to your care team if they continue or are bothersome): Dry skin Headache Increased tears Nausea Pain, redness, or swelling with sores inside the mouth or throat Sensitivity to light Vomiting This list may not describe all possible side effects. Call your doctor for medical advice about side effects. You may report side effects to FDA at 1-800-FDA-1088. Where should I keep my medication? This medication is  given in a hospital or clinic. It will not be stored at home. NOTE: This sheet is a summary. It may not cover all possible information. If you have questions about this medicine, talk to your doctor, pharmacist, or health care provider.  2024 Elsevier/Gold Standard (2021-09-23 00:00:00)        To help prevent nausea and vomiting after your treatment, we encourage you to take your nausea medication as directed.  BELOW ARE SYMPTOMS THAT SHOULD BE REPORTED IMMEDIATELY: *FEVER GREATER THAN 100.4 F (38 C) OR HIGHER *CHILLS OR SWEATING *NAUSEA AND VOMITING THAT IS NOT CONTROLLED WITH YOUR NAUSEA MEDICATION *UNUSUAL SHORTNESS OF BREATH *UNUSUAL BRUISING OR BLEEDING *URINARY PROBLEMS (pain or burning when urinating, or frequent urination) *BOWEL PROBLEMS (unusual diarrhea, constipation, pain near the anus) TENDERNESS IN MOUTH AND THROAT WITH OR WITHOUT PRESENCE OF ULCERS (sore throat, sores in mouth, or a toothache) UNUSUAL RASH, SWELLING OR PAIN  UNUSUAL VAGINAL DISCHARGE OR ITCHING   Items with * indicate a potential emergency and should be followed up as soon as possible or go to the Emergency Department if any problems should occur.  Please show the CHEMOTHERAPY ALERT CARD or IMMUNOTHERAPY ALERT CARD at check-in to the Emergency Department and triage nurse.  Should you have questions after your visit or need to cancel or reschedule your appointment, please contact [facility] [phone]  and follow the prompts.  Office hours are 8:00 a.m. to 4:30 p.m. Monday - Friday. Please note that voicemails left after 4:00 p.m. may not be returned until the following business day.  We are closed weekends and major holidays. You have access to a nurse at all times for urgent questions. Please call the main number to the clinic [phone] and follow the prompts.  For any non-urgent questions, you may also contact your provider using MyChart. We now offer e-Visits for anyone 21 and  older to request care online for non-urgent symptoms. For details visit [email or web address].   Also download the MyChart app! Go to the app store, search "MyChart", open the app, select , and log in with your MyChart username and password.

## 2022-11-04 ENCOUNTER — Inpatient Hospital Stay: Payer: Medicare HMO

## 2022-11-04 DIAGNOSIS — C259 Malignant neoplasm of pancreas, unspecified: Secondary | ICD-10-CM

## 2022-11-04 DIAGNOSIS — Z95828 Presence of other vascular implants and grafts: Secondary | ICD-10-CM

## 2022-11-04 LAB — CANCER ANTIGEN 19-9: CA 19-9: 536 U/mL — ABNORMAL HIGH (ref 0–35)

## 2022-11-04 NOTE — Progress Notes (Signed)
Patient called Latrelle Dodrill RN on triage line pertaining to pump leaking. Patient had an appointment for pump d/c at 14:45 pm. Patient presents early to the clinic. Upon assessment needle out and in a plastic bag with pump and black bag. Pump was turned off by the patient . Patient still had the tegaderm covering the port site on and intact and states that in the night she felt a pinching sensation and when she took her bra off the entire needle was in her bra and noted to be wet. Tegaderm intact, white crystalized area above the tegaderm on the neck was visible. Patient states only her neck felt damp and her bra on the right side  where the port is located. Patient given another spill kit bag, Dr. Ellin Saba notified. 12.9 mls left in the bag for infusion. Patient's skin cleansed with soap and warm water. Skin integrity intact. No redness noted. Patient denies any pain or complaints of pain around port site. Vital signs stable and patient has no complaints. Verbal orders received discharge home from the clinic. Follow up with next visit.

## 2022-11-15 NOTE — Progress Notes (Signed)
[facility] [address], [place] [address]    Clinic Day:  11/16/2022  Referring physician: [name], [name]  Patient Care Team: [name], [name] as PCP - General (Family Medicine) [name], MD as Medical Oncologist (Medical Oncology) [name], RN as Oncology Nurse Navigator (Medical Oncology)   ASSESSMENT & PLAN:   Assessment: 1. T2 N0 M0 pancreatic adenocarcinoma: - She presented with painless jaundice in April 2023.  Weight loss of 13 pounds since April. - Stent placement on 09/10/2021 for a bilirubin of 8.2. - 10/03/2021: ERCP and stent removal and stent placement for stent dysfunction with total bilirubin 16.3. - 10/29/2021: ERCP/EUS: Lower CBD stent stenosis from stricture, status post metal stent placement.  EUS showed mass in the superior pancreatic head measuring 2.8 x 2.2 cm, suggesting abutment of the portal vein.  Intact interface was seen between lesion and SMA and celiac trunk suggesting lack of invasion.  Hypoechoic 14 x 12 mm lesion visualized in the liver.  Biopsy could not be attempted due to presence of blood vessel. - She had syncopal episodes in 2022-07-10, had pacemaker placement for bradycardia. - Pathology: Pancreatic head FNA and biliary stricture brushing: Malignant cells consistent with adenocarcinoma. - CA 19-9: 131 - We reviewed MRI of the abdomen with and without contrast (12/24/2021): Pancreatic head mass measures 2.7 x 2.3 cm (2.5 x 1.8 cm previously).  Mass is abutting and slightly displacing the SMA.  No obvious fat plane is demonstrated.  Portal and splenic veins are patent.  Stable small scattered hepatic cysts with no liver metastatic disease. - PET scan (11/20/2021): Ill-defined low-level hypermetabolic activity about the metallic CBD stent and pancreatic uncinate process without discrete CT correlate.  No convincing evidence of hypermetabolic disease in the neck, chest, abdomen or pelvis.  Tiny bilateral  lung nodules measuring up to 3 mm.  -CT pancreatic protocol (01/16/2022): Hypoenhancing mass in the pancreatic head measuring 2.9 x 1.8 cm.  Slightly greater than 180 degrees of abutment with portal vein as well as SMA without definite vascular encasement.  New enhancing 9 mm soft tissue nodule along the posterior aspect of the gallbladder wall?  Suspicious for peritoneal implant. -I have discussed with Dr. Freida Busman who has seen this patient. -Dr. Freida Busman thought that this patient might not be a candidate for surgery but she would like to evaluate after 3 to 4 months of neoadjuvant chemotherapy. -We talked about neoadjuvant chemotherapy.  She is not a candidate for FOLFIRINOX.  I have recommended gemcitabine and Abraxane every other week.  Cycle 1 was started on 02/05/2022.  - I have discussed NGS test results.  Guardant360 did not show MSI high.  No other targetable mutations.  Limited tissue NGS testing did not show any targetable mutations.  MS-stable.  TMB-low.   2. Social/family history: - She lives at home with her husband who has dementia.  She worked as a Conservation officer, nature for 33 years.  Non-smoker and no exposure to chemicals.  She has not been driving since Jul 10, 2022. - Daughter died of glioblastoma.  Mother had ovarian cancer.  Maternal aunt had breast cancer.    Plan: T2 N0 M0 pancreatic adenocarcinoma: - After last cycle of chemotherapy, she felt more weaker. - She had diarrhea for 2 to 3 days, up to 3 times per day.  She took Imodium which helped. - Labs today: Creatinine 1.14.  AST and ALT elevated at 47 and 125 respectively.  CBC was grossly normal. - She will  proceed with cycle 6 today without Irinotecan. - I will see her back after CT scan of the CAP.  If she has partial response/stable disease, we will proceed with chemoradiation therapy.  2.  Weight loss: - Continue Megace 400 mg twice daily.  Continue 1 can of Ensure high-protein per day or 2 cans of regular boost per day.  Weight is  stable.   3.  Normocytic anemia: - Last Venofer on 07/31/2022.  Hemoglobin today is 9.8.  Closely monitor.    No orders of the defined types were placed in this encounter.     I,Katie Daubenspeck,acting as a Neurosurgeon for [name], MD.,have documented all relevant documentation on the behalf of [name], MD,as directed by  [name], MD while in the presence of [name], MD.   I, [name] MD, have reviewed the above documentation for accuracy and completeness, and I agree with the above.   [name], MD   6/17/20244:43 PM  CHIEF COMPLAINT:   Diagnosis: pancreatic adenocarcinoma    Cancer Staging  Pancreatic carcinoma [facility]) Staging form: Exocrine Pancreas, AJCC 8th Edition - Clinical stage from 11/11/2021: Stage IB (cT2, cN0, cM0) - Unsigned    Prior Therapy: Gemcitabine and Abraxane   Current Therapy:  FOLFOX    HISTORY OF PRESENT ILLNESS:   Oncology History  Pancreatic carcinoma (HCC)  11/11/2021 Initial Diagnosis   Pancreatic carcinoma (HCC)   02/05/2022 - 07/02/2022 Chemotherapy   Patient is on Treatment Plan : PANCREATIC Abraxane D1,15 + Gemcitabine D1,15 q28d     08/31/2022 -  Chemotherapy   Patient is on Treatment Plan : PANCREAS Modified FOLFIRINOX q14d x 4 cycles        INTERVAL HISTORY:   Meagan [name] is a 88 y.o. female presenting to clinic today for follow up of pancreatic adenocarcinoma. She was last seen by me on 11/02/22.  Today, she states that she is doing well overall. Her appetite level is at 75%. Her energy level is at 10%.  PAST MEDICAL HISTORY:   Past Medical History: Past Medical History:  Diagnosis Date   Essential tremor    GERD (gastroesophageal reflux disease)    High cholesterol    Hypertension    PONV (postoperative nausea and vomiting)    Port-A-Cath in place 01/27/2022   Vertigo     Surgical History: Past Surgical History:  Procedure Laterality Date   BILIARY BRUSHING   09/10/2021   Procedure: BILIARY BRUSHING;  Surgeon: Hilarie Fredrickson, MD;  Location: [facility] ENDOSCOPY;  Service: Gastroenterology;;   BILIARY BRUSHING  10/29/2021   Procedure: BILIARY BRUSHING;  Surgeon: Lemar Lofty., MD;  Location: [facility] ENDOSCOPY;  Service: Gastroenterology;;   BILIARY DILATION  10/29/2021   Procedure: BILIARY DILATION;  Surgeon: Lemar Lofty., MD;  Location: [facility] ENDOSCOPY;  Service: Gastroenterology;;   BILIARY STENT PLACEMENT  09/10/2021   Procedure: BILIARY STENT PLACEMENT;  Surgeon: Hilarie Fredrickson, MD;  Location: [facility] ENDOSCOPY;  Service: Gastroenterology;;   BILIARY STENT PLACEMENT N/A 10/03/2021   Procedure: BILIARY STENT PLACEMENT;  Surgeon: Iva Boop, MD;  Location: [facility] ENDOSCOPY;  Service: Gastroenterology;  Laterality: N/A;   BILIARY STENT PLACEMENT N/A 10/29/2021   Procedure: BILIARY STENT PLACEMENT;  Surgeon: Meridee Score Netty Starring., MD;  Location: [facility] ENDOSCOPY;  Service: Gastroenterology;  Laterality: N/A;   BILIARY STENT PLACEMENT N/A 05/08/2022   Procedure: BILIARY STENT PLACEMENT;  Surgeon: Iva Boop, MD;  Location: [facility] ENDOSCOPY;  Service: Gastroenterology;  Laterality: N/A;   BIOPSY  10/29/2021  Procedure: BIOPSY;  Surgeon: Lemar Lofty., MD;  Location: [facility] ENDOSCOPY;  Service: Gastroenterology;;   ENDOSCOPIC RETROGRADE CHOLANGIOPANCREATOGRAPHY (ERCP) WITH PROPOFOL N/A 10/29/2021   Procedure: ENDOSCOPIC RETROGRADE CHOLANGIOPANCREATOGRAPHY (ERCP) WITH PROPOFOL;  Surgeon: Lemar Lofty., MD;  Location: [facility] ENDOSCOPY;  Service: Gastroenterology;  Laterality: N/A;   ERCP N/A 09/10/2021   Procedure: ENDOSCOPIC RETROGRADE CHOLANGIOPANCREATOGRAPHY (ERCP);  Surgeon: Hilarie Fredrickson, MD;  Location[facility] ENDOSCOPY;  Service: Gastroenterology;  Laterality: N/A;   ERCP N/A 10/03/2021   Procedure: ENDOSCOPIC RETROGRADE CHOLANGIOPANCREATOGRAPHY (ERCP);  Surgeon: Iva Boop, MD;  Location: [facility] ENDOSCOPY;  Service: Gastroenterology;  Laterality: N/A;    ERCP N/A 05/08/2022   Procedure: ENDOSCOPIC RETROGRADE CHOLANGIOPANCREATOGRAPHY (ERCP);  Surgeon: Iva Boop, MD;  Location: [facility] ENDOSCOPY;  Service: Gastroenterology;  Laterality: N/A;   ESOPHAGOGASTRODUODENOSCOPY (EGD) WITH PROPOFOL N/A 10/29/2021   Procedure: ESOPHAGOGASTRODUODENOSCOPY (EGD) WITH PROPOFOL;  Surgeon: Meridee Score Netty Starring., MD;  Location: [facility] ENDOSCOPY;  Service: Gastroenterology;  Laterality: N/A;   EUS N/A 10/29/2021   Procedure: UPPER ENDOSCOPIC ULTRASOUND (EUS) RADIAL;  Surgeon: Lemar Lofty., MD;  Location: [facility] ENDOSCOPY;  Service: Gastroenterology;  Laterality: N/A;   FINE NEEDLE ASPIRATION  10/29/2021   Procedure: FINE NEEDLE ASPIRATION (FNA) LINEAR;  Surgeon: Lemar Lofty., MD;  Location: [facility] ENDOSCOPY;  Service: Gastroenterology;;   IR IMAGING GUIDED PORT INSERTION  01/28/2022   PACEMAKER IMPLANT     REMOVAL OF STONES  10/29/2021   Procedure: REMOVAL OF SLUDGE;  Surgeon: Lemar Lofty., MD;  Location: [facility] ENDOSCOPY;  Service: Gastroenterology;;   Dennison Mascot  09/10/2021   Procedure: Dennison Mascot;  Surgeon: Hilarie Fredrickson, MD;  Location: [facility] ENDOSCOPY;  Service: Gastroenterology;;   Burman Freestone CHOLANGIOSCOPY N/A 10/29/2021   Procedure: [id number] CHOLANGIOSCOPY;  Surgeon: Lemar Lofty., MD;  Location: [facility] ENDOSCOPY;  Service: Gastroenterology;  Laterality: N/A;   STENT REMOVAL  10/03/2021   Procedure: STENT REMOVAL;  Surgeon: Iva Boop, MD;  Location: [facility] ENDOSCOPY;  Service: Gastroenterology;;    Social History: Social History   Socioeconomic History   Marital status: Widowed    Spouse name: Not on file   Number of children: Not on file   Years of education: Not on file   Highest education level: Not on file  Occupational History   Not on file  Tobacco Use   Smoking status: Never   Smokeless tobacco: Never  Vaping Use   Vaping Use: Never used  Substance and Sexual Activity   Alcohol use: Never   Drug use: Never    Sexual activity: Not on file  Other Topics Concern   Not on file  Social History Narrative   Not on file   Social Determinants of Health   Financial Resource Strain: Not on file  Food Insecurity: No Food Insecurity (05/07/2022)   Hunger Vital Sign    Worried About Running Out of Food in the Last Year: Never true    Ran Out of Food in the Last Year: Never true  Transportation Needs: No Transportation Needs (05/07/2022)   PRAPARE - Administrator, Civil Service (Medical): No    Lack of Transportation (Non-Medical): No  Physical Activity: Not on file  Stress: Not on file  Social Connections: Not on file  Intimate Partner Violence: Not At Risk (05/07/2022)   Humiliation, Afraid, Rape, and Kick questionnaire    Fear of Current or Ex-Partner: No    Emotionally Abused: No    Physically Abused: No    Sexually Abused: No  Family History: Family History  Problem Relation Age of Onset   Stomach cancer Neg Hx    Esophageal cancer Neg Hx    Colon cancer Neg Hx    Pancreatic cancer Neg Hx     Current Medications:  Current Outpatient Medications:    amLODipine (NORVASC) 5 MG tablet, 1 tab(s) orally once a day for 90 days, Disp: , Rfl:    aspirin EC 81 MG tablet, Take 81 mg by mouth daily. Swallow whole., Disp: , Rfl:    Cholecalciferol (VITAMIN D3) 50 MCG (2000 UT) TABS, Take 2,000 Units by mouth daily after supper., Disp: , Rfl:    Coenzyme Q10 (CO Q-10) 100 MG CAPS, Take 100 mg by mouth every evening., Disp: , Rfl:    famotidine (PEPCID) 40 MG tablet, Take 40 mg by mouth every evening., Disp: , Rfl:    gabapentin (NEURONTIN) 100 MG capsule, Take 100 mg by mouth 2 (two) times daily., Disp: , Rfl:    meclizine (ANTIVERT) 25 MG tablet, Take 25 mg by mouth daily as needed for dizziness., Disp: , Rfl:    megestrol (MEGACE) 400 MG/10ML suspension, Take 10 mLs (400 mg total) by mouth 2 (two) times daily., Disp: 480 mL, Rfl: 3   Misc Natural Products (JOINT SUPPORT) CAPS, Take 1  capsule by mouth daily with breakfast., Disp: , Rfl:    Multiple Vitamin (MULTIVITAMIN) tablet, Take 1 tablet by mouth daily with breakfast., Disp: , Rfl:    Multiple Vitamins-Minerals (OCUVITE EYE HEALTH FORMULA) CAPS, Take 1 capsule by mouth daily., Disp: , Rfl:    nebivolol (BYSTOLIC) 5 MG tablet, Take 2.5 mg by mouth in the morning., Disp: , Rfl:    omeprazole (PRILOSEC) 40 MG capsule, Take 40 mg by mouth daily before breakfast., Disp: , Rfl:    polyethylene glycol (MIRALAX) 17 g packet, Take 17 g by mouth daily as needed. (Patient taking differently: Take 17 g by mouth daily as needed for moderate constipation.), Disp: 14 each, Rfl: 0   Polyvinyl Alcohol-Povidone (REFRESH OP), Place 1 drop into both eyes daily as needed (tired eyes)., Disp: , Rfl:    primidone (MYSOLINE) 50 MG tablet, Take 25 mg by mouth daily as needed (for tremors)., Disp: , Rfl:    pyridOXINE (VITAMIN B-6) 100 MG tablet, Take 50 mg by mouth in the morning., Disp: , Rfl:    tiZANidine (ZANAFLEX) 2 MG tablet, Take 1 mg by mouth daily as needed for muscle spasms., Disp: , Rfl:    predniSONE (DELTASONE) 50 MG tablet, Take 13 hours, 7 hours and 1 hour prior to CT scan, Disp: 3 tablet, Rfl: 0 No current facility-administered medications for this visit.  Facility-Administered Medications Ordered in Other Visits:    fluorouracil (ADRUCIL) 3,000 mg in sodium chloride 0.9 % 90 mL chemo infusion, 1,920 mg/m2 (Treatment Plan Recorded), Intravenous, 1 day or 1 dose, [name], MD, Infusion Verify at 11/16/22 1452   Allergies: Allergies  Allergen Reactions   Ivp Dye [Iodinated Contrast Media] Hives   Lisinopril Cough    REVIEW OF SYSTEMS:   Review of Systems  Constitutional:  Negative for chills, fatigue and fever.  HENT:   Negative for lump/mass, mouth sores, nosebleeds, sore throat and trouble swallowing.   Eyes:  Negative for eye problems.  Respiratory:  Negative for cough and shortness of breath.    Cardiovascular:  Negative for chest pain, leg swelling and palpitations.  Gastrointestinal:  Positive for diarrhea. Negative for abdominal pain, constipation, nausea and vomiting.  Genitourinary:  Negative for bladder incontinence, difficulty urinating, dysuria, frequency, hematuria and nocturia.   Musculoskeletal:  Negative for arthralgias, back pain, flank pain, myalgias and neck pain.  Skin:  Negative for itching and rash.  Neurological:  Negative for dizziness, headaches and numbness.  Hematological:  Does not bruise/bleed easily.  Psychiatric/Behavioral:  Negative for depression, sleep disturbance and suicidal ideas. The patient is not nervous/anxious.   All other systems reviewed and are negative.    VITALS:   Weight 116 lb (52.6 kg).  Wt Readings from Last 3 Encounters:  11/16/22 116 lb (52.6 kg)  11/02/22 118 lb 3.2 oz (53.6 kg)  10/12/22 114 lb 9.6 oz (52 kg)    Body mass index is 18.72 kg/m.  Performance status (ECOG): 1 - Symptomatic but completely ambulatory  PHYSICAL EXAM:   Physical Exam Vitals and nursing note reviewed. Exam conducted with a chaperone present.  Constitutional:      Appearance: Normal appearance.  Cardiovascular:     Rate and Rhythm: Normal rate and regular rhythm.     Pulses: Normal pulses.     Heart sounds: Normal heart sounds.  Pulmonary:     Effort: Pulmonary effort is normal.     Breath sounds: Normal breath sounds.  Abdominal:     Palpations: Abdomen is soft. There is no hepatomegaly, splenomegaly or mass.     Tenderness: There is no abdominal tenderness.  Musculoskeletal:     Right lower leg: No edema.     Left lower leg: No edema.  Lymphadenopathy:     Cervical: No cervical adenopathy.     Right cervical: No superficial, deep or posterior cervical adenopathy.    Left cervical: No superficial, deep or posterior cervical adenopathy.     Upper Body:     Right upper body: No supraclavicular or axillary adenopathy.     Left upper  body: No supraclavicular or axillary adenopathy.  Neurological:     General: No focal deficit present.     Mental Status: She is alert and oriented to person, place, and time.  Psychiatric:        Mood and Affect: Mood normal.        Behavior: Behavior normal.     LABS:      Latest Ref Rng & Units 11/16/2022    9:23 AM 11/02/2022    9:05 AM 10/12/2022   10:04 AM  CBC  WBC 4.0 - 10.5 K/uL 5.0  20.2  7.3   Hemoglobin 12.0 - 15.0 g/dL 9.8  40.1  02.7   Hematocrit 36.0 - 46.0 % 29.8  35.0  34.0   Platelets 150 - 400 K/uL 296  305  253       Latest Ref Rng & Units 11/16/2022    9:23 AM 11/02/2022    9:05 AM 10/12/2022   10:04 AM  CMP  Glucose 70 - 99 mg/dL 253  664  403   BUN 8 - 23 mg/dL 40  39  32   Creatinine 0.44 - 1.00 mg/dL 4.74  2.59  5.63   Sodium 135 - 145 mmol/L 134  133  132   Potassium 3.5 - 5.1 mmol/L 4.2  4.8  4.2   Chloride 98 - 111 mmol/L 108  105  106   CO2 22 - 32 mmol/L 18  20  19    Calcium 8.9 - 10.3 mg/dL 9.1  9.3  9.0   Total Protein 6.5 - 8.1 g/dL 6.3  6.3  6.4   Total  Bilirubin 0.3 - 1.2 mg/dL 0.6  0.7  0.6   Alkaline Phos 38 - 126 U/L 238  203  115   AST 15 - 41 U/L 47  30  21   ALT 0 - 44 U/L 125  92  35      No results found for: "CEA1", "CEA" / No results found for: "CEA1", "CEA" No results found for: "PSA1" Lab Results  Component Value Date   ZOX096 536 (H) 11/02/2022   No results found for: "EAV409"  No results found for: "TOTALPROTELP", "ALBUMINELP", "A1GS", "A2GS", "BETS", "BETA2SER", "GAMS", "MSPIKE", "SPEI" Lab Results  Component Value Date   TIBC 438 06/18/2022   FERRITIN 32 06/18/2022   IRONPCTSAT 13 06/18/2022   No results found for: "LDH"   STUDIES:   No results found.

## 2022-11-16 ENCOUNTER — Inpatient Hospital Stay: Payer: Medicare HMO

## 2022-11-16 ENCOUNTER — Inpatient Hospital Stay (HOSPITAL_BASED_OUTPATIENT_CLINIC_OR_DEPARTMENT_OTHER): Payer: Medicare HMO | Admitting: Hematology

## 2022-11-16 DIAGNOSIS — C259 Malignant neoplasm of pancreas, unspecified: Secondary | ICD-10-CM

## 2022-11-16 DIAGNOSIS — Z95828 Presence of other vascular implants and grafts: Secondary | ICD-10-CM

## 2022-11-16 DIAGNOSIS — Z5111 Encounter for antineoplastic chemotherapy: Secondary | ICD-10-CM | POA: Diagnosis not present

## 2022-11-16 LAB — COMPREHENSIVE METABOLIC PANEL
ALT: 125 U/L — ABNORMAL HIGH (ref 0–44)
AST: 47 U/L — ABNORMAL HIGH (ref 15–41)
Albumin: 3.1 g/dL — ABNORMAL LOW (ref 3.5–5.0)
Alkaline Phosphatase: 238 U/L — ABNORMAL HIGH (ref 38–126)
Anion gap: 8 (ref 5–15)
BUN: 40 mg/dL — ABNORMAL HIGH (ref 8–23)
CO2: 18 mmol/L — ABNORMAL LOW (ref 22–32)
Calcium: 9.1 mg/dL (ref 8.9–10.3)
Chloride: 108 mmol/L (ref 98–111)
Creatinine, Ser: 1.14 mg/dL — ABNORMAL HIGH (ref 0.44–1.00)
GFR, Estimated: 47 mL/min — ABNORMAL LOW (ref >60)
Glucose, Bld: 116 mg/dL — ABNORMAL HIGH (ref 70–99)
Potassium: 4.2 mmol/L (ref 3.5–5.1)
Sodium: 134 mmol/L — ABNORMAL LOW (ref 135–145)
Total Bilirubin: 0.6 mg/dL (ref 0.3–1.2)
Total Protein: 6.3 g/dL — ABNORMAL LOW (ref 6.5–8.1)

## 2022-11-16 LAB — CBC WITH DIFFERENTIAL/PLATELET
Abs Immature Granulocytes: 0.05 10*3/uL (ref 0.00–0.07)
Basophils Absolute: 0 10*3/uL (ref 0.0–0.1)
Basophils Relative: 1 %
Eosinophils Absolute: 0.1 10*3/uL (ref 0.0–0.5)
Eosinophils Relative: 2 %
HCT: 29.8 % — ABNORMAL LOW (ref 36.0–46.0)
Hemoglobin: 9.8 g/dL — ABNORMAL LOW (ref 12.0–15.0)
Immature Granulocytes: 1 %
Lymphocytes Relative: 22 %
Lymphs Abs: 1.1 10*3/uL (ref 0.7–4.0)
MCH: 33.4 pg (ref 26.0–34.0)
MCHC: 32.9 g/dL (ref 30.0–36.0)
MCV: 101.7 fL — ABNORMAL HIGH (ref 80.0–100.0)
Monocytes Absolute: 1.1 10*3/uL — ABNORMAL HIGH (ref 0.1–1.0)
Monocytes Relative: 22 %
Neutro Abs: 2.7 10*3/uL (ref 1.7–7.7)
Neutrophils Relative %: 52 %
Platelets: 296 10*3/uL (ref 150–400)
RBC: 2.93 MIL/uL — ABNORMAL LOW (ref 3.87–5.11)
RDW: 18.7 % — ABNORMAL HIGH (ref 11.5–15.5)
WBC: 5 10*3/uL (ref 4.0–10.5)
nRBC: 0 % (ref 0.0–0.2)

## 2022-11-16 LAB — MAGNESIUM: Magnesium: 1.9 mg/dL (ref 1.7–2.4)

## 2022-11-16 MED ORDER — PREDNISONE 50 MG PO TABS: ORAL_TABLET | ORAL | 0 refills | Status: DC

## 2022-11-16 MED ORDER — SODIUM CHLORIDE 0.9 % IV SOLN
10.0000 mg | Freq: Once | INTRAVENOUS | Status: AC
  Administered 2022-11-16: 10 mg via INTRAVENOUS
  Filled 2022-11-16: qty 10

## 2022-11-16 MED ORDER — DEXTROSE 5 % IV SOLN: Freq: Once | INTRAVENOUS | Status: AC

## 2022-11-16 MED ORDER — OXALIPLATIN CHEMO INJECTION 100 MG/20ML
68.0000 mg/m2 | Freq: Once | INTRAVENOUS | Status: AC
  Administered 2022-11-16: 100 mg via INTRAVENOUS
  Filled 2022-11-16: qty 20

## 2022-11-16 MED ORDER — SODIUM CHLORIDE 0.9 % IV SOLN
1920.0000 mg/m2 | INTRAVENOUS | Status: DC
  Administered 2022-11-16: 3000 mg via INTRAVENOUS
  Filled 2022-11-16: qty 60

## 2022-11-16 MED ORDER — PALONOSETRON HCL INJECTION 0.25 MG/5ML
0.2500 mg | Freq: Once | INTRAVENOUS | Status: AC
  Administered 2022-11-16: 0.25 mg via INTRAVENOUS
  Filled 2022-11-16: qty 5

## 2022-11-16 MED ORDER — SODIUM CHLORIDE 0.9 % IV SOLN
150.0000 mg | Freq: Once | INTRAVENOUS | Status: AC
  Administered 2022-11-16: 150 mg via INTRAVENOUS
  Filled 2022-11-16: qty 150

## 2022-11-16 MED ORDER — SODIUM CHLORIDE 0.9% FLUSH
10.0000 mL | INTRAVENOUS | Status: DC | PRN
  Administered 2022-11-16: 10 mL via INTRAVENOUS

## 2022-11-16 MED ORDER — LEUCOVORIN CALCIUM INJECTION 350 MG
320.0000 mg/m2 | Freq: Once | INTRAVENOUS | Status: AC
  Administered 2022-11-16: 500 mg via INTRAVENOUS
  Filled 2022-11-16: qty 25

## 2022-11-16 NOTE — Patient Instructions (Signed)
[facility] [place]  Discharge Instructions: Thank you for choosing [facility] to provide your oncology and hematology care.  If you have a lab appointment with the Cancer Center - please note that after April 8th, 2024, all labs will be drawn in the cancer center.  You do not have to check in or register with the main entrance as you have in the past but will complete your check-in in the cancer center.  Wear comfortable clothing and clothing appropriate for easy access to any Portacath or PICC line.   We strive to give you quality time with your provider. You may need to reschedule your appointment if you arrive late (15 or more minutes).  Arriving late affects you and other patients whose appointments are after yours.  Also, if you miss three or more appointments without notifying the office, you may be dismissed from the clinic at the provider's discretion.      For prescription refill requests, have your pharmacy contact our office and allow 72 hours for refills to be completed.    Today you received the following chemotherapy and/or immunotherapy agents Folfox   To help prevent nausea and vomiting after your treatment, we encourage you to take your nausea medication as directed.  Oxaliplatin Injection What is this medication? OXALIPLATIN (ox AL i PLA tin) treats colorectal cancer. It works by slowing down the growth of cancer cells. This medicine may be used for other purposes; ask your health care provider or pharmacist if you have questions. COMMON BRAND NAME(S): Eloxatin What should I tell my care team before I take this medication? They need to know if you have any of these conditions: Heart disease History of irregular heartbeat or rhythm Liver disease Low blood cell levels (white cells, red cells, and platelets) Lung or breathing disease, such as asthma Take medications that treat or prevent blood clots Tingling of the fingers, toes, or other nerve  disorder An unusual or allergic reaction to oxaliplatin, other medications, foods, dyes, or preservatives If you or your partner are pregnant or trying to get pregnant Breast-feeding How should I use this medication? This medication is injected into a vein. It is given by your care team in a hospital or clinic setting. Talk to your care team about the use of this medication in children. Special care may be needed. Overdosage: If you think you have taken too much of this medicine contact a poison control center or emergency room at once. NOTE: This medicine is only for you. Do not share this medicine with others. What if I miss a dose? Keep appointments for follow-up doses. It is important not to miss a dose. Call your care team if you are unable to keep an appointment. What may interact with this medication? Do not take this medication with any of the following: Cisapride Dronedarone Pimozide Thioridazine This medication may also interact with the following: Aspirin and aspirin-like medications Certain medications that treat or prevent blood clots, such as warfarin, apixaban, dabigatran, and rivaroxaban Cisplatin Cyclosporine Diuretics Medications for infection, such as acyclovir, adefovir, amphotericin B, bacitracin, cidofovir, foscarnet, ganciclovir, gentamicin, pentamidine, vancomycin NSAIDs, medications for pain and inflammation, such as ibuprofen or naproxen Other medications that cause heart rhythm changes Pamidronate Zoledronic acid This list may not describe all possible interactions. Give your health care provider a list of all the medicines, herbs, non-prescription drugs, or dietary supplements you use. Also tell them if you smoke, drink alcohol, or use illegal drugs. Some items   may interact with your medicine. What should I watch for while using this medication? Your condition will be monitored carefully while you are receiving this medication. You may need blood work while  taking this medication. This medication may make you feel generally unwell. This is not uncommon as chemotherapy can affect healthy cells as well as cancer cells. Report any side effects. Continue your course of treatment even though you feel ill unless your care team tells you to stop. This medication may increase your risk of getting an infection. Call your care team for advice if you get a fever, chills, sore throat, or other symptoms of a cold or flu. Do not treat yourself. Try to avoid being around people who are sick. Avoid taking medications that contain aspirin, acetaminophen, ibuprofen, naproxen, or ketoprofen unless instructed by your care team. These medications may hide a fever. Be careful brushing or flossing your teeth or using a toothpick because you may get an infection or bleed more easily. If you have any dental work done, tell your dentist you are receiving this medication. This medication can make you more sensitive to cold. Do not drink cold drinks or use ice. Cover exposed skin before coming in contact with cold temperatures or cold objects. When out in cold weather wear warm clothing and cover your mouth and nose to warm the air that goes into your lungs. Tell your care team if you get sensitive to the cold. Talk to your care team if you or your partner are pregnant or think either of you might be pregnant. This medication can cause serious birth defects if taken during pregnancy and for 9 months after the last dose. A negative pregnancy test is required before starting this medication. A reliable form of contraception is recommended while taking this medication and for 9 months after the last dose. Talk to your care team about effective forms of contraception. Do not father a child while taking this medication and for 6 months after the last dose. Use a condom while having sex during this time period. Do not breastfeed while taking this medication and for 3 months after the last  dose. This medication may cause infertility. Talk to your care team if you are concerned about your fertility. What side effects may I notice from receiving this medication? Side effects that you should report to your care team as soon as possible: Allergic reactions--skin rash, itching, hives, swelling of the face, lips, tongue, or throat Bleeding--bloody or black, tar-like stools, vomiting blood or brown material that looks like coffee grounds, red or dark brown urine, small red or purple spots on skin, unusual bruising or bleeding Dry cough, shortness of breath or trouble breathing Heart rhythm changes--fast or irregular heartbeat, dizziness, feeling faint or lightheaded, chest pain, trouble breathing Infection--fever, chills, cough, sore throat, wounds that don't heal, pain or trouble when passing urine, general feeling of discomfort or being unwell Liver injury--right upper belly pain, loss of appetite, nausea, light-colored stool, dark yellow or brown urine, yellowing skin or eyes, unusual weakness or fatigue Low red blood cell level--unusual weakness or fatigue, dizziness, headache, trouble breathing Muscle injury--unusual weakness or fatigue, muscle pain, dark yellow or brown urine, decrease in amount of urine Pain, tingling, or numbness in the hands or feet Sudden and severe headache, confusion, change in vision, seizures, which may be signs of posterior reversible encephalopathy syndrome (PRES) Unusual bruising or bleeding Side effects that usually do not require medical attention (report to your care team if   they continue or are bothersome): Diarrhea Nausea Pain, redness, or swelling with sores inside the mouth or throat Unusual weakness or fatigue Vomiting This list may not describe all possible side effects. Call your doctor for medical advice about side effects. You may report side effects to FDA at 1-800-FDA-1088. Where should I keep my medication? This medication is given in a  hospital or clinic. It will not be stored at home. NOTE: This sheet is a summary. It may not cover all possible information. If you have questions about this medicine, talk to your doctor, pharmacist, or health care provider.  2024 Elsevier/Gold Standard (2022-07-26 00:00:00)   Leucovorin Injection What is this medication? LEUCOVORIN (loo koe VOR in) prevents side effects from certain medications, such as methotrexate. It works by increasing folate levels. This helps protect healthy cells in your body. It may also be used to treat anemia caused by low levels of folate. It can also be used with fluorouracil, a type of chemotherapy, to treat colorectal cancer. It works by increasing the effects of fluorouracil in the body. This medicine may be used for other purposes; ask your health care provider or pharmacist if you have questions. What should I tell my care team before I take this medication? They need to know if you have any of these conditions: Anemia from low levels of vitamin B12 in the blood An unusual or allergic reaction to leucovorin, folic acid, other medications, foods, dyes, or preservatives Pregnant or trying to get pregnant Breastfeeding How should I use this medication? This medication is injected into a vein or a muscle. It is given by your care team in a hospital or clinic setting. Talk to your care team about the use of this medication in children. Special care may be needed. Overdosage: If you think you have taken too much of this medicine contact a poison control center or emergency room at once. NOTE: This medicine is only for you. Do not share this medicine with others. What if I miss a dose? Keep appointments for follow-up doses. It is important not to miss your dose. Call your care team if you are unable to keep an appointment. What may interact with this medication? Capecitabine Fluorouracil Phenobarbital Phenytoin Primidone Trimethoprim;sulfamethoxazole This  list may not describe all possible interactions. Give your health care provider a list of all the medicines, herbs, non-prescription drugs, or dietary supplements you use. Also tell them if you smoke, drink alcohol, or use illegal drugs. Some items may interact with your medicine. What should I watch for while using this medication? Your condition will be monitored carefully while you are receiving this medication. This medication may increase the side effects of 5-fluorouracil. Tell your care team if you have diarrhea or mouth sores that do not get better or that get worse. What side effects may I notice from receiving this medication? Side effects that you should report to your care team as soon as possible: Allergic reactions--skin rash, itching, hives, swelling of the face, lips, tongue, or throat This list may not describe all possible side effects. Call your doctor for medical advice about side effects. You may report side effects to FDA at 1-800-FDA-1088. Where should I keep my medication? This medication is given in a hospital or clinic. It will not be stored at home. NOTE: This sheet is a summary. It may not cover all possible information. If you have questions about this medicine, talk to your doctor, pharmacist, or health care provider.  2024 Elsevier/Gold Standard (  2021-10-21 00:00:00)  Fluorouracil Injection What is this medication? FLUOROURACIL (flure oh YOOR a sil) treats some types of cancer. It works by slowing down the growth of cancer cells. This medicine may be used for other purposes; ask your health care provider or pharmacist if you have questions. COMMON BRAND NAME(S): Adrucil What should I tell my care team before I take this medication? They need to know if you have any of these conditions: Blood disorders Dihydropyrimidine dehydrogenase (DPD) deficiency Infection, such as chickenpox, cold sores, herpes Kidney disease Liver disease Poor nutrition Recent or ongoing  radiation therapy An unusual or allergic reaction to fluorouracil, other medications, foods, dyes, or preservatives If you or your partner are pregnant or trying to get pregnant Breast-feeding How should I use this medication? This medication is injected into a vein. It is administered by your care team in a hospital or clinic setting. Talk to your care team about the use of this medication in children. Special care may be needed. Overdosage: If you think you have taken too much of this medicine contact a poison control center or emergency room at once. NOTE: This medicine is only for you. Do not share this medicine with others. What if I miss a dose? Keep appointments for follow-up doses. It is important not to miss your dose. Call your care team if you are unable to keep an appointment. What may interact with this medication? Do not take this medication with any of the following: Live virus vaccines This medication may also interact with the following: Medications that treat or prevent blood clots, such as warfarin, enoxaparin, dalteparin This list may not describe all possible interactions. Give your health care provider a list of all the medicines, herbs, non-prescription drugs, or dietary supplements you use. Also tell them if you smoke, drink alcohol, or use illegal drugs. Some items may interact with your medicine. What should I watch for while using this medication? Your condition will be monitored carefully while you are receiving this medication. This medication may make you feel generally unwell. This is not uncommon as chemotherapy can affect healthy cells as well as cancer cells. Report any side effects. Continue your course of treatment even though you feel ill unless your care team tells you to stop. In some cases, you may be given additional medications to help with side effects. Follow all directions for their use. This medication may increase your risk of getting an infection.  Call your care team for advice if you get a fever, chills, sore throat, or other symptoms of a cold or flu. Do not treat yourself. Try to avoid being around people who are sick. This medication may increase your risk to bruise or bleed. Call your care team if you notice any unusual bleeding. Be careful brushing or flossing your teeth or using a toothpick because you may get an infection or bleed more easily. If you have any dental work done, tell your dentist you are receiving this medication. Avoid taking medications that contain aspirin, acetaminophen, ibuprofen, naproxen, or ketoprofen unless instructed by your care team. These medications may hide a fever. Do not treat diarrhea with over the counter products. Contact your care team if you have diarrhea that lasts more than 2 days or if it is severe and watery. This medication can make you more sensitive to the sun. Keep out of the sun. If you cannot avoid being in the sun, wear protective clothing and sunscreen. Do not use sun lamps, tanning beds, or  tanning booths. Talk to your care team if you or your partner wish to become pregnant or think you might be pregnant. This medication can cause serious birth defects if taken during pregnancy and for 3 months after the last dose. A reliable form of contraception is recommended while taking this medication and for 3 months after the last dose. Talk to your care team about effective forms of contraception. Do not father a child while taking this medication and for 3 months after the last dose. Use a condom while having sex during this time period. Do not breastfeed while taking this medication. This medication may cause infertility. Talk to your care team if you are concerned about your fertility. What side effects may I notice from receiving this medication? Side effects that you should report to your care team as soon as possible: Allergic reactions--skin rash, itching, hives, swelling of the face, lips,  tongue, or throat Heart attack--pain or tightness in the chest, shoulders, arms, or jaw, nausea, shortness of breath, cold or clammy skin, feeling faint or lightheaded Heart failure--shortness of breath, swelling of the ankles, feet, or hands, sudden weight gain, unusual weakness or fatigue Heart rhythm changes--fast or irregular heartbeat, dizziness, feeling faint or lightheaded, chest pain, trouble breathing High ammonia level--unusual weakness or fatigue, confusion, loss of appetite, nausea, vomiting, seizures Infection--fever, chills, cough, sore throat, wounds that don't heal, pain or trouble when passing urine, general feeling of discomfort or being unwell Low red blood cell level--unusual weakness or fatigue, dizziness, headache, trouble breathing Pain, tingling, or numbness in the hands or feet, muscle weakness, change in vision, confusion or trouble speaking, loss of balance or coordination, trouble walking, seizures Redness, swelling, and blistering of the skin over hands and feet Severe or prolonged diarrhea Unusual bruising or bleeding Side effects that usually do not require medical attention (report to your care team if they continue or are bothersome): Dry skin Headache Increased tears Nausea Pain, redness, or swelling with sores inside the mouth or throat Sensitivity to light Vomiting This list may not describe all possible side effects. Call your doctor for medical advice about side effects. You may report side effects to FDA at 1-800-FDA-1088. Where should I keep my medication? This medication is given in a hospital or clinic. It will not be stored at home. NOTE: This sheet is a summary. It may not cover all possible information. If you have questions about this medicine, talk to your doctor, pharmacist, or health care provider.  2024 Elsevier/Gold Standard (2021-09-23 00:00:00)    BELOW ARE SYMPTOMS THAT SHOULD BE REPORTED IMMEDIATELY: *FEVER GREATER THAN 100.4 F (38  C) OR HIGHER *CHILLS OR SWEATING *NAUSEA AND VOMITING THAT IS NOT CONTROLLED WITH YOUR NAUSEA MEDICATION *UNUSUAL SHORTNESS OF BREATH *UNUSUAL BRUISING OR BLEEDING *URINARY PROBLEMS (pain or burning when urinating, or frequent urination) *BOWEL PROBLEMS (unusual diarrhea, constipation, pain near the anus) TENDERNESS IN MOUTH AND THROAT WITH OR WITHOUT PRESENCE OF ULCERS (sore throat, sores in mouth, or a toothache) UNUSUAL RASH, SWELLING OR PAIN  UNUSUAL VAGINAL DISCHARGE OR ITCHING   Items with * indicate a potential emergency and should be followed up as soon as possible or go to the Emergency Department if any problems should occur.  Please show the CHEMOTHERAPY ALERT CARD or IMMUNOTHERAPY ALERT CARD at check-in to the Emergency Department and triage nurse.  Should you have questions after your visit or need to cancel or reschedule your appointment, please contact [facility] [phone]  and follow  the prompts.  Office hours are 8:00 a.m. to 4:30 p.m. Monday - Friday. Please note that voicemails left after 4:00 p.m. may not be returned until the following business day.  We are closed weekends and major holidays. You have access to a nurse at all times for urgent questions. Please call the main number to the clinic [phone] and follow the prompts.  For any non-urgent questions, you may also contact your provider using MyChart. We now offer e-Visits for anyone 43 and older to request care online for non-urgent symptoms. For details visit [email or web address].   Also download the MyChart app! Go to the app store, search "MyChart", open the app, select [facility], and log in with your MyChart username and password.

## 2022-11-16 NOTE — Progress Notes (Signed)
Patient has been assessed, vital signs and labs have been reviewed by Dr. Katragadda. ANC, Creatinine, LFTs, and Platelets are within treatment parameters per Dr. Katragadda. The patient is good to proceed with treatment at this time. Primary RN and pharmacy aware.  

## 2022-11-16 NOTE — Patient Instructions (Signed)
[facility]  Discharge Instructions  You were seen and examined today by Dr. Ellin Saba.  Proceed with treatment as scheduled. Your labs are stable.  Follow-up as scheduled.  Thank you for choosing [facility] to provide your oncology and hematology care.   To afford each patient quality time with our provider, please arrive at least 15 minutes before your scheduled appointment time. You may need to reschedule your appointment if you arrive late (10 or more minutes). Arriving late affects you and other patients whose appointments are after yours.  Also, if you miss three or more appointments without notifying the office, you may be dismissed from the clinic at the provider's discretion.    Again, thank you for choosing [facility].  Our hope is that these requests will decrease the amount of time that you wait before being seen by our physicians.   If you have a lab appointment with the Cancer Center - please note that after April 8th, all labs will be drawn in the cancer center.  You do not have to check in or register with the main entrance as you have in the past but will complete your check-in at the cancer center.            _____________________________________________________________  Should you have questions after your visit to [facility], please contact our office at [phone] and follow the prompts.  Our office hours are 8:00 a.m. to 4:30 p.m. Monday - Thursday and 8:00 a.m. to 2:30 p.m. Friday.  Please note that voicemails left after 4:00 p.m. may not be returned until the following business day.  We are closed weekends and all major holidays.  You do have access to a nurse 24-7, just call the main number to the clinic [phone] and do not press any options, hold on the line and a nurse will answer the phone.    For prescription refill requests, have your pharmacy contact our office and allow 72  hours.    Masks are no longer required in the cancer centers. If you would like for your care team to wear a mask while they are taking care of you, please let them know. You may have one support person who is at least 86 years old accompany you for your appointments.

## 2022-11-16 NOTE — Progress Notes (Signed)
Patients port flushed without difficulty.  Good blood return noted with no bruising or swelling noted at site.  Patient remains accessed for chemotherapy treatment.  

## 2022-11-16 NOTE — Progress Notes (Signed)
Patient presents today for Folfox infusion. Patient is in satisfactory condition with no new complaints voiced.  Vital signs are stable.  Labs reviewed by Dr. Ellin Saba during the office visit and all labs are within treatment parameters. Pt will not receive Irinotecan today per Dr.K. We will proceed with treatment per MD orders.   Treatment given today per MD orders. Tolerated infusion without adverse affects. Vital signs stable. No complaints at this time. Discharged from clinic ambulatory in stable condition. Alert and oriented x 3. F/U with [facility] as scheduled. 5FU ambulatory pump infusing.

## 2022-11-17 ENCOUNTER — Other Ambulatory Visit: Payer: Self-pay

## 2022-11-18 ENCOUNTER — Inpatient Hospital Stay: Payer: Medicare HMO

## 2022-11-18 VITALS — BP 135/64 | HR 98 | Temp 98.5°F | Resp 18

## 2022-11-18 DIAGNOSIS — C259 Malignant neoplasm of pancreas, unspecified: Secondary | ICD-10-CM

## 2022-11-18 DIAGNOSIS — Z95828 Presence of other vascular implants and grafts: Secondary | ICD-10-CM

## 2022-11-18 DIAGNOSIS — Z5111 Encounter for antineoplastic chemotherapy: Secondary | ICD-10-CM | POA: Diagnosis not present

## 2022-11-18 LAB — CANCER ANTIGEN 19-9: CA 19-9: 445 U/mL — ABNORMAL HIGH (ref 0–35)

## 2022-11-18 MED ORDER — HEPARIN SOD (PORK) LOCK FLUSH 100 UNIT/ML IV SOLN
500.0000 [IU] | Freq: Once | INTRAVENOUS | Status: AC | PRN
  Administered 2022-11-18: 500 [IU]

## 2022-11-18 MED ORDER — SODIUM CHLORIDE 0.9% FLUSH
10.0000 mL | INTRAVENOUS | Status: DC | PRN
  Administered 2022-11-18: 10 mL

## 2022-11-26 ENCOUNTER — Encounter: Payer: Self-pay | Admitting: Hematology

## 2022-11-26 NOTE — Progress Notes (Signed)
Patient presents today for pump d/c. Vital signs are stable. Needle de-accessed when patient arrived with tegaderm intact . Patient called triage and spoke with RN who instructed her how to turn pump off.  White sediment noted on patient's neck. Skin cleansed with soap and warm water. No redness or swelling noted. Skin intact. Vital signs are stable. Port flushed with 10 mls of Normal Saline and 500 Units of Heparin. Band aid applied. Patient has no complaints at this time. Discharged from clinic ambulatory and in stable condition. Dr. Ellin Saba notified. No new orders.

## 2022-12-07 ENCOUNTER — Ambulatory Visit (HOSPITAL_COMMUNITY)
Admission: RE | Admit: 2022-12-07 | Discharge: 2022-12-07 | Disposition: A | Discharge status: Home / Self Care | Payer: Medicare HMO | Source: Ambulatory Visit | Attending: Hematology | Admitting: Hematology

## 2022-12-07 DIAGNOSIS — C259 Malignant neoplasm of pancreas, unspecified: Secondary | ICD-10-CM

## 2022-12-07 MED ORDER — IOHEXOL 300 MG/ML  SOLN
100.0000 mL | Freq: Once | INTRAMUSCULAR | Status: AC | PRN
  Administered 2022-12-07: 100 mL via INTRAVENOUS

## 2022-12-10 ENCOUNTER — Encounter: Payer: Self-pay | Admitting: Licensed Clinical Social Worker

## 2022-12-10 ENCOUNTER — Inpatient Hospital Stay: Payer: Medicare HMO | Attending: Hematology | Admitting: Licensed Clinical Social Worker

## 2022-12-10 ENCOUNTER — Inpatient Hospital Stay: Payer: Medicare HMO

## 2022-12-10 DIAGNOSIS — Z803 Family history of malignant neoplasm of breast: Secondary | ICD-10-CM | POA: Diagnosis not present

## 2022-12-10 DIAGNOSIS — C259 Malignant neoplasm of pancreas, unspecified: Secondary | ICD-10-CM | POA: Diagnosis not present

## 2022-12-10 DIAGNOSIS — C250 Malignant neoplasm of head of pancreas: Secondary | ICD-10-CM | POA: Insufficient documentation

## 2022-12-10 DIAGNOSIS — Z8041 Family history of malignant neoplasm of ovary: Secondary | ICD-10-CM | POA: Diagnosis not present

## 2022-12-10 DIAGNOSIS — Z800 Family history of malignant neoplasm of digestive organs: Secondary | ICD-10-CM | POA: Insufficient documentation

## 2022-12-10 DIAGNOSIS — D649 Anemia, unspecified: Secondary | ICD-10-CM | POA: Diagnosis not present

## 2022-12-10 DIAGNOSIS — R634 Abnormal weight loss: Secondary | ICD-10-CM | POA: Insufficient documentation

## 2022-12-10 DIAGNOSIS — Z808 Family history of malignant neoplasm of other organs or systems: Secondary | ICD-10-CM | POA: Diagnosis not present

## 2022-12-10 LAB — CBC WITH DIFFERENTIAL/PLATELET
Abs Immature Granulocytes: 1.1 10*3/uL — ABNORMAL HIGH (ref 0.00–0.07)
Band Neutrophils: 2 %
Basophils Absolute: 0 10*3/uL (ref 0.0–0.1)
Basophils Relative: 0 %
Eosinophils Absolute: 0 10*3/uL (ref 0.0–0.5)
Eosinophils Relative: 0 %
HCT: 34.2 % — ABNORMAL LOW (ref 36.0–46.0)
Hemoglobin: 11 g/dL — ABNORMAL LOW (ref 12.0–15.0)
Lymphocytes Relative: 14 %
Lymphs Abs: 2.5 10*3/uL (ref 0.7–4.0)
MCH: 34.5 pg — ABNORMAL HIGH (ref 26.0–34.0)
MCHC: 32.2 g/dL (ref 30.0–36.0)
MCV: 107.2 fL — ABNORMAL HIGH (ref 80.0–100.0)
Metamyelocytes Relative: 2 %
Monocytes Absolute: 1.8 10*3/uL — ABNORMAL HIGH (ref 0.1–1.0)
Monocytes Relative: 10 %
Myelocytes: 4 %
Neutro Abs: 12.4 10*3/uL — ABNORMAL HIGH (ref 1.7–7.7)
Neutrophils Relative %: 68 %
Platelets: 322 10*3/uL (ref 150–400)
RBC: 3.19 MIL/uL — ABNORMAL LOW (ref 3.87–5.11)
RDW: 19 % — ABNORMAL HIGH (ref 11.5–15.5)
WBC: 17.7 10*3/uL — ABNORMAL HIGH (ref 4.0–10.5)
nRBC: 0 % (ref 0.0–0.2)

## 2022-12-10 LAB — COMPREHENSIVE METABOLIC PANEL
ALT: 86 U/L — ABNORMAL HIGH (ref 0–44)
AST: 32 U/L (ref 15–41)
Albumin: 3.1 g/dL — ABNORMAL LOW (ref 3.5–5.0)
Alkaline Phosphatase: 186 U/L — ABNORMAL HIGH (ref 38–126)
Anion gap: 7 (ref 5–15)
BUN: 38 mg/dL — ABNORMAL HIGH (ref 8–23)
CO2: 22 mmol/L (ref 22–32)
Calcium: 9.3 mg/dL (ref 8.9–10.3)
Chloride: 103 mmol/L (ref 98–111)
Creatinine, Ser: 1.14 mg/dL — ABNORMAL HIGH (ref 0.44–1.00)
GFR, Estimated: 47 mL/min — ABNORMAL LOW (ref >60)
Glucose, Bld: 108 mg/dL — ABNORMAL HIGH (ref 70–99)
Potassium: 4.5 mmol/L (ref 3.5–5.1)
Sodium: 132 mmol/L — ABNORMAL LOW (ref 135–145)
Total Bilirubin: 0.6 mg/dL (ref 0.3–1.2)
Total Protein: 6.4 g/dL — ABNORMAL LOW (ref 6.5–8.1)

## 2022-12-10 LAB — MAGNESIUM: Magnesium: 2.4 mg/dL (ref 1.7–2.4)

## 2022-12-10 MED ORDER — HEPARIN SOD (PORK) LOCK FLUSH 100 UNIT/ML IV SOLN
500.0000 [IU] | Freq: Once | INTRAVENOUS | Status: AC
  Administered 2022-12-10: 500 [IU] via INTRAVENOUS

## 2022-12-10 MED ORDER — SODIUM CHLORIDE 0.9% FLUSH
10.0000 mL | Freq: Once | INTRAVENOUS | Status: AC
  Administered 2022-12-10: 10 mL via INTRAVENOUS

## 2022-12-10 NOTE — Progress Notes (Signed)
REFERRING PROVIDER: [name], MD [address] [place],  [place] [address]  PRIMARY PROVIDER:  [name], [name]  PRIMARY REASON FOR VISIT:  1. Pancreatic carcinoma (HCC)   2. Family history of breast cancer   3. Family history of glioblastoma    I connected with Ms. Yankey on 12/10/2022 at 9:50 AM EDT by MyChart video conference and verified that I am speaking with the correct person using two identifiers.    Patient location: [facility] Provider location: [facility]  HISTORY OF PRESENT ILLNESS:   Ms. Benscoter, a 88 y.o. female, was seen for a [facility] cancer genetics consultation at the request of Dr. Ellin Saba due to a personal and family history of cancer.  Ms. Shukla presents to clinic today to discuss the possibility of a hereditary predisposition to cancer, genetic testing, and to further clarify her future cancer risks, as well as potential cancer risks for family members.   In 2023, at the age of 24, Ms. Dicocco was diagnosed with pancreatic cancer. The treatment plan includes chemotherapy.  CANCER HISTORY:  Oncology History  Pancreatic carcinoma (HCC)  11/11/2021 Initial Diagnosis   Pancreatic carcinoma (HCC)   02/05/2022 - 07/02/2022 Chemotherapy   Patient is on Treatment Plan : PANCREATIC Abraxane D1,15 + Gemcitabine D1,15 q28d     08/31/2022 -  Chemotherapy   Patient is on Treatment Plan : PANCREAS Modified FOLFIRINOX q14d x 4 cycles       Past Medical History:  Diagnosis Date   Essential tremor    GERD (gastroesophageal reflux disease)    High cholesterol    Hypertension    PONV (postoperative nausea and vomiting)    Port-A-Cath in place 01/27/2022   Vertigo     Past Surgical History:  Procedure Laterality Date   BILIARY BRUSHING  09/10/2021   Procedure: BILIARY BRUSHING;  Surgeon: Hilarie Fredrickson, MD;  Location: [facility] ENDOSCOPY;  Service: Gastroenterology;;   BILIARY BRUSHING  10/29/2021   Procedure: BILIARY  BRUSHING;  Surgeon: Lemar Lofty., MD;  Location: [facility] ENDOSCOPY;  Service: Gastroenterology;;   BILIARY DILATION  10/29/2021   Procedure: BILIARY DILATION;  Surgeon: Lemar Lofty., MD;  Location: [facility] ENDOSCOPY;  Service: Gastroenterology;;   BILIARY STENT PLACEMENT  09/10/2021   Procedure: BILIARY STENT PLACEMENT;  Surgeon: Hilarie Fredrickson, MD;  Location: [facility] ENDOSCOPY;  Service: Gastroenterology;;   BILIARY STENT PLACEMENT N/A 10/03/2021   Procedure: BILIARY STENT PLACEMENT;  Surgeon: Iva Boop, MD;  Location: [facility] ENDOSCOPY;  Service: Gastroenterology;  Laterality: N/A;   BILIARY STENT PLACEMENT N/A 10/29/2021   Procedure: BILIARY STENT PLACEMENT;  Surgeon: Meridee Score Netty Starring., MD;  Location: [facility] ENDOSCOPY;  Service: Gastroenterology;  Laterality: N/A;   BILIARY STENT PLACEMENT N/A 05/08/2022   Procedure: BILIARY STENT PLACEMENT;  Surgeon: Iva Boop, MD;  Location: [facility] ENDOSCOPY;  Service: Gastroenterology;  Laterality: N/A;   BIOPSY  10/29/2021   Procedure: BIOPSY;  Surgeon: Meridee Score Netty Starring., MD;  Location: [facility] ENDOSCOPY;  Service: Gastroenterology;;   ENDOSCOPIC RETROGRADE CHOLANGIOPANCREATOGRAPHY (ERCP) WITH PROPOFOL N/A 10/29/2021   Procedure: ENDOSCOPIC RETROGRADE CHOLANGIOPANCREATOGRAPHY (ERCP) WITH PROPOFOL;  Surgeon: Lemar Lofty., MD;  Location: [facility] ENDOSCOPY;  Service: Gastroenterology;  Laterality: N/A;   ERCP N/A 09/10/2021   Procedure: ENDOSCOPIC RETROGRADE CHOLANGIOPANCREATOGRAPHY (ERCP);  Surgeon: Hilarie Fredrickson, MD;  Location[facility] END[facility]COPY;  Service: Gastroenterology;  Laterality: N/A;   ERCP N/A 10/03/2021   Procedure: ENDOSCOPIC RETROGRADE CHOLANGIOPANCREATOGRAPHY (ERCP);  Surgeon: Iva Boop, MD;  Location: [facility]  ENDOSCOPY;  Service: Gastroenterology;  Laterality: N/A;   ERCP N/A 05/08/2022   Procedure: ENDOSCOPIC RETROGRADE CHOLANGIOPANCREATOGRAPHY (ERCP);  Surgeon: Iva Boop, MD;  Location: [facility] ENDOSCOPY;  Service: Gastroenterology;   Laterality: N/A;   ESOPHAGOGASTRODUODENOSCOPY (EGD) WITH PROPOFOL N/A 10/29/2021   Procedure: ESOPHAGOGASTRODUODENOSCOPY (EGD) WITH PROPOFOL;  Surgeon: Meridee Score Netty Starring., MD;  Location: [facility] ENDOSCOPY;  Service: Gastroenterology;  Laterality: N/A;   EUS N/A 10/29/2021   Procedure: UPPER ENDOSCOPIC ULTRASOUND (EUS) RADIAL;  Surgeon: Lemar Lofty., MD;  Location: [facility] ENDOSCOPY;  Service: Gastroenterology;  Laterality: N/A;   FINE NEEDLE ASPIRATION  10/29/2021   Procedure: FINE NEEDLE ASPIRATION (FNA) LINEAR;  Surgeon: Lemar Lofty., MD;  Location: [facility] ENDOSCOPY;  Service: Gastroenterology;;   IR IMAGING GUIDED PORT INSERTION  01/28/2022   PACEMAKER IMPLANT     REMOVAL OF STONES  10/29/2021   Procedure: REMOVAL OF SLUDGE;  Surgeon: Lemar Lofty., MD;  Location: [facility] ENDOSCOPY;  Service: Gastroenterology;;   Dennison Mascot  09/10/2021   Procedure: Dennison Mascot;  Surgeon: Hilarie Fredrickson, MD;  Location: [facility] ENDOSCOPY;  Service: Gastroenterology;;   [place]man [place] CHOLANGIOSCOPY N/A 10/29/2021   Procedure: ZOXWRUEA CHOLANGIOSCOPY;  Surgeon: Lemar Lofty., MD;  Location: [facility] ENDOSCOPY;  Service: Gastroenterology;  Laterality: N/A;   STENT REMOVAL  10/03/2021   Procedure: STENT REMOVAL;  Surgeon: Iva Boop, MD;  Location: [facility] ENDOSCOPY;  Service: Gastroenterology;;    FAMILY HISTORY:  We obtained a detailed, 4-generation family history.  Significant diagnoses are listed below: Family History  Problem Relation Age of Onset   Cancer Mother        unknown, possibly female cancer   Breast cancer Maternal Aunt        dx 2s   Throat cancer Paternal Uncle    Brain cancer Daughter        glioblastoma d. 62   Stomach cancer Neg Hx    Esophageal cancer Neg Hx    Colon cancer Neg Hx    Pancreatic cancer Neg Hx    Ms. Sandy has 1 biological daughter and 1 adopted daughter. Her biological daughter was diagnosed with glioblastoma at 21 and died at 87. Patient had 2  sisters, 1 brother, no cancers.  Ms. Taite mother died at 77. She did have cancer, unknown exact type but thinks it was a female cancer. Maternal aunt had breast cancer in her 61s and died in her 27s.  Ms. Rybarczyk father died at 79. Paternal uncle had throat cancer. Paternal great aunt had skin cancer.  Ms. Brogden is unaware of previous family history of genetic testing for hereditary cancer risks. There is no reported Ashkenazi Jewish ancestry. There is no known consanguinity.    GENETIC COUNSELING ASSESSMENT: Ms. Tarazon is a 88 y.o. female with a personal history of pancreatic cancer and family history of cancer which is somewhat suggestive of a hereditary cancer syndrome and predisposition to cancer. We, therefore, discussed and recommended the following at today's visit.   DISCUSSION: We discussed that approximately 10% of pancreatic cancer is hereditary. Most cases of hereditary pancreatic cancer are associated with BRCA1/BRCA2 genes, although there are other genes associated with hereditary cancer as well. Cancers and risks are gene specific. We discussed that testing is beneficial for several reasons including knowing about cancer risks, identifying potential screening, risk-reduction, and treatment options that may be appropriate, and to understand if other family members could be at risk for cancer and allow them to undergo genetic testing.   We reviewed the characteristics, features  and inheritance patterns of hereditary cancer syndromes. We also discussed genetic testing, including the appropriate family members to test, the process of testing, insurance coverage and turn-around-time for results. We discussed the implications of a negative, positive and/or variant of uncertain significant result. We recommended Ms. Johannsen pursue genetic testing for the [facility] Multi-Cancer+RNA gene panel.   Based on Ms. Sisk's personal and family history of cancer, she  meets medical criteria for genetic testing. Despite that she meets criteria, she may still have an out of pocket cost.  PLAN: After considering the risks, benefits, and limitations, Ms. Landfair provided informed consent to pursue genetic testing and the blood sample was sent to [facility] for analysis of the Multi-Cancer+RNA panel. Results should be available within approximately 2-3 weeks' time, at which point they will be disclosed by telephone to Ms. Wohler, as will any additional recommendations warranted by these results. Ms. Kysar will receive a summary of her genetic counseling visit and a copy of her results once available. This information will also be available in Epic.   Ms. Klunder questions were answered to her satisfaction today. Our contact information was provided should additional questions or concerns arise. Thank you for the referral and allowing [place] to share in the care of your patient.   Lacy Duverney, MS, [facility] Genetic Counselor [email or web address] Phone: [phone]  The patient was seen for a total of 25 minutes in virtual genetic counseling. Her granddaughter was present.  Dr. Orlie Dakin was available for discussion regarding this case.   _______________________________________________________________________ For Office Staff:  Number of people involved in session: 2 Was an Intern/ student involved with case: no

## 2022-12-10 NOTE — Progress Notes (Signed)
Patients port flushed without difficulty.  Good blood return noted with no bruising or swelling noted at site.  Band aid applied.  VSS with discharge and left in satisfactory condition with no s/s of distress noted.   

## 2022-12-10 NOTE — Patient Instructions (Signed)
[facility]  Discharge Instructions: Thank you for choosing [facility] to provide your oncology and hematology care.  If you have a lab appointment with the Cancer Center - please note that after April 8th, 2024, all labs will be drawn in the cancer center.  You do not have to check in or register with the main entrance as you have in the past but will complete your check-in in the cancer center.  Wear comfortable clothing and clothing appropriate for easy access to any Portacath or PICC line.   We strive to give you quality time with your provider. You may need to reschedule your appointment if you arrive late (15 or more minutes).  Arriving late affects you and other patients whose appointments are after yours.  Also, if you miss three or more appointments without notifying the office, you may be dismissed from the clinic at the provider's discretion.      For prescription refill requests, have your pharmacy contact our office and allow 72 hours for refills to be completed.  To help prevent nausea and vomiting after your treatment, we encourage you to take your nausea medication as directed.  BELOW ARE SYMPTOMS THAT SHOULD BE REPORTED IMMEDIATELY: *FEVER GREATER THAN 100.4 F (38 C) OR HIGHER *CHILLS OR SWEATING *NAUSEA AND VOMITING THAT IS NOT CONTROLLED WITH YOUR NAUSEA MEDICATION *UNUSUAL SHORTNESS OF BREATH *UNUSUAL BRUISING OR BLEEDING *URINARY PROBLEMS (pain or burning when urinating, or frequent urination) *BOWEL PROBLEMS (unusual diarrhea, constipation, pain near the anus) TENDERNESS IN MOUTH AND THROAT WITH OR WITHOUT PRESENCE OF ULCERS (sore throat, sores in mouth, or a toothache) UNUSUAL RASH, SWELLING OR PAIN  UNUSUAL VAGINAL DISCHARGE OR ITCHING   Items with * indicate a potential emergency and should be followed up as soon as possible or go to the Emergency Department if any problems should occur.  Please show the CHEMOTHERAPY ALERT CARD or  IMMUNOTHERAPY ALERT CARD at check-in to the Emergency Department and triage nurse.  Should you have questions after your visit or need to cancel or reschedule your appointment, please contact [facility] [phone]  and follow the prompts.  Office hours are 8:00 a.m. to 4:30 p.m. Monday - Friday. Please note that voicemails left after 4:00 p.m. may not be returned until the following business day.  We are closed weekends and major holidays. You have access to a nurse at all times for urgent questions. Please call the main number to the clinic [phone] and follow the prompts.  For any non-urgent questions, you may also contact your provider using MyChart. We now offer e-Visits for anyone 18 and older to request care online for non-urgent symptoms. For details visit [email or web address].   Also download the MyChart app! Go to the app store, search "MyChart", open the app, select [facility], and log in with your MyChart username and password.   

## 2022-12-13 NOTE — Progress Notes (Signed)
[facility] [address], [place] [address]    Clinic Day:  12/14/22   Referring physician: [name], [name]  Patient Care Team: [name], [name] as PCP - General (Family Medicine) [name], MD as Medical Oncologist (Medical Oncology) [name], RN as Oncology Nurse Navigator (Medical Oncology)   ASSESSMENT & PLAN:   Assessment: 1. T2 N0 M0 pancreatic adenocarcinoma: - She presented with painless jaundice in April 2023.  Weight loss of 13 pounds since April. - Stent placement on 09/10/2021 for a bilirubin of 8.2. - 10/03/2021: ERCP and stent removal and stent placement for stent dysfunction with total bilirubin 16.3. - 10/29/2021: ERCP/EUS: Lower CBD stent stenosis from stricture, status post metal stent placement.  EUS showed mass in the superior pancreatic head measuring 2.8 x 2.2 cm, suggesting abutment of the portal vein.  Intact interface was seen between lesion and SMA and celiac trunk suggesting lack of invasion.  Hypoechoic 14 x 12 mm lesion visualized in the liver.  Biopsy could not be attempted due to presence of blood vessel. - She had syncopal episodes in 15-Jul-2022, had pacemaker placement for bradycardia. - Pathology: Pancreatic head FNA and biliary stricture brushing: Malignant cells consistent with adenocarcinoma. - CA 19-9: 131 - MRI of the abdomen with and without contrast (12/24/2021): Pancreatic head mass measures 2.7 x 2.3 cm (2.5 x 1.8 cm previously).  Mass is abutting and slightly displacing the SMA.  No obvious fat plane is demonstrated.  Portal and splenic veins are patent.  Stable small scattered hepatic cysts with no liver metastatic disease. - PET scan (11/20/2021): Ill-defined low-level hypermetabolic activity about the metallic CBD stent and pancreatic uncinate process without discrete CT correlate.  No convincing evidence of hypermetabolic disease in the neck, chest, abdomen or pelvis.  Tiny bilateral lung nodules  measuring up to 3 mm.  -CT pancreatic protocol (01/16/2022): Hypoenhancing mass in the pancreatic head measuring 2.9 x 1.8 cm.  Slightly greater than 180 degrees of abutment with portal vein as well as SMA without definite vascular encasement.  New enhancing 9 mm soft tissue nodule along the posterior aspect of the gallbladder wall?  Suspicious for peritoneal implant. - Evaluated by Dr. Freida Busman, hepatobiliary surgeon and felt to be borderline candidate prior to neoadjuvant therapy - 4 cycles of gemcitabine and Abraxane from 02/05/2022 through 06/18/2022  - Guardant360 did not show MSI high.  No other targetable mutations.  Limited tissue NGS testing did not show any targetable mutations.  MS-stable.  TMB-low. - 6 cycles of FOLFOX from 08/31/2022 through 11/16/2022.  She could not tolerate FOLFIRINOX.   2. Social/family history: - She lives at home with her husband who has dementia.  She worked as a Conservation officer, nature for 33 years.  Non-smoker and no exposure to chemicals.  She has not been driving since 07-15-2022. - Daughter died of glioblastoma.  Mother had ovarian cancer.  Maternal aunt had breast cancer.    Plan: T2 N0 M0 pancreatic adenocarcinoma: - She has completed 6 cycles of FOLFOX.  Energy levels are improving. - Reviewed CT CAP from 12/07/2022: 3.3 x 1.8 cm pancreatic head mass, previously 3.2 x 2.4 cm, mildly improved.  No evidence of metastatic disease. - Labs from 12/10/2022: Mildly elevated ALT improving.  Creatinine 1.14.  Last CA 19-9 was 445 on 11/16/2022. - As she is not a surgical candidate, I have recommended chemoradiation therapy with Xeloda. - I have sent a prescription for Xeloda 1000 mg twice daily (adjusted to  renal function) Monday through Friday throughout the course of radiation.  We discussed side effects in detail.  I have sent prescription to our outpatient pharmacy. - I will make a referral to Dr. Sabino Gasser in [place].  I have also discussed the option of switching her care to  medical oncology in [place].  She will let us know. - She will come back 1 week after starting chemoradiation.  2.  Weight loss: - Continue Megace 400 mg twice daily.  She gained about 6 pounds. - Continue Ensure high-protein 1 can daily.   3.  Normocytic anemia: - Last Venofer on 07/31/2022.  Hemoglobin improved to 11 on most recent labs.    No orders of the defined types were placed in this encounter.      Alben Deeds Teague,acting as a Neurosurgeon for [name], MD.,have documented all relevant documentation on the behalf of [name], MD,as directed by  [name], MD while in the presence of [name], MD.  I, [name] MD, have reviewed the above documentation for accuracy and completeness, and I agree with the above.    Rexford Maus   7/14/202410:20 PM  CHIEF COMPLAINT:   Diagnosis: pancreatic adenocarcinoma    Cancer Staging  Pancreatic carcinoma [facility]) Staging form: Exocrine Pancreas, AJCC 8th Edition - Clinical stage from 11/11/2021: Stage IB (cT2, cN0, cM0) - Unsigned    Prior Therapy: Gemcitabine and Abraxane   Current Therapy:  FOLFOX    HISTORY OF PRESENT ILLNESS:   Oncology History  Pancreatic carcinoma (HCC)  11/11/2021 Initial Diagnosis   Pancreatic carcinoma (HCC)   02/05/2022 - 07/02/2022 Chemotherapy   Patient is on Treatment Plan : PANCREATIC Abraxane D1,15 + Gemcitabine D1,15 q28d     08/31/2022 -  Chemotherapy   Patient is on Treatment Plan : PANCREAS Modified FOLFIRINOX q14d x 4 cycles        INTERVAL HISTORY:   Meagan [name] is a 88 y.o. female presenting to clinic today for follow up of pancreatic adenocarcinoma. She was last seen by me on 11/16/22.  She underwent a CT of the chest on 7/8 that found 3.3 cm pancreatic head mass, corresponding to the patient's known pancreatic adenocarcinoma, favored to be mildly improved, tumor involves the SMA, indwelling metallic common duct stent, associated  pneumobilia, and no evidence of metastatic disease in the chest, abdomen, or pelvis. CT of the abdomen and pelvis w and without contrast on 7/8 found the same as above.   Today, she states that she is doing well overall. Her appetite level is at 100%. Her energy level is at 75%. She is accompanied by her daughter.   She has felt well the last 3-4 days. She reports a healthy diet. She notes that she is fatigued after going out of her house, though she has improved energy when at her house. She uses a walker occasionally. She denies abdominal pain, pain that radiates to the back, nausea, vomiting, diarrhea, numbness, or tingling. She is taking 1 ensure and 1 boost a day.  PAST MEDICAL HISTORY:   Past Medical History: Past Medical History:  Diagnosis Date   Essential tremor    GERD (gastroesophageal reflux disease)    High cholesterol    Hypertension    PONV (postoperative nausea and vomiting)    Port-A-Cath in place 01/27/2022   Vertigo     Surgical History: Past Surgical History:  Procedure Laterality Date   BILIARY BRUSHING  09/10/2021   Procedure: BILIARY BRUSHING;  Surgeon: Hilarie Fredrickson,  MD;  Location: [facility] ENDOSCOPY;  Service: Gastroenterology;;   BILIARY BRUSHING  10/29/2021   Procedure: BILIARY BRUSHING;  Surgeon: Lemar Lofty., MD;  Location: [facility];  Service: Gastroenterology;;   BILIARY DILATION  10/29/2021   Procedure: BILIARY DILATION;  Surgeon: Lemar Lofty., MD;  Location: [facility];  Service: Gastroenterology;;   BILIARY STENT PLACEMENT  09/10/2021   Procedure: BILIARY STENT PLACEMENT;  Surgeon: Hilarie Fredrickson, MD;  Location: [facility] ENDOSCOPY;  Service: Gastroenterology;;   BILIARY STENT PLACEMENT N/A 10/03/2021   Procedure: BILIARY STENT PLACEMENT;  Surgeon: Iva Boop, MD;  Location: [facility] ENDOSCOPY;  Service: Gastroenterology;  Laterality: N/A;   BILIARY STENT PLACEMENT N/A 10/29/2021   Procedure: BILIARY STENT PLACEMENT;  Surgeon: Meridee Score  Netty Starring., MD;  Location: [facility] ENDOSCOPY;  Service: Gastroenterology;  Laterality: N/A;   BILIARY STENT PLACEMENT N/A 05/08/2022   Procedure: BILIARY STENT PLACEMENT;  Surgeon: Iva Boop, MD;  Location: [facility] ENDOSCOPY;  Service: Gastroenterology;  Laterality: N/A;   BIOPSY  10/29/2021   Procedure: BIOPSY;  Surgeon: Meridee Score Netty Starring., MD;  Location: [facility] ENDOSCOPY;  Service: Gastroenterology;;   ENDOSCOPIC RETROGRADE CHOLANGIOPANCREATOGRAPHY (ERCP) WITH PROPOFOL N/A 10/29/2021   Procedure: ENDOSCOPIC RETROGRADE CHOLANGIOPANCREATOGRAPHY (ERCP) WITH PROPOFOL;  Surgeon: Lemar Lofty., MD;  Location: [facility] ENDOSCOPY;  Service: Gastroenterology;  Laterality: N/A;   ERCP N/A 09/10/2021   Procedure: ENDOSCOPIC RETROGRADE CHOLANGIOPANCREATOGRAPHY (ERCP);  Surgeon: Hilarie Fredrickson, MD;  Location: [facility] ENDOSCOPY;  Service: Gastroenterology;  Laterality: N/A;   ERCP N/A 10/03/2021   Procedure: ENDOSCOPIC RETROGRADE CHOLANGIOPANCREATOGRAPHY (ERCP);  Surgeon: Iva Boop, MD;  Location: [facility];  Service: Gastroenterology;  Laterality: N/A;   ERCP N/A 05/08/2022   Procedure: ENDOSCOPIC RETROGRADE CHOLANGIOPANCREATOGRAPHY (ERCP);  Surgeon: Iva Boop, MD;  Location: [facility];  Service: Gastroenterology;  Laterality: N/A;   ESOPHAGOGASTRODUODENOSCOPY (EGD) WITH PROPOFOL N/A 10/29/2021   Procedure: ESOPHAGOGASTRODUODENOSCOPY (EGD) WITH PROPOFOL;  Surgeon: Meridee Score Netty Starring., MD;  Location: [facility]OS[facility];  Service: Gastroenterology;  Laterality: N/A;   EUS N/A 10/29/2021   Procedure: UPPER ENDOSCOPIC ULTRASOUND (EUS) RADIAL;  Surgeon: Lemar Lofty., MD;  Location: [facility] ENDOSCOPY;  Service: Gastroenterology;  Laterality: N/A;   FINE NEEDLE ASPIRATION  10/29/2021   Procedure: FINE NEEDLE ASPIRATION (FNA) LINEAR;  Surgeon: Lemar Lofty., MD;  Location: [facility];  Service: Gastroenterology;;   IR IMAGING GUIDED PORT INSERTION  01/28/2022   PACEMAKER IMPLANT     REMOVAL OF  STONES  10/29/2021   Procedure: REMOVAL OF SLUDGE;  Surgeon: Lemar Lofty., MD;  Location: [facility] ENDOSCOPY;  Service: Gastroenterology;;   Dennison Mascot  09/10/2021   Procedure: Dennison Mascot;  Surgeon: Hilarie Fredrickson, MD;  Location: [facility] ENDOSCOPY;  Service: Gastroenterology;;   Burman Freestone CHOLANGIOSCOPY N/A 10/29/2021   Procedure: EXBMWUXL CHOLANGIOSCOPY;  Surgeon: Lemar Lofty., MD;  Location: [facility] ENDOSCOPY;  Service: Gastroenterology;  Laterality: N/A;   STENT REMOVAL  10/03/2021   Procedure: STENT REMOVAL;  Surgeon: Iva Boop, MD;  Location: [facility];  Service: Gastroenterology;;    Social History: Social History   Socioeconomic History   Marital status: Widowed    Spouse name: Not on file   Number of children: Not on file   Years of education: Not on file   Highest education level: Not on file  Occupational History   Not on file  Tobacco Use   Smoking status: Never   Smokeless tobacco: Never  Vaping Use   Vaping status: Never Used  Substance and Sexual Activity   Alcohol  use: Never   Drug use: Never   Sexual activity: Not on file  Other Topics Concern   Not on file  Social History Narrative   Not on file   Social Determinants of Health   Financial Resource Strain: Not on file  Food Insecurity: No Food Insecurity (05/07/2022)   Hunger Vital Sign    Worried About Running Out of Food in the Last Year: Never true    Ran Out of Food in the Last Year: Never true  Transportation Needs: No Transportation Needs (05/07/2022)   PRAPARE - Administrator, Civil Service (Medical): No    Lack of Transportation (Non-Medical): No  Physical Activity: Not on file  Stress: Not on file  Social Connections: Not on file  Intimate Partner Violence: Not At Risk (05/07/2022)   Humiliation, Afraid, Rape, and Kick questionnaire    Fear of Current or Ex-Partner: No    Emotionally Abused: No    Physically Abused: No    Sexually Abused: No    Family  History: Family History  Problem Relation Age of Onset   Cancer Mother        unknown, possibly female cancer   Breast cancer Maternal Aunt        dx 64s   Throat cancer Paternal Uncle    Brain cancer Daughter        glioblastoma d. 18   Stomach cancer Neg Hx    Esophageal cancer Neg Hx    Colon cancer Neg Hx    Pancreatic cancer Neg Hx     Current Medications:  Current Outpatient Medications:    amLODipine (NORVASC) 5 MG tablet, 1 tab(s) orally once a day for 90 days, Disp: , Rfl:    aspirin EC 81 MG tablet, Take 81 mg by mouth daily. Swallow whole., Disp: , Rfl:    Cholecalciferol (VITAMIN D3) 50 MCG (2000 UT) TABS, Take 2,000 Units by mouth daily after supper., Disp: , Rfl:    Coenzyme Q10 (CO Q-10) 100 MG CAPS, Take 100 mg by mouth every evening., Disp: , Rfl:    famotidine (PEPCID) 40 MG tablet, Take 40 mg by mouth every evening., Disp: , Rfl:    gabapentin (NEURONTIN) 100 MG capsule, Take 100 mg by mouth 2 (two) times daily., Disp: , Rfl:    meclizine (ANTIVERT) 25 MG tablet, Take 25 mg by mouth daily as needed for dizziness., Disp: , Rfl:    megestrol (MEGACE) 400 MG/10ML suspension, Take 10 mLs (400 mg total) by mouth 2 (two) times daily., Disp: 480 mL, Rfl: 3   Misc Natural Products (JOINT SUPPORT) CAPS, Take 1 capsule by mouth daily with breakfast., Disp: , Rfl:    Multiple Vitamin (MULTIVITAMIN) tablet, Take 1 tablet by mouth daily with breakfast., Disp: , Rfl:    Multiple Vitamins-Minerals (OCUVITE EYE HEALTH FORMULA) CAPS, Take 1 capsule by mouth daily., Disp: , Rfl:    nebivolol (BYSTOLIC) 5 MG tablet, Take 2.5 mg by mouth in the morning., Disp: , Rfl:    omeprazole (PRILOSEC) 40 MG capsule, Take 40 mg by mouth daily before breakfast., Disp: , Rfl:    polyethylene glycol (MIRALAX) 17 g packet, Take 17 g by mouth daily as needed. (Patient taking differently: Take 17 g by mouth daily as needed for moderate constipation.), Disp: 14 each, Rfl: 0   Polyvinyl Alcohol-Povidone  (REFRESH OP), Place 1 drop into both eyes daily as needed (tired eyes)., Disp: , Rfl:    predniSONE (DELTASONE) 50 MG  tablet, Take 13 hours, 7 hours and 1 hour prior to CT scan, Disp: 3 tablet, Rfl: 0   primidone (MYSOLINE) 50 MG tablet, Take 25 mg by mouth daily as needed (for tremors)., Disp: , Rfl:    pyridOXINE (VITAMIN B-6) 100 MG tablet, Take 50 mg by mouth in the morning., Disp: , Rfl:    tiZANidine (ZANAFLEX) 2 MG tablet, Take 1 mg by mouth daily as needed for muscle spasms., Disp: , Rfl:    Allergies: Allergies  Allergen Reactions   Ivp Dye [Iodinated Contrast Media] Hives   Lisinopril Cough    REVIEW OF SYSTEMS:   Review of Systems  Constitutional:  Negative for chills, fatigue and fever.  HENT:   Negative for lump/mass, mouth sores, nosebleeds, sore throat and trouble swallowing.   Eyes:  Negative for eye problems.  Respiratory:  Negative for cough and shortness of breath.   Cardiovascular:  Negative for chest pain, leg swelling and palpitations.  Gastrointestinal:  Negative for abdominal pain, constipation, diarrhea, nausea and vomiting.  Genitourinary:  Negative for bladder incontinence, difficulty urinating, dysuria, frequency, hematuria and nocturia.   Musculoskeletal:  Negative for arthralgias, back pain, flank pain, myalgias and neck pain.       +bilateral leg weakness  Skin:  Negative for itching and rash.  Neurological:  Negative for dizziness, headaches and numbness.  Hematological:  Does not bruise/bleed easily.  Psychiatric/Behavioral:  Negative for depression, sleep disturbance and suicidal ideas. The patient is not nervous/anxious.   All other systems reviewed and are negative.    VITALS:   There were no vitals taken for this visit.  Wt Readings from Last 3 Encounters:  11/16/22 116 lb (52.6 kg)  11/02/22 118 lb 3.2 oz (53.6 kg)  10/12/22 114 lb 9.6 oz (52 kg)    There is no height or weight on file to calculate BMI.  Performance status (ECOG): 1 -  Symptomatic but completely ambulatory  PHYSICAL EXAM:   Physical Exam Vitals and nursing note reviewed. Exam conducted with a chaperone present.  Constitutional:      Appearance: Normal appearance.  Cardiovascular:     Rate and Rhythm: Normal rate and regular rhythm.     Pulses: Normal pulses.     Heart sounds: Normal heart sounds.  Pulmonary:     Effort: Pulmonary effort is normal.     Breath sounds: Normal breath sounds.  Abdominal:     Palpations: Abdomen is soft. There is no hepatomegaly, splenomegaly or mass.     Tenderness: There is no abdominal tenderness.  Musculoskeletal:     Right lower leg: No edema.     Left lower leg: No edema.  Lymphadenopathy:     Cervical: No cervical adenopathy.     Right cervical: No superficial, deep or posterior cervical adenopathy.    Left cervical: No superficial, deep or posterior cervical adenopathy.     Upper Body:     Right upper body: No supraclavicular or axillary adenopathy.     Left upper body: No supraclavicular or axillary adenopathy.  Neurological:     General: No focal deficit present.     Mental Status: She is alert and oriented to person, place, and time.  Psychiatric:        Mood and Affect: Mood normal.        Behavior: Behavior normal.     LABS:      Latest Ref Rng & Units 12/10/2022    9:49 AM 11/16/2022    9:23 AM  11/02/2022    9:05 AM  CBC  WBC 4.0 - 10.5 K/uL 17.7  5.0  20.2   Hemoglobin 12.0 - 15.0 g/dL 32.4  9.8  40.1   Hematocrit 36.0 - 46.0 % 34.2  29.8  35.0   Platelets 150 - 400 K/uL 322  296  305       Latest Ref Rng & Units 12/10/2022    9:49 AM 11/16/2022    9:23 AM 11/02/2022    9:05 AM  CMP  Glucose 70 - 99 mg/dL 027  253  664   BUN 8 - 23 mg/dL 38  40  39   Creatinine 0.44 - 1.00 mg/dL 4.03  4.74  2.59   Sodium 135 - 145 mmol/L 132  134  133   Potassium 3.5 - 5.1 mmol/L 4.5  4.2  4.8   Chloride 98 - 111 mmol/L 103  108  105   CO2 22 - 32 mmol/L 22  18  20    Calcium 8.9 - 10.3 mg/dL 9.3  9.1   9.3   Total Protein 6.5 - 8.1 g/dL 6.4  6.3  6.3   Total Bilirubin 0.3 - 1.2 mg/dL 0.6  0.6  0.7   Alkaline Phos 38 - 126 U/L 186  238  203   AST 15 - 41 U/L 32  47  30   ALT 0 - 44 U/L 86  125  92      No results found for: "CEA1", "CEA" / No results found for: "CEA1", "CEA" No results found for: "PSA1" Lab Results  Component Value Date   CAN199 445 (H) 11/16/2022   No results found for: "DGL875"  No results found for: "TOTALPROTELP", "ALBUMINELP", "A1GS", "A2GS", "BETS", "BETA2SER", "GAMS", "MSPIKE", "SPEI" Lab Results  Component Value Date   TIBC 438 06/18/2022   FERRITIN 32 06/18/2022   IRONPCTSAT 13 06/18/2022   No results found for: "LDH"   STUDIES:   CT ABD PELVIS W/WO CM ONCOLOGY PANCREATIC PROTOCOL  Result Date: 12/12/2022 CLINICAL DATA:  Follow-up pancreatic adenocarcinoma EXAM: CT CHEST WITH CONTRAST CT ABDOMEN AND PELVIS WITH AND WITHOUT CONTRAST TECHNIQUE: Multidetector CT imaging of the chest was performed during intravenous contrast administration. Multidetector CT imaging of the abdomen and pelvis was performed following the standard protocol before and during bolus administration of intravenous contrast. RADIATION DOSE REDUCTION: This exam was performed according to the departmental dose-optimization program which includes automated exposure control, adjustment of the mA and/or kV according to patient size and/or use of iterative reconstruction technique. CONTRAST:  OMNIPAQUE IOHEXOL 300 MG/ML  SOLN COMPARISON:  07/09/2022 FINDINGS: CT CHEST FINDINGS Cardiovascular: The heart is normal in size. No pericardial effusion. No evidence of thoracic aortic aneurysm. Atherosclerotic calcifications of the aortic arch. Mild coronary atherosclerosis of the right coronary artery. Left subclavian pacemaker. Right chest port terminates at the cavoatrial junction. Mediastinum/Nodes: No suspicious mediastinal lymphadenopathy. Visualized thyroid is grossly unremarkable.  Lungs/Pleura: Mild biapical pleural-parenchymal scarring. Small subpleural nodules in the lungs bilaterally, including a 3 mm right middle lobe nodule (series 12/image 9) and a 4 mm left lower lobe nodule (series 12/image 104), unchanged, benign. No new/suspicious pulmonary nodules. No focal consolidation. No pleural effusion or pneumothorax. Musculoskeletal: Mild degenerative changes of the thoracic spine. CT ABDOMEN AND PELVIS FINDINGS Hepatobiliary: Liver is within normal limits. Gallbladder is decompressed. Indwelling metallic common duct stent. Associated pneumobilia. Pancreas: 3.3 x 1.8 cm pancreatic head mass (series 4/image 64), previously 3.2 x 2.4 cm, favored to be mildly improved.  Associated mild main pancreatic ductal dilatation. Spleen: Within normal limits. Adrenals/Urinary Tract: Adrenal glands are within normal limits. 4.6 cm right lower pole renal cyst (series 9/image within 93), benign (Bosniak I). Additional small bilateral renal cysts, measuring up to 10 mm in the medial left upper kidney (series 9/image 147), benign (Bosniak I). No follow-up is recommended. No hydronephrosis. Stomach/Bowel: Stomach is within normal limits. No evidence of bowel obstruction. Normal appendix (series 9/image 226). No colonic wall thickening or mass is seen. Vascular/Lymphatic: No evidence of abdominal aortic aneurysm. No evidence of abdominal aortic aneurysm. Tumor abuts/involves greater than 180 degrees of the SMA, unchanged. Reproductive: Uterus is within normal limits. Bilateral ovaries are within normal limits. Other: No abdominopelvic ascites. Musculoskeletal: Degenerative changes of the lumbar spine. IMPRESSION: 3.3 cm pancreatic head mass, corresponding to the patient's known pancreatic adenocarcinoma, favored to be mildly improved. Tumor involves the SMA, as above. Indwelling metallic common duct stent. Associated pneumobilia. Otherwise, no evidence of metastatic disease in the chest, abdomen, or pelvis.  Electronically Signed   By: Charline Bills M.D.   On: 12/12/2022 05:20   CT Chest W Contrast  Result Date: 12/12/2022 CLINICAL DATA:  Follow-up pancreatic adenocarcinoma EXAM: CT CHEST WITH CONTRAST CT ABDOMEN AND PELVIS WITH AND WITHOUT CONTRAST TECHNIQUE: Multidetector CT imaging of the chest was performed during intravenous contrast administration. Multidetector CT imaging of the abdomen and pelvis was performed following the standard protocol before and during bolus administration of intravenous contrast. RADIATION DOSE REDUCTION: This exam was performed according to the departmental dose-optimization program which includes automated exposure control, adjustment of the mA and/or kV according to patient size and/or use of iterative reconstruction technique. CONTRAST:  OMNIPAQUE IOHEXOL 300 MG/ML  SOLN COMPARISON:  07/09/2022 FINDINGS: CT CHEST FINDINGS Cardiovascular: The heart is normal in size. No pericardial effusion. No evidence of thoracic aortic aneurysm. Atherosclerotic calcifications of the aortic arch. Mild coronary atherosclerosis of the right coronary artery. Left subclavian pacemaker. Right chest port terminates at the cavoatrial junction. Mediastinum/Nodes: No suspicious mediastinal lymphadenopathy. Visualized thyroid is grossly unremarkable. Lungs/Pleura: Mild biapical pleural-parenchymal scarring. Small subpleural nodules in the lungs bilaterally, including a 3 mm right middle lobe nodule (series 12/image 9) and a 4 mm left lower lobe nodule (series 12/image 104), unchanged, benign. No new/suspicious pulmonary nodules. No focal consolidation. No pleural effusion or pneumothorax. Musculoskeletal: Mild degenerative changes of the thoracic spine. CT ABDOMEN AND PELVIS FINDINGS Hepatobiliary: Liver is within normal limits. Gallbladder is decompressed. Indwelling metallic common duct stent. Associated pneumobilia. Pancreas: 3.3 x 1.8 cm pancreatic head mass (series 4/image 64), previously  3.2 x 2.4 cm, favored to be mildly improved. Associated mild main pancreatic ductal dilatation. Spleen: Within normal limits. Adrenals/Urinary Tract: Adrenal glands are within normal limits. 4.6 cm right lower pole renal cyst (series 9/image within 93), benign (Bosniak I). Additional small bilateral renal cysts, measuring up to 10 mm in the medial left upper kidney (series 9/image 147), benign (Bosniak I). No follow-up is recommended. No hydronephrosis. Stomach/Bowel: Stomach is within normal limits. No evidence of bowel obstruction. Normal appendix (series 9/image 226). No colonic wall thickening or mass is seen. Vascular/Lymphatic: No evidence of abdominal aortic aneurysm. No evidence of abdominal aortic aneurysm. Tumor abuts/involves greater than 180 degrees of the SMA, unchanged. Reproductive: Uterus is within normal limits. Bilateral ovaries are within normal limits. Other: No abdominopelvic ascites. Musculoskeletal: Degenerative changes of the lumbar spine. IMPRESSION: 3.3 cm pancreatic head mass, corresponding to the patient's known pancreatic adenocarcinoma, favored to be mildly improved.  Tumor involves the SMA, as above. Indwelling metallic common duct stent. Associated pneumobilia. Otherwise, no evidence of metastatic disease in the chest, abdomen, or pelvis. Electronically Signed   By: Charline Bills M.D.   On: 12/12/2022 05:20

## 2022-12-14 ENCOUNTER — Other Ambulatory Visit (HOSPITAL_COMMUNITY): Payer: Self-pay

## 2022-12-14 ENCOUNTER — Telehealth: Payer: Self-pay | Admitting: Pharmacist

## 2022-12-14 ENCOUNTER — Telehealth: Payer: Self-pay

## 2022-12-14 ENCOUNTER — Inpatient Hospital Stay: Payer: Medicare HMO | Admitting: Hematology

## 2022-12-14 ENCOUNTER — Other Ambulatory Visit: Payer: Self-pay

## 2022-12-14 ENCOUNTER — Encounter: Payer: Self-pay | Admitting: Hematology

## 2022-12-14 VITALS — BP 159/61 | HR 77 | Temp 99.4°F | Resp 18

## 2022-12-14 DIAGNOSIS — C250 Malignant neoplasm of head of pancreas: Secondary | ICD-10-CM | POA: Diagnosis not present

## 2022-12-14 DIAGNOSIS — C259 Malignant neoplasm of pancreas, unspecified: Secondary | ICD-10-CM

## 2022-12-14 MED ORDER — CAPECITABINE 500 MG PO TABS
1500.0000 mg | ORAL_TABLET | Freq: Two times a day (BID) | ORAL | 0 refills | Status: DC
Start: 2022-12-14 — End: 2022-12-15
  Filled 2022-12-14: qty 180, 30d supply, fill #0

## 2022-12-14 NOTE — Progress Notes (Signed)
Order for Rolator faxed to [facility] in [place] per patient request @ [phone] - P - [phone].

## 2022-12-14 NOTE — Patient Instructions (Signed)
[facility] at [facility] Discharge Instructions   You were seen and examined today by Dr. Ellin Saba.  He reviewed the results of your CT scan. It shows the cancer has improved.   Dr. Kirtland Bouchard discussed with you continuing treatment with radiation therapy. You will need to take a pill called Xeloda along with radiation treatments. Radiation would be 5 days a week (Monday-Friday). The Xeloda pills are only taken on the days of radiation treatments. These pills come from a specialty pharmacy and will be delivered to your home. Expect calls from [facility] to arrange for these pills to be delivered.   We will refer you to a radiation oncologist in [place] for treatment. Do not start the Xeloda pills until the day you start radiation.   Dr. Kirtland Bouchard would like you to see [place] the week after you start radiation and the pills. We will monitor your blood work weekly here in our clinic while you are receiving radiation and taking the pills.   Return as scheduled.    Thank you for choosing [facility] at [facility] to provide your oncology and hematology care.  To afford each patient quality time with our provider, please arrive at least 15 minutes before your scheduled appointment time.   If you have a lab appointment with the Cancer Center please come in thru the Main Entrance and check in at the main information desk.  You need to re-schedule your appointment should you arrive 10 or more minutes late.  We strive to give you quality time with our providers, and arriving late affects you and other patients whose appointments are after yours.  Also, if you no show three or more times for appointments you may be dismissed from the clinic at the providers discretion.     Again, thank you for choosing [facility].  Our hope is that these requests will decrease the amount of time that you wait before being seen by our physicians.        _____________________________________________________________  Should you have questions after your visit to [facility], please contact our office at [phone] and follow the prompts.  Our office hours are 8:00 a.m. and 4:30 p.m. Monday - Friday.  Please note that voicemails left after 4:00 p.m. may not be returned until the following business day.  We are closed weekends and major holidays.  You do have access to a nurse 24-7, just call the main number to the clinic [phone] and do not press any options, hold on the line and a nurse will answer the phone.    For prescription refill requests, have your pharmacy contact our office and allow 72 hours.    Due to Covid, you will need to wear a mask upon entering the hospital. If you do not have a mask, a mask will be given to you at the Main Entrance upon arrival. For doctor visits, patients may have 1 support person age 67 or older with them. For treatment visits, patients can not have anyone with them due to social distancing guidelines and our immunocompromised population.

## 2022-12-14 NOTE — Telephone Encounter (Signed)
Oral Oncology Patient Advocate Encounter  After completing a benefits investigation, prior authorization for Capecitabine is not required at this time through [facility] Part D.  Patient's copay is $1,056.15.  Patient's copay using [facility] [employer] is $120.60.  Obtained grant to bring co-pay to $0.00.   Ardeen Fillers, CPhT Oncology Pharmacy Patient Advocate  [facility]  [phone] (phone) [phone] (fax) 12/14/2022 3:03 PM

## 2022-12-14 NOTE — Telephone Encounter (Signed)
Clinical Pharmacist Practitioner Encounter   Received new prescription for Xeloda (capecitabine) for the treatment of pancreatic adenocarcinoma in conjunction with radiation, planned duration until the end of radiation.  CMP from 12/10/22 assessed, SCr slight elevated CrCl ~3ml/min. Patient's dose of capecitabine should be reduced by 25%. Prescription dose and frequency assessed. ***  Current medication list in Epic reviewed, one DDIs with omeprazole identified: Omeprazole: Proton Pump Inhibitors (PPI) may diminish the therapeutic effect of capecitabine, varying information on the clinical impact. Recommend evaluating the need for a PPI/acid suppression. If acid suppression is needed, attempt switching to a H2 antagonist (eg, famotidine) if possible.  Evaluated chart and no patient barriers to medication adherence identified.   Prescription has been e-scribed to the [facility] for benefits analysis and approval.  Oral Oncology Clinic will continue to follow for insurance authorization, copayment issues, initial counseling and start date.   Remi Haggard, PharmD, BCPS, BCOP, CPP Hematology/Oncology Clinical Pharmacist Practitioner [place] [phone]  12/14/2022 3:15 PM

## 2022-12-15 ENCOUNTER — Other Ambulatory Visit: Payer: Self-pay

## 2022-12-15 ENCOUNTER — Other Ambulatory Visit (HOSPITAL_COMMUNITY): Payer: Self-pay

## 2022-12-15 ENCOUNTER — Encounter: Payer: Self-pay | Admitting: Hematology

## 2022-12-15 ENCOUNTER — Telehealth: Payer: Self-pay

## 2022-12-15 MED ORDER — CAPECITABINE 500 MG PO TABS
1000.0000 mg | ORAL_TABLET | Freq: Two times a day (BID) | ORAL | 0 refills | Status: DC
Start: 2022-12-15 — End: 2023-03-24
  Filled 2022-12-15: qty 80, 28d supply, fill #0
  Filled 2022-12-15 (×2): qty 120, 30d supply, fill #0
  Filled 2023-01-25: qty 40, 10d supply, fill #1

## 2022-12-15 NOTE — Telephone Encounter (Signed)
Clinical Pharmacist Practitioner Encounter   [facility] will deliver medication on 12/17/22. Patient knows not to start until her first day of radiation treatment.  Patient Education I spoke with patient for overview of new oral chemotherapy medication: Xeloda (capecitabine) for the treatment of pancreatic adenocarcinoma in conjunction with radiation, planned duration until the end of radiation.    Counseled patient on administration, dosing, side effects, monitoring, drug-food interactions, safe handling, storage, and disposal. Patient will take 2 tablets (1,000 mg total) by mouth 2 (two) times daily after a meal. Take Monday- Friday. Take only on days of radiation.  Side effects include but not limited to: diarrhea, hand-foot syndrome, mouth sores, edema, decreased wbc, fatigue, N/V Diarrhea: patient has loperamide on hand. She know to use as needed and call the office if she is having 4 or more loose stools per day Hand-foot syndrome: Recommended use of [place]der[place] Smooth Extra Care 20. She knows to report any skin changes on her hands or feet Mouth sores: Patient knows to request magic mouthwash if needed    Reviewed with patient importance of keeping a medication schedule and plan for any missed doses.  After discussion with patient no patient barriers to medication adherence identified.   Meagan [name] voiced understanding and appreciation. All questions answered. Medication handout provided.  Provided patient with Oral Chemotherapy Navigation Clinic phone number. Patient knows to call the office with questions or concerns. Oral Chemotherapy Navigation Clinic will continue to follow.  Meagan [name], PharmD, BCPS, BCOP, CPP Hematology/Oncology Clinical Pharmacist Practitioner [place] [phone]  12/15/2022 10:56 AM

## 2022-12-15 NOTE — Telephone Encounter (Signed)
Patient successfully OnBoarded and drug education provided by pharmacist. Medication scheduled to be shipped on 12/16/22 for delivery on 12/17/22 from [facility] to patient's address. Patient also knows to call me at [phone] with any questions or concerns regarding receiving medication.    Ardeen Fillers, CPhT Oncology Pharmacy Patient Advocate  [facility]  [phone] (phone) [phone] (fax) 12/15/2022 11:00 AM

## 2022-12-15 NOTE — Telephone Encounter (Signed)
Oral Oncology Patient Advocate Encounter   Was successful in securing patient a $4,500.00 grant from [employer] to provide copayment coverage for Capecitabine.  This will keep the out of pocket expense at $0.     I have spoken with the patient.    The billing information is as follows and has been shared with [facility].   Member ID: [id number] Group ID: [id number] RxBin: [id number] PCN: [id number] Dates of Eligibility: 12/15/22 through 12/15/23  Fund name:  Pancreatic Cancer.   Ardeen Fillers, CPhT Oncology Pharmacy Patient Advocate  [facility]  [phone] (phone) [phone] (fax) 12/15/2022 10:50 AM

## 2022-12-16 ENCOUNTER — Other Ambulatory Visit: Payer: Self-pay

## 2022-12-16 ENCOUNTER — Other Ambulatory Visit (HOSPITAL_COMMUNITY): Payer: Self-pay

## 2022-12-22 ENCOUNTER — Telehealth: Payer: Self-pay | Admitting: Licensed Clinical Social Worker

## 2022-12-22 ENCOUNTER — Ambulatory Visit: Payer: Self-pay | Admitting: Licensed Clinical Social Worker

## 2022-12-22 ENCOUNTER — Encounter: Payer: Self-pay | Admitting: Licensed Clinical Social Worker

## 2022-12-22 DIAGNOSIS — Z1379 Encounter for other screening for genetic and chromosomal anomalies: Secondary | ICD-10-CM

## 2022-12-22 NOTE — Progress Notes (Signed)
HPI:   Meagan [name] was previously seen in the [facility] due to a personal and family history of cancer and concerns regarding a hereditary predisposition to cancer. Please refer to our prior cancer genetics clinic note for more information regarding our discussion, assessment and recommendations, at the time. Meagan [name] recent genetic test results were disclosed to her, as were recommendations warranted by these results. These results and recommendations are discussed in more detail below.  CANCER HISTORY:  Oncology History  Pancreatic carcinoma (HCC)  11/11/2021 Initial Diagnosis   Pancreatic carcinoma (HCC)   02/05/2022 - 07/02/2022 Chemotherapy   Patient is on Treatment Plan : PANCREATIC Abraxane D1,15 + Gemcitabine D1,15 q28d     08/31/2022 -  Chemotherapy   Patient is on Treatment Plan : PANCREAS Modified FOLFIRINOX q14d x 4 cycles     12/22/2022 Genetic Testing   No pathogenic variants identified on the [facility] Multi-Cancer+RNA panel. VUS in BMPR1A called Gain (Promoter, Non-coding exon 1, Non-coding exon 2, Exons 3-13) identified. The report date is 12/22/2022.  The Multi-Cancer + RNA Panel offered by [facility] includes sequencing and/or deletion/duplication analysis of the following 70 genes:  AIP*, ALK, APC*, ATM*, AXIN2*, BAP1*, BARD1*, BLM*, BMPR1A*, BRCA1*, BRCA2*, BRIP1*, CDC73*, CDH1*, CDK4, CDKN1B*, CDKN2A, CHEK2*, CTNNA1*, DICER1*, EPCAM, EGFR, FH*, FLCN*, GREM1, HOXB13, KIT, LZTR1, MAX*, MBD4, MEN1*, MET, MITF, MLH1*, MSH2*, MSH3*, MSH6*, MUTYH*, NF1*, NF2*, NTHL1*, PALB2*, PDGFRA, PMS2*, POLD1*, POLE*, POT1*, PRKAR1A*, PTCH1*, PTEN*, RAD51C*, RAD51D*, RB1*, RET, SDHA*, SDHAF2*, SDHB*, SDHC*, SDHD*, SMAD4*, SMARCA4*, SMARCB1*, SMARCE1*, STK11*, SUFU*, TMEM127*, TP53*, TSC1*, TSC2*, VHL*. RNA analysis is performed for * genes.     FAMILY HISTORY:  We obtained a detailed, 4-generation family history.  Significant diagnoses are listed below: Family  History  Problem Relation Age of Onset   Cancer Mother        unknown, possibly female cancer   Breast cancer Maternal Aunt        dx 77s   Throat cancer Paternal Uncle    Brain cancer Daughter        glioblastoma d. 35   Stomach cancer Neg Hx    Esophageal cancer Neg Hx    Colon cancer Neg Hx    Pancreatic cancer Neg Hx    Meagan [name] has 1 biological daughter and 1 adopted daughter. Her biological daughter was diagnosed with glioblastoma at 88 and died at 53. Patient had 2 sisters, 1 brother, no cancers.   Meagan [name] mother died at 17. She did have cancer, unknown exact type but thinks it was a female cancer. Maternal aunt had breast cancer in her 22s and died in her 27s.   Meagan [name] father died at 11. Paternal uncle had throat cancer. Paternal great aunt had skin cancer.   Meagan [name] is unaware of previous family history of genetic testing for hereditary cancer risks. There is no reported Ashkenazi Jewish ancestry. There is no known consanguinity.      GENETIC TEST RESULTS:  The [facility] Multi-Cancer+RNA Panel found no pathogenic mutations.   The Multi-Cancer + RNA Panel offered by [facility] includes sequencing and/or deletion/duplication analysis of the following 70 genes:  AIP*, ALK, APC*, ATM*, AXIN2*, BAP1*, BARD1*, BLM*, BMPR1A*, BRCA1*, BRCA2*, BRIP1*, CDC73*, CDH1*, CDK4, CDKN1B*, CDKN2A, CHEK2*, CTNNA1*, DICER1*, EPCAM, EGFR, FH*, FLCN*, GREM1, HOXB13, KIT, LZTR1, MAX*, MBD4, MEN1*, MET, MITF, MLH1*, MSH2*, MSH3*, MSH6*, MUTYH*, NF1*, NF2*, NTHL1*, PALB2*, PDGFRA, PMS2*, POLD1*, POLE*, POT1*, PRKAR1A*, PTCH1*, PTEN*, RAD51C*, RAD51D*, RB1*, RET, SDHA*, SDHAF2*, SDHB*, SDHC*, SDHD*,  SMAD4*, SMARCA4*, SMARCB1*, SMARCE1*, STK11*, SUFU*, TMEM127*, TP53*, TSC1*, TSC2*, VHL*. RNA analysis is performed for * genes.   The test report has been scanned into EPIC and is located under the Molecular Pathology section of the Results Review tab.  A portion of the result  report is included below for reference. Genetic testing reported out on 12/22/22.      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 At this time, it is unknown if this variant is associated with an increased risk for cancer or if it is benign, but most uncertain variants are reclassified to benign. It should not be used to make medical management decisions. With time, we suspect the laboratory will determine the significance of this variant, if any. If the laboratory reclassifies this variant, we will attempt to contact Meagan [name] to discuss it further.   Even though a pathogenic variant was not identified, possible explanations for the cancer in the family may include: There may be no hereditary risk for cancer in the family. The cancers in Meagan [name] and/or her family may be sporadic/familial or due to other genetic and environmental factors. There may be a gene mutation in one of these genes that current testing methods cannot detect but that chance is small. There could be another gene that has not yet been discovered, or that we have not yet tested, that is responsible for the cancer diagnoses in the family.  It is also possible there is a hereditary cause for the cancer in the family that Meagan [name] did not inherit.  Therefore, it is important to remain in touch with cancer genetics in the future so that we can continue to offer Meagan [name] the most up to date genetic testing.   ADDITIONAL GENETIC TESTING:  We discussed with Meagan [name] that her genetic testing was fairly extensive.  If there are additional relevant genes identified to increase cancer risk that can be analyzed in the future, we would be happy to discuss and coordinate this testing at that time.    CANCER SCREENING RECOMMENDATIONS:  Meagan [name]'s test result is considered negative (normal).  This means that we have not identified a hereditary cause for her  personal and family history of cancer at this time.  An individual's cancer risk and medical management are not determined by genetic test results alone. Overall cancer risk assessment incorporates additional factors, including personal medical history, family history, and any available genetic information that may result in a personalized plan for cancer prevention and surveillance. Therefore, it is recommended she continue to follow the cancer management and screening guidelines provided by her oncology and primary healthcare provider.  RECOMMENDATIONS FOR FAMILY MEMBERS:   Since she did not inherit a identifiable mutation in a cancer predisposition gene included on this panel, her children could not have inherited a known mutation from her in one of these genes. Individuals in this family might be at some increased risk of developing cancer, over the general population risk, due to the family history of cancer.  Individuals in the family should notify their providers of the family history of cancer. We recommend women in this family have a yearly mammogram beginning at age 40, or 38 years younger than the earliest onset of cancer, an annual clinical breast exam, and perform monthly breast self-exams.  Family members should have colonoscopies by at age 69, or earlier, as recommended by their providers. We do not recommend familial testing for the BMPR1A variant  of uncertain significance (VUS).  FOLLOW-UP:  Lastly, we discussed with Meagan [name] that cancer genetics is a rapidly advancing field and it is possible that new genetic tests will be appropriate for her and/or her family members in the future. We encouraged her to remain in contact with cancer genetics on an annual basis so we can update her personal and family histories and let her know of advances in cancer genetics that may benefit this family.   Our contact number was provided. Ms. Ucci questions were answered to her  satisfaction, and she knows she is welcome to call us at anytime with additional questions or concerns.    Lacy Duverney, MS, [facility] Genetic Counselor [email or web address] Phone: [phone]

## 2022-12-22 NOTE — Telephone Encounter (Signed)
I contacted Ms. Palecek to discuss her genetic testing results. No pathogenic variants were identified in the 70 genes analyzed. Detailed clinic note to follow.   The test report has been scanned into EPIC and is located under the Molecular Pathology section of the Results Review tab.  A portion of the result report is included below for reference.      Lacy Duverney, MS, [facility] Genetic Counselor [email or web address] Phone: [phone]

## 2023-01-07 ENCOUNTER — Other Ambulatory Visit: Payer: Self-pay

## 2023-01-07 ENCOUNTER — Other Ambulatory Visit (HOSPITAL_COMMUNITY): Payer: Self-pay

## 2023-01-11 ENCOUNTER — Other Ambulatory Visit: Payer: Self-pay

## 2023-01-17 ENCOUNTER — Other Ambulatory Visit: Payer: Self-pay

## 2023-01-20 NOTE — Progress Notes (Signed)
[facility] [address] [place] [address]    Clinic Day:  01/21/23    Referring physician: [name], [name]  Patient Care Team: Meagan Renshaw, [name] as PCP - General (Family Medicine) Meagan Massed, MD as Medical Oncologist (Medical Oncology) Meagan Sarah, RN as Oncology Nurse Navigator (Medical Oncology)   ASSESSMENT & PLAN:   Assessment: 1. T2 N0 M0 pancreatic adenocarcinoma: - She presented with painless jaundice in April 2023.  Weight loss of 13 pounds since April. - Stent placement on 09/10/2021 for a bilirubin of 8.2. - 10/03/2021: ERCP and stent removal and stent placement for stent dysfunction with total bilirubin 16.3. - 10/29/2021: ERCP/EUS: Lower CBD stent stenosis from stricture, status post metal stent placement.  EUS showed mass in the superior pancreatic head measuring 2.8 x 2.2 cm, suggesting abutment of the portal vein.  Intact interface was seen between lesion and SMA and celiac trunk suggesting lack of invasion.  Hypoechoic 14 x 12 mm lesion visualized in the liver.  Biopsy could not be attempted due to presence of blood vessel. - She had syncopal episodes in 20-Jul-2022, had pacemaker placement for bradycardia. - Pathology: Pancreatic head FNA and biliary stricture brushing: Malignant cells consistent with adenocarcinoma. - CA 19-9: 131 - MRI of the abdomen with and without contrast (12/24/2021): Pancreatic head mass measures 2.7 x 2.3 cm (2.5 x 1.8 cm previously).  Mass is abutting and slightly displacing the SMA.  No obvious fat plane is demonstrated.  Portal and splenic veins are patent.  Stable small scattered hepatic cysts with no liver metastatic disease. - PET scan (11/20/2021): Ill-defined low-level hypermetabolic activity about the metallic CBD stent and pancreatic uncinate process without discrete CT correlate.  No convincing evidence of hypermetabolic disease in the neck, chest, abdomen or pelvis.  Tiny bilateral lung nodules  measuring up to 3 mm.  -CT pancreatic protocol (01/16/2022): Hypoenhancing mass in the pancreatic head measuring 2.9 x 1.8 cm.  Slightly greater than 180 degrees of abutment with portal vein as well as SMA without definite vascular encasement.  New enhancing 9 mm soft tissue nodule along the posterior aspect of the gallbladder wall?  Suspicious for peritoneal implant. - Evaluated by Dr. Freida Busman, hepatobiliary surgeon and felt to be borderline candidate prior to neoadjuvant therapy - 4 cycles of gemcitabine and Abraxane from 02/05/2022 through 06/18/2022  - Guardant360 did not show MSI high.  No other targetable mutations.  Limited tissue NGS testing did not show any targetable mutations.  MS-stable.  TMB-low. - 6 cycles of FOLFOX from 08/31/2022 through 11/16/2022.  She could not tolerate FOLFIRINOX. - XRT with Xeloda started around 01/04/2023 - Germline mutation testing: Negative.   2. Social/family history: - She lives at home with her husband who has dementia.  She worked as a Conservation officer, nature for 33 years.  Non-smoker and no exposure to chemicals.  She has not been driving since 2022-07-20. - Daughter died of glioblastoma.  Mother had ovarian cancer.  Maternal aunt had breast cancer.    Plan: T2 N0 M0 pancreatic adenocarcinoma: - She reports that XRT was started on 01/04/2023. - She started taking Xeloda 2 tablets twice daily Monday through Friday. - She denies any mucositis.  No hand-foot skin reaction noted. - Reviewed labs today: Creatinine 1.19.  LFTs are normal with elevated alk phos 245.  Bilirubin normal.  CBC shows normal white count and platelet count. - Continue Xeloda 2 tablets twice daily.  RTC 2 weeks for follow-up. - She  complained of slight dysuria and decreased urination. - We have checked a UA today: Large leukocytes and rare bacteria was seen.  Urine was cloudy. - I will go ahead and treat with Cipro 500 twice daily for 5 days.  2.  Weight loss: - Continue Megace 400 mg twice daily.   Continue Ensure/boost 1 can per day. - She gained about 9 pounds.  She is eating well.   3.  Normocytic anemia: - Last Venofer on 07/31/2022.  Hemoglobin today is 10.0.    Orders Placed This Encounter  Procedures   Urine culture    Standing Status:   Future    Number of Occurrences:   1    Standing Expiration Date:   01/21/2024   CBC with Differential    Standing Status:   Future    Standing Expiration Date:   01/21/2024   Comprehensive metabolic panel    Standing Status:   Future    Standing Expiration Date:   01/21/2024   Magnesium    Standing Status:   Future    Standing Expiration Date:   01/21/2024   TSH    Standing Status:   Future    Standing Expiration Date:   01/21/2024    Order Specific Question:   Release to patient    Answer:   Immediate [1]    Order Specific Question:   Remote health to draw?    Answer:   No   Urinalysis, Routine w reflex microscopic    Standing Status:   Future    Number of Occurrences:   1    Standing Expiration Date:   01/21/2024       Meagan [name],acting as a scribe for Meagan Massed, MD.,have documented all relevant documentation on the behalf of Meagan Massed, MD,as directed by  Meagan Massed, MD while in the presence of Meagan Massed, MD.  I, Meagan Massed MD, have reviewed the above documentation for accuracy and completeness, and I agree with the above.     Meagan Massed, MD   8/22/20246:37 PM  CHIEF COMPLAINT:   Diagnosis: pancreatic adenocarcinoma    Cancer Staging  Pancreatic carcinoma [facility]) Staging form: Exocrine Pancreas, AJCC 8th Edition - Clinical stage from 11/11/2021: Stage IB (cT2, cN0, cM0) - Unsigned    Prior Therapy: Gemcitabine and Abraxane   Current Therapy:  FOLFOX    HISTORY OF PRESENT ILLNESS:   Oncology History  Pancreatic carcinoma (HCC)  11/11/2021 Initial Diagnosis   Pancreatic carcinoma (HCC)   02/05/2022 - 07/02/2022 Chemotherapy   Patient is on Treatment  Plan : PANCREATIC Abraxane D1,15 + Gemcitabine D1,15 q28d     08/31/2022 -  Chemotherapy   Patient is on Treatment Plan : PANCREAS Modified FOLFIRINOX q14d x 4 cycles     12/22/2022 Genetic Testing   No pathogenic variants identified on the [facility] Multi-Cancer+RNA panel. VUS in BMPR1A called Gain (Promoter, Non-coding exon 1, Non-coding exon 2, Exons 3-13) identified. The report date is 12/22/2022.  The Multi-Cancer + RNA Panel offered by [facility] includes sequencing and/or deletion/duplication analysis of the following 70 genes:  AIP*, ALK, APC*, ATM*, AXIN2*, BAP1*, BARD1*, BLM*, BMPR1A*, BRCA1*, BRCA2*, BRIP1*, CDC73*, CDH1*, CDK4, CDKN1B*, CDKN2A, CHEK2*, CTNNA1*, DICER1*, EPCAM, EGFR, FH*, FLCN*, GREM1, HOXB13, KIT, LZTR1, MAX*, MBD4, MEN1*, MET, MITF, MLH1*, MSH2*, MSH3*, MSH6*, MUTYH*, NF1*, NF2*, NTHL1*, PALB2*, PDGFRA, PMS2*, POLD1*, POLE*, POT1*, PRKAR1A*, PTCH1*, PTEN*, RAD51C*, RAD51D*, RB1*, RET, SDHA*, SDHAF2*, SDHB*, SDHC*, SDHD*, SMAD4*, SMARCA4*, SMARCB1*, SMARCE1*, STK11*, SUFU*, TMEM127*, TP53*, TSC1*, TSC2*, VHL*.  RNA analysis is performed for * genes.      INTERVAL HISTORY:   Meagan [name] is a 88 y.o. female presenting to clinic today for follow up of pancreatic adenocarcinoma. She was last seen by me on 12/14/22.  Today, she states that she is doing well overall. Her appetite level is at 100%. Her energy level is at 0%.   She reports a normal appetite and has gained 9 pounds since her last visit. She is taking 2 pills of Xeloda BID. She c/o decreased energy levels and weakness since 01/16/23. She denies any difficulty swallowing, mouth soreness, diarrhea, or soreness on the soles of the feet. She is taking Megace and Boost when she feels as if she has not eaten enough.  She started XRT around 01/04/23 and  will finish radiation within the first week or two of September 2024.   She notes she has an on and off bladder infection for the last 2 months with dysuria. She also notes decreased  urine output.   Germline testing results were negative. Her son-in-law's wife took her to this visit today, and has been taking care of her at home with her son-in-law.   PAST MEDICAL HISTORY:   Past Medical History: Past Medical History:  Diagnosis Date   Essential tremor    GERD (gastroesophageal reflux disease)    High cholesterol    Hypertension    PONV (postoperative nausea and vomiting)    Port-A-Cath in place 01/27/2022   Vertigo     Surgical History: Past Surgical History:  Procedure Laterality Date   BILIARY BRUSHING  09/10/2021   Procedure: BILIARY BRUSHING;  Surgeon: Hilarie Fredrickson, MD;  Location: [facility] ENDOSCOPY;  Service: Gastroenterology;;   BILIARY BRUSHING  10/29/2021   Procedure: BILIARY BRUSHING;  Surgeon: Lemar Lofty., MD;  Location: [facility] ENDOSCOPY;  Service: Gastroenterology;;   BILIARY DILATION  10/29/2021   Procedure: BILIARY DILATION;  Surgeon: Lemar Lofty., MD;  Location: [facility] ENDOSCOPY;  Service: Gastroenterology;;   BILIARY STENT PLACEMENT  09/10/2021   Procedure: BILIARY STENT PLACEMENT;  Surgeon: Hilarie Fredrickson, MD;  Location: [facility] ENDOSCOPY;  Service: Gastroenterology;;   BILIARY STENT PLACEMENT N/A 10/03/2021   Procedure: BILIARY STENT PLACEMENT;  Surgeon: Iva Boop, MD;  Location: [facility] ENDOSCOPY;  Service: Gastroenterology;  Laterality: N/A;   BILIARY STENT PLACEMENT N/A 10/29/2021   Procedure: BILIARY STENT PLACEMENT;  Surgeon: Meridee Score Netty Starring., MD;  Location: [facility] ENDOSCOPY;  Service: Gastroenterology;  Laterality: N/A;   BILIARY STENT PLACEMENT N/A 05/08/2022   Procedure: BILIARY STENT PLACEMENT;  Surgeon: Iva Boop, MD;  Location: [facility] ENDOSCOPY;  Service: Gastroenterology;  Laterality: N/A;   BIOPSY  10/29/2021   Procedure: BIOPSY;  Surgeon: Meridee Score Netty Starring., MD;  Location: [facility] ENDOSCOPY;  Service: Gastroenterology;;   ENDOSCOPIC RETROGRADE CHOLANGIOPANCREATOGRAPHY (ERCP) WITH PROPOFOL N/A 10/29/2021   Procedure:  ENDOSCOPIC RETROGRADE CHOLANGIOPANCREATOGRAPHY (ERCP) WITH PROPOFOL;  Surgeon: Lemar Lofty., MD;  Location: [facility] ENDOSCOPY;  Service: Gastroenterology;  Laterality: N/A;   ERCP N/A 09/10/2021   Procedure: ENDOSCOPIC RETROGRADE CHOLANGIOPANCREATOGRAPHY (ERCP);  Surgeon: Hilarie Fredrickson, MD;  Location: [facility] ENDOSCOPY;  Service: Gastroenterology;  Laterality: N/A;   ERCP N/A 10/03/2021   Procedure: ENDOSCOPIC RETROGRADE CHOLANGIOPANCREATOGRAPHY (ERCP);  Surgeon: Iva Boop, MD;  Location: [facility] ENDOSCOPY;  Service: Gastroenterology;  Laterality: N/A;   ERCP N/A 05/08/2022   Procedure: ENDOSCOPIC RETROGRADE CHOLANGIOPANCREATOGRAPHY (ERCP);  Surgeon: Iva Boop, MD;  Location: [facility] ENDOSCOPY;  Service: Gastroenterology;  Laterality: N/A;   ESOPHAGOGASTRODUODENOSCOPY (EGD) WITH  PROPOFOL N/A 10/29/2021   Procedure: ESOPHAGOGASTRODUODENOSCOPY (EGD) WITH PROPOFOL;  Surgeon: Meridee Score Netty Starring., MD;  Location: [facility] ENDOSCOPY;  Service: Gastroenterology;  Laterality: N/A;   EUS N/A 10/29/2021   Procedure: UPPER ENDOSCOPIC ULTRASOUND (EUS) RADIAL;  Surgeon: Lemar Lofty., MD;  Location: [facility] ENDOSCOPY;  Service: Gastroenterology;  Laterality: N/A;   FINE NEEDLE ASPIRATION  10/29/2021   Procedure: FINE NEEDLE ASPIRATION (FNA) LINEAR;  Surgeon: Lemar Lofty., MD;  Location: [facility] ENDOSCOPY;  Service: Gastroenterology;;   IR IMAGING GUIDED PORT INSERTION  01/28/2022   PACEMAKER IMPLANT     REMOVAL OF STONES  10/29/2021   Procedure: REMOVAL OF SLUDGE;  Surgeon: Lemar Lofty., MD;  Location: [facility] ENDOSCOPY;  Service: Gastroenterology;;   Dennison Mascot  09/10/2021   Procedure: Dennison Mascot;  Surgeon: Hilarie Fredrickson, MD;  Location: [facility] ENDOSCOPY;  Service: Gastroenterology;;   [facility] [place] CHOLANGIOSCOPY N/A 10/29/2021   Procedure: EXBMWUXL CHOLANGIOSCOPY;  Surgeon: Lemar Lofty., MD;  Location: [facility] ENDOSCOPY;  Service: Gastroenterology;  Laterality: N/A;   STENT REMOVAL  10/03/2021    Procedure: STENT REMOVAL;  Surgeon: Iva Boop, MD;  Location: [facility] ENDOSCOPY;  Service: Gastroenterology;;    Social History: Social History   Socioeconomic History   Marital status: Widowed    Spouse name: Not on file   Number of children: Not on file   Years of education: Not on file   Highest education level: Not on file  Occupational History   Not on file  Tobacco Use   Smoking status: Never   Smokeless tobacco: Never  Vaping Use   Vaping status: Never Used  Substance and Sexual Activity   Alcohol use: Never   Drug use: Never   Sexual activity: Not on file  Other Topics Concern   Not on file  Social History Narrative   Not on file   Social Determinants of Health   Financial Resource Strain: Not on file  Food Insecurity: No Food Insecurity (05/07/2022)   Hunger Vital Sign    Worried About Running Out of Food in the Last Year: Never true    Ran Out of Food in the Last Year: Never true  Transportation Needs: No Transportation Needs (05/07/2022)   PRAPARE - Administrator, Civil Service (Medical): No    Lack of Transportation (Non-Medical): No  Physical Activity: Not on file  Stress: Not on file  Social Connections: Not on file  Intimate Partner Violence: Not At Risk (05/07/2022)   Humiliation, Afraid, Rape, and Kick questionnaire    Fear of Current or Ex-Partner: No    Emotionally Abused: No    Physically Abused: No    Sexually Abused: No    Family History: Family History  Problem Relation Age of Onset   Cancer Mother        unknown, possibly female cancer   Breast cancer Maternal Aunt        dx 76s   Throat cancer Paternal Uncle    Brain cancer Daughter        glioblastoma d. 9   Stomach cancer Neg Hx    Esophageal cancer Neg Hx    Colon cancer Neg Hx    Pancreatic cancer Neg Hx     Current Medications:  Current Outpatient Medications:    amLODipine (NORVASC) 5 MG tablet, 1 tab(s) orally once a day for 90 days, Disp: , Rfl:     aspirin EC 81 MG tablet, Take 81 mg by mouth daily. Swallow whole., Disp: , Rfl:  capecitabine (XELODA) 500 MG tablet, Take 2 tablets (1,000 mg total) by mouth 2 (two) times daily after a meal. Take Monday- Friday. Take only on days of radiation., Disp: 120 tablet, Rfl: 0   Cholecalciferol (VITAMIN D3) 50 MCG (2000 UT) TABS, Take 2,000 Units by mouth daily after supper., Disp: , Rfl:    Coenzyme Q10 (CO Q-10) 100 MG CAPS, Take 100 mg by mouth every evening., Disp: , Rfl:    famotidine (PEPCID) 40 MG tablet, Take 40 mg by mouth every evening., Disp: , Rfl:    gabapentin (NEURONTIN) 100 MG capsule, Take 100 mg by mouth 2 (two) times daily., Disp: , Rfl:    meclizine (ANTIVERT) 25 MG tablet, Take 25 mg by mouth daily as needed for dizziness., Disp: , Rfl:    megestrol (MEGACE) 400 MG/10ML suspension, Take 10 mLs (400 mg total) by mouth 2 (two) times daily., Disp: 480 mL, Rfl: 3   Misc Natural Products (JOINT SUPPORT) CAPS, Take 1 capsule by mouth daily with breakfast., Disp: , Rfl:    Multiple Vitamin (MULTIVITAMIN) tablet, Take 1 tablet by mouth daily with breakfast., Disp: , Rfl:    Multiple Vitamins-Minerals (OCUVITE EYE HEALTH FORMULA) CAPS, Take 1 capsule by mouth daily., Disp: , Rfl:    nebivolol (BYSTOLIC) 5 MG tablet, Take 2.5 mg by mouth in the morning., Disp: , Rfl:    omeprazole (PRILOSEC) 40 MG capsule, Take 40 mg by mouth daily before breakfast., Disp: , Rfl:    polyethylene glycol (MIRALAX) 17 g packet, Take 17 g by mouth daily as needed. (Patient taking differently: Take 17 g by mouth daily as needed for moderate constipation.), Disp: 14 each, Rfl: 0   Polyvinyl Alcohol-Povidone (REFRESH OP), Place 1 drop into both eyes daily as needed (tired eyes)., Disp: , Rfl:    predniSONE (DELTASONE) 50 MG tablet, Take 13 hours, 7 hours and 1 hour prior to CT scan, Disp: 3 tablet, Rfl: 0   primidone (MYSOLINE) 50 MG tablet, Take 25 mg by mouth daily as needed (for tremors)., Disp: , Rfl:     pyridOXINE (VITAMIN B-6) 100 MG tablet, Take 50 mg by mouth in the morning., Disp: , Rfl:    tiZANidine (ZANAFLEX) 2 MG tablet, Take 1 mg by mouth daily as needed for muscle spasms., Disp: , Rfl:    Allergies: Allergies  Allergen Reactions   Ivp Dye [Iodinated Contrast Media] Hives   Lisinopril Cough    REVIEW OF SYSTEMS:   Review of Systems  Constitutional:  Negative for chills, fatigue and fever.  HENT:   Negative for lump/mass, mouth sores, nosebleeds, sore throat and trouble swallowing.   Eyes:  Negative for eye problems.  Respiratory:  Negative for cough and shortness of breath.   Cardiovascular:  Negative for chest pain, leg swelling and palpitations.  Gastrointestinal:  Negative for abdominal pain, constipation, diarrhea, nausea and vomiting.  Genitourinary:  Negative for bladder incontinence, difficulty urinating, dysuria, frequency, hematuria and nocturia.        +decreased output, feels urge to urinate  Musculoskeletal:  Negative for arthralgias, back pain, flank pain, myalgias and neck pain.  Skin:  Positive for itching. Negative for rash.  Neurological:  Positive for dizziness, headaches and numbness (hands).  Hematological:  Does not bruise/bleed easily.  Psychiatric/Behavioral:  Negative for depression, sleep disturbance and suicidal ideas. The patient is not nervous/anxious.   All other systems reviewed and are negative.    VITALS:   Weight 125 lb 3.2 oz (56.8 kg).  Wt Readings from Last 3 Encounters:  01/21/23 125 lb 3.2 oz (56.8 kg)  11/16/22 116 lb (52.6 kg)  11/02/22 118 lb 3.2 oz (53.6 kg)    Body mass index is 20.21 kg/m.  Performance status (ECOG): 1 - Symptomatic but completely ambulatory  PHYSICAL EXAM:   Physical Exam Vitals and nursing note reviewed. Exam conducted with a chaperone present.  Constitutional:      Appearance: Normal appearance.  Cardiovascular:     Rate and Rhythm: Normal rate and regular rhythm.     Pulses: Normal pulses.      Heart sounds: Normal heart sounds.  Pulmonary:     Effort: Pulmonary effort is normal.     Breath sounds: Normal breath sounds.  Abdominal:     Palpations: Abdomen is soft. There is no hepatomegaly, splenomegaly or mass.     Tenderness: There is no abdominal tenderness.  Musculoskeletal:     Right lower leg: No edema.     Left lower leg: No edema.  Lymphadenopathy:     Cervical: No cervical adenopathy.     Right cervical: No superficial, deep or posterior cervical adenopathy.    Left cervical: No superficial, deep or posterior cervical adenopathy.     Upper Body:     Right upper body: No supraclavicular or axillary adenopathy.     Left upper body: No supraclavicular or axillary adenopathy.  Neurological:     General: No focal deficit present.     Mental Status: She is alert and oriented to person, place, and time.  Psychiatric:        Mood and Affect: Mood normal.        Behavior: Behavior normal.     LABS:      Latest Ref Rng & Units 01/21/2023   10:38 AM 12/10/2022    9:49 AM 11/16/2022    9:23 AM  CBC  WBC 4.0 - 10.5 K/uL 7.4  17.7  5.0   Hemoglobin 12.0 - 15.0 g/dL 16.1  09.6  9.8   Hematocrit 36.0 - 46.0 % 30.9  34.2  29.8   Platelets 150 - 400 K/uL 333  322  296       Latest Ref Rng & Units 01/21/2023   10:38 AM 12/10/2022    9:49 AM 11/16/2022    9:23 AM  CMP  Glucose 70 - 99 mg/dL 045  409  811   BUN 8 - 23 mg/dL 47  38  40   Creatinine 0.44 - 1.00 mg/dL 9.14  7.82  9.56   Sodium 135 - 145 mmol/L 132  132  134   Potassium 3.5 - 5.1 mmol/L 4.2  4.5  4.2   Chloride 98 - 111 mmol/L 103  103  108   CO2 22 - 32 mmol/L 20  22  18    Calcium 8.9 - 10.3 mg/dL 9.9  9.3  9.1   Total Protein 6.5 - 8.1 g/dL 6.8  6.4  6.3   Total Bilirubin 0.3 - 1.2 mg/dL 0.8  0.6  0.6   Alkaline Phos 38 - 126 U/L 245  186  238   AST 15 - 41 U/L 27  32  47   ALT 0 - 44 U/L 37  86  125      No results found for: "CEA1", "CEA" / No results found for: "CEA1", "CEA" No results found  for: "PSA1" Lab Results  Component Value Date   OZH086 445 (H) 11/16/2022   No results found for: "  QMV784"  No results found for: "TOTALPROTELP", "ALBUMINELP", "A1GS", "A2GS", "BETS", "BETA2SER", "GAMS", "MSPIKE", "SPEI" Lab Results  Component Value Date   TIBC 438 06/18/2022   FERRITIN 32 06/18/2022   IRONPCTSAT 13 06/18/2022   No results found for: "LDH"   STUDIES:   No results found.

## 2023-01-21 ENCOUNTER — Other Ambulatory Visit (HOSPITAL_COMMUNITY): Payer: Self-pay

## 2023-01-21 ENCOUNTER — Inpatient Hospital Stay: Payer: Medicare HMO

## 2023-01-21 ENCOUNTER — Other Ambulatory Visit: Payer: Self-pay

## 2023-01-21 ENCOUNTER — Inpatient Hospital Stay: Payer: Medicare HMO | Attending: Hematology | Admitting: Hematology

## 2023-01-21 VITALS — Wt 125.2 lb

## 2023-01-21 DIAGNOSIS — Z808 Family history of malignant neoplasm of other organs or systems: Secondary | ICD-10-CM | POA: Diagnosis not present

## 2023-01-21 DIAGNOSIS — R300 Dysuria: Secondary | ICD-10-CM | POA: Diagnosis not present

## 2023-01-21 DIAGNOSIS — C259 Malignant neoplasm of pancreas, unspecified: Secondary | ICD-10-CM

## 2023-01-21 DIAGNOSIS — D649 Anemia, unspecified: Secondary | ICD-10-CM | POA: Insufficient documentation

## 2023-01-21 DIAGNOSIS — Z950 Presence of cardiac pacemaker: Secondary | ICD-10-CM | POA: Diagnosis not present

## 2023-01-21 DIAGNOSIS — Z8041 Family history of malignant neoplasm of ovary: Secondary | ICD-10-CM | POA: Insufficient documentation

## 2023-01-21 DIAGNOSIS — R918 Other nonspecific abnormal finding of lung field: Secondary | ICD-10-CM | POA: Diagnosis not present

## 2023-01-21 DIAGNOSIS — Z800 Family history of malignant neoplasm of digestive organs: Secondary | ICD-10-CM | POA: Diagnosis not present

## 2023-01-21 DIAGNOSIS — Z803 Family history of malignant neoplasm of breast: Secondary | ICD-10-CM | POA: Diagnosis not present

## 2023-01-21 DIAGNOSIS — C250 Malignant neoplasm of head of pancreas: Secondary | ICD-10-CM | POA: Diagnosis present

## 2023-01-21 LAB — URINALYSIS, ROUTINE W REFLEX MICROSCOPIC
Bilirubin Urine: NEGATIVE
Glucose, UA: NEGATIVE mg/dL
Hgb urine dipstick: NEGATIVE
Ketones, ur: NEGATIVE mg/dL
Nitrite: NEGATIVE
Protein, ur: 30 mg/dL — AB
Specific Gravity, Urine: 1.016 (ref 1.005–1.030)
WBC, UA: >50 WBC/hpf (ref 0–5)
pH: 5 (ref 5.0–8.0)

## 2023-01-21 LAB — COMPREHENSIVE METABOLIC PANEL
ALT: 37 U/L (ref 0–44)
AST: 27 U/L (ref 15–41)
Albumin: 3.4 g/dL — ABNORMAL LOW (ref 3.5–5.0)
Alkaline Phosphatase: 245 U/L — ABNORMAL HIGH (ref 38–126)
Anion gap: 9 (ref 5–15)
BUN: 47 mg/dL — ABNORMAL HIGH (ref 8–23)
CO2: 20 mmol/L — ABNORMAL LOW (ref 22–32)
Calcium: 9.9 mg/dL (ref 8.9–10.3)
Chloride: 103 mmol/L (ref 98–111)
Creatinine, Ser: 1.19 mg/dL — ABNORMAL HIGH (ref 0.44–1.00)
GFR, Estimated: 45 mL/min — ABNORMAL LOW (ref >60)
Glucose, Bld: 134 mg/dL — ABNORMAL HIGH (ref 70–99)
Potassium: 4.2 mmol/L (ref 3.5–5.1)
Sodium: 132 mmol/L — ABNORMAL LOW (ref 135–145)
Total Bilirubin: 0.8 mg/dL (ref 0.3–1.2)
Total Protein: 6.8 g/dL (ref 6.5–8.1)

## 2023-01-21 LAB — CBC WITH DIFFERENTIAL/PLATELET
Abs Immature Granulocytes: 0.04 10*3/uL (ref 0.00–0.07)
Basophils Absolute: 0 10*3/uL (ref 0.0–0.1)
Basophils Relative: 0 %
Eosinophils Absolute: 0.2 10*3/uL (ref 0.0–0.5)
Eosinophils Relative: 3 %
HCT: 30.9 % — ABNORMAL LOW (ref 36.0–46.0)
Hemoglobin: 10 g/dL — ABNORMAL LOW (ref 12.0–15.0)
Immature Granulocytes: 1 %
Lymphocytes Relative: 14 %
Lymphs Abs: 1 10*3/uL (ref 0.7–4.0)
MCH: 34 pg (ref 26.0–34.0)
MCHC: 32.4 g/dL (ref 30.0–36.0)
MCV: 105.1 fL — ABNORMAL HIGH (ref 80.0–100.0)
Monocytes Absolute: 0.7 10*3/uL (ref 0.1–1.0)
Monocytes Relative: 10 %
Neutro Abs: 5.3 10*3/uL (ref 1.7–7.7)
Neutrophils Relative %: 72 %
Platelets: 333 10*3/uL (ref 150–400)
RBC: 2.94 MIL/uL — ABNORMAL LOW (ref 3.87–5.11)
RDW: 16.1 % — ABNORMAL HIGH (ref 11.5–15.5)
WBC: 7.4 10*3/uL (ref 4.0–10.5)
nRBC: 0 % (ref 0.0–0.2)

## 2023-01-21 LAB — MAGNESIUM: Magnesium: 2.1 mg/dL (ref 1.7–2.4)

## 2023-01-21 MED ORDER — HEPARIN SOD (PORK) LOCK FLUSH 100 UNIT/ML IV SOLN
500.0000 [IU] | Freq: Once | INTRAVENOUS | Status: DC
  Administered 2023-01-21: 500 [IU] via INTRAVENOUS

## 2023-01-21 MED ORDER — CIPROFLOXACIN HCL 500 MG PO TABS: 500.0000 mg | ORAL_TABLET | Freq: Two times a day (BID) | ORAL | 0 refills | Status: DC

## 2023-01-21 MED ORDER — SODIUM CHLORIDE 0.9% FLUSH
10.0000 mL | INTRAVENOUS | Status: DC | PRN
  Administered 2023-01-21: 10 mL via INTRAVENOUS

## 2023-01-21 NOTE — Patient Instructions (Addendum)
[facility]  Discharge Instructions  You were seen and examined today by Dr. Ellin Saba.  Dr. Ellin Saba discussed your most recent lab work which are stable.  Follow-up as scheduled.  Thank you for choosing [facility] to provide your oncology and hematology care.   To afford each patient quality time with our provider, please arrive at least 15 minutes before your scheduled appointment time. You may need to reschedule your appointment if you arrive late (10 or more minutes). Arriving late affects you and other patients whose appointments are after yours.  Also, if you miss three or more appointments without notifying the office, you may be dismissed from the clinic at the provider's discretion.    Again, thank you for choosing [facility].  Our hope is that these requests will decrease the amount of time that you wait before being seen by our physicians.   If you have a lab appointment with the Cancer Center - please note that after April 8th, all labs will be drawn in the cancer center.  You do not have to check in or register with the main entrance as you have in the past but will complete your check-in at the cancer center.            _____________________________________________________________  Should you have questions after your visit to [facility], please contact our office at [phone] and follow the prompts.  Our office hours are 8:00 a.m. to 4:30 p.m. Monday - Thursday and 8:00 a.m. to 2:30 p.m. Friday.  Please note that voicemails left after 4:00 p.m. may not be returned until the following business day.  We are closed weekends and all major holidays.  You do have access to a nurse 24-7, just call the main number to the clinic [phone] and do not press any options, hold on the line and a nurse will answer the phone.    For prescription refill requests, have your pharmacy contact our office  and allow 72 hours.    Masks are no longer required in the cancer centers. If you would like for your care team to wear a mask while they are taking care of you, please let them know. You may have one support person who is at least 86 years old accompany you for your appointments.

## 2023-01-21 NOTE — Progress Notes (Signed)
Patient is taking Xeloda as prescribed.  She has not missed any doses and reports no side effects at this time.   

## 2023-01-22 ENCOUNTER — Other Ambulatory Visit: Payer: Self-pay | Admitting: Hematology

## 2023-01-22 ENCOUNTER — Other Ambulatory Visit: Payer: Self-pay

## 2023-01-23 LAB — URINE CULTURE: Culture: >=100000 — AB

## 2023-01-25 ENCOUNTER — Other Ambulatory Visit: Payer: Self-pay

## 2023-01-25 ENCOUNTER — Other Ambulatory Visit (HOSPITAL_COMMUNITY): Payer: Self-pay

## 2023-01-25 ENCOUNTER — Other Ambulatory Visit: Payer: Self-pay | Admitting: Hematology

## 2023-01-25 DIAGNOSIS — C259 Malignant neoplasm of pancreas, unspecified: Secondary | ICD-10-CM

## 2023-01-27 ENCOUNTER — Other Ambulatory Visit: Payer: Self-pay

## 2023-01-27 DIAGNOSIS — C259 Malignant neoplasm of pancreas, unspecified: Secondary | ICD-10-CM

## 2023-01-28 ENCOUNTER — Other Ambulatory Visit: Payer: Self-pay | Admitting: *Deleted

## 2023-01-28 ENCOUNTER — Encounter: Payer: Self-pay | Admitting: Hematology

## 2023-01-28 ENCOUNTER — Telehealth: Payer: Self-pay | Admitting: *Deleted

## 2023-01-28 MED ORDER — NITROFURANTOIN MONOHYD MACRO 100 MG PO CAPS: 100.0000 mg | ORAL_CAPSULE | Freq: Two times a day (BID) | ORAL | 0 refills | Status: DC

## 2023-01-28 NOTE — Telephone Encounter (Signed)
Sensitivity results show that bacteria is resistant to Cipro, therefore Macrobid 100 mg bid sent to pharmacy.  Patient notified to stop Cipro and pick up new AB.  She did state that she was not feeling any better on the Cipro.

## 2023-01-28 NOTE — Progress Notes (Signed)
Macrobid 100 mg bid x 7 days sent to pharmacy.  Patient aware.

## 2023-02-02 ENCOUNTER — Other Ambulatory Visit (HOSPITAL_COMMUNITY): Payer: Self-pay

## 2023-02-03 ENCOUNTER — Inpatient Hospital Stay: Payer: Medicare HMO

## 2023-02-03 ENCOUNTER — Inpatient Hospital Stay: Payer: Medicare HMO | Admitting: Hematology

## 2023-02-03 ENCOUNTER — Other Ambulatory Visit (HOSPITAL_COMMUNITY): Payer: Self-pay

## 2023-02-04 ENCOUNTER — Other Ambulatory Visit: Payer: Self-pay

## 2023-02-07 NOTE — Progress Notes (Signed)
[facility] [address] [place] [address]    Clinic Day:  02/08/2023  Referring physician: [name], [name]  Patient Care Team: [name], [name] as PCP - General (Family Medicine) Meagan Massed, MD as Medical Oncologist (Medical Oncology) [name], RN as Oncology Nurse Navigator (Medical Oncology)   ASSESSMENT & PLAN:   Assessment: 1. T2 N0 M0 pancreatic adenocarcinoma: - She presented with painless jaundice in April 2023.  Weight loss of 13 pounds since April. - Stent placement on 09/10/2021 for a bilirubin of 8.2. - 10/03/2021: ERCP and stent removal and stent placement for stent dysfunction with total bilirubin 16.3. - 10/29/2021: ERCP/EUS: Lower CBD stent stenosis from stricture, status post metal stent placement.  EUS showed mass in the superior pancreatic head measuring 2.8 x 2.2 cm, suggesting abutment of the portal vein.  Intact interface was seen between lesion and SMA and celiac trunk suggesting lack of invasion.  Hypoechoic 14 x 12 mm lesion visualized in the liver.  Biopsy could not be attempted due to presence of blood vessel. - She had syncopal episodes in Jul 10, 2022, had pacemaker placement for bradycardia. - Pathology: Pancreatic head FNA and biliary stricture brushing: Malignant cells consistent with adenocarcinoma. - CA 19-9: 131 - MRI of the abdomen with and without contrast (12/24/2021): Pancreatic head mass measures 2.7 x 2.3 cm (2.5 x 1.8 cm previously).  Mass is abutting and slightly displacing the SMA.  No obvious fat plane is demonstrated.  Portal and splenic veins are patent.  Stable small scattered hepatic cysts with no liver metastatic disease. - PET scan (11/20/2021): Ill-defined low-level hypermetabolic activity about the metallic CBD stent and pancreatic uncinate process without discrete CT correlate.  No convincing evidence of hypermetabolic disease in the neck, chest, abdomen or pelvis.  Tiny bilateral lung nodules  measuring up to 3 mm.  -CT pancreatic protocol (01/16/2022): Hypoenhancing mass in the pancreatic head measuring 2.9 x 1.8 cm.  Slightly greater than 180 degrees of abutment with portal vein as well as SMA without definite vascular encasement.  New enhancing 9 mm soft tissue nodule along the posterior aspect of the gallbladder wall?  Suspicious for peritoneal implant. - Evaluated by Dr. Freida Busman, hepatobiliary surgeon and felt to be borderline candidate prior to neoadjuvant therapy - 4 cycles of gemcitabine and Abraxane from 02/05/2022 through 06/18/2022  - Guardant360 did not show MSI high.  No other targetable mutations.  Limited tissue NGS testing did not show any targetable mutations.  MS-stable.  TMB-low. - 6 cycles of FOLFOX from 08/31/2022 through 11/16/2022.  She could not tolerate FOLFIRINOX. - XRT with Xeloda started around 01/04/2023 - Germline mutation testing: Negative.   2. Social/family history: - She lives at home with her husband who has dementia.  She worked as a Conservation officer, nature for 33 years.  Non-smoker and no exposure to chemicals.  She has not been driving since 07/10/22. - Daughter died of glioblastoma.  Mother had ovarian cancer.  Maternal aunt had breast cancer.    Plan: T2 N0 M0 pancreatic adenocarcinoma: - She has missed XRT last Tuesday and Wednesday as she was not feeling well.  She had 1 episode of vomiting on Tuesday. - She reported pain in her right knee which has spread to the left knee and right hip on Saturday.  The hip pain resolved.  Left knee pain and swelling also improved.  However continues to have right knee swelling and some pain. - We checked a uric acid today which  was normal at 5.2. - She is using a rollator to walk. - She had a UTI based on UA from 01/21/2023.  Initially treated with Cipro.  However cultures came back E. coli which was not sensitive to Cipro.  We have called in Macrobid for 7 days which she has completed.  She still continues to have urinary  symptoms.  We have checked another UA today.  It was consistent with UTI.  We will call in another course of Macrobid for 7 more days. - She does not have any mucositis or hand-foot skin reaction. - Physical exam: Right knee is swollen but not red.  There are some tender points in above the patella.  Does not appear to be septic arthritis. - For her joint pain and swellings, we will give Medrol Dosepak.  Also recommended to take NSAIDs.  If there is no improvement in the next couple of days, she was told to go to the emergency room. - She will finish her radiation this week.  She will continue Xeloda 2 tablets twice daily throughout the course of radiation. - Recommend follow-up in 5 weeks with CTAP pancreatic protocol and CA 19-9 levels.  2.  Weight loss: - Continue Megace 400 mg twice daily.  She is also drinking Ensure/boost 1 to 2 cans/day. - Weight is stable.   3.  Normocytic anemia: - Last Venofer on 07/31/2022.  Hemoglobin today is 9.8.    Orders Placed This Encounter  Procedures   Urine culture    Standing Status:   Future    Number of Occurrences:   1    Standing Expiration Date:   02/08/2024   CT ABD PELVIS W/WO CM ONCOLOGY PANCREATIC PROTOCOL    Standing Status:   Future    Standing Expiration Date:   02/08/2024    Order Specific Question:   If indicated for the ordered procedure, I authorize the administration of contrast media per Radiology protocol    Answer:   Yes    Order Specific Question:   Does the patient have a contrast media/X-ray dye allergy?    Answer:   No    Order Specific Question:   Preferred imaging location?    Answer:   [facility]    Order Specific Question:   If indicated for the ordered procedure, I authorize the administration of oral contrast media per Radiology protocol    Answer:   Yes   Uric acid    Order Specific Question:   Release to patient    Answer:   Immediate   Urinalysis, Routine w reflex microscopic    Standing Status:   Future     Number of Occurrences:   1    Standing Expiration Date:   02/08/2024   CBC with Differential    Standing Status:   Future    Standing Expiration Date:   02/08/2024   Comprehensive metabolic panel    Standing Status:   Future    Standing Expiration Date:   02/08/2024   Magnesium    Standing Status:   Future    Standing Expiration Date:   02/08/2024   Cancer antigen 19-9    Standing Status:   Future    Standing Expiration Date:   02/08/2024      I,Katie Daubenspeck,acting as a scribe for Meagan Massed, MD.,have documented all relevant documentation on the behalf of Meagan Massed, MD,as directed by  Meagan Massed, MD while in the presence of Meagan Massed, MD.  I, Meagan Massed MD, have reviewed the above documentation for accuracy and completeness, and I agree with the above.   Meagan Massed, MD   9/9/20244:41 PM  CHIEF COMPLAINT:   Diagnosis: pancreatic adenocarcinoma    Cancer Staging  Pancreatic carcinoma [facility]) Staging form: Exocrine Pancreas, AJCC 8th Edition - Clinical stage from 11/11/2021: Stage IB (cT2, cN0, cM0) - Unsigned    Prior Therapy: Gemcitabine and Abraxane   Current Therapy:  FOLFOX    HISTORY OF PRESENT ILLNESS:   Oncology History  Pancreatic carcinoma (HCC)  11/11/2021 Initial Diagnosis   Pancreatic carcinoma (HCC)   02/05/2022 - 07/02/2022 Chemotherapy   Patient is on Treatment Plan : PANCREATIC Abraxane D1,15 + Gemcitabine D1,15 q28d     08/31/2022 -  Chemotherapy   Patient is on Treatment Plan : PANCREAS Modified FOLFIRINOX q14d x 4 cycles     12/22/2022 Genetic Testing   No pathogenic variants identified on the [facility] Multi-Cancer+RNA panel. VUS in BMPR1A called Gain (Promoter, Non-coding exon 1, Non-coding exon 2, Exons 3-13) identified. The report date is 12/22/2022.  The Multi-Cancer + RNA Panel offered by [facility] includes sequencing and/or deletion/duplication analysis of the following 70 genes:  AIP*, ALK, APC*,  ATM*, AXIN2*, BAP1*, BARD1*, BLM*, BMPR1A*, BRCA1*, BRCA2*, BRIP1*, CDC73*, CDH1*, CDK4, CDKN1B*, CDKN2A, CHEK2*, CTNNA1*, DICER1*, EPCAM, EGFR, FH*, FLCN*, GREM1, HOXB13, KIT, LZTR1, MAX*, MBD4, MEN1*, MET, MITF, MLH1*, MSH2*, MSH3*, MSH6*, MUTYH*, NF1*, NF2*, NTHL1*, PALB2*, PDGFRA, PMS2*, POLD1*, POLE*, POT1*, PRKAR1A*, PTCH1*, PTEN*, RAD51C*, RAD51D*, RB1*, RET, SDHA*, SDHAF2*, SDHB*, SDHC*, SDHD*, SMAD4*, SMARCA4*, SMARCB1*, SMARCE1*, STK11*, SUFU*, TMEM127*, TP53*, TSC1*, TSC2*, VHL*. RNA analysis is performed for * genes.      INTERVAL HISTORY:   Meagan [name] is a 88 y.o. female presenting to clinic today for follow up of pancreatic adenocarcinoma. She was last seen by me on 01/21/23.  Today, she states that she is doing well overall. Her appetite level is at 50%. Her energy level is at 10%.  PAST MEDICAL HISTORY:   Past Medical History: Past Medical History:  Diagnosis Date   Essential tremor    GERD (gastroesophageal reflux disease)    High cholesterol    Hypertension    PONV (postoperative nausea and vomiting)    Port-A-Cath in place 01/27/2022   Vertigo     Surgical History: Past Surgical History:  Procedure Laterality Date   BILIARY BRUSHING  09/10/2021   Procedure: BILIARY BRUSHING;  Surgeon: Hilarie Fredrickson, MD;  Location: [facility] ENDOSCOPY;  Service: Gastroenterology;;   BILIARY BRUSHING  10/29/2021   Procedure: BILIARY BRUSHING;  Surgeon: Lemar Lofty., MD;  Location: [facility] ENDOSCOPY;  Service: Gastroenterology;;   BILIARY DILATION  10/29/2021   Procedure: BILIARY DILATION;  Surgeon: Lemar Lofty., MD;  Location: [facility] ENDOSCOPY;  Service: Gastroenterology;;   BILIARY STENT PLACEMENT  09/10/2021   Procedure: BILIARY STENT PLACEMENT;  Surgeon: Hilarie Fredrickson, MD;  Location: [facility] ENDOSCOPY;  Service: Gastroenterology;;   BILIARY STENT PLACEMENT N/A 10/03/2021   Procedure: BILIARY STENT PLACEMENT;  Surgeon: Iva Boop, MD;  Location: [facility] ENDOSCOPY;  Service:  Gastroenterology;  Laterality: N/A;   BILIARY STENT PLACEMENT N/A 10/29/2021   Procedure: BILIARY STENT PLACEMENT;  Surgeon: Meridee Score Netty Starring., MD;  Location: [facility] ENDOSCOPY;  Service: Gastroenterology;  Laterality: N/A;   BILIARY STENT PLACEMENT N/A 05/08/2022   Procedure: BILIARY STENT PLACEMENT;  Surgeon: Iva Boop, MD;  Location: [facility] ENDOSCOPY;  Service: Gastroenterology;  Laterality: N/A;   BIOPSY  10/29/2021   Procedure: BIOPSY;  Surgeon:  Mansouraty, Netty Starring., MD;  Location: [facility] ENDOSCOPY;  Service: Gastroenterology;;   ENDOSCOPIC RETROGRADE CHOLANGIOPANCREATOGRAPHY (ERCP) WITH PROPOFOL N/A 10/29/2021   Procedure: ENDOSCOPIC RETROGRADE CHOLANGIOPANCREATOGRAPHY (ERCP) WITH PROPOFOL;  Surgeon: Lemar Lofty., MD;  Location: [facility] ENDOSCOPY;  Service: Gastroenterology;  Laterality: N/A;   ERCP N/A 09/10/2021   Procedure: ENDOSCOPIC RETROGRADE CHOLANGIOPANCREATOGRAPHY (ERCP);  Surgeon: Hilarie Fredrickson, MD;  Location: [facility] ENDOSCOPY;  Service: Gastroenterology;  Laterality: N/A;   ERCP N/A 10/03/2021   Procedure: ENDOSCOPIC RETROGRADE CHOLANGIOPANCREATOGRAPHY (ERCP);  Surgeon: Iva Boop, MD;  Location: [facility] ENDOSCOPY;  Service: Gastroenterology;  Laterality: N/A;   ERCP N/A 05/08/2022   Procedure: ENDOSCOPIC RETROGRADE CHOLANGIOPANCREATOGRAPHY (ERCP);  Surgeon: Iva Boop, MD;  Location: [facility] ENDOSCOPY;  Service: Gastroenterology;  Laterality: N/A;   ESOPHAGOGASTRODUODENOSCOPY (EGD) WITH PROPOFOL N/A 10/29/2021   Procedure: ESOPHAGOGASTRODUODENOSCOPY (EGD) WITH PROPOFOL;  Surgeon: Meridee Score Netty Starring., MD;  Location: [facility] ENDOSCOPY;  Service: Gastroenterology;  Laterality: N/A;   EUS N/A 10/29/2021   Procedure: UPPER ENDOSCOPIC ULTRASOUND (EUS) RADIAL;  Surgeon: Lemar Lofty., MD;  Location: [facility] ENDOSCOPY;  Service: Gastroenterology;  Laterality: N/A;   FINE NEEDLE ASPIRATION  10/29/2021   Procedure: FINE NEEDLE ASPIRATION (FNA) LINEAR;  Surgeon: Lemar Lofty., MD;   Location: [facility] ENDOSCOPY;  Service: Gastroenterology;;   IR IMAGING GUIDED PORT INSERTION  01/28/2022   PACEMAKER IMPLANT     REMOVAL OF STONES  10/29/2021   Procedure: REMOVAL OF SLUDGE;  Surgeon: Lemar Lofty., MD;  Location: [facility] ENDOSCOPY;  Service: Gastroenterology;;   Dennison Mascot  09/10/2021   Procedure: Dennison Mascot;  Surgeon: Hilarie Fredrickson, MD;  Location: [facility] ENDOSCOPY;  Service: Gastroenterology;;   Burman Freestone CHOLANGIOSCOPY N/A 10/29/2021   Procedure: [id number] CHOLANGIOSCOPY;  Surgeon: Lemar Lofty., MD;  Location: [facility] ENDOSCOPY;  Service: Gastroenterology;  Laterality: N/A;   STENT REMOVAL  10/03/2021   Procedure: STENT REMOVAL;  Surgeon: Iva Boop, MD;  Location: [facility] ENDOSCOPY;  Service: Gastroenterology;;    Social History: Social History   Socioeconomic History   Marital status: Widowed    Spouse name: Not on file   Number of children: Not on file   Years of education: Not on file   Highest education level: Not on file  Occupational History   Not on file  Tobacco Use   Smoking status: Never   Smokeless tobacco: Never  Vaping Use   Vaping status: Never Used  Substance and Sexual Activity   Alcohol use: Never   Drug use: Never   Sexual activity: Not on file  Other Topics Concern   Not on file  Social History Narrative   Not on file   Social Determinants of Health   Financial Resource Strain: Not on file  Food Insecurity: No Food Insecurity (05/07/2022)   Hunger Vital Sign    Worried About Running Out of Food in the Last Year: Never true    Ran Out of Food in the Last Year: Never true  Transportation Needs: No Transportation Needs (05/07/2022)   PRAPARE - Administrator, Civil Service (Medical): No    Lack of Transportation (Non-Medical): No  Physical Activity: Not on file  Stress: Not on file  Social Connections: Not on file  Intimate Partner Violence: Not At Risk (05/07/2022)   Humiliation, Afraid, Rape, and Kick questionnaire     Fear of Current or Ex-Partner: No    Emotionally Abused: No    Physically Abused: No    Sexually Abused: No    Family History: Family  History  Problem Relation Age of Onset   Cancer Mother        unknown, possibly female cancer   Breast cancer Maternal Aunt        dx 58s   Throat cancer Paternal Uncle    Brain cancer Daughter        glioblastoma d. 80   Stomach cancer Neg Hx    Esophageal cancer Neg Hx    Colon cancer Neg Hx    Pancreatic cancer Neg Hx     Current Medications:  Current Outpatient Medications:    amLODipine (NORVASC) 5 MG tablet, 1 tab(s) orally once a day for 90 days, Disp: , Rfl:    aspirin EC 81 MG tablet, Take 81 mg by mouth daily. Swallow whole., Disp: , Rfl:    capecitabine (XELODA) 500 MG tablet, Take 2 tablets (1,000 mg total) by mouth 2 (two) times daily after a meal. Take Monday- Friday. Take only on days of radiation., Disp: 120 tablet, Rfl: 0   Cholecalciferol (VITAMIN D3) 50 MCG (2000 UT) TABS, Take 2,000 Units by mouth daily after supper., Disp: , Rfl:    Coenzyme Q10 (CO Q-10) 100 MG CAPS, Take 100 mg by mouth every evening., Disp: , Rfl:    famotidine (PEPCID) 40 MG tablet, Take 40 mg by mouth every evening., Disp: , Rfl:    gabapentin (NEURONTIN) 100 MG capsule, Take 100 mg by mouth 2 (two) times daily., Disp: , Rfl:    meclizine (ANTIVERT) 25 MG tablet, Take 25 mg by mouth daily as needed for dizziness., Disp: , Rfl:    megestrol (MEGACE) 400 MG/10ML suspension, Take 10 mLs (400 mg total) by mouth 2 (two) times daily., Disp: 480 mL, Rfl: 3   methylPREDNISolone (MEDROL DOSEPAK) 4 MG TBPK tablet, Take as directed, Disp: 1 each, Rfl: 0   Misc Natural Products (JOINT SUPPORT) CAPS, Take 1 capsule by mouth daily with breakfast., Disp: , Rfl:    Multiple Vitamin (MULTIVITAMIN) tablet, Take 1 tablet by mouth daily with breakfast., Disp: , Rfl:    Multiple Vitamins-Minerals (OCUVITE EYE HEALTH FORMULA) CAPS, Take 1 capsule by mouth daily., Disp: ,  Rfl:    nebivolol (BYSTOLIC) 5 MG tablet, Take 2.5 mg by mouth in the morning., Disp: , Rfl:    nitrofurantoin, macrocrystal-monohydrate, (MACROBID) 100 MG capsule, Take 1 capsule (100 mg total) by mouth 2 (two) times daily., Disp: 14 capsule, Rfl: 0   omeprazole (PRILOSEC) 40 MG capsule, Take 40 mg by mouth daily before breakfast., Disp: , Rfl:    polyethylene glycol (MIRALAX) 17 g packet, Take 17 g by mouth daily as needed. (Patient taking differently: Take 17 g by mouth daily as needed for moderate constipation.), Disp: 14 each, Rfl: 0   Polyvinyl Alcohol-Povidone (REFRESH OP), Place 1 drop into both eyes daily as needed (tired eyes)., Disp: , Rfl:    predniSONE (DELTASONE) 50 MG tablet, Take 13 hours, 7 hours and 1 hour prior to CT scan, Disp: 3 tablet, Rfl: 0   primidone (MYSOLINE) 50 MG tablet, Take 25 mg by mouth daily as needed (for tremors)., Disp: , Rfl:    pyridOXINE (VITAMIN B-6) 100 MG tablet, Take 50 mg by mouth in the morning., Disp: , Rfl:    tiZANidine (ZANAFLEX) 2 MG tablet, Take 1 mg by mouth daily as needed for muscle spasms., Disp: , Rfl:    Allergies: Allergies  Allergen Reactions   Ivp Dye [Iodinated Contrast Media] Hives   Lisinopril Cough  REVIEW OF SYSTEMS:   Review of Systems  Constitutional:  Negative for chills, fatigue and fever.  HENT:   Negative for lump/mass, mouth sores, nosebleeds, sore throat and trouble swallowing.   Eyes:  Negative for eye problems.  Respiratory:  Positive for cough. Negative for shortness of breath.   Cardiovascular:  Negative for chest pain, leg swelling and palpitations.  Gastrointestinal:  Positive for vomiting. Negative for abdominal pain, constipation, diarrhea and nausea.  Genitourinary:  Negative for bladder incontinence, difficulty urinating, dysuria, frequency, hematuria and nocturia.   Musculoskeletal:  Positive for arthralgias. Negative for back pain, flank pain, myalgias and neck pain.  Skin:  Negative for itching and  rash.  Neurological:  Positive for numbness. Negative for dizziness and headaches.  Hematological:  Does not bruise/bleed easily.  Psychiatric/Behavioral:  Negative for depression, sleep disturbance and suicidal ideas. The patient is not nervous/anxious.   All other systems reviewed and are negative.    VITALS:   Weight 128 lb (58.1 kg).  Wt Readings from Last 3 Encounters:  02/08/23 128 lb (58.1 kg)  01/21/23 125 lb 3.2 oz (56.8 kg)  11/16/22 116 lb (52.6 kg)    Body mass index is 20.66 kg/m.  Performance status (ECOG): 2 - Symptomatic, <50% confined to bed  PHYSICAL EXAM:   Physical Exam Vitals and nursing note reviewed. Exam conducted with a chaperone present.  Constitutional:      Appearance: Normal appearance.  Cardiovascular:     Rate and Rhythm: Normal rate and regular rhythm.     Pulses: Normal pulses.     Heart sounds: Normal heart sounds.  Pulmonary:     Effort: Pulmonary effort is normal.     Breath sounds: Normal breath sounds.  Abdominal:     Palpations: Abdomen is soft. There is no hepatomegaly, splenomegaly or mass.     Tenderness: There is no abdominal tenderness.  Musculoskeletal:     Right lower leg: No edema.     Left lower leg: No edema.  Lymphadenopathy:     Cervical: No cervical adenopathy.     Right cervical: No superficial, deep or posterior cervical adenopathy.    Left cervical: No superficial, deep or posterior cervical adenopathy.     Upper Body:     Right upper body: No supraclavicular or axillary adenopathy.     Left upper body: No supraclavicular or axillary adenopathy.  Neurological:     General: No focal deficit present.     Mental Status: She is alert and oriented to person, place, and time.  Psychiatric:        Mood and Affect: Mood normal.        Behavior: Behavior normal.     LABS:      Latest Ref Rng & Units 02/08/2023    9:24 AM 01/21/2023   10:38 AM 12/10/2022    9:49 AM  CBC  WBC 4.0 - 10.5 K/uL 14.8  7.4  17.7    Hemoglobin 12.0 - 15.0 g/dL 9.8  04.5  40.9   Hematocrit 36.0 - 46.0 % 30.0  30.9  34.2   Platelets 150 - 400 K/uL 277  333  322       Latest Ref Rng & Units 02/08/2023    9:24 AM 01/21/2023   10:38 AM 12/10/2022    9:49 AM  CMP  Glucose 70 - 99 mg/dL 811  914  782   BUN 8 - 23 mg/dL 36  47  38   Creatinine 0.44 - 1.00  mg/dL 4.09  8.11  9.14   Sodium 135 - 145 mmol/L 134  132  132   Potassium 3.5 - 5.1 mmol/L 4.1  4.2  4.5   Chloride 98 - 111 mmol/L 103  103  103   CO2 22 - 32 mmol/L 23  20  22    Calcium 8.9 - 10.3 mg/dL 9.6  9.9  9.3   Total Protein 6.5 - 8.1 g/dL 6.4  6.8  6.4   Total Bilirubin 0.3 - 1.2 mg/dL 1.2  0.8  0.6   Alkaline Phos 38 - 126 U/L 223  245  186   AST 15 - 41 U/L 21  27  32   ALT 0 - 44 U/L 36  37  86      No results found for: "CEA1", "CEA" / No results found for: "CEA1", "CEA" No results found for: "PSA1" Lab Results  Component Value Date   NWG956 445 (H) 11/16/2022   No results found for: "OZH086"  No results found for: "TOTALPROTELP", "ALBUMINELP", "A1GS", "A2GS", "BETS", "BETA2SER", "GAMS", "MSPIKE", "SPEI" Lab Results  Component Value Date   TIBC 438 06/18/2022   FERRITIN 32 06/18/2022   IRONPCTSAT 13 06/18/2022   No results found for: "LDH"   STUDIES:   No results found.

## 2023-02-08 ENCOUNTER — Other Ambulatory Visit: Payer: Self-pay

## 2023-02-08 ENCOUNTER — Inpatient Hospital Stay: Payer: Medicare HMO | Admitting: Hematology

## 2023-02-08 ENCOUNTER — Inpatient Hospital Stay: Payer: Medicare HMO | Attending: Hematology

## 2023-02-08 VITALS — Wt 128.0 lb

## 2023-02-08 VITALS — BP 118/63 | HR 84 | Temp 98.7°F | Resp 18

## 2023-02-08 DIAGNOSIS — D649 Anemia, unspecified: Secondary | ICD-10-CM | POA: Diagnosis not present

## 2023-02-08 DIAGNOSIS — Z8041 Family history of malignant neoplasm of ovary: Secondary | ICD-10-CM | POA: Insufficient documentation

## 2023-02-08 DIAGNOSIS — Z800 Family history of malignant neoplasm of digestive organs: Secondary | ICD-10-CM | POA: Diagnosis not present

## 2023-02-08 DIAGNOSIS — Z803 Family history of malignant neoplasm of breast: Secondary | ICD-10-CM | POA: Diagnosis not present

## 2023-02-08 DIAGNOSIS — Z950 Presence of cardiac pacemaker: Secondary | ICD-10-CM | POA: Diagnosis not present

## 2023-02-08 DIAGNOSIS — R918 Other nonspecific abnormal finding of lung field: Secondary | ICD-10-CM | POA: Insufficient documentation

## 2023-02-08 DIAGNOSIS — C250 Malignant neoplasm of head of pancreas: Secondary | ICD-10-CM | POA: Insufficient documentation

## 2023-02-08 DIAGNOSIS — C259 Malignant neoplasm of pancreas, unspecified: Secondary | ICD-10-CM

## 2023-02-08 DIAGNOSIS — R300 Dysuria: Secondary | ICD-10-CM | POA: Diagnosis not present

## 2023-02-08 DIAGNOSIS — M25461 Effusion, right knee: Secondary | ICD-10-CM | POA: Diagnosis not present

## 2023-02-08 DIAGNOSIS — N390 Urinary tract infection, site not specified: Secondary | ICD-10-CM | POA: Insufficient documentation

## 2023-02-08 DIAGNOSIS — Z95828 Presence of other vascular implants and grafts: Secondary | ICD-10-CM

## 2023-02-08 DIAGNOSIS — Z808 Family history of malignant neoplasm of other organs or systems: Secondary | ICD-10-CM | POA: Insufficient documentation

## 2023-02-08 DIAGNOSIS — K7689 Other specified diseases of liver: Secondary | ICD-10-CM | POA: Diagnosis not present

## 2023-02-08 LAB — CBC WITH DIFFERENTIAL/PLATELET
Abs Immature Granulocytes: 0.16 10*3/uL — ABNORMAL HIGH (ref 0.00–0.07)
Basophils Absolute: 0.1 10*3/uL (ref 0.0–0.1)
Basophils Relative: 0 %
Eosinophils Absolute: 0.2 10*3/uL (ref 0.0–0.5)
Eosinophils Relative: 1 %
HCT: 30 % — ABNORMAL LOW (ref 36.0–46.0)
Hemoglobin: 9.8 g/dL — ABNORMAL LOW (ref 12.0–15.0)
Immature Granulocytes: 1 %
Lymphocytes Relative: 6 %
Lymphs Abs: 0.9 10*3/uL (ref 0.7–4.0)
MCH: 34.5 pg — ABNORMAL HIGH (ref 26.0–34.0)
MCHC: 32.7 g/dL (ref 30.0–36.0)
MCV: 105.6 fL — ABNORMAL HIGH (ref 80.0–100.0)
Monocytes Absolute: 1.8 10*3/uL — ABNORMAL HIGH (ref 0.1–1.0)
Monocytes Relative: 12 %
Neutro Abs: 11.8 10*3/uL — ABNORMAL HIGH (ref 1.7–7.7)
Neutrophils Relative %: 80 %
Platelets: 277 10*3/uL (ref 150–400)
RBC: 2.84 MIL/uL — ABNORMAL LOW (ref 3.87–5.11)
RDW: 18.4 % — ABNORMAL HIGH (ref 11.5–15.5)
WBC: 14.8 10*3/uL — ABNORMAL HIGH (ref 4.0–10.5)
nRBC: 0 % (ref 0.0–0.2)

## 2023-02-08 LAB — TSH: TSH: 2.249 u[IU]/mL (ref 0.350–4.500)

## 2023-02-08 LAB — COMPREHENSIVE METABOLIC PANEL
ALT: 36 U/L (ref 0–44)
AST: 21 U/L (ref 15–41)
Albumin: 2.9 g/dL — ABNORMAL LOW (ref 3.5–5.0)
Alkaline Phosphatase: 223 U/L — ABNORMAL HIGH (ref 38–126)
Anion gap: 8 (ref 5–15)
BUN: 36 mg/dL — ABNORMAL HIGH (ref 8–23)
CO2: 23 mmol/L (ref 22–32)
Calcium: 9.6 mg/dL (ref 8.9–10.3)
Chloride: 103 mmol/L (ref 98–111)
Creatinine, Ser: 1.19 mg/dL — ABNORMAL HIGH (ref 0.44–1.00)
GFR, Estimated: 45 mL/min — ABNORMAL LOW (ref >60)
Glucose, Bld: 137 mg/dL — ABNORMAL HIGH (ref 70–99)
Potassium: 4.1 mmol/L (ref 3.5–5.1)
Sodium: 134 mmol/L — ABNORMAL LOW (ref 135–145)
Total Bilirubin: 1.2 mg/dL (ref 0.3–1.2)
Total Protein: 6.4 g/dL — ABNORMAL LOW (ref 6.5–8.1)

## 2023-02-08 LAB — URINALYSIS, ROUTINE W REFLEX MICROSCOPIC
Bilirubin Urine: NEGATIVE
Glucose, UA: NEGATIVE mg/dL
Hgb urine dipstick: NEGATIVE
Ketones, ur: NEGATIVE mg/dL
Nitrite: NEGATIVE
Protein, ur: 30 mg/dL — AB
Specific Gravity, Urine: 1.02 (ref 1.005–1.030)
WBC, UA: >50 WBC/hpf (ref 0–5)
pH: 5 (ref 5.0–8.0)

## 2023-02-08 LAB — MAGNESIUM: Magnesium: 2 mg/dL (ref 1.7–2.4)

## 2023-02-08 LAB — URIC ACID: Uric Acid, Serum: 5.2 mg/dL (ref 2.5–7.1)

## 2023-02-08 MED ORDER — METHYLPREDNISOLONE 4 MG PO TBPK: ORAL_TABLET | ORAL | 0 refills | Status: DC

## 2023-02-08 MED ORDER — NITROFURANTOIN MONOHYD MACRO 100 MG PO CAPS: 100.0000 mg | ORAL_CAPSULE | Freq: Two times a day (BID) | ORAL | 0 refills | Status: DC

## 2023-02-08 MED ORDER — SODIUM CHLORIDE 0.9% FLUSH
10.0000 mL | INTRAVENOUS | Status: DC | PRN
  Administered 2023-02-08: 10 mL via INTRAVENOUS

## 2023-02-08 MED ORDER — HEPARIN SOD (PORK) LOCK FLUSH 100 UNIT/ML IV SOLN
500.0000 [IU] | Freq: Once | INTRAVENOUS | Status: AC
  Administered 2023-02-08: 500 [IU] via INTRAVENOUS

## 2023-02-08 NOTE — Progress Notes (Signed)
Patients port flushed without difficulty.  Good blood return noted with no bruising or swelling noted at site.  Band aid applied.  VSS with discharge and left in satisfactory condition with no s/s of distress noted.   

## 2023-02-08 NOTE — Patient Instructions (Addendum)
[facility] at [facility] Discharge Instructions   You were seen and examined today by Dr. Ellin Saba.  He reviewed the results of your lab work which are normal/stable.   We will check a urine and culture today.  We sent a Medrol dose pack for you to take for your right knee pain.   We will see you back in 5 weeks. We will repeat a CT scan prior to your next visit.   Return as scheduled.    Thank you for choosing [facility] at [facility] to provide your oncology and hematology care.  To afford each patient quality time with our provider, please arrive at least 15 minutes before your scheduled appointment time.   If you have a lab appointment with the Cancer Center please come in thru the Main Entrance and check in at the main information desk.  You need to re-schedule your appointment should you arrive 10 or more minutes late.  We strive to give you quality time with our providers, and arriving late affects you and other patients whose appointments are after yours.  Also, if you no show three or more times for appointments you may be dismissed from the clinic at the providers discretion.     Again, thank you for choosing [facility].  Our hope is that these requests will decrease the amount of time that you wait before being seen by our physicians.       _____________________________________________________________  Should you have questions after your visit to [facility], please contact our office at [phone] and follow the prompts.  Our office hours are 8:00 a.m. and 4:30 p.m. Monday - Friday.  Please note that voicemails left after 4:00 p.m. may not be returned until the following business day.  We are closed weekends and major holidays.  You do have access to a nurse 24-7, just call the main number to the clinic [phone] and do not press any options, hold on the line and a nurse will answer the phone.     For prescription refill requests, have your pharmacy contact our office and allow 72 hours.    Due to Covid, you will need to wear a mask upon entering the hospital. If you do not have a mask, a mask will be given to you at the Main Entrance upon arrival. For doctor visits, patients may have 1 support person age 5 or older with them. For treatment visits, patients can not have anyone with them due to social distancing guidelines and our immunocompromised population.

## 2023-02-08 NOTE — Progress Notes (Signed)
Patient is taking Xeloda as prescribed.  She has not missed any doses and reports no side effects at this time.   

## 2023-02-09 ENCOUNTER — Telehealth: Payer: Self-pay | Admitting: *Deleted

## 2023-02-09 LAB — CANCER ANTIGEN 19-9: CA 19-9: 2619 U/mL — ABNORMAL HIGH (ref 0–35)

## 2023-02-09 NOTE — Telephone Encounter (Signed)
Per Dr. Ellin Saba, called to let her know that he has called in [facility] for another 7 days as her urine today shows recurrent sign of infection. She also had right knee swelling and pain today. We have given Medrol Dosepak. If the swelling or pain does not improve in the next 2 to 3 days, she is to report to the ER for evaluation.  Meagan [name] verbalized understanding and states that knee is feeling better today.

## 2023-02-10 ENCOUNTER — Other Ambulatory Visit: Payer: Self-pay

## 2023-02-10 LAB — URINE CULTURE: Culture: >=100000 — AB

## 2023-02-15 ENCOUNTER — Encounter: Payer: Self-pay | Admitting: Hematology

## 2023-02-23 ENCOUNTER — Telehealth: Payer: Self-pay | Admitting: *Deleted

## 2023-02-23 NOTE — Telephone Encounter (Signed)
Patient's daughter Maralyn Sago called to advise that she is weak and in need of fluids.  Wanted appointment today or tomorrow.  Unfortunately, there are no open appointments until Thursday.  States she will take her to ER, as she is too weak and cannot wait until Thursday.

## 2023-03-10 ENCOUNTER — Other Ambulatory Visit: Payer: Self-pay | Admitting: *Deleted

## 2023-03-10 MED ORDER — DIPHENHYDRAMINE HCL 50 MG PO TABS: 50.0000 mg | ORAL_TABLET | Freq: Once | ORAL | 0 refills | Status: DC

## 2023-03-10 MED ORDER — PREDNISONE 50 MG PO TABS: ORAL_TABLET | ORAL | 0 refills | Status: DC

## 2023-03-11 ENCOUNTER — Other Ambulatory Visit: Payer: Self-pay

## 2023-03-11 DIAGNOSIS — R0989 Other specified symptoms and signs involving the circulatory and respiratory systems: Secondary | ICD-10-CM

## 2023-03-12 ENCOUNTER — Inpatient Hospital Stay: Payer: Medicare HMO | Attending: Hematology

## 2023-03-12 ENCOUNTER — Encounter (HOSPITAL_COMMUNITY): Payer: Self-pay

## 2023-03-12 ENCOUNTER — Ambulatory Visit (HOSPITAL_COMMUNITY)
Admission: RE | Admit: 2023-03-12 | Discharge: 2023-03-12 | Disposition: A | Discharge status: Home / Self Care | Payer: Medicare HMO | Source: Ambulatory Visit | Attending: Hematology | Admitting: Hematology

## 2023-03-12 VITALS — BP 150/85 | HR 90 | Temp 97.7°F | Resp 18

## 2023-03-12 DIAGNOSIS — C250 Malignant neoplasm of head of pancreas: Secondary | ICD-10-CM | POA: Diagnosis present

## 2023-03-12 DIAGNOSIS — C259 Malignant neoplasm of pancreas, unspecified: Secondary | ICD-10-CM

## 2023-03-12 DIAGNOSIS — D649 Anemia, unspecified: Secondary | ICD-10-CM | POA: Insufficient documentation

## 2023-03-12 DIAGNOSIS — Z95828 Presence of other vascular implants and grafts: Secondary | ICD-10-CM

## 2023-03-12 LAB — COMPREHENSIVE METABOLIC PANEL
ALT: 62 U/L — ABNORMAL HIGH (ref 0–44)
AST: 44 U/L — ABNORMAL HIGH (ref 15–41)
Albumin: 2.5 g/dL — ABNORMAL LOW (ref 3.5–5.0)
Alkaline Phosphatase: 678 U/L — ABNORMAL HIGH (ref 38–126)
Anion gap: 12 (ref 5–15)
BUN: 37 mg/dL — ABNORMAL HIGH (ref 8–23)
CO2: 20 mmol/L — ABNORMAL LOW (ref 22–32)
Calcium: 9.7 mg/dL (ref 8.9–10.3)
Chloride: 98 mmol/L (ref 98–111)
Creatinine, Ser: 1.98 mg/dL — ABNORMAL HIGH (ref 0.44–1.00)
GFR, Estimated: 24 mL/min — ABNORMAL LOW (ref >60)
Glucose, Bld: 199 mg/dL — ABNORMAL HIGH (ref 70–99)
Potassium: 4 mmol/L (ref 3.5–5.1)
Sodium: 130 mmol/L — ABNORMAL LOW (ref 135–145)
Total Bilirubin: 1.2 mg/dL (ref 0.3–1.2)
Total Protein: 7 g/dL (ref 6.5–8.1)

## 2023-03-12 LAB — CBC WITH DIFFERENTIAL/PLATELET
Abs Immature Granulocytes: 0.35 10*3/uL — ABNORMAL HIGH (ref 0.00–0.07)
Basophils Absolute: 0 10*3/uL (ref 0.0–0.1)
Basophils Relative: 0 %
Eosinophils Absolute: 0 10*3/uL (ref 0.0–0.5)
Eosinophils Relative: 0 %
HCT: 28.5 % — ABNORMAL LOW (ref 36.0–46.0)
Hemoglobin: 9.3 g/dL — ABNORMAL LOW (ref 12.0–15.0)
Immature Granulocytes: 3 %
Lymphocytes Relative: 3 %
Lymphs Abs: 0.4 10*3/uL — ABNORMAL LOW (ref 0.7–4.0)
MCH: 32.6 pg (ref 26.0–34.0)
MCHC: 32.6 g/dL (ref 30.0–36.0)
MCV: 100 fL (ref 80.0–100.0)
Monocytes Absolute: 0.3 10*3/uL (ref 0.1–1.0)
Monocytes Relative: 2 %
Neutro Abs: 12.3 10*3/uL — ABNORMAL HIGH (ref 1.7–7.7)
Neutrophils Relative %: 92 %
Platelets: 491 10*3/uL — ABNORMAL HIGH (ref 150–400)
RBC: 2.85 MIL/uL — ABNORMAL LOW (ref 3.87–5.11)
RDW: 17.3 % — ABNORMAL HIGH (ref 11.5–15.5)
WBC: 13.3 10*3/uL — ABNORMAL HIGH (ref 4.0–10.5)
nRBC: 0 % (ref 0.0–0.2)

## 2023-03-12 LAB — MAGNESIUM: Magnesium: 2.1 mg/dL (ref 1.7–2.4)

## 2023-03-12 MED ORDER — HEPARIN SOD (PORK) LOCK FLUSH 100 UNIT/ML IV SOLN
INTRAVENOUS | Status: AC
  Filled 2023-03-12: qty 5

## 2023-03-12 MED ORDER — SODIUM CHLORIDE 0.9% FLUSH
10.0000 mL | INTRAVENOUS | Status: DC | PRN
  Administered 2023-03-12: 10 mL via INTRAVENOUS

## 2023-03-12 NOTE — Progress Notes (Signed)
Patients port flushed without difficulty.  Good blood return noted with no bruising or swelling noted at site.  Patient remains accessed for CT scan.  °

## 2023-03-12 NOTE — Patient Instructions (Signed)

## 2023-03-13 LAB — CANCER ANTIGEN 19-9: CA 19-9: 5840 U/mL — ABNORMAL HIGH (ref 0–35)

## 2023-03-15 ENCOUNTER — Telehealth: Payer: Self-pay | Admitting: *Deleted

## 2023-03-15 ENCOUNTER — Ambulatory Visit: Payer: Medicare HMO

## 2023-03-15 ENCOUNTER — Other Ambulatory Visit: Payer: Self-pay | Admitting: *Deleted

## 2023-03-15 DIAGNOSIS — C259 Malignant neoplasm of pancreas, unspecified: Secondary | ICD-10-CM

## 2023-03-15 MED ORDER — DIPHENHYDRAMINE HCL 50 MG PO TABS: 50.0000 mg | ORAL_TABLET | Freq: Once | ORAL | 0 refills | Status: AC

## 2023-03-15 MED ORDER — PREDNISONE 50 MG PO TABS: ORAL_TABLET | ORAL | 0 refills | Status: DC

## 2023-03-15 NOTE — Telephone Encounter (Signed)
Received call from patient's daughter to advise that they were unable to perform CT due to creatinine level.  In addition, liver function tests were elevated.  Discussed with Dr. Ellin Saba and will bring patient in for fluids and reschedule CT AP w/wo contrast with pancreatic protocol and CT chest with contrast.  Daughter made aware.

## 2023-03-16 ENCOUNTER — Observation Stay (HOSPITAL_COMMUNITY): Payer: Medicare HMO

## 2023-03-16 ENCOUNTER — Encounter (HOSPITAL_COMMUNITY): Payer: Self-pay | Admitting: Emergency Medicine

## 2023-03-16 ENCOUNTER — Other Ambulatory Visit: Payer: Self-pay

## 2023-03-16 ENCOUNTER — Inpatient Hospital Stay (HOSPITAL_COMMUNITY)
Admission: EM | Admit: 2023-03-16 | Discharge: 2023-03-24 | DRG: 445 | Disposition: A | Discharge status: Home Health | Payer: Medicare HMO | Source: Ambulatory Visit | Attending: Internal Medicine | Admitting: Internal Medicine

## 2023-03-16 ENCOUNTER — Inpatient Hospital Stay: Payer: Medicare HMO

## 2023-03-16 VITALS — BP 123/69 | HR 80 | Temp 98.4°F | Resp 18

## 2023-03-16 DIAGNOSIS — K8309 Other cholangitis: Secondary | ICD-10-CM | POA: Diagnosis present

## 2023-03-16 DIAGNOSIS — Z634 Disappearance and death of family member: Secondary | ICD-10-CM

## 2023-03-16 DIAGNOSIS — N1831 Chronic kidney disease, stage 3a: Secondary | ICD-10-CM | POA: Diagnosis not present

## 2023-03-16 DIAGNOSIS — K831 Obstruction of bile duct: Principal | ICD-10-CM | POA: Diagnosis present

## 2023-03-16 DIAGNOSIS — K219 Gastro-esophageal reflux disease without esophagitis: Secondary | ICD-10-CM | POA: Diagnosis present

## 2023-03-16 DIAGNOSIS — Z91041 Radiographic dye allergy status: Secondary | ICD-10-CM

## 2023-03-16 DIAGNOSIS — Z515 Encounter for palliative care: Secondary | ICD-10-CM

## 2023-03-16 DIAGNOSIS — Z602 Problems related to living alone: Secondary | ICD-10-CM | POA: Diagnosis present

## 2023-03-16 DIAGNOSIS — K862 Cyst of pancreas: Secondary | ICD-10-CM | POA: Diagnosis present

## 2023-03-16 DIAGNOSIS — R54 Age-related physical debility: Secondary | ICD-10-CM | POA: Diagnosis present

## 2023-03-16 DIAGNOSIS — R748 Abnormal levels of other serum enzymes: Secondary | ICD-10-CM

## 2023-03-16 DIAGNOSIS — N183 Chronic kidney disease, stage 3 unspecified: Secondary | ICD-10-CM | POA: Diagnosis present

## 2023-03-16 DIAGNOSIS — I129 Hypertensive chronic kidney disease with stage 1 through stage 4 chronic kidney disease, or unspecified chronic kidney disease: Secondary | ICD-10-CM | POA: Diagnosis present

## 2023-03-16 DIAGNOSIS — Z79899 Other long term (current) drug therapy: Secondary | ICD-10-CM

## 2023-03-16 DIAGNOSIS — K315 Obstruction of duodenum: Secondary | ICD-10-CM | POA: Diagnosis present

## 2023-03-16 DIAGNOSIS — R5381 Other malaise: Secondary | ICD-10-CM | POA: Diagnosis present

## 2023-03-16 DIAGNOSIS — Z888 Allergy status to other drugs, medicaments and biological substances status: Secondary | ICD-10-CM

## 2023-03-16 DIAGNOSIS — G250 Essential tremor: Secondary | ICD-10-CM | POA: Diagnosis present

## 2023-03-16 DIAGNOSIS — R17 Unspecified jaundice: Secondary | ICD-10-CM | POA: Diagnosis present

## 2023-03-16 DIAGNOSIS — Z9889 Other specified postprocedural states: Secondary | ICD-10-CM

## 2023-03-16 DIAGNOSIS — Z87440 Personal history of urinary (tract) infections: Secondary | ICD-10-CM

## 2023-03-16 DIAGNOSIS — E860 Dehydration: Secondary | ICD-10-CM | POA: Diagnosis present

## 2023-03-16 DIAGNOSIS — D72829 Elevated white blood cell count, unspecified: Secondary | ICD-10-CM | POA: Diagnosis present

## 2023-03-16 DIAGNOSIS — N179 Acute kidney failure, unspecified: Secondary | ICD-10-CM | POA: Diagnosis present

## 2023-03-16 DIAGNOSIS — Z809 Family history of malignant neoplasm, unspecified: Secondary | ICD-10-CM

## 2023-03-16 DIAGNOSIS — Z7982 Long term (current) use of aspirin: Secondary | ICD-10-CM

## 2023-03-16 DIAGNOSIS — C259 Malignant neoplasm of pancreas, unspecified: Secondary | ICD-10-CM | POA: Diagnosis not present

## 2023-03-16 DIAGNOSIS — Z923 Personal history of irradiation: Secondary | ICD-10-CM

## 2023-03-16 DIAGNOSIS — Z950 Presence of cardiac pacemaker: Secondary | ICD-10-CM

## 2023-03-16 DIAGNOSIS — D509 Iron deficiency anemia, unspecified: Secondary | ICD-10-CM | POA: Diagnosis present

## 2023-03-16 DIAGNOSIS — Z803 Family history of malignant neoplasm of breast: Secondary | ICD-10-CM

## 2023-03-16 DIAGNOSIS — E782 Mixed hyperlipidemia: Secondary | ICD-10-CM | POA: Diagnosis present

## 2023-03-16 DIAGNOSIS — Z9221 Personal history of antineoplastic chemotherapy: Secondary | ICD-10-CM

## 2023-03-16 DIAGNOSIS — I10 Essential (primary) hypertension: Secondary | ICD-10-CM | POA: Diagnosis present

## 2023-03-16 DIAGNOSIS — Z66 Do not resuscitate: Secondary | ICD-10-CM | POA: Diagnosis not present

## 2023-03-16 DIAGNOSIS — Z808 Family history of malignant neoplasm of other organs or systems: Secondary | ICD-10-CM

## 2023-03-16 DIAGNOSIS — R160 Hepatomegaly, not elsewhere classified: Secondary | ICD-10-CM | POA: Diagnosis not present

## 2023-03-16 DIAGNOSIS — E871 Hypo-osmolality and hyponatremia: Secondary | ICD-10-CM | POA: Diagnosis present

## 2023-03-16 DIAGNOSIS — Z95828 Presence of other vascular implants and grafts: Secondary | ICD-10-CM

## 2023-03-16 LAB — PROTIME-INR
INR: 1.3 — ABNORMAL HIGH (ref 0.8–1.2)
Prothrombin Time: 16.1 s — ABNORMAL HIGH (ref 11.4–15.2)

## 2023-03-16 LAB — URINALYSIS, ROUTINE W REFLEX MICROSCOPIC
Bilirubin Urine: NEGATIVE
Glucose, UA: NEGATIVE mg/dL
Hgb urine dipstick: NEGATIVE
Ketones, ur: NEGATIVE mg/dL
Nitrite: NEGATIVE
Protein, ur: 30 mg/dL — AB
Specific Gravity, Urine: 1.014 (ref 1.005–1.030)
pH: 5 (ref 5.0–8.0)

## 2023-03-16 LAB — CBC WITH DIFFERENTIAL/PLATELET
Abs Immature Granulocytes: 0.83 10*3/uL — ABNORMAL HIGH (ref 0.00–0.07)
Basophils Absolute: 0.1 10*3/uL (ref 0.0–0.1)
Basophils Relative: 1 %
Eosinophils Absolute: 0 10*3/uL (ref 0.0–0.5)
Eosinophils Relative: 0 %
HCT: 26.1 % — ABNORMAL LOW (ref 36.0–46.0)
Hemoglobin: 8.5 g/dL — ABNORMAL LOW (ref 12.0–15.0)
Immature Granulocytes: 5 %
Lymphocytes Relative: 2 %
Lymphs Abs: 0.4 10*3/uL — ABNORMAL LOW (ref 0.7–4.0)
MCH: 32.7 pg (ref 26.0–34.0)
MCHC: 32.6 g/dL (ref 30.0–36.0)
MCV: 100.4 fL — ABNORMAL HIGH (ref 80.0–100.0)
Monocytes Absolute: 1.2 10*3/uL — ABNORMAL HIGH (ref 0.1–1.0)
Monocytes Relative: 7 %
Neutro Abs: 14.4 10*3/uL — ABNORMAL HIGH (ref 1.7–7.7)
Neutrophils Relative %: 85 %
Platelets: 385 10*3/uL (ref 150–400)
RBC: 2.6 MIL/uL — ABNORMAL LOW (ref 3.87–5.11)
RDW: 17.9 % — ABNORMAL HIGH (ref 11.5–15.5)
WBC: 16.9 10*3/uL — ABNORMAL HIGH (ref 4.0–10.5)
nRBC: 0 % (ref 0.0–0.2)

## 2023-03-16 LAB — COMPREHENSIVE METABOLIC PANEL
ALT: 63 U/L — ABNORMAL HIGH (ref 0–44)
AST: 40 U/L (ref 15–41)
Albumin: 1.9 g/dL — ABNORMAL LOW (ref 3.5–5.0)
Alkaline Phosphatase: 609 U/L — ABNORMAL HIGH (ref 38–126)
Anion gap: 10 (ref 5–15)
BUN: 33 mg/dL — ABNORMAL HIGH (ref 8–23)
CO2: 18 mmol/L — ABNORMAL LOW (ref 22–32)
Calcium: 9.1 mg/dL (ref 8.9–10.3)
Chloride: 103 mmol/L (ref 98–111)
Creatinine, Ser: 1.92 mg/dL — ABNORMAL HIGH (ref 0.44–1.00)
GFR, Estimated: 25 mL/min — ABNORMAL LOW (ref >60)
Glucose, Bld: 130 mg/dL — ABNORMAL HIGH (ref 70–99)
Potassium: 4 mmol/L (ref 3.5–5.1)
Sodium: 131 mmol/L — ABNORMAL LOW (ref 135–145)
Total Bilirubin: 4.6 mg/dL — ABNORMAL HIGH (ref 0.3–1.2)
Total Protein: 6.4 g/dL — ABNORMAL LOW (ref 6.5–8.1)

## 2023-03-16 LAB — HEPATIC FUNCTION PANEL
ALT: 64 U/L — ABNORMAL HIGH (ref 0–44)
AST: 41 U/L (ref 15–41)
Albumin: 2 g/dL — ABNORMAL LOW (ref 3.5–5.0)
Alkaline Phosphatase: 570 U/L — ABNORMAL HIGH (ref 38–126)
Bilirubin, Direct: 3.3 mg/dL — ABNORMAL HIGH (ref 0.0–0.2)
Indirect Bilirubin: 1.1 mg/dL — ABNORMAL HIGH (ref 0.3–0.9)
Total Bilirubin: 4.4 mg/dL — ABNORMAL HIGH (ref 0.3–1.2)
Total Protein: 6.1 g/dL — ABNORMAL LOW (ref 6.5–8.1)

## 2023-03-16 LAB — IRON AND TIBC
Iron: 37 ug/dL (ref 28–170)
Saturation Ratios: 18 % (ref 10.4–31.8)
TIBC: 203 ug/dL — ABNORMAL LOW (ref 250–450)
UIBC: 166 ug/dL

## 2023-03-16 LAB — RETICULOCYTES
Immature Retic Fract: 16.7 % — ABNORMAL HIGH (ref 2.3–15.9)
RBC.: 2.58 MIL/uL — ABNORMAL LOW (ref 3.87–5.11)
Retic Count, Absolute: 68.9 10*3/uL (ref 19.0–186.0)
Retic Ct Pct: 2.7 % (ref 0.4–3.1)

## 2023-03-16 LAB — OSMOLALITY: Osmolality: 295 mosm/kg (ref 275–295)

## 2023-03-16 LAB — PROCALCITONIN: Procalcitonin: 5.96 ng/mL

## 2023-03-16 LAB — PHOSPHORUS: Phosphorus: 2.3 mg/dL — ABNORMAL LOW (ref 2.5–4.6)

## 2023-03-16 LAB — TSH: TSH: 0.854 u[IU]/mL (ref 0.350–4.500)

## 2023-03-16 LAB — LIPASE, BLOOD: Lipase: 26 U/L (ref 11–51)

## 2023-03-16 LAB — FERRITIN: Ferritin: 702 ng/mL — ABNORMAL HIGH (ref 11–307)

## 2023-03-16 LAB — MAGNESIUM: Magnesium: 2.1 mg/dL (ref 1.7–2.4)

## 2023-03-16 LAB — CK: Total CK: 92 U/L (ref 38–234)

## 2023-03-16 MED ORDER — SODIUM CHLORIDE 0.9 % IV SOLN: Freq: Once | INTRAVENOUS | Status: AC

## 2023-03-16 MED ORDER — SODIUM CHLORIDE 0.9 % IV SOLN: INTRAVENOUS | Status: AC

## 2023-03-16 MED ORDER — INFLUENZA VAC A&B SURF ANT ADJ 0.5 ML IM SUSY
0.5000 mL | PREFILLED_SYRINGE | INTRAMUSCULAR | Status: DC
  Filled 2023-03-16: qty 0.5

## 2023-03-16 MED ORDER — SODIUM CHLORIDE 0.9% FLUSH
10.0000 mL | INTRAVENOUS | Status: DC | PRN
  Administered 2023-03-16: 10 mL via INTRAVENOUS

## 2023-03-16 MED ORDER — ACETAMINOPHEN 650 MG RE SUPP: 650.0000 mg | Freq: Four times a day (QID) | RECTAL | Status: DC | PRN

## 2023-03-16 MED ORDER — HYDROCODONE-ACETAMINOPHEN 5-325 MG PO TABS: 1.0000 | ORAL_TABLET | ORAL | Status: DC | PRN

## 2023-03-16 MED ORDER — ONDANSETRON HCL 4 MG PO TABS: 4.0000 mg | ORAL_TABLET | Freq: Four times a day (QID) | ORAL | Status: DC | PRN

## 2023-03-16 MED ORDER — AMLODIPINE BESYLATE 5 MG PO TABS
5.0000 mg | ORAL_TABLET | Freq: Every day | ORAL | Status: DC
  Administered 2023-03-17 – 2023-03-24 (×8): 5 mg via ORAL
  Filled 2023-03-16 (×8): qty 1

## 2023-03-16 MED ORDER — ENSURE ENLIVE PO LIQD
237.0000 mL | Freq: Two times a day (BID) | ORAL | Status: DC
  Administered 2023-03-17 – 2023-03-24 (×9): 237 mL via ORAL

## 2023-03-16 MED ORDER — NEBIVOLOL HCL 2.5 MG PO TABS
2.5000 mg | ORAL_TABLET | Freq: Every day | ORAL | Status: DC
  Administered 2023-03-17 – 2023-03-24 (×8): 2.5 mg via ORAL
  Filled 2023-03-16 (×8): qty 1

## 2023-03-16 MED ORDER — ACETAMINOPHEN 325 MG PO TABS
650.0000 mg | ORAL_TABLET | Freq: Four times a day (QID) | ORAL | Status: DC | PRN
  Administered 2023-03-17: 650 mg via ORAL
  Filled 2023-03-16: qty 2

## 2023-03-16 MED ORDER — ONDANSETRON HCL 4 MG/2ML IJ SOLN
4.0000 mg | Freq: Four times a day (QID) | INTRAMUSCULAR | Status: DC | PRN
  Administered 2023-03-19: 4 mg via INTRAVENOUS
  Filled 2023-03-16 (×2): qty 2

## 2023-03-16 MED ORDER — GABAPENTIN 100 MG PO CAPS
100.0000 mg | ORAL_CAPSULE | Freq: Two times a day (BID) | ORAL | Status: DC
  Administered 2023-03-16 – 2023-03-24 (×16): 100 mg via ORAL
  Filled 2023-03-16 (×16): qty 1

## 2023-03-16 MED ORDER — SODIUM CHLORIDE 0.9 % IV SOLN: INTRAVENOUS | Status: DC

## 2023-03-16 MED ORDER — HEPARIN SOD (PORK) LOCK FLUSH 100 UNIT/ML IV SOLN
500.0000 [IU] | Freq: Once | INTRAVENOUS | Status: AC
  Administered 2023-03-16: 500 [IU] via INTRAVENOUS

## 2023-03-16 MED ORDER — ALPRAZOLAM 0.5 MG PO TABS
0.5000 mg | ORAL_TABLET | Freq: Once | ORAL | Status: AC | PRN
  Administered 2023-03-18: 0.5 mg via ORAL
  Filled 2023-03-16: qty 1

## 2023-03-16 MED ORDER — SODIUM CHLORIDE 0.9 % IV BOLUS
1000.0000 mL | Freq: Once | INTRAVENOUS | Status: AC
  Administered 2023-03-16: 1000 mL via INTRAVENOUS

## 2023-03-16 MED ORDER — PANTOPRAZOLE SODIUM 40 MG PO TBEC
40.0000 mg | DELAYED_RELEASE_TABLET | Freq: Every day | ORAL | Status: DC
  Administered 2023-03-17 – 2023-03-24 (×8): 40 mg via ORAL
  Filled 2023-03-16 (×8): qty 1

## 2023-03-16 NOTE — Progress Notes (Signed)
1335:  RN noticed that patient's face and sclera looked jaundiced.  Patient's family member agreed.  MD was made aware.  Patient is present in the office today for IVF due to elevated creatinine and liver functions at CT scan appointment on 03/12/23.  We will draw hepatic function panel today per Dr. Ellin Saba.  Patient aware.    1430:  Labs resulted and MD at bedside.  Results discussed with patient.  We will attempt to set up STAT imaging.  If unsuccessful, we will transport patient to the ED.  Patient aware.     1540:  Unable to get STAT imaging today.  We will send patient to [facility] ED per Dr. Ellin Saba for imaging and evaluation.  Patient left via wheelchair with grandson with instructions to head to [facility] ED.  Patient in stable condition at discharge.

## 2023-03-16 NOTE — ED Notes (Signed)
PT endorses claustrophobia and will need medication prior to MRI.

## 2023-03-16 NOTE — ED Triage Notes (Signed)
Patient arrives in wheelchair with family member stating patient was at [facility] this morning for IV hydration in the oncology area. States they noticed she appears jaundiced and did labs then sent her here for an MRI. Patient denies any pain just c/o weakness ongoing for a while.

## 2023-03-16 NOTE — Assessment & Plan Note (Signed)
Chronic-stable.

## 2023-03-16 NOTE — Assessment & Plan Note (Addendum)
Possibly in the setting of dehydration and also for completion chest x-ray and TSH Continue to cycle BMEt

## 2023-03-16 NOTE — Assessment & Plan Note (Signed)
Obtain anemia panel transfuse for hemoglobin below 7

## 2023-03-16 NOTE — ED Notes (Signed)
ED TO INPATIENT HANDOFF REPORT  ED Nurse Name and Phone #: Amil Amen [phone]  S Name/Age/Gender Meagan [name] 88 y.o. female Room/Bed: 037C/037C  Code Status   Code Status: Full Code  Home/SNF/Other Home Patient oriented to: self, place, time, and situation Is this baseline? Yes   Triage Complete: Triage complete  Chief Complaint Biliary obstruction [K83.1]  Triage Note Patient arrives in wheelchair with family member stating patient was at [facility] this morning for IV hydration in the oncology area. States they noticed she appears jaundiced and did labs then sent her here for an MRI. Patient denies any pain just c/o weakness ongoing for a while.    Allergies Allergies  Allergen Reactions   Ivp Dye [Iodinated Contrast Media] Hives   Lisinopril Cough    Level of Care/Admitting Diagnosis ED Disposition     ED Disposition  Admit   Condition  --   Comment  Hospital Area: [facility] [[id number]]  Level of Care: Telemetry Medical [104]  May place patient in observation at [facility] or [facility] if equivalent level of care is available:: No  Covid Evaluation: Asymptomatic - no recent exposure (last 10 days) testing not required  Diagnosis: Biliary obstruction [366440]  Admitting Physician: Therisa Doyne [[id number]]  Attending Physician: Therisa Doyne [[id number]]          B Medical/Surgery History Past Medical History:  Diagnosis Date   Essential tremor    GERD (gastroesophageal reflux disease)    High cholesterol    Hypertension    PONV (postoperative nausea and vomiting)    Port-A-Cath in place 01/27/2022   Vertigo    Past Surgical History:  Procedure Laterality Date   BILIARY BRUSHING  09/10/2021   Procedure: BILIARY BRUSHING;  Surgeon: Hilarie Fredrickson, MD;  Location: [facility] ENDOSCOPY;  Service: Gastroenterology;;   BILIARY BRUSHING  10/29/2021   Procedure: BILIARY BRUSHING;  Surgeon: Lemar Lofty., MD;  Location: [facility] ENDOSCOPY;   Service: Gastroenterology;;   BILIARY DILATION  10/29/2021   Procedure: BILIARY DILATION;  Surgeon: Lemar Lofty., MD;  Location: [facility] ENDOSCOPY;  Service: Gastroenterology;;   BILIARY STENT PLACEMENT  09/10/2021   Procedure: BILIARY STENT PLACEMENT;  Surgeon: Hilarie Fredrickson, MD;  Location: [facility] ENDOSCOPY;  Service: Gastroenterology;;   BILIARY STENT PLACEMENT N/A 10/03/2021   Procedure: BILIARY STENT PLACEMENT;  Surgeon: Iva Boop, MD;  Location: [facility] ENDOSCOPY;  Service: Gastroenterology;  Laterality: N/A;   BILIARY STENT PLACEMENT N/A 10/29/2021   Procedure: BILIARY STENT PLACEMENT;  Surgeon: Meridee Score Netty Starring., MD;  Location: [facility] ENDOSCOPY;  Service: Gastroenterology;  Laterality: N/A;   BILIARY STENT PLACEMENT N/A 05/08/2022   Procedure: BILIARY STENT PLACEMENT;  Surgeon: Iva Boop, MD;  Location: [facility] ENDOSCOPY;  Service: Gastroenterology;  Laterality: N/A;   BIOPSY  10/29/2021   Procedure: BIOPSY;  Surgeon: Meridee Score Netty Starring., MD;  Location: [facility] ENDOSCOPY;  Service: Gastroenterology;;   ENDOSCOPIC RETROGRADE CHOLANGIOPANCREATOGRAPHY (ERCP) WITH PROPOFOL N/A 10/29/2021   Procedure: ENDOSCOPIC RETROGRADE CHOLANGIOPANCREATOGRAPHY (ERCP) WITH PROPOFOL;  Surgeon: Lemar Lofty., MD;  Location: [facility] ENDOSCOPY;  Service: Gastroenterology;  Laterality: N/A;   ERCP N/A 09/10/2021   Procedure: ENDOSCOPIC RETROGRADE CHOLANGIOPANCREATOGRAPHY (ERCP);  Surgeon: Hilarie Fredrickson, MD;  Location: [facility] ENDOSCOPY;  Service: Gastroenterology;  Laterality: N/A;   ERCP N/A 10/03/2021   Procedure: ENDOSCOPIC RETROGRADE CHOLANGIOPANCREATOGRAPHY (ERCP);  Surgeon: Iva Boop, MD;  Location: [facility] ENDOSCOPY;  Service: Gastroenterology;  Laterality: N/A;   ERCP N/A 05/08/2022   Procedure: ENDOSCOPIC RETROGRADE CHOLANGIOPANCREATOGRAPHY (ERCP);  Surgeon: Iva Boop, MD;  Location: [facility] ENDOSCOPY;  Service: Gastroenterology;  Laterality: N/A;   ESOPHAGOGASTRODUODENOSCOPY (EGD) WITH PROPOFOL N/A  10/29/2021   Procedure: ESOPHAGOGASTRODUODENOSCOPY (EGD) WITH PROPOFOL;  Surgeon: Meridee Score Netty Starring., MD;  Location: [facility] ENDOSCOPY;  Service: Gastroenterology;  Laterality: N/A;   EUS N/A 10/29/2021   Procedure: UPPER ENDOSCOPIC ULTRASOUND (EUS) RADIAL;  Surgeon: Lemar Lofty., MD;  Location: [facility] ENDOSCOPY;  Service: Gastroenterology;  Laterality: N/A;   FINE NEEDLE ASPIRATION  10/29/2021   Procedure: FINE NEEDLE ASPIRATION (FNA) LINEAR;  Surgeon: Lemar Lofty., MD;  Location: [facility] ENDOSCOPY;  Service: Gastroenterology;;   IR IMAGING GUIDED PORT INSERTION  01/28/2022   PACEMAKER IMPLANT     REMOVAL OF STONES  10/29/2021   Procedure: REMOVAL OF SLUDGE;  Surgeon: Lemar Lofty., MD;  Location: [facility] ENDOSCOPY;  Service: Gastroenterology;;   Dennison Mascot  09/10/2021   Procedure: Dennison Mascot;  Surgeon: Hilarie Fredrickson, MD;  Location: [facility] ENDOSCOPY;  Service: Gastroenterology;;   Burman Freestone CHOLANGIOSCOPY N/A 10/29/2021   Procedure: ZOXWRUEA CHOLANGIOSCOPY;  Surgeon: Lemar Lofty., MD;  Location: [facility] ENDOSCOPY;  Service: Gastroenterology;  Laterality: N/A;   STENT REMOVAL  10/03/2021   Procedure: STENT REMOVAL;  Surgeon: Iva Boop, MD;  Location: [facility] ENDOSCOPY;  Service: Gastroenterology;;     A IV Location/Drains/Wounds Patient Lines/Drains/Airways Status     Active Line/Drains/Airways     Name Placement date Placement time Site Days   Implanted Port 01/28/22 Right Chest 01/28/22  1155  Chest  412   Peripheral IV 03/16/23 20 G Right Forearm 03/16/23  1808  Forearm  less than 1   GI Stent 10/29/21  1238  --  503   GI Stent 05/08/22  1624  --  312            Intake/Output Last 24 hours No intake or output data in the 24 hours ending 03/16/23 1949  Labs/Imaging Results for orders placed or performed during the hospital encounter of 03/16/23 (from the past 48 hour(s))  Urinalysis, Routine w reflex microscopic -Urine, Clean Catch     Status: Abnormal    Collection Time: 03/16/23  5:03 PM  Result Value Ref Range   Color, Urine AMBER (A) YELLOW    Comment: BIOCHEMICALS MAY BE AFFECTED BY COLOR   APPearance HAZY (A) CLEAR   Specific Gravity, Urine 1.014 1.005 - 1.030   pH 5.0 5.0 - 8.0   Glucose, UA NEGATIVE NEGATIVE mg/dL   Hgb urine dipstick NEGATIVE NEGATIVE   Bilirubin Urine NEGATIVE NEGATIVE   Ketones, ur NEGATIVE NEGATIVE mg/dL   Protein, ur 30 (A) NEGATIVE mg/dL   Nitrite NEGATIVE NEGATIVE   Leukocytes,Ua SMALL (A) NEGATIVE   RBC / HPF 0-5 0 - 5 RBC/hpf   WBC, UA 11-20 0 - 5 WBC/hpf   Bacteria, UA RARE (A) NONE SEEN   Squamous Epithelial / HPF 0-5 0 - 5 /HPF    Comment: Performed at [facility], [address], [place], [place] [address]  CBC with Differential     Status: Abnormal   Collection Time: 03/16/23  5:04 PM  Result Value Ref Range   WBC 16.9 (H) 4.0 - 10.5 K/uL   RBC 2.60 (L) 3.87 - 5.11 MIL/uL   Hemoglobin 8.5 (L) 12.0 - 15.0 g/dL   HCT 11.9 (L) 14.7 - 82.9 %   MCV 100.4 (H) 80.0 - 100.0 fL   MCH 32.7 26.0 - 34.0 pg   MCHC 32.6 30.0 - 36.0 g/dL  RDW 17.9 (H) 11.5 - 15.5 %   Platelets 385 150 - 400 K/uL   nRBC 0.0 0.0 - 0.2 %   Neutrophils Relative % 85 %   Neutro Abs 14.4 (H) 1.7 - 7.7 K/uL   Lymphocytes Relative 2 %   Lymphs Abs 0.4 (L) 0.7 - 4.0 K/uL   Monocytes Relative 7 %   Monocytes Absolute 1.2 (H) 0.1 - 1.0 K/uL   Eosinophils Relative 0 %   Eosinophils Absolute 0.0 0.0 - 0.5 K/uL   Basophils Relative 1 %   Basophils Absolute 0.1 0.0 - 0.1 K/uL   Immature Granulocytes 5 %   Abs Immature Granulocytes 0.83 (H) 0.00 - 0.07 K/uL    Comment: Performed at [facility], [address] [place], [place] [address]  Comprehensive metabolic panel     Status: Abnormal   Collection Time: 03/16/23  5:04 PM  Result Value Ref Range   Sodium 131 (L) 135 - 145 mmol/L   Potassium 4.0 3.5 - 5.1 mmol/L   Chloride 103 98 - 111 mmol/L   CO2 18 (L) 22 - 32 mmol/L   Glucose, Bld 130 (H) 70 - 99 mg/dL     Comment: Glucose reference range applies only to samples taken after fasting for at least 8 hours.   BUN 33 (H) 8 - 23 mg/dL   Creatinine, Ser 9.14 (H) 0.44 - 1.00 mg/dL   Calcium 9.1 8.9 - 78.2 mg/dL   Total Protein 6.4 (L) 6.5 - 8.1 g/dL   Albumin 1.9 (L) 3.5 - 5.0 g/dL   AST 40 15 - 41 U/L   ALT 63 (H) 0 - 44 U/L   Alkaline Phosphatase 609 (H) 38 - 126 U/L   Total Bilirubin 4.6 (H) 0.3 - 1.2 mg/dL   GFR, Estimated 25 (L) >60 mL/min    Comment: (NOTE) Calculated using the CKD-EPI Creatinine Equation (2021)    Anion gap 10 5 - 15    Comment: Performed at [facility], [address] [place], [place] [address]  Lipase, blood     Status: None   Collection Time: 03/16/23  5:04 PM  Result Value Ref Range   Lipase 26 11 - 51 U/L    Comment: Performed at [facility], [address] [place], [place] [address]  Protime-INR     Status: Abnormal   Collection Time: 03/16/23  5:04 PM  Result Value Ref Range   Prothrombin Time 16.1 (H) 11.4 - 15.2 seconds   INR 1.3 (H) 0.8 - 1.2    Comment: (NOTE) INR goal varies based on device and disease states. Performed at [facility], [address], [place], [place] [address]    No results found.  Pending Labs Unresulted Labs (From admission, onward)     Start     Ordered   03/17/23 0500  Vitamin B12  (Anemia Panel (PNL))  Tomorrow morning,   R        03/16/23 1935   03/17/23 0500  Folate  (Anemia Panel (PNL))  Tomorrow morning,   R        03/16/23 1935   03/17/23 0500  Prealbumin  Tomorrow morning,   R        03/16/23 1935   03/16/23 1936  Iron and TIBC  (Anemia Panel (PNL))  Add-on,   AD        03/16/23 1935   03/16/23 1936  Ferritin  (Anemia Panel (PNL))  Add-on,  AD        03/16/23 1935   03/16/23 1936  Reticulocytes  (Anemia Panel (PNL))  Add-on,   AD        03/16/23 1935   03/16/23 1936  Procalcitonin  Add-on,   AD       References:    Procalcitonin Lower Respiratory Tract Infection AND Sepsis  Procalcitonin Algorithm   03/16/23 1935   03/16/23 1936  TSH  Add-on,   AD        03/16/23 1935   03/16/23 1936  Sodium, urine, random  Once,   R        03/16/23 1935   03/16/23 1936  Osmolality  Add-on,   AD        03/16/23 1935   03/16/23 1936  Osmolality, urine  Once,   R        03/16/23 1935   03/16/23 1936  Magnesium  Add-on,   AD        03/16/23 1935   03/16/23 1936  Phosphorus  Add-on,   AD        03/16/23 1935   03/16/23 1936  Creatinine, urine, random  Once,   R        03/16/23 1935   03/16/23 1936  CK  Add-on,   AD        03/16/23 1935   Signed and Held  Magnesium  Tomorrow morning,   R        Signed and Held   Medical illustrator and Held  Phosphorus  Tomorrow morning,   R        Signed and Held   Signed and Held  CBC  Tomorrow morning,   R       Question:  Release to patient  Answer:  Immediate   Signed and Held   Signed and Held  Comprehensive metabolic panel  Tomorrow morning,   R        Signed and Held            Vitals/Pain Today's Vitals   03/16/23 1815 03/16/23 1829 03/16/23 1900 03/16/23 1945  BP: (!) 144/80  (!) 145/69 (!) 141/80  Pulse: 76  80 79  Resp: 15  16 16   Temp:    97.9 F (36.6 C)  TempSrc:    Oral  SpO2: 100%  100% 100%  Weight:    58.1 kg  Height:    5\' 6"  (1.676 m)  PainSc:  0-No pain  0-No pain    Isolation Precautions No active isolations  Medications Medications  sodium chloride 0.9 % bolus 1,000 mL (0 mLs Intravenous Stopped 03/16/23 1948)    Mobility walks with person assist     Focused Assessments GI-WEAKNESS   R Recommendations: See Admitting Provider Note  Report given to:   Additional Notes: For MRI and MRCP in am, NPO from midnight.

## 2023-03-16 NOTE — Assessment & Plan Note (Signed)
Hx of pacemaker for bradycardia

## 2023-03-16 NOTE — Assessment & Plan Note (Addendum)
[facility] is aware MRCP ordered will have to get in AM bc pt has pacemaker No fver /chills to sudgest infection at this time

## 2023-03-16 NOTE — Assessment & Plan Note (Signed)
Would benefit from PT/OT eval prior to dc

## 2023-03-16 NOTE — Assessment & Plan Note (Signed)
Continue Bystolic 2.5 mg in the morning and Norvasc 5 mg a day

## 2023-03-16 NOTE — Assessment & Plan Note (Signed)
Chronic/recurrent

## 2023-03-16 NOTE — Progress Notes (Signed)
Patient presents today for 1 liter of normal saline over 2 hours. Vital signs stable. Patient has no complaints of nausea, dizziness, or pain. Patient complains of fatigue not relieved by rest.

## 2023-03-16 NOTE — ED Notes (Signed)
MRI called and spoke with this Clinical research associate, due to PT's pacemaker model, PT's MRI will be in the AM.

## 2023-03-16 NOTE — ED Provider Notes (Signed)
[place] EMERGENCY DEPARTMENT AT [facility] Provider Note   CSN: [id number] Arrival date & time: 03/16/23  1643     History  Chief Complaint  Patient presents with   Abnormal Lab    Meagan [name] is a 88 y.o. female.  With a past history of pancreatic cancer status postradiation therapy and biliary stent obstruction.  Recently completed a round of radiation therapy last month.  Was seen for routine outpatient visit at the cancer center at [facility] and noted to be jaundiced with scleral icterus.  Presentation and laboratory workup were concerning for recurrence of biliary obstruction and she was sent to the ED here at [facility] for MR CP and admission.  Patient reports generalized weakness but no abdominal pain, change in bowel habits, nausea, vomiting or decreased p.o. intake.  Denies fevers and chills.   Abnormal Lab      Home Medications Prior to Admission medications   Medication Sig Start Date End Date Taking? Authorizing Provider  amLODipine (NORVASC) 5 MG tablet 1 tab(s) orally once a day for 90 days 09/01/22   [provider]  aspirin EC 81 MG tablet Take 81 mg by mouth daily. Swallow whole.    [provider]  capecitabine (XELODA) 500 MG tablet Take 2 tablets (1,000 mg total) by mouth 2 (two) times daily after a meal. Take Monday- Friday. Take only on days of radiation. 12/15/22   [name], MD  Cholecalciferol (VITAMIN D3) 50 MCG (2000 UT) TABS Take 2,000 Units by mouth daily after supper.    [provider]  Coenzyme Q10 (CO Q-10) 100 MG CAPS Take 100 mg by mouth every evening.    [provider]  diphenhydrAMINE (BENADRYL) 50 MG tablet Take 1 tablet (50 mg total) by mouth once for 1 dose. Take 1 hour prior to scan 03/15/23 03/15/23  [name], MD  famotidine (PEPCID) 40 MG tablet Take 40 mg by mouth every evening. 10/02/21   [provider]  gabapentin (NEURONTIN) 100 MG capsule Take 100  mg by mouth 2 (two) times daily.    [provider]  meclizine (ANTIVERT) 25 MG tablet Take 25 mg by mouth daily as needed for dizziness.    [provider]  megestrol (MEGACE) 400 MG/10ML suspension Take 10 mLs (400 mg total) by mouth 2 (two) times daily. 11/02/22   [name], MD  methylPREDNISolone (MEDROL DOSEPAK) 4 MG TBPK tablet Take as directed 02/08/23   [name], MD  Misc Natural Products (JOINT SUPPORT) CAPS Take 1 capsule by mouth daily with breakfast.    [provider]  Multiple Vitamin (MULTIVITAMIN) tablet Take 1 tablet by mouth daily with breakfast.    [provider]  Multiple Vitamins-Minerals (OCUVITE EYE HEALTH FORMULA) CAPS Take 1 capsule by mouth daily.    [provider]  nebivolol (BYSTOLIC) 5 MG tablet Take 2.5 mg by mouth in the morning.    [provider]  nitrofurantoin, macrocrystal-monohydrate, (MACROBID) 100 MG capsule Take 1 capsule (100 mg total) by mouth 2 (two) times daily. 02/08/23   [name], MD  omeprazole (PRILOSEC) 40 MG capsule Take 40 mg by mouth daily before breakfast.    [provider]  polyethylene glycol (MIRALAX) 17 g packet Take 17 g by mouth daily as needed. Patient taking differently: Take 17 g by mouth daily as needed for moderate constipation. 09/11/21   Burnadette Pop, MD  Polyvinyl Alcohol-Povidone (REFRESH OP) Place 1 drop into both eyes daily as  needed (tired eyes).    [provider]  predniSONE (DELTASONE) 50 MG tablet Take 13 hours, 7 hours and 1 hour prior to CT scan 03/15/23   [name], MD  primidone (MYSOLINE) 50 MG tablet Take 25 mg by mouth daily as needed (for tremors).    [provider]  pyridOXINE (VITAMIN B-6) 100 MG tablet Take 50 mg by mouth in the morning.    [provider]  tiZANidine (ZANAFLEX) 2 MG tablet Take 1 mg by mouth daily as needed for muscle spasms.    [provider]       Allergies    Ivp dye [iodinated contrast media] and Lisinopril    Review of Systems   Review of Systems  Physical Exam Updated Vital Signs BP (!) 144/80   Pulse 76   Temp 98.3 F (36.8 C)   Resp 15   Ht 5\' 6"  (1.676 m)   Wt 58.1 kg   SpO2 100%   BMI 20.66 kg/m  Physical Exam Vitals and nursing note reviewed.  HENT:     Head: Normocephalic and atraumatic.  Eyes:     General: Scleral icterus present.     Pupils: Pupils are equal, round, and reactive to light.  Cardiovascular:     Rate and Rhythm: Normal rate and regular rhythm.  Pulmonary:     Effort: Pulmonary effort is normal.     Breath sounds: Normal breath sounds.  Abdominal:     Palpations: Abdomen is soft.     Tenderness: There is no abdominal tenderness.  Skin:    General: Skin is warm and dry.     Coloration: Skin is jaundiced.  Neurological:     Mental Status: She is alert.  Psychiatric:        Mood and Affect: Mood normal.     ED Results / Procedures / Treatments   Labs (all labs ordered are listed, but only abnormal results are displayed) Labs Reviewed  CBC WITH DIFFERENTIAL/PLATELET - Abnormal; Notable for the following components:      Result Value   WBC 16.9 (*)    RBC 2.60 (*)    Hemoglobin 8.5 (*)    HCT 26.1 (*)    MCV 100.4 (*)    RDW 17.9 (*)    Neutro Abs 14.4 (*)    Lymphs Abs 0.4 (*)    Monocytes Absolute 1.2 (*)    Abs Immature Granulocytes 0.83 (*)    All other components within normal limits  COMPREHENSIVE METABOLIC PANEL - Abnormal; Notable for the following components:   Sodium 131 (*)    CO2 18 (*)    Glucose, Bld 130 (*)    BUN 33 (*)    Creatinine, Ser 1.92 (*)    Total Protein 6.4 (*)    Albumin 1.9 (*)    ALT 63 (*)    Alkaline Phosphatase 609 (*)    Total Bilirubin 4.6 (*)    GFR, Estimated 25 (*)    All other components within normal limits  PROTIME-INR - Abnormal; Notable for the following components:   Prothrombin Time 16.1 (*)    INR 1.3 (*)    All  other components within normal limits  URINALYSIS, ROUTINE W REFLEX MICROSCOPIC - Abnormal; Notable for the following components:   Color, Urine AMBER (*)    APPearance HAZY (*)    Protein, ur 30 (*)    Leukocytes,Ua SMALL (*)    Bacteria, UA RARE (*)    All other  components within normal limits  LIPASE, BLOOD    EKG None  Radiology No results found.  Procedures Procedures    Medications Ordered in ED Medications  sodium chloride 0.9 % bolus 1,000 mL (1,000 mLs Intravenous New Bag/Given 03/16/23 1819)    ED Course/ Medical Decision Making/ A&P                                 Medical Decision Making 88 year old female with history as above sent in from [facility] given concern for biliary obstruction in the setting of pancreatic adenocarcinoma.  Multiple prior episodes of biliary obstruction.  Is comfortable appearing and hemodynamically stable here.  Discussed with gastroenterology on-call who agrees with plan for MRCP and admission.  Stat MRCP without contrast ordered.  Patient will be admitted.  Laboratory workup was completed earlier today           Final Clinical Impression(s) / ED Diagnoses Final diagnoses:  Biliary obstruction  Pancreatic adenocarcinoma [facility])    Rx / DC Orders ED Discharge Orders     None         Royanne Foots, DO 03/16/23 1910

## 2023-03-16 NOTE — Assessment & Plan Note (Signed)
In the setting of dehydration will rehydrate and follow renal function

## 2023-03-16 NOTE — H&P (Signed)
[name] WUJ:[id number] DOB: 11-Nov-1934 DOA: 03/16/2023     PCP: [name], [name]   Outpatient Specialists:     Oncology Dr.Katragadda, Vern Claude, MD  GI Dr. Meridee Score, Annell Greening    Patient arrived to ER on 03/16/23 at 1643 Referred by Attending Royanne Foots, DO   Patient coming from:    home Lives alone,        Chief Complaint:   Chief Complaint  Patient presents with   Abnormal Lab    HPI: [name] is a 88 y.o. female with medical history significant of pancreatic carcinoma, hypertension, essential tremor, GERD, HLD, HTN, anemia    Presented with fatigue and abnormal labs  Pt w hx of pancreatic Ca Has been getting radiation therapy was seen in outpatient [name] and was noted to be significantly jaundiced lab work was significant for elevated bilirubin felt to probably have biliary obstruction was sent to [name] for MRCP patient has not been feeling very well and generally fatigued but no associate abdominal pain no nausea no vomiting no fevers or chills   Has been generally weak reports that now has hard time walking because how fatigued she is has been a little bit more confused per family ever since she started her chemotherapy patient currently lives alone her husband passed away last year she has been feeling dehydrated was seen by her otology to get some IV fluids but still feels like she is not completely back to baseline  Had recent UTI and was treated currently asymptomatic Denies significant ETOH intake   Does not smoke   No results found for: "SARSCOV2NAA"      Regarding pertinent Chronic problems:     Hyperlipidemia - not on   statins     HTN on bystlic, norvasc            CKD stage IIIa-   baseline Cr 1.2 Estimated Creatinine Clearance: 19.3 mL/min (A) (by C-G formula based on SCr of 1.92 mg/dL (H)).  Lab Results  Component Value Date   CREATININE 1.92 (H) 03/16/2023   CREATININE 1.98 (H)  03/12/2023   CREATININE 1.19 (H) 02/08/2023   Lab Results  Component Value Date   NA 131 (L) 03/16/2023   CL 103 03/16/2023   K 4.0 03/16/2023   CO2 18 (L) 03/16/2023   BUN 33 (H) 03/16/2023   CREATININE 1.92 (H) 03/16/2023   GFRNONAA 25 (L) 03/16/2023   CALCIUM 9.1 03/16/2023   ALBUMIN 1.9 (L) 03/16/2023   GLUCOSE 130 (H) 03/16/2023       Chronic anemia - baseline hg Hemoglobin & Hematocrit  Recent Labs    02/08/23 0924 03/12/23 1446 03/16/23 1704  HGB 9.8* 9.3* 8.5*   Iron/TIBC/Ferritin/ %Sat    Component Value Date/Time   IRON 55 06/18/2022 1156   TIBC 438 06/18/2022 1156   FERRITIN 32 06/18/2022 1156   IRONPCTSAT 13 06/18/2022 1156      Cancer: Pancreatic cancer followed by oncology On Xeloda   While in ER:     While at the count elevated at 16.9 hemoglobin down to 8.5 Albumin only 1.9 while total bilirubin 4.6  elevated alk phos up to 609 MRCP without contrast ordered [facility] consulted  Lab Orders         CBC with Differential         Comprehensive metabolic panel         Lipase, blood         Protime-INR  Urinalysis, Routine w reflex microscopic -Urine, Clean Catch        CXR -  orderd MRCP - orderd  Following Medications were ordered in ER: Medications  sodium chloride 0.9 % bolus 1,000 mL (1,000 mLs Intravenous New Bag/Given 03/16/23 1819)    _______________________________________________________ ER Provider Called:    [facility]   Dr Marina Goodell  They Recommend admit to medicine   Will see in AM    MRCP ordered    ED Triage Vitals  Encounter Vitals Group     BP 03/16/23 1651 (!) 142/78     Systolic BP Percentile --      Diastolic BP Percentile --      Pulse Rate 03/16/23 1651 74     Resp 03/16/23 1651 16     Temp 03/16/23 1651 98.3 F (36.8 C)     Temp src --      SpO2 03/16/23 1651 99 %     Weight 03/16/23 1653 128 lb (58.1 kg)     Height 03/16/23 1653 5\' 6"  (1.676 m)     Head Circumference --      Peak Flow --      Pain Score  03/16/23 1653 0     Pain Loc --      Pain Education --      Exclude from Growth Chart --   ZOXW(96)@     _________________________________________ Significant initial  Findings: Abnormal Labs Reviewed  CBC WITH DIFFERENTIAL/PLATELET - Abnormal; Notable for the following components:      Result Value   WBC 16.9 (*)    RBC 2.60 (*)    Hemoglobin 8.5 (*)    HCT 26.1 (*)    MCV 100.4 (*)    RDW 17.9 (*)    Neutro Abs 14.4 (*)    Lymphs Abs 0.4 (*)    Monocytes Absolute 1.2 (*)    Abs Immature Granulocytes 0.83 (*)    All other components within normal limits  COMPREHENSIVE METABOLIC PANEL - Abnormal; Notable for the following components:   Sodium 131 (*)    CO2 18 (*)    Glucose, Bld 130 (*)    BUN 33 (*)    Creatinine, Ser 1.92 (*)    Total Protein 6.4 (*)    Albumin 1.9 (*)    ALT 63 (*)    Alkaline Phosphatase 609 (*)    Total Bilirubin 4.6 (*)    GFR, Estimated 25 (*)    All other components within normal limits  PROTIME-INR - Abnormal; Notable for the following components:   Prothrombin Time 16.1 (*)    INR 1.3 (*)    All other components within normal limits  URINALYSIS, ROUTINE W REFLEX MICROSCOPIC - Abnormal; Notable for the following components:   Color, Urine AMBER (*)    APPearance HAZY (*)    Protein, ur 30 (*)    Leukocytes,Ua SMALL (*)    Bacteria, UA RARE (*)    All other components within normal limits      ECG: Ordered     The recent clinical data is shown below. Vitals:   03/16/23 1651 03/16/23 1653 03/16/23 1815 03/16/23 1900  BP: (!) 142/78  (!) 144/80 (!) 145/69  Pulse: 74  76 80  Resp: 16  15 16   Temp: 98.3 F (36.8 C)     SpO2: 99%  100% 100%  Weight:  58.1 kg    Height:  5\' 6"  (1.676 m)      WBC  Component Value Date/Time   WBC 16.9 (H) 03/16/2023 1704   LYMPHSABS 0.4 (L) 03/16/2023 1704   MONOABS 1.2 (H) 03/16/2023 1704   EOSABS 0.0 03/16/2023 1704   BASOSABS 0.1 03/16/2023 1704      UA   no evidence of UTI      Urine  analysis:    Component Value Date/Time   COLORURINE AMBER (A) 03/16/2023 1703   APPEARANCEUR HAZY (A) 03/16/2023 1703   LABSPEC 1.014 03/16/2023 1703   PHURINE 5.0 03/16/2023 1703   GLUCOSEU NEGATIVE 03/16/2023 1703   HGBUR NEGATIVE 03/16/2023 1703   BILIRUBINUR NEGATIVE 03/16/2023 1703   KETONESUR NEGATIVE 03/16/2023 1703   PROTEINUR 30 (A) 03/16/2023 1703   NITRITE NEGATIVE 03/16/2023 1703   LEUKOCYTESUR SMALL (A) 03/16/2023 1703    Results for orders placed or performed in visit on 02/08/23  Urine culture     Status: Abnormal   Collection Time: 02/08/23 10:34 AM   Specimen: Urine, Clean Catch  Result Value Ref Range Status   Specimen Description   Final    URINE, CLEAN CATCH Performed at [name], [address] [place], [place] [address]    Special Requests   Final    NONE Performed at [facility], [address], [place], [place] [address]    Culture (A)  Final    >=100,000 COLONIES/mL ESCHERICHIA COLI Confirmed Extended Spectrum Beta-Lactamase Producer (ESBL).  In bloodstream infections from ESBL organisms, carbapenems are preferred over piperacillin/tazobactam. They are shown to have a lower risk of mortality.    Report Status 02/10/2023 FINAL  Final   Organism ID, Bacteria ESCHERICHIA COLI (A)  Final      Susceptibility   Escherichia coli - MIC*    AMPICILLIN >=32 RESISTANT Resistant     CEFAZOLIN >=64 RESISTANT Resistant     CEFEPIME 16 RESISTANT Resistant     CEFTRIAXONE >=64 RESISTANT Resistant     CIPROFLOXACIN >=4 RESISTANT Resistant     GENTAMICIN <=1 SENSITIVE Sensitive     IMIPENEM <=0.25 SENSITIVE Sensitive     NITROFURANTOIN <=16 SENSITIVE Sensitive     TRIMETH/SULFA >=320 RESISTANT Resistant     AMPICILLIN/SULBACTAM 16 INTERMEDIATE Intermediate     PIP/TAZO <=4 SENSITIVE Sensitive     * >=100,000 COLONIES/mL ESCHERICHIA COLI    ABX started Antibiotics Given (last 72 hours)     None       Susceptibility data from last 90  days. Collected Specimen Info Organism AMPICILLIN AMPICILLIN/SULBACTAM CEFAZOLIN CEFEPIME CEFTRIAXONE Ciprofloxacin Gentamicin Susc lslt Imipenem Nitrofurantoin Susc lslt Piperacillin + Tazobactam Trimethoprim/Sulfa  02/08/23 Urine, Clean Catch Escherichia coli  R  I  R  R  R  R  S  S  S  S  R  01/21/23 Urine, Clean Catch Escherichia coli  R  S  R  R  R  R  S  S  S  S  R    __________________________________________________________ Recent Labs  Lab 03/12/23 1446 03/16/23 1704  NA 130* 131*  K 4.0 4.0  CO2 20* 18*  GLUCOSE 199* 130*  BUN 37* 33*  CREATININE 1.98* 1.92*  CALCIUM 9.7 9.1  MG 2.1  --     Cr  Up from baseline see below Lab Results  Component Value Date   CREATININE 1.92 (H) 03/16/2023   CREATININE 1.98 (H) 03/12/2023   CREATININE 1.19 (H) 02/08/2023    Recent Labs  Lab 03/12/23 1446 03/16/23 1327 03/16/23 1704  AST 44* 41 40  ALT 62* 64* 63*  ALKPHOS 678* 570* 609*  BILITOT 1.2 4.4* 4.6*  PROT 7.0 6.1* 6.4*  ALBUMIN 2.5* 2.0* 1.9*   Lab Results  Component Value Date   CALCIUM 9.1 03/16/2023          Plt: Lab Results  Component Value Date   PLT 385 03/16/2023         Recent Labs  Lab 03/12/23 1446 03/16/23 1704  WBC 13.3* 16.9*  NEUTROABS 12.3* 14.4*  HGB 9.3* 8.5*  HCT 28.5* 26.1*  MCV 100.0 100.4*  PLT 491* 385    HG/HCT stable,     Component Value Date/Time   HGB 8.5 (L) 03/16/2023 1704   HCT 26.1 (L) 03/16/2023 1704   MCV 100.4 (H) 03/16/2023 1704     Recent Labs  Lab 03/16/23 1704  LIPASE 26      _______________________________________________ Hospitalist was called for admission for   Biliary obstruction    Pancreatic adenocarcinoma     The following Work up has been ordered so far:  Orders Placed This Encounter  Procedures   MR ABDOMEN MRCP W WO CONTAST   CBC with Differential   Comprehensive metabolic panel   Lipase, blood   Protime-INR   Urinalysis, Routine w reflex microscopic -Urine, Clean Catch    Consult to gastroenterology   Consult to hospitalist     OTHER Significant initial  Findings:  labs showing:     DM  labs:  HbA1C: No results for input(s): "HGBA1C" in the last 8760 hours.     CBG (last 3)  No results for input(s): "GLUCAP" in the last 72 hours.        Cultures:    Component Value Date/Time   SDES  02/08/2023 1034    URINE, CLEAN CATCH Performed at [facility], [address], [place], [place] [address]    [facility]  02/08/2023 1034    NONE Performed at [facility], [address] [place], [place] [address]    CULT (A) 02/08/2023 1034    >=100,000 COLONIES/mL ESCHERICHIA COLI Confirmed Extended Spectrum Beta-Lactamase Producer (ESBL).  In bloodstream infections from ESBL organisms, carbapenems are preferred over piperacillin/tazobactam. They are shown to have a lower risk of mortality.    REPTSTATUS 02/10/2023 FINAL 02/08/2023 1034     Radiological Exams on Admission: No results found. _______________________________________________________________________________________________________ Latest  Blood pressure (!) 145/69, pulse 80, temperature 98.3 F (36.8 C), resp. rate 16, height 5\' 6"  (1.676 m), weight 58.1 kg, SpO2 100%.   Vitals  labs and radiology finding personally reviewed  Review of Systems:    Pertinent positives include:  , fatigue, weight loss   Constitutional:  No weight loss, night sweats, Fevers, chills HEENT:  No headaches, Difficulty swallowing,Tooth/dental problems,Sore throat,  No sneezing, itching, ear ache, nasal congestion, post nasal drip,  Cardio-vascular:  No chest pain, Orthopnea, PND, anasarca, dizziness, palpitations.no Bilateral lower extremity swelling  GI:  No heartburn, indigestion, abdominal pain, nausea, vomiting, diarrhea, change in bowel habits, loss of appetite, melena, blood in stool, hematemesis Resp:  no shortness of breath at rest. No dyspnea on exertion, No excess mucus, no productive cough, No  non-productive cough, No coughing up of blood.No change in color of mucus.No wheezing. Skin:  no rash or lesions. No jaundice GU:  no dysuria, change in color of urine, no urgency or frequency. No straining to urinate.  No flank pain.  Musculoskeletal:  No joint pain or no joint swelling. No decreased range of motion. No back pain.  Psych:  No change  in mood or affect. No depression or anxiety. No memory loss.  Neuro: no localizing neurological complaints, no tingling, no weakness, no double vision, no gait abnormality, no slurred speech, no confusion  All systems reviewed and apart from HOPI all are negative _______________________________________________________________________________________________ Past Medical History:   Past Medical History:  Diagnosis Date   Essential tremor    GERD (gastroesophageal reflux disease)    High cholesterol    Hypertension    PONV (postoperative nausea and vomiting)    Port-A-Cath in place 01/27/2022   Vertigo       Past Surgical History:  Procedure Laterality Date   BILIARY BRUSHING  09/10/2021   Procedure: BILIARY BRUSHING;  Surgeon: Hilarie Fredrickson, MD;  Location: [facility] ENDOSCOPY;  Service: Gastroenterology;;   BILIARY BRUSHING  10/29/2021   Procedure: BILIARY BRUSHING;  Surgeon: Lemar Lofty., MD;  Location: [facility] ENDOSCOPY;  Service: Gastroenterology;;   BILIARY DILATION  10/29/2021   Procedure: BILIARY DILATION;  Surgeon: Lemar Lofty., MD;  Location: [facility] ENDOSCOPY;  Service: Gastroenterology;;   BILIARY STENT PLACEMENT  09/10/2021   Procedure: BILIARY STENT PLACEMENT;  Surgeon: Hilarie Fredrickson, MD;  Location: [name] ENDOSCOPY;  Service: Gastroenterology;;   BILIARY STENT PLACEMENT N/A 10/03/2021   Procedure: BILIARY STENT PLACEMENT;  Surgeon: Iva Boop, MD;  Location: [facility] ENDOSCOPY;  Service: Gastroenterology;  Laterality: N/A;   BILIARY STENT PLACEMENT N/A 10/29/2021   Procedure: BILIARY STENT PLACEMENT;  Surgeon: Meridee Score  Netty Starring., MD;  Location: [facility] ENDOSCOPY;  Service: Gastroenterology;  Laterality: N/A;   BILIARY STENT PLACEMENT N/A 05/08/2022   Procedure: BILIARY STENT PLACEMENT;  Surgeon: Iva Boop, MD;  Location: [facility] ENDOSCOPY;  Service: Gastroenterology;  Laterality: N/A;   BIOPSY  10/29/2021   Procedure: BIOPSY;  Surgeon: Meridee Score Netty Starring., MD;  Location: [facility] ENDOSCOPY;  Service: Gastroenterology;;   ENDOSCOPIC RETROGRADE CHOLANGIOPANCREATOGRAPHY (ERCP) WITH PROPOFOL N/A 10/29/2021   Procedure: ENDOSCOPIC RETROGRADE CHOLANGIOPANCREATOGRAPHY (ERCP) WITH PROPOFOL;  Surgeon: Lemar Lofty., MD;  Location: [facility] ENDOSCOPY;  Service: Gastroenterology;  Laterality: N/A;   ERCP N/A 09/10/2021   Procedure: ENDOSCOPIC RETROGRADE CHOLANGIOPANCREATOGRAPHY (ERCP);  Surgeon: Hilarie Fredrickson, MD;  Location: [facility] ENDOSCOPY;  Service: Gastroenterology;  Laterality: N/A;   ERCP N/A 10/03/2021   Procedure: ENDOSCOPIC RETROGRADE CHOLANGIOPANCREATOGRAPHY (ERCP);  Surgeon: Iva Boop, MD;  Location: [facility] ENDOSCOPY;  Service: Gastroenterology;  Laterality: N/A;   ERCP N/A 05/08/2022   Procedure: ENDOSCOPIC RETROGRADE CHOLANGIOPANCREATOGRAPHY (ERCP);  Surgeon: Iva Boop, MD;  Location: [facility] ENDOSCOPY;  Service: Gastroenterology;  Laterality: N/A;   ESOPHAGOGASTRODUODENOSCOPY (EGD) WITH PROPOFOL N/A 10/29/2021   Procedure: ESOPHAGOGASTRODUODENOSCOPY (EGD) WITH PROPOFOL;  Surgeon: Meridee Score Netty Starring., MD;  Location: [facility] ENDOSCOPY;  Service: Gastroenterology;  Laterality: N/A;   EUS N/A 10/29/2021   Procedure: UPPER ENDOSCOPIC ULTRASOUND (EUS) RADIAL;  Surgeon: Lemar Lofty., MD;  Location: [facility] ENDOSCOPY;  Service: Gastroenterology;  Laterality: N/A;   FINE NEEDLE ASPIRATION  10/29/2021   Procedure: FINE NEEDLE ASPIRATION (FNA) LINEAR;  Surgeon: Lemar Lofty., MD;  Location: [facility] ENDOSCOPY;  Service: Gastroenterology;;   IR IMAGING GUIDED PORT INSERTION  01/28/2022   PACEMAKER IMPLANT     REMOVAL OF  STONES  10/29/2021   Procedure: REMOVAL OF SLUDGE;  Surgeon: Lemar Lofty., MD;  Location: [facility] ENDOSCOPY;  Service: Gastroenterology;;   Dennison Mascot  09/10/2021   Procedure: Dennison Mascot;  Surgeon: Hilarie Fredrickson, MD;  Location: [name] ENDOSCOPY;  Service: Gastroenterology;;   Burman Freestone CHOLANGIOSCOPY N/A 10/29/2021   Procedure: OVFIEPPI CHOLANGIOSCOPY;  Surgeon: Corliss Parish  Montez Hageman., MD;  Location: [facility] ENDOSCOPY;  Service: Gastroenterology;  Laterality: N/A;   STENT REMOVAL  10/03/2021   Procedure: STENT REMOVAL;  Surgeon: Iva Boop, MD;  Location: [facility] ENDOSCOPY;  Service: Gastroenterology;;    Social History:  Ambulatory   walker     reports that she has never smoked. She has never used smokeless tobacco. She reports that she does not drink alcohol and does not use drugs.     Family History:   Family History  Problem Relation Age of Onset   Cancer Mother        unknown, possibly female cancer   Breast cancer Maternal Aunt        dx 85s   Throat cancer Paternal Uncle    Brain cancer Daughter        glioblastoma d. 61   Stomach cancer Neg Hx    Esophageal cancer Neg Hx    Colon cancer Neg Hx    Pancreatic cancer Neg Hx    ______________________________________________________________________________________________ Allergies: Allergies  Allergen Reactions   Ivp Dye [Iodinated Contrast Media] Hives   Lisinopril Cough     Prior to Admission medications   Medication Sig Start Date End Date Taking? Authorizing Provider  amLODipine (NORVASC) 5 MG tablet 1 tab(s) orally once a day for 90 days 09/01/22   [provider]  aspirin EC 81 MG tablet Take 81 mg by mouth daily. Swallow whole.    [provider]  capecitabine (XELODA) 500 MG tablet Take 2 tablets (1,000 mg total) by mouth 2 (two) times daily after a meal. Take Monday- Friday. Take only on days of radiation. 12/15/22   [name], MD  Cholecalciferol (VITAMIN D3) 50 MCG (2000 UT)  TABS Take 2,000 Units by mouth daily after supper.    [provider]  Coenzyme Q10 (CO Q-10) 100 MG CAPS Take 100 mg by mouth every evening.    [provider]  diphenhydrAMINE (BENADRYL) 50 MG tablet Take 1 tablet (50 mg total) by mouth once for 1 dose. Take 1 hour prior to scan 03/15/23 03/15/23  [name], MD  famotidine (PEPCID) 40 MG tablet Take 40 mg by mouth every evening. 10/02/21   [provider]  gabapentin (NEURONTIN) 100 MG capsule Take 100 mg by mouth 2 (two) times daily.    [provider]  meclizine (ANTIVERT) 25 MG tablet Take 25 mg by mouth daily as needed for dizziness.    [provider]  megestrol (MEGACE) 400 MG/10ML suspension Take 10 mLs (400 mg total) by mouth 2 (two) times daily. 11/02/22   [name], MD  methylPREDNISolone (MEDROL DOSEPAK) 4 MG TBPK tablet Take as directed 02/08/23   [name], MD  Misc Natural Products (JOINT SUPPORT) CAPS Take 1 capsule by mouth daily with breakfast.    [provider]  Multiple Vitamin (MULTIVITAMIN) tablet Take 1 tablet by mouth daily with breakfast.    [provider]  Multiple Vitamins-Minerals (OCUVITE EYE HEALTH FORMULA) CAPS Take 1 capsule by mouth daily.    [provider]  nebivolol (BYSTOLIC) 5 MG tablet Take 2.5 mg by mouth in the morning.    [provider]  nitrofurantoin, macrocrystal-monohydrate, (MACROBID) 100 MG capsule Take 1 capsule (100 mg total) by mouth 2 (two) times daily. 02/08/23   [name], MD  omeprazole (PRILOSEC) 40 MG capsule Take 40 mg by mouth daily before breakfast.    [provider]  polyethylene glycol (MIRALAX) 17 g packet Take 17 g  by mouth daily as needed. Patient taking differently: Take 17 g by mouth daily as needed for moderate constipation. 09/11/21   Burnadette Pop, MD  Polyvinyl Alcohol-Povidone (REFRESH OP) Place 1 drop into both eyes daily as needed (tired eyes).     [provider]  predniSONE (DELTASONE) 50 MG tablet Take 13 hours, 7 hours and 1 hour prior to CT scan 03/15/23   [name], MD  primidone (MYSOLINE) 50 MG tablet Take 25 mg by mouth daily as needed (for tremors).    [provider]  pyridOXINE (VITAMIN B-6) 100 MG tablet Take 50 mg by mouth in the morning.    [provider]  tiZANidine (ZANAFLEX) 2 MG tablet Take 1 mg by mouth daily as needed for muscle spasms.    [provider]    ___________________________________________________________________________________________________ Physical Exam:    03/16/2023    7:00 PM 03/16/2023    6:15 PM 03/16/2023    4:53 PM  Vitals with BMI  Height   5\' 6"   Weight   128 lbs  BMI   20.67  Systolic 145 144   Diastolic 69 80   Pulse 80 76      1. General:  in No  Acute distress   Chronically ill   -appearing 2. Psychological: Alert and   Oriented to self 3. Head/ENT:   Dry Mucous Membranes                          Head Non traumatic, neck supple                         Poor Dentition 4. SKIN:  decreased Skin turgor,  Skin clean Dry and intact no rash    5. Heart: Regular rate and rhythm no  Murmur, no Rub or gallop 6. Lungs:  , no wheezes or crackles   7. Abdomen: Soft,  non-tender,   distended bowel sounds present 8. Lower extremities: no clubbing, cyanosis, no  edema 9. Neurologically Grossly intact, moving all 4 extremities equally   10. MSK: Normal range of motion    Chart has been reviewed  ______________________________________________________________________________________________  Assessment/Plan   88 y.o. female with medical history significant of pancreatic carcinoma, hypertension, essential tremor, GERD, HLD, HTN, anemia  Admitted for   Biliary obstruction due to Pancreatic adenocarcinoma AKI     Present on Admission:  Biliary obstruction  Essential hypertension  Mixed hyperlipidemia  Leukocytosis  Iron deficiency  anemia  Pancreatic carcinoma (HCC)  Hyponatremia  S/P placement of cardiac pacemaker  Debility  AKI (acute kidney injury) (HCC)  CKD (chronic kidney disease), stage III (HCC)     Biliary obstruction [facility] is aware MRCP ordered will have to get in AM bc pt has pacemaker No fver /chills to sudgest infection at this time   Essential hypertension Continue Bystolic 2.5 mg in the morning and Norvasc 5 mg a day  Mixed hyperlipidemia Chronic stable  Leukocytosis Chronic recurrent  Iron deficiency anemia Obtain anemia panel transfuse for hemoglobin below 7  Pancreatic carcinoma (HCC) Patient will need father follow-up with oncology hold Xeloda for today email oncology patient has been admitted  Hyponatremia Possibly in the setting of dehydration and also for completion chest x-ray and TSH Continue to cycle BMEt  S/P placement of cardiac pacemaker Hx of pacemaker for bradycardia  Debility Would benefit from PT/OT eval prior to dc  AKI (acute kidney injury) (HCC) In  the setting of dehydration will rehydrate and follow renal function  CKD (chronic kidney disease), stage III (HCC)  -chronic avoid nephrotoxic medications such as NSAIDs, Vanco Zosyn combo,  avoid hypotension, continue to follow renal function   Other plan as per orders.  DVT prophylaxis:  SCD       Code Status:    Code Status: Prior FULL CODE   as per patient   I had personally discussed CODE STATUS with patient   ACP   none   Family Communication:   Family  at  Bedside  plan of care was discussed  with GrandSon,    Diet  hert healthy npo post midnight   Disposition Plan:        To home once workup is complete and patient is stable   Following barriers for discharge:                                                        Electrolytes corrected                               Anemia  stable                                                        Will need to be able to tolerate PO                                                        Will need consultants to evaluate patient prior to discharge       Consult Orders  (From admission, onward)           Start     Ordered   03/16/23 1846  Consult to hospitalist  Pg by Viviann Spare  Once       Provider:  (Not yet assigned)  Question Answer Comment  Place call to: [facility]   Reason for Consult Admit      03/16/23 1845                               Would benefit from PT/OT eval prior to DC  Ordered                    Consults called: [facility] is aware emailed sent to Dr   Admission status:  ED Disposition     ED Disposition  Admit   Condition  --   Comment  Hospital Area: [facility] [[id number]]  Level of Care: Telemetry Medical [[id number]]  May place patient in observation at [facility] or [facility] if equivalent level of care is available:: No  Covid Evaluation: Asymptomatic - no recent exposure (last 10 days) testing not required  Diagnosis: Biliary obstruction [147829]  Admitting Physician: Therisa Doyne [[id number]]  Attending Physician: Therisa Doyne [[id number]]  Obs    Level of care     tele  For 12H    Lovelle Lema 03/16/2023, 8:23 PM    [facility]     after 2 AM please page floor coverage [place] If 7AM-7PM, please contact the day team taking care of the patient using [email or web address]

## 2023-03-16 NOTE — Assessment & Plan Note (Signed)
-  chronic avoid nephrotoxic medications such as NSAIDs, Vanco Zosyn combo,  avoid hypotension, continue to follow renal function

## 2023-03-16 NOTE — Patient Instructions (Signed)
[facility]  Discharge Instructions: Thank you for choosing [facility] to provide your oncology and hematology care.  If you have a lab appointment with the Cancer Center - please note that after April 8th, 2024, all labs will be drawn in the cancer center.  You do not have to check in or register with the main entrance as you have in the past but will complete your check-in in the cancer center.  Wear comfortable clothing and clothing appropriate for easy access to any Portacath or PICC line.   We strive to give you quality time with your provider. You may need to reschedule your appointment if you arrive late (15 or more minutes).  Arriving late affects you and other patients whose appointments are after yours.  Also, if you miss three or more appointments without notifying the office, you may be dismissed from the clinic at the provider's discretion.      For prescription refill requests, have your pharmacy contact our office and allow 72 hours for refills to be completed.     To help prevent nausea and vomiting after your treatment, we encourage you to take your nausea medication as directed.  BELOW ARE SYMPTOMS THAT SHOULD BE REPORTED IMMEDIATELY: *FEVER GREATER THAN 100.4 F (38 C) OR HIGHER *CHILLS OR SWEATING *NAUSEA AND VOMITING THAT IS NOT CONTROLLED WITH YOUR NAUSEA MEDICATION *UNUSUAL SHORTNESS OF BREATH *UNUSUAL BRUISING OR BLEEDING *URINARY PROBLEMS (pain or burning when urinating, or frequent urination) *BOWEL PROBLEMS (unusual diarrhea, constipation, pain near the anus) TENDERNESS IN MOUTH AND THROAT WITH OR WITHOUT PRESENCE OF ULCERS (sore throat, sores in mouth, or a toothache) UNUSUAL RASH, SWELLING OR PAIN  UNUSUAL VAGINAL DISCHARGE OR ITCHING   Items with * indicate a potential emergency and should be followed up as soon as possible or go to the Emergency Department if any problems should occur.  Please show the CHEMOTHERAPY ALERT  CARD or IMMUNOTHERAPY ALERT CARD at check-in to the Emergency Department and triage nurse.  Should you have questions after your visit or need to cancel or reschedule your appointment, please contact [facility] [phone]  and follow the prompts.  Office hours are 8:00 a.m. to 4:30 p.m. Monday - Friday. Please note that voicemails left after 4:00 p.m. may not be returned until the following business day.  We are closed weekends and major holidays. You have access to a nurse at all times for urgent questions. Please call the main number to the clinic [phone] and follow the prompts.  For any non-urgent questions, you may also contact your provider using MyChart. We now offer e-Visits for anyone 35 and older to request care online for non-urgent symptoms. For details visit [email or web address].   Also download the MyChart app! Go to the app store, search "MyChart", open the app, select [place], and log in with your MyChart username and password.

## 2023-03-16 NOTE — Assessment & Plan Note (Signed)
Patient will need father follow-up with oncology hold [facility] for today email oncology patient has been admitted

## 2023-03-16 NOTE — Subjective & Objective (Signed)
Pt w hx of pancreatic Ca Has been getting radiation therapy was seen in outpatient [facility] and was noted to be significantly jaundiced lab work was significant for elevated bilirubin felt to probably have biliary obstruction was sent to [facility] for MRCP patient has not been feeling very well and generally fatigued but no associate abdominal pain no nausea no vomiting no fevers or chills

## 2023-03-17 ENCOUNTER — Inpatient Hospital Stay: Payer: Medicare HMO | Admitting: Hematology

## 2023-03-17 ENCOUNTER — Encounter: Payer: Self-pay | Admitting: Hematology

## 2023-03-17 DIAGNOSIS — K831 Obstruction of bile duct: Secondary | ICD-10-CM | POA: Diagnosis not present

## 2023-03-17 DIAGNOSIS — C259 Malignant neoplasm of pancreas, unspecified: Secondary | ICD-10-CM | POA: Diagnosis not present

## 2023-03-17 LAB — COMPREHENSIVE METABOLIC PANEL
ALT: 53 U/L — ABNORMAL HIGH (ref 0–44)
AST: 34 U/L (ref 15–41)
Albumin: 1.6 g/dL — ABNORMAL LOW (ref 3.5–5.0)
Alkaline Phosphatase: 545 U/L — ABNORMAL HIGH (ref 38–126)
Anion gap: 10 (ref 5–15)
BUN: 30 mg/dL — ABNORMAL HIGH (ref 8–23)
CO2: 18 mmol/L — ABNORMAL LOW (ref 22–32)
Calcium: 8.9 mg/dL (ref 8.9–10.3)
Chloride: 103 mmol/L (ref 98–111)
Creatinine, Ser: 1.82 mg/dL — ABNORMAL HIGH (ref 0.44–1.00)
GFR, Estimated: 27 mL/min — ABNORMAL LOW (ref >60)
Glucose, Bld: 109 mg/dL — ABNORMAL HIGH (ref 70–99)
Potassium: 3.7 mmol/L (ref 3.5–5.1)
Sodium: 131 mmol/L — ABNORMAL LOW (ref 135–145)
Total Bilirubin: 3.6 mg/dL — ABNORMAL HIGH (ref 0.3–1.2)
Total Protein: 5.4 g/dL — ABNORMAL LOW (ref 6.5–8.1)

## 2023-03-17 LAB — CBC
HCT: 21.3 % — ABNORMAL LOW (ref 36.0–46.0)
Hemoglobin: 7.2 g/dL — ABNORMAL LOW (ref 12.0–15.0)
MCH: 32.7 pg (ref 26.0–34.0)
MCHC: 33.8 g/dL (ref 30.0–36.0)
MCV: 96.8 fL (ref 80.0–100.0)
Platelets: 331 10*3/uL (ref 150–400)
RBC: 2.2 MIL/uL — ABNORMAL LOW (ref 3.87–5.11)
RDW: 17.9 % — ABNORMAL HIGH (ref 11.5–15.5)
WBC: 14.1 10*3/uL — ABNORMAL HIGH (ref 4.0–10.5)
nRBC: 0 % (ref 0.0–0.2)

## 2023-03-17 LAB — PHOSPHORUS: Phosphorus: 3.9 mg/dL (ref 2.5–4.6)

## 2023-03-17 LAB — VITAMIN B12: Vitamin B-12: 1667 pg/mL — ABNORMAL HIGH (ref 180–914)

## 2023-03-17 LAB — FOLATE: Folate: 20.3 ng/mL (ref >5.9)

## 2023-03-17 LAB — MAGNESIUM: Magnesium: 2 mg/dL (ref 1.7–2.4)

## 2023-03-17 LAB — PREALBUMIN: Prealbumin: 6 mg/dL — ABNORMAL LOW (ref 18–38)

## 2023-03-17 MED ORDER — PIPERACILLIN-TAZOBACTAM 3.375 G IVPB
3.3750 g | Freq: Three times a day (TID) | INTRAVENOUS | Status: DC
  Administered 2023-03-17 – 2023-03-18 (×4): 3.375 g via INTRAVENOUS
  Filled 2023-03-17 (×4): qty 50

## 2023-03-17 MED ORDER — LORATADINE 10 MG PO TABS
10.0000 mg | ORAL_TABLET | Freq: Every day | ORAL | Status: DC
  Administered 2023-03-17 – 2023-03-24 (×8): 10 mg via ORAL
  Filled 2023-03-17 (×8): qty 1

## 2023-03-17 MED ORDER — CHLORHEXIDINE GLUCONATE CLOTH 2 % EX PADS
6.0000 | MEDICATED_PAD | Freq: Every day | CUTANEOUS | Status: DC
  Administered 2023-03-17 – 2023-03-24 (×6): 6 via TOPICAL

## 2023-03-17 MED ORDER — SODIUM CHLORIDE 0.9 % IV SOLN: INTRAVENOUS | Status: AC

## 2023-03-17 MED ORDER — SODIUM PHOSPHATES 45 MMOLE/15ML IV SOLN
15.0000 mmol | Freq: Once | INTRAVENOUS | Status: AC
  Administered 2023-03-17: 15 mmol via INTRAVENOUS
  Filled 2023-03-17: qty 5

## 2023-03-17 NOTE — Progress Notes (Signed)
Report called to ED RN, Aundra Millet. Patient and family instructed to go directly to [facility] ED per Dr. Ellin Saba. Patient and family agreeable.

## 2023-03-17 NOTE — Consult Note (Addendum)
Attending physician's note   I have taken a history, reviewed the chart, and examined the patient. I performed a substantive portion of this encounter, including complete performance of at least one of the key components, in conjunction with the APP. I agree with the APP's note, impression, and recommendations with my edits.   88 year old female with medical history as outlined below, to include history of pancreatic adenocarcinoma (radiation and chemotherapy), admitted with elevated liver enzymes with concern for CBD stent occlusion.  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  Admission evaluation notable for the following: - NA 131, CO2 18, BUN/creatinine 33/1.9 - AST/ALT 40/63, ALP 609, T. bili 4.6 - Normal lipase - WBC 13.3--> 16.9 - H/H8.5/26 (baseline Hgb ~10) - PCT 5.96 - CXR: No acute cardiopulmonary disease  Comparison labs from 03/12/2023: - AST/ALT 44/62, ALP 678, T. 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 bili 4) AKI on CKD 3 - Plan for MRI/MRCP today - Trend liver enzymes - Will discuss with advanced biliary service re: repeat ERCP pending MRCP results - Continue Zosyn   Ann Held, FACG [phone] office          Referring Provider: Dr. Ellin Saba Primary Care Physician:  [name], [name] Primary Gastroenterologist:  Dr. Marina Goodell  Reason for Consultation:  biliary obstruction, pancreatic adeno  HPI: Meagan [name] is a 88 y.o. female with past medical history significant for pancreatic adenocarcinoma, hypertension, essential tremor, GERD, hypertension, hyperlipidemia, chronic anemia.  She presents to [facility] at the request of her oncologist after having abnormal labs.  Labs showed elevated LFTs compared to 1 month ago and thought was possibly due to biliary obstruction/stent occlusion.  No  abdominal pain, nausea, vomiting, fever, chills.  Has been feeling generally weak and fatigued.  LFTs found to be up as compared to 1 month ago.  A month ago LFTs normal except for alk phos slightly elevated at 223.  Now ALT up to 50/60s, AST remains normal, total bili up to 4.6 yesterday, and alk phos up to 500/600s.  White blood cell count up to 17K yesterday, 14.1 K today.  Hemoglobin 7.2 g today compared to 9.3 g 5 days ago.  Prealbumin is extremely low.  CA 19-9 up to 5840 just 5 days ago.  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 in stent stenosis - treated with second 10x60 mm uncovered SEMS with good result  Last imaging in 11/2022.  Supposed to go for MRI abdomen/MRCP this morning.  Past Medical History:  Diagnosis Date   Essential tremor    GERD (gastroesophageal reflux disease)    High cholesterol    Hypertension    PONV (postoperative nausea and vomiting)    Port-A-Cath in place 01/27/2022   Vertigo     Past Surgical History:  Procedure Laterality Date   BILIARY BRUSHING  09/10/2021   Procedure: BILIARY BRUSHING;  Surgeon: Hilarie Fredrickson, MD;  Location: [facility] ENDOSCOPY;  Service: Gastroenterology;;   BILIARY BRUSHING  10/29/2021   Procedure: BILIARY BRUSHING;  Surgeon: Lemar Lofty., MD;  Location: [facility] ENDOSCOPY;  Service: Gastroenterology;;   BILIARY DILATION  10/29/2021   Procedure: BILIARY DILATION;  Surgeon: Lemar Lofty., MD;  Location: [facility] ENDOSCOPY;  Service: Gastroenterology;;   BILIARY STENT PLACEMENT  09/10/2021   Procedure: BILIARY STENT PLACEMENT;  Surgeon: Yancey Flemings  N, MD;  Location: [facility] ENDOSCOPY;  Service: Gastroenterology;;   BILIARY STENT PLACEMENT N/A 10/03/2021   Procedure: BILIARY STENT PLACEMENT;  Surgeon: Iva Boop, MD;  Location: [facility] ENDOSCOPY;  Service: Gastroenterology;  Laterality: N/A;   BILIARY STENT PLACEMENT N/A 10/29/2021   Procedure: BILIARY STENT PLACEMENT;  Surgeon: Meridee Score Netty Starring., MD;  Location: [facility] ENDOSCOPY;  Service: Gastroenterology;  Laterality: N/A;   BILIARY STENT PLACEMENT N/A 05/08/2022   Procedure: BILIARY STENT PLACEMENT;  Surgeon: Iva Boop, MD;  Location: [facility] ENDOSCOPY;  Service: Gastroenterology;  Laterality: N/A;   BIOPSY  10/29/2021   Procedure: BIOPSY;  Surgeon: Meridee Score Netty Starring., MD;  Location: [facility] ENDOSCOPY;  Service: Gastroenterology;;   ENDOSCOPIC RETROGRADE CHOLANGIOPANCREATOGRAPHY (ERCP) WITH PROPOFOL N/A 10/29/2021   Procedure: ENDOSCOPIC RETROGRADE CHOLANGIOPANCREATOGRAPHY (ERCP) WITH PROPOFOL;  Surgeon: Lemar Lofty., MD;  Location: [facility] ENDOSCOPY;  Service: Gastroenterology;  Laterality: N/A;   ERCP N/A 09/10/2021   Procedure: ENDOSCOPIC RETROGRADE CHOLANGIOPANCREATOGRAPHY (ERCP);  Surgeon: Hilarie Fredrickson, MD;  Location: [facility] ENDOSCOPY;  Service: Gastroenterology;  Laterality: N/A;   ERCP N/A 10/03/2021   Procedure: ENDOSCOPIC RETROGRADE CHOLANGIOPANCREATOGRAPHY (ERCP);  Surgeon: Iva Boop, MD;  Location: [facility] ENDOSCOPY;  Service: Gastroenterology;  Laterality: N/A;   ERCP N/A 05/08/2022   Procedure: ENDOSCOPIC RETROGRADE CHOLANGIOPANCREATOGRAPHY (ERCP);  Surgeon: Iva Boop, MD;  Location: [facility] ENDOSCOPY;  Service: Gastroenterology;  Laterality: N/A;   ESOPHAGOGASTRODUODENOSCOPY (EGD) WITH PROPOFOL N/A 10/29/2021   Procedure: ESOPHAGOGASTRODUODENOSCOPY (EGD) WITH PROPOFOL;  Surgeon: Meridee Score Netty Starring., MD;  Location: [facility] ENDOSCOPY;  Service: Gastroenterology;  Laterality: N/A;   EUS N/A 10/29/2021   Procedure: UPPER ENDOSCOPIC ULTRASOUND (EUS) RADIAL;  Surgeon: Lemar Lofty., MD;  Location: [facility] ENDOSCOPY;  Service: Gastroenterology;  Laterality: N/A;   FINE NEEDLE ASPIRATION  10/29/2021   Procedure: FINE NEEDLE ASPIRATION (FNA) LINEAR;  Surgeon: Lemar Lofty., MD;  Location: [facility] ENDOSCOPY;  Service: Gastroenterology;;   IR IMAGING GUIDED PORT INSERTION  01/28/2022   PACEMAKER IMPLANT     REMOVAL OF STONES   10/29/2021   Procedure: REMOVAL OF SLUDGE;  Surgeon: Lemar Lofty., MD;  Location: [facility] ENDOSCOPY;  Service: Gastroenterology;;   Dennison Mascot  09/10/2021   Procedure: Dennison Mascot;  Surgeon: Hilarie Fredrickson, MD;  Location: [facility] ENDOSCOPY;  Service: Gastroenterology;;   Burman Freestone CHOLANGIOSCOPY N/A 10/29/2021   Procedure: ZOXWRUEA CHOLANGIOSCOPY;  Surgeon: Lemar Lofty., MD;  Location: [facility] ENDOSCOPY;  Service: Gastroenterology;  Laterality: N/A;   STENT REMOVAL  10/03/2021   Procedure: STENT REMOVAL;  Surgeon: Iva Boop, MD;  Location: [facility] ENDOSCOPY;  Service: Gastroenterology;;    Prior to Admission medications   Medication Sig Start Date End Date Taking? Authorizing Provider  acetaminophen (TYLENOL) 500 MG tablet Take 500 mg by mouth as needed for mild pain (pain score 1-3) or moderate pain (pain score 4-6).   Yes [provider]  amLODipine (NORVASC) 5 MG tablet Take 5 mg by mouth at bedtime. 09/01/22  Yes [provider]  aspirin EC 81 MG tablet Take 81 mg by mouth in the morning. Swallow whole.   Yes [provider]  cetirizine (ZYRTEC) 10 MG tablet Take 10 mg by mouth as needed for allergies.   Yes [provider]  Cholecalciferol (VITAMIN D3) 50 MCG (2000 UT) TABS Take 2,000 Units by mouth at bedtime.   Yes [provider]  Coenzyme Q10 (CO Q-10) 100 MG CAPS Take 100 mg by mouth at bedtime.   Yes [provider]  diphenhydrAMINE (BENADRYL) 50 MG  tablet Take 1 tablet (50 mg total) by mouth once for 1 dose. Take 1 hour prior to scan 03/15/23 03/17/23 Yes [name], MD  famotidine (PEPCID) 40 MG tablet Take 40 mg by mouth every evening. 10/02/21  Yes [provider]  gabapentin (NEURONTIN) 100 MG capsule Take 100 mg by mouth 2 (two) times daily.   Yes [provider]  lidocaine-prilocaine (EMLA) cream Apply 1 Application topically as needed (port).   Yes [provider]  meclizine  (ANTIVERT) 25 MG tablet Take 25 mg by mouth daily as needed for dizziness.   Yes [provider]  megestrol (MEGACE) 400 MG/10ML suspension Take 10 mLs (400 mg total) by mouth 2 (two) times daily. 11/02/22  Yes [name], MD  Misc Natural Products (JOINT SUPPORT) CAPS Take 1 capsule by mouth daily with breakfast.   Yes [provider]  Multiple Vitamin (MULTIVITAMIN) tablet Take 1 tablet by mouth daily with breakfast.   Yes [provider]  Multiple Vitamins-Minerals (PRESERVISION AREDS PO) Take 1 tablet by mouth in the morning and at bedtime.   Yes [provider]  nebivolol (BYSTOLIC) 5 MG tablet Take 2.5 mg by mouth in the morning.   Yes [provider]  omeprazole (PRILOSEC) 40 MG capsule Take 40 mg by mouth daily before breakfast.   Yes [provider]  polyethylene glycol (MIRALAX) 17 g packet Take 17 g by mouth daily as needed. Patient taking differently: Take 17 g by mouth daily as needed for moderate constipation. 09/11/21  Yes Burnadette Pop, MD  Polyvinyl Alcohol-Povidone (REFRESH OP) Place 1 drop into both eyes daily as needed (tired eyes).   Yes [provider]  predniSONE (DELTASONE) 50 MG tablet Take 13 hours, 7 hours and 1 hour prior to CT scan 03/15/23  Yes [name], MD  primidone (MYSOLINE) 50 MG tablet Take 50 mg by mouth daily as needed (for tremors).   Yes [provider]  pyridOXINE (VITAMIN B-6) 100 MG tablet Take 100 mg by mouth in the morning.   Yes [provider]  tiZANidine (ZANAFLEX) 2 MG tablet Take 1 mg by mouth in the morning.   Yes [provider]  capecitabine (XELODA) 500 MG tablet Take 2 tablets (1,000 mg total) by mouth 2 (two) times daily after a meal. Take Monday- Friday. Take only on days of radiation. Patient not taking: Reported on 03/17/2023 12/15/22   [name], MD  methylPREDNISolone (MEDROL DOSEPAK) 4 MG TBPK tablet Take as  directed Patient not taking: Reported on 03/17/2023 02/08/23   [name], MD  nitrofurantoin, macrocrystal-monohydrate, (MACROBID) 100 MG capsule Take 1 capsule (100 mg total) by mouth 2 (two) times daily. Patient not taking: Reported on 03/17/2023 02/08/23   [name], MD    Current Facility-Administered Medications  Medication Dose Route Frequency Provider Last Rate Last Admin   acetaminophen (TYLENOL) tablet 650 mg  650 mg Oral Q6H PRN Therisa Doyne, MD       Or   acetaminophen (TYLENOL) suppository 650 mg  650 mg Rectal Q6H PRN Doutova, Anastassia, MD       ALPRAZolam Prudy Feeler) tablet 0.5 mg  0.5 mg Oral Once PRN Therisa Doyne, MD       amLODipine (NORVASC) tablet 5 mg  5 mg Oral Daily Doutova, Anastassia, MD   5 mg at 03/17/23 0859   Chlorhexidine Gluconate Cloth 2 % PADS 6 each  6 each Topical Daily Therisa Doyne, MD   6 each at 03/17/23 [phone]  feeding supplement (ENSURE ENLIVE / ENSURE PLUS) liquid 237 mL  237 mL Oral BID BM Doutova, Anastassia, MD       gabapentin (NEURONTIN) capsule 100 mg  100 mg Oral BID Therisa Doyne, MD   100 mg at 03/17/23 0859   HYDROcodone-acetaminophen (NORCO/VICODIN) 5-325 MG per tablet 1-2 tablet  1-2 tablet Oral Q4H PRN Therisa Doyne, MD       influenza vaccine adjuvanted (FLUAD) injection 0.5 mL  0.5 mL Intramuscular Tomorrow-1000 Doutova, Anastassia, MD       nebivolol (BYSTOLIC) tablet 2.5 mg  2.5 mg Oral Daily Doutova, Anastassia, MD   2.5 mg at 03/17/23 0900   ondansetron (ZOFRAN) tablet 4 mg  4 mg Oral Q6H PRN Therisa Doyne, MD       Or   ondansetron (ZOFRAN) injection 4 mg  4 mg Intravenous Q6H PRN Doutova, Anastassia, MD       pantoprazole (PROTONIX) EC tablet 40 mg  40 mg Oral Daily Doutova, Anastassia, MD   40 mg at 03/17/23 0859   piperacillin-tazobactam (ZOSYN) IVPB 3.375 g  3.375 g Intravenous Q8H Rexford Maus, RPH        Allergies as of 03/16/2023 - Review Complete 03/16/2023   Allergen Reaction Noted   Ivp dye [iodinated contrast media] Hives 03/26/2016   Lisinopril Cough 09/07/2021    Family History  Problem Relation Age of Onset   Cancer Mother        unknown, possibly female cancer   Breast cancer Maternal Aunt        dx 72s   Throat cancer Paternal Uncle    Brain cancer Daughter        glioblastoma d. 46   Stomach cancer Neg Hx    Esophageal cancer Neg Hx    Colon cancer Neg Hx    Pancreatic cancer Neg Hx     Social History   Socioeconomic History   Marital status: Widowed    Spouse name: Not on file   Number of children: Not on file   Years of education: Not on file   Highest education level: Not on file  Occupational History   Not on file  Tobacco Use   Smoking status: Never   Smokeless tobacco: Never  Vaping Use   Vaping status: Never Used  Substance and Sexual Activity   Alcohol use: Never   Drug use: Never   Sexual activity: Not on file  Other Topics Concern   Not on file  Social History Narrative   Not on file   Social Determinants of Health   Financial Resource Strain: Not on file  Food Insecurity: No Food Insecurity (03/16/2023)   Hunger Vital Sign    Worried About Running Out of Food in the Last Year: Never true    Ran Out of Food in the Last Year: Never true  Transportation Needs: No Transportation Needs (03/16/2023)   PRAPARE - Administrator, Civil Service (Medical): No    Lack of Transportation (Non-Medical): No  Physical Activity: Not on file  Stress: Not on file  Social Connections: Not on file  Intimate Partner Violence: Not At Risk (03/16/2023)   Humiliation, Afraid, Rape, and Kick questionnaire    Fear of Current or Ex-Partner: No    Emotionally Abused: No    Physically Abused: No    Sexually Abused: No    Review of Systems: ROS is O/W negative except as mentioned in HPI.  Physical Exam: Vital signs in last 24 hours: Temp:  [  97.4 F (36.3 C)-98.4 F (36.9 C)] 97.4 F (36.3 C)  (10/16 0833) Pulse Rate:  [74-89] 74 (10/16 0833) Resp:  [15-20] 17 (10/16 0833) BP: (123-147)/(64-85) 138/64 (10/16 0833) SpO2:  [98 %-100 %] 99 % (10/16 0833) Weight:  [58.1 kg] 58.1 kg (10/15 1945) Last BM Date : 03/16/23 General:  Alert, elderly, pleasant and cooperative in NAD Head:  Normocephalic and atraumatic. Eyes:  Slight scleral icterus noted. Ears:  Normal auditory acuity. Mouth:  No deformity or lesions.   Lungs:  Clear throughout to auscultation.  No wheezes, crackles, or rhonchi.  Heart:  Regular rate and rhythm; no murmurs, clicks, rubs, or gallops. Abdomen:  Soft, non-distended.  BS present.  Non-tender. Msk:  Symmetrical without gross deformities. Pulses:  Normal pulses noted. Extremities:  Without clubbing or edema. Neurologic:  Alert and oriented x 4;  grossly normal neurologically. Skin:  Intact without significant lesions or rashes. Psych:  Alert and cooperative. Normal mood and affect.  Intake/Output from previous day: 10/15 0701 - 10/16 0700 In: 415 [I.V.:311.5; IV Piggyback:103.4] Out: -   Lab Results: Recent Labs    03/16/23 1704 03/17/23 0706  WBC 16.9* 14.1*  HGB 8.5* 7.2*  HCT 26.1* 21.3*  PLT 385 331   BMET Recent Labs    03/16/23 1704 03/17/23 0706  NA 131* 131*  K 4.0 3.7  CL 103 103  CO2 18* 18*  GLUCOSE 130* 109*  BUN 33* 30*  CREATININE 1.92* 1.82*  CALCIUM 9.1 8.9   LFT Recent Labs    03/16/23 1327 03/16/23 1704 03/17/23 0706  PROT 6.1*   < > 5.4*  ALBUMIN 2.0*   < > 1.6*  AST 41   < > 34  ALT 64*   < > 53*  ALKPHOS 570*   < > 545*  BILITOT 4.4*   < > 3.6*  BILIDIR 3.3*  --   --   IBILI 1.1*  --   --    < > = values in this interval not displayed.   PT/INR Recent Labs    03/16/23 1704  LABPROT 16.1*  INR 1.3*   Studies/Results: DG Chest Port 1 View  Result Date: 03/16/2023 CLINICAL DATA:  Fatigue. EXAM: PORTABLE CHEST 1 VIEW COMPARISON:  Chest radiograph dated 05/07/2022 and CT dated 12/07/2022.  FINDINGS: Right-sided Port-A-Cath with tip close to the cavoatrial junction. There is diffuse chronic interstitial coarsening. No focal consolidation, pleural effusion, or pneumothorax. The cardiac silhouette is within normal limits. Atherosclerotic calcification of the aorta. Osteopenia with degenerative changes of spine. No acute osseous pathology. IMPRESSION: 1. No active disease. 2. Chronic interstitial coarsening. Electronically Signed   By: Elgie Collard M.D.   On: 03/16/2023 22:35    IMPRESSION:  *88 year old female with pancreatic adenocarcinoma status post biliary stents last in 05/2022.  Has completed chemo and radiation per her report.  Last imaging July.  CA 19-9 higher than previous on recheck last week.  Now with increasing LFTs again, suspect recurrent biliary obstruction/stent occlusion.  Going for MRI/MRCP later this morning.  PLAN: -Await results of MRI/MRCP. -Plans for ERCP and repeat stenting pending those results. -Trend LFTs.  Princella Pellegrini. Zehr  03/17/2023, 9:04 AM

## 2023-03-17 NOTE — Progress Notes (Signed)
Pharmacy Antibiotic Note  Meagan [name] is a 88 y.o. female admitted on 03/16/2023. Was seen at [facility] outpatient center and noted to be jaundiced, found to have elevated bilirubin and concern for biliary obstruction. Notable PMH includes pancreatic cancer (on XRT). GI consulted and planning for MRCP. Pharmacy has been consulted for Zosyn dosing.  Scr 1.82 (slightly improved from admit, BL ~1.2), CrCl 20.4 mL/min. WBC 16 > 14, afebrile.   Plan: Zosyn 3.375g IV q8 hours (extended infusion for now)  Monitor renal function and adjust Zosyn as needed  Height: 5\' 6"  (167.6 cm) Weight: 58.1 kg (128 lb 1.4 oz) IBW/kg (Calculated) : 59.3  Temp (24hrs), Avg:98.1 F (36.7 C), Min:97.4 F (36.3 C), Max:98.4 F (36.9 C)  Recent Labs  Lab 03/12/23 1446 03/16/23 1704 03/17/23 0706  WBC 13.3* 16.9* 14.1*  CREATININE 1.98* 1.92* 1.82*    Estimated Creatinine Clearance: 20.4 mL/min (A) (by C-G formula based on SCr of 1.82 mg/dL (H)).    Allergies  Allergen Reactions   Ivp Dye [Iodinated Contrast Media] Hives   Lisinopril Cough    Antimicrobials this admission: Zosyn 10/16 >>   Dose adjustments this admission: N/A  Microbiology results: N/A  Thank you for allowing pharmacy to be a part of this patient's care.  Rexford Maus, PharmD, BCPS 03/17/2023 8:44 AM

## 2023-03-17 NOTE — Progress Notes (Signed)
PROGRESS NOTE    Kristiona Savell  VZD:[id number] DOB: May 29, 1935 DOA: 03/16/2023 PCP: [name], [name]    Brief Narrative:  88 y.o. female with medical history significant of pancreatic carcinoma, hypertension, essential tremor, GERD, HLD, HTN, anemia  Admitted for  Biliary obstruction due to Pancreatic adenocarcinoma. MRCP pending   Assessment and Plan: Biliary obstruction [facility] is aware  -MRCP ordered complicated by the fact pt has pacemaker No fver /chills to sudgest infection at this time   Essential hypertension Continue Bystolic 2.5 mg in the morning and Norvasc 5 mg a day   Mixed hyperlipidemia -resume meds as able   Leukocytosis Chronic recurrent   Iron deficiency anemia - transfuse for hemoglobin below 7   Pancreatic carcinoma (HCC) Patient will need father follow-up with oncology hold Xeloda for today    Hyponatremia -stable   S/P placement of cardiac pacemaker Hx of pacemaker for bradycardia   Debility Would benefit from PT/OT eval prior to dc   AKI (acute kidney injury) (HCC) on CKD stage IIIa In the setting of dehydration will rehydrate and follow renal function -baseline Cr around 1.2       DVT prophylaxis: SCDs Start: 03/16/23 2023    Code Status: Full Code Family Communication: at bedside  Disposition Plan:  Level of care: Telemetry Medical Status is: Observation The patient will require care spanning > 2 midnights and should be moved to inpatient     Consultants:  GI   Subjective: Sleeping- per daughter at bedside patient is normally A+Ox3 (perhaps date wrong) but here remains confused  Objective: Vitals:   03/16/23 1945 03/16/23 2027 03/17/23 0520 03/17/23 0833  BP: (!) 141/80 129/85 (!) 147/75 138/64  Pulse: 79 85 89 74  Resp: 16 18 20 17   Temp: 97.9 F (36.6 C) 98.1 F (36.7 C) 98.4 F (36.9 C) (!) 97.4 F (36.3 C)  TempSrc: Oral Oral Oral   SpO2: 100% 98% 98% 99%  Weight: 58.1 kg     Height: 5\' 6"  (1.676 m)        Intake/Output Summary (Last 24 hours) at 03/17/2023 1016 Last data filed at 03/17/2023 0355 Gross per 24 hour  Intake 414.96 ml  Output --  Net 414.96 ml   Filed Weights   03/16/23 1653 03/16/23 1945  Weight: 58.1 kg 58.1 kg    Examination:   General: Appearance:    Elderly, slightly jaundiced female in no acute distress     Lungs:     respirations unlabored  Heart:    Normal heart rate. Normal rhythm.    MS:   All extremities are intact.    Neurologic:   sleeping       Data Reviewed: I have personally reviewed following labs and imaging studies  CBC: Recent Labs  Lab 03/12/23 1446 03/16/23 1704 03/17/23 0706  WBC 13.3* 16.9* 14.1*  NEUTROABS 12.3* 14.4*  --   HGB 9.3* 8.5* 7.2*  HCT 28.5* 26.1* 21.3*  MCV 100.0 100.4* 96.8  PLT 491* 385 331   Basic Metabolic Panel: Recent Labs  Lab 03/12/23 1446 03/16/23 1704 03/16/23 2146 03/17/23 0706  NA 130* 131*  --  131*  K 4.0 4.0  --  3.7  CL 98 103  --  103  CO2 20* 18*  --  18*  GLUCOSE 199* 130*  --  109*  BUN 37* 33*  --  30*  CREATININE 1.98* 1.92*  --  1.82*  CALCIUM 9.7 9.1  --  8.9  MG 2.1  --  2.1 2.0  PHOS  --   --  2.3* 3.9   GFR: Estimated Creatinine Clearance: 20.4 mL/min (A) (by C-G formula based on SCr of 1.82 mg/dL (H)). Liver Function Tests: Recent Labs  Lab 03/12/23 1446 03/16/23 1327 03/16/23 1704 03/17/23 0706  AST 44* 41 40 34  ALT 62* 64* 63* 53*  ALKPHOS 678* 570* 609* 545*  BILITOT 1.2 4.4* 4.6* 3.6*  PROT 7.0 6.1* 6.4* 5.4*  ALBUMIN 2.5* 2.0* 1.9* 1.6*   Recent Labs  Lab 03/16/23 1704  LIPASE 26   No results for input(s): "AMMONIA" in the last 168 hours. Coagulation Profile: Recent Labs  Lab 03/16/23 1704  INR 1.3*   Cardiac Enzymes: Recent Labs  Lab 03/16/23 2146  CKTOTAL 92   BNP (last 3 results) No results for input(s): "PROBNP" in the last 8760 hours. HbA1C: No results for input(s): "HGBA1C" in the last 72 hours. CBG: No results for  input(s): "GLUCAP" in the last 168 hours. Lipid Profile: No results for input(s): "CHOL", "HDL", "LDLCALC", "TRIG", "CHOLHDL", "LDLDIRECT" in the last 72 hours. Thyroid Function Tests: Recent Labs    03/16/23 2146  TSH 0.854   Anemia Panel: Recent Labs    03/16/23 2146 03/17/23 0706  VITAMINB12  --  1,667*  FOLATE  --  20.3  FERRITIN 702*  --   TIBC 203*  --   IRON 37  --   RETICCTPCT 2.7  --    Sepsis Labs: Recent Labs  Lab 03/16/23 2146  PROCALCITON 5.96    No results found for this or any previous visit (from the past 240 hour(s)).       Radiology Studies: DG Chest Port 1 View  Result Date: 03/16/2023 CLINICAL DATA:  Fatigue. EXAM: PORTABLE CHEST 1 VIEW COMPARISON:  Chest radiograph dated 05/07/2022 and CT dated 12/07/2022. FINDINGS: Right-sided Port-A-Cath with tip close to the cavoatrial junction. There is diffuse chronic interstitial coarsening. No focal consolidation, pleural effusion, or pneumothorax. The cardiac silhouette is within normal limits. Atherosclerotic calcification of the aorta. Osteopenia with degenerative changes of spine. No acute osseous pathology. IMPRESSION: 1. No active disease. 2. Chronic interstitial coarsening. Electronically Signed   By: Elgie Collard M.D.   On: 03/16/2023 22:35        Scheduled Meds:  amLODipine  5 mg Oral Daily   Chlorhexidine Gluconate Cloth  6 each Topical Daily   feeding supplement  237 mL Oral BID BM   gabapentin  100 mg Oral BID   influenza vaccine adjuvanted  0.5 mL Intramuscular Tomorrow-1000   nebivolol  2.5 mg Oral Daily   pantoprazole  40 mg Oral Daily   Continuous Infusions:  piperacillin-tazobactam (ZOSYN)  IV 3.375 g (03/17/23 0900)     LOS: 0 days    Time spent: 45 minutes spent on chart review, discussion with nursing staff, consultants, updating family and interview/physical exam; more than 50% of that time was spent in counseling and/or coordination of care.    Joseph Art,  DO [facility] Available via Epic secure chat 7am-7pm After these hours, please refer to coverage provider listed on [email or web address] 03/17/2023, 10:16 AM

## 2023-03-17 NOTE — Progress Notes (Signed)
   03/17/23 1117  Mobility  Activity Ambulated with assistance to bathroom  Level of Assistance Minimal assist, patient does 75% or more (MaxA for Bed Mob, ModA for STS)  Assistive Device Front wheel walker  Distance Ambulated (ft) 20 ft  Activity Response Tolerated well  Mobility Referral Yes  $Mobility charge 1 Mobility  Mobility Specialist Start Time (ACUTE ONLY) 1054  Mobility Specialist Stop Time (ACUTE ONLY) 1108  Mobility Specialist Time Calculation (min) (ACUTE ONLY) 14 min   Mobility Specialist: Progress Note  RN requested assistance with pt. Pt agreeable to mobility session - received in bed. Required MaxA for bed mobility, ModA for STS and MinA for ambulation using RW. Pt was asymptomatic throughout session with no complaints. Returned to bed with all needs met - call bell within reach. Daughter present.  Pt stated "How did I get this on" referring to gown. Daughter stated she asks this question repeatedly.   Barnie Mort, BS Mobility Specialist Please contact via SecureChat or Rehab office at [phone].

## 2023-03-17 NOTE — Evaluation (Addendum)
Physical Therapy Evaluation Patient Details Name: Meagan [name] MRN: [id number] DOB: [birth year] Today's Date: 03/17/2023  History of Present Illness  88 y.o. female with medical history significant of pancreatic carcinoma, hypertension, essential tremor, GERD, HLD, HTN, anemia. Admitted for  Biliary obstruction due to Pancreatic adenocarcinoma.  Clinical Impression  Pt presents with admitting diagnosis above. Pt today was able to ambulate short distance in hallway with RW CGA. PTA pt daughter reports that pt uses a rollator at home but requires assistance from family for mobility and ADLs due to cognition and recent history of falls. It appears that pt has good family support at home and appears close to her baseline for mobility so recommend pt continue with HHPT and RW for DME. PT will continue to follow.       If plan is discharge home, recommend the following: A little help with walking and/or transfers;A little help with bathing/dressing/bathroom;Assistance with cooking/housework;Direct supervision/assist for medications management;Direct supervision/assist for financial management;Assist for transportation;Help with stairs or ramp for entrance;Supervision due to cognitive status   Can travel by private vehicle        Equipment Recommendations Rolling walker (2 wheels)  Recommendations for Other Services       Functional Status Assessment Patient has had a recent decline in their functional status and demonstrates the ability to make significant improvements in function in a reasonable and predictable amount of time.     Precautions / Restrictions Precautions Precautions: Fall Restrictions Weight Bearing Restrictions: No      Mobility  Bed Mobility Overal bed mobility: Needs Assistance Bed Mobility: Supine to Sit, Sit to Supine     Supine to sit: Contact guard, HOB elevated, Used rails Sit to supine: Contact guard assist, HOB elevated, Used rails   General bed  mobility comments: CGA for safety    Transfers Overall transfer level: Needs assistance Equipment used: Rolling walker (2 wheels) Transfers: Sit to/from Stand Sit to Stand: Contact guard assist           General transfer comment: CGA for safety. Cues for hand placement    Ambulation/Gait Ambulation/Gait assistance: Contact guard assist Gait Distance (Feet): 35 Feet Assistive device: Rolling walker (2 wheels) Gait Pattern/deviations: Trunk flexed, Decreased stride length, Step-through pattern, Narrow base of support, Drifts right/left Gait velocity: Decreased     General Gait Details: Pt with slow somewhat steady gait. No overt LOB however tended to drift.  Stairs            Wheelchair Mobility     Tilt Bed    Modified Rankin (Stroke Patients Only)       Balance Overall balance assessment: Needs assistance Sitting-balance support: Bilateral upper extremity supported, Feet supported Sitting balance-Leahy Scale: Fair Sitting balance - Comments: Posterior lean upon sitting Postural control: Posterior lean Standing balance support: Bilateral upper extremity supported, During functional activity Standing balance-Leahy Scale: Poor Standing balance comment: Reliant on RW                             Pertinent Vitals/Pain Pain Assessment Pain Assessment: No/denies pain    Home Living Family/patient expects to be discharged to:: Private residence Living Arrangements: Alone Available Help at Discharge: Family;Available PRN/intermittently Type of Home: House Home Access: Stairs to enter Entrance Stairs-Rails: None Entrance Stairs-Number of Steps: 1   Home Layout: One level Home Equipment: Patent examiner (4 wheels);Cane - single point;Wheelchair - manual;Shower seat - built in;Grab bars - tub/shower;Hand held shower head  Prior Function Prior Level of Function : Needs assist;History of Falls (last six months) (3 falls in the last 6  months but no injuries)             Mobility Comments: Daughter reports that she needs assistance with ambulating with rollator since finishing radiation in August. ADLs Comments: Family assists with ADLs     Extremity/Trunk Assessment   Upper Extremity Assessment Upper Extremity Assessment: Generalized weakness    Lower Extremity Assessment Lower Extremity Assessment: Generalized weakness    Cervical / Trunk Assessment Cervical / Trunk Assessment: Kyphotic;Other exceptions Cervical / Trunk Exceptions: Scoliosis  Communication   Communication Communication: No apparent difficulties Cueing Techniques: Verbal cues;Tactile cues  Cognition Arousal: Alert Behavior During Therapy: Flat affect, WFL for tasks assessed/performed Overall Cognitive Status: History of cognitive impairments - at baseline                                 General Comments: Pt not oriented to place and DOB however pt daughter reports this has been recent baseline since cancer treatment.        General Comments General comments (skin integrity, edema, etc.): VSS    Exercises     Assessment/Plan    PT Assessment Patient needs continued PT services  PT Problem List Decreased strength;Decreased range of motion;Decreased activity tolerance;Decreased balance;Decreased mobility;Decreased knowledge of use of DME;Decreased safety awareness;Decreased knowledge of precautions;Decreased cognition;Decreased coordination;Pain       PT Treatment Interventions DME instruction;Gait training;Stair training;Functional mobility training;Therapeutic activities;Therapeutic exercise;Neuromuscular re-education;Balance training;Cognitive remediation;Patient/family education    PT Goals (Current goals can be found in the Care Plan section)  Acute Rehab PT Goals Patient Stated Goal: to go home PT Goal Formulation: With patient Time For Goal Achievement: 03/31/23 Potential to Achieve Goals: Fair     Frequency Min 1X/week     Co-evaluation               AM-PAC PT "6 Clicks" Mobility  Outcome Measure Help needed turning from your back to your side while in a flat bed without using bedrails?: A Little Help needed moving from lying on your back to sitting on the side of a flat bed without using bedrails?: A Little Help needed moving to and from a bed to a chair (including a wheelchair)?: A Little Help needed standing up from a chair using your arms (e.g., wheelchair or bedside chair)?: A Little Help needed to walk in hospital room?: A Little Help needed climbing 3-5 steps with a railing? : A Lot 6 Click Score: 17    End of Session Equipment Utilized During Treatment: Gait belt Activity Tolerance: Patient tolerated treatment well Patient left: in bed;with call bell/phone within reach;with bed alarm set;with family/visitor present Nurse Communication: Mobility status PT Visit Diagnosis: Other abnormalities of gait and mobility (R26.89)    Time: 4098-1191 PT Time Calculation (min) (ACUTE ONLY): 25 min   Charges:   PT Evaluation $PT Eval Moderate Complexity: 1 Mod PT Treatments $Gait Training: 8-22 mins PT General Charges $$ ACUTE PT VISIT: 1 Visit         Shela Nevin, PT, DPT Acute Rehab Services [phone]   Gladys Damme 03/17/2023, 3:36 PM

## 2023-03-17 NOTE — Care Management Obs Status (Signed)
MEDICARE OBSERVATION STATUS NOTIFICATION   Patient Details  Name: Meagan [name] MRN: [id number] Date of Birth: 1935/04/18   Medicare Observation Status Notification Given:  Yes    Lawerance Sabal, RN 03/17/2023, 8:05 AM

## 2023-03-17 NOTE — Evaluation (Signed)
Occupational Therapy Evaluation Patient Details Name: Meagan [name] MRN: [id number] DOB: Sep 16, 1934 Today's Date: 03/17/2023   History of Present Illness 88 yo female admitted with biliary obstruction due to pancreatic adenocarcinoma PMH pancreatic carcinoma, HTN essential tremor GERD HLD HTN Anemia   Clinical Impression   PT admitted with biliary obstruction. Pt currently with functional limitiations due to the deficits listed below (see OT problem list). Pt with family support to return home with [facility] services. Pt demonstrates balance and cognitive deficits.  Pt will benefit from skilled OT to increase their independence and safety with adls and balance to allow discharge hhot.        If plan is discharge home, recommend the following:      Functional Status Assessment  Patient has had a recent decline in their functional status and demonstrates the ability to make significant improvements in function in a reasonable and predictable amount of time.  Equipment Recommendations  None recommended by OT    Recommendations for Other Services       Precautions / Restrictions Precautions Precautions: Fall Restrictions Weight Bearing Restrictions: No      Mobility Bed Mobility Overal bed mobility: Needs Assistance Bed Mobility: Supine to Sit, Sit to Supine, Rolling Rolling: Min assist   Supine to sit: Mod assist Sit to supine: Mod assist   General bed mobility comments: help to elevate trunk off bed surface    Transfers Overall transfer level: Needs assistance Equipment used: Rolling walker (2 wheels) Transfers: Sit to/from Stand Sit to Stand: Contact guard assist           General transfer comment: cues for rw safety      Balance Overall balance assessment: Needs assistance Sitting-balance support: Bilateral upper extremity supported, Feet supported Sitting balance-Leahy Scale: Fair Sitting balance - Comments: Posterior lean upon sitting   Standing balance  support: Bilateral upper extremity supported, During functional activity, Reliant on assistive device for balance Standing balance-Leahy Scale: Poor Standing balance comment: Reliant on RW                           ADL either performed or assessed with clinical judgement   ADL Overall ADL's : Needs assistance/impaired Eating/Feeding: Supervision/ safety;Bed level Eating/Feeding Details (indicate cue type and reason): mild spillage in the bed Grooming: Wash/dry hands;Standing               Lower Body Dressing: Moderate assistance;Sit to/from stand Lower Body Dressing Details (indicate cue type and reason): needs cues to pull up the briefs Toilet Transfer: Contact guard assist;BSC/3in1   Toileting- Clothing Manipulation and Hygiene: Contact guard assist       Functional mobility during ADLs: Minimal assistance;Rolling walker (2 wheels) General ADL Comments: cues for sequence     Vision Patient Visual Report: No change from baseline       Perception         Praxis         Pertinent Vitals/Pain Pain Assessment Pain Assessment: No/denies pain     Extremity/Trunk Assessment Upper Extremity Assessment Upper Extremity Assessment: Generalized weakness   Lower Extremity Assessment Lower Extremity Assessment: Generalized weakness   Cervical / Trunk Assessment Cervical / Trunk Assessment: Kyphotic Cervical / Trunk Exceptions: scoliosis   Communication Communication Communication: No apparent difficulties Cueing Techniques: Verbal cues;Tactile cues   Cognition Arousal: Alert Behavior During Therapy: Flat affect, WFL for tasks assessed/performed Overall Cognitive Status: History of cognitive impairments - at baseline  General Comments: able to recall in hospital     General Comments  VSS, pt noted to have precautions listed on lunch tray but not in chart    Exercises     Shoulder Instructions       Home Living Family/patient expects to be discharged to:: Private residence Living Arrangements: Alone Available Help at Discharge: Family;Available PRN/intermittently Type of Home: House Home Access: Stairs to enter Entrance Stairs-Number of Steps: 1 Entrance Stairs-Rails: None Home Layout: One level     Bathroom Shower/Tub: Producer, television/film/video: Standard Bathroom Accessibility: Yes   Home Equipment: Patent examiner (4 wheels);Cane - single point;Wheelchair - manual;Shower seat - built in;Grab bars - tub/shower;Hand held shower head   Additional Comments: daughter and grandson present and said that they feel they can manage at current level with caregiver support      Prior Functioning/Environment Prior Level of Function : Needs assist;History of Falls (last six months)             Mobility Comments: Daughter reports that she needs assistance with ambulating with rollator since finishing radiation in August. ADLs Comments: Family assists with ADLs        OT Problem List: Decreased strength;Decreased activity tolerance;Impaired balance (sitting and/or standing);Decreased cognition;Decreased safety awareness;Decreased knowledge of use of DME or AE;Decreased knowledge of precautions      OT Treatment/Interventions: Self-care/ADL training;Energy conservation;DME and/or AE instruction;Therapeutic activities;Cognitive remediation/compensation;Patient/family education;Balance training;Therapeutic exercise    OT Goals(Current goals can be found in the care plan section) Acute Rehab OT Goals Patient Stated Goal: to return home OT Goal Formulation: With patient/family Time For Goal Achievement: 03/31/23 Potential to Achieve Goals: Good  OT Frequency: Min 1X/week    Co-evaluation              AM-PAC OT "6 Clicks" Daily Activity     Outcome Measure Help from another person eating meals?: None Help from another person taking care of personal  grooming?: None Help from another person toileting, which includes using toliet, bedpan, or urinal?: A Little Help from another person bathing (including washing, rinsing, drying)?: A Little Help from another person to put on and taking off regular upper body clothing?: None Help from another person to put on and taking off regular lower body clothing?: A Little 6 Click Score: 21   End of Session Equipment Utilized During Treatment: Gait belt;Rolling walker (2 wheels) Nurse Communication: Mobility status;Precautions  Activity Tolerance: Patient tolerated treatment well Patient left: in bed;with call bell/phone within reach;with bed alarm set;with family/visitor present  OT Visit Diagnosis: Unsteadiness on feet (R26.81);Muscle weakness (generalized) (M62.81)                Time: 1610-9604 OT Time Calculation (min): 32 min Charges:  OT General Charges $OT Visit: 1 Visit OT Evaluation $OT Eval Moderate Complexity: 1 Mod   Brynn, OTR/L  Acute Rehabilitation Services Office: [phone] .   Mateo Flow 03/17/2023, 5:56 PM

## 2023-03-17 NOTE — TOC Initial Note (Signed)
Transition of Care [facility]) - Initial/Assessment Note    Patient Details  Name: Meagan [name] MRN: [id number] Date of Birth: 06/12/1934  Transition of Care [facility]) CM/SW Contact:    Kingsley Plan, RN Phone Number: 03/17/2023, 4:06 PM  Clinical Narrative:                  Spoke to patient and family at bedside. Family take turns staying with patient at home. Patient has a rollator at home. PT recommended rolling walker, they have one at home.   PT recommending HHPT. Patient already has [facility]PT [facility] [phone]. NCM called same left message, they close at 4 pm. Will follow up tomorrow   Expected Discharge Plan: Home w Home Health Services Barriers to Discharge: Continued Medical Work up   Patient Goals and CMS Choice Patient states their goals for this hospitalization and ongoing recovery are:: to return to home CMS Medicare.gov Compare Post Acute Care list provided to:: Patient Represenative (must comment) (daughter) Choice offered to / list presented to : Adult Children      Expected Discharge Plan and Services   Discharge Planning Services: CM Consult Post Acute Care Choice: Home Health Living arrangements for the past 2 months: Single Family Home                 DME Arranged: N/A         HH Arranged: PT HH Agency:  (see note)        Prior Living Arrangements/Services Living arrangements for the past 2 months: Single Family Home Lives with:: Self Patient language and need for interpreter reviewed:: Yes Do you feel safe going back to the place where you live?: Yes      Need for Family Participation in Patient Care: Yes (Comment) Care giver support system in place?: Yes (comment) Current home services: DME Criminal Activity/Legal Involvement Pertinent to Current Situation/Hospitalization: No - Comment as needed  Activities of Daily Living   ADL Screening (condition at time of admission) Independently performs ADLs?: No Does the  patient have a NEW difficulty with bathing/dressing/toileting/self-feeding that is expected to last >3 days?: Yes (Initiates electronic notice to provider for possible OT consult) (weak needs help bathing and dressing) Does the patient have a NEW difficulty with getting in/out of bed, walking, or climbing stairs that is expected to last >3 days?: Yes (Initiates electronic notice to provider for possible PT consult) (weak) Does the patient have a NEW difficulty with communication that is expected to last >3 days?: No Is the patient deaf or have difficulty hearing?: Yes Does the patient have difficulty seeing, even when wearing glasses/contacts?: No Does the patient have difficulty concentrating, remembering, or making decisions?: Yes  Permission Sought/Granted   Permission granted to share information with : Yes, Verbal Permission Granted  Share Information with NAME: daughter Maralyn Sago  Permission granted to share info w AGENCY[facility]        Emotional Assessment Appearance:: Appears stated age Attitude/Demeanor/Rapport: Engaged Affect (typically observed): Accepting Orientation: : Oriented to Self Alcohol / Substance Use: Not Applicable Psych Involvement: No (comment)  Admission diagnosis:  Biliary obstruction [K83.1] Pancreatic adenocarcinoma (HCC) [C25.9] Patient Active Problem List   Diagnosis Date Noted   Biliary obstruction 03/16/2023   Hyponatremia 03/16/2023   S/P placement of cardiac pacemaker 03/16/2023   Debility 03/16/2023   AKI (acute kidney injury) (HCC) 03/16/2023   CKD (chronic kidney disease), stage III (HCC) 03/16/2023   Iron deficiency  anemia 06/22/2022   Biliary stent obstruction 05/07/2022   Acute metabolic encephalopathy 05/07/2022   Genetic testing 02/11/2022   Port-A-Cath in place 01/27/2022   Pancreatic adenocarcinoma (HCC) 11/11/2021   Hyperbilirubinemia 10/03/2021   Leukocytosis 10/03/2021   Common bile duct stricture    Bile duct  obstruction    Jaundice 09/07/2021   Gastroesophageal reflux disease 11/02/2017   Essential hypertension 09/15/2016   Mitral valve regurgitation 09/15/2016   Mixed hyperlipidemia 09/15/2016   PCP:  [name], [name] Pharmacy:   [facility] [place], [place] - [address] [place] [address] Phone: [phone] Fax: [phone]  [facility] [address] [place] [address] Phone: [phone] Fax: [phone]     Social Determinants of Health (SDOH) Social History: SDOH Screenings   Food Insecurity: No Food Insecurity (03/16/2023)  Housing: Low Risk  (03/16/2023)  Transportation Needs: No Transportation Needs (03/16/2023)  Utilities: Not At Risk (03/16/2023)  Tobacco Use: Low Risk  (03/16/2023)   SDOH Interventions:     Readmission Risk Interventions     No data to display

## 2023-03-17 NOTE — Progress Notes (Signed)
Initial Nutrition Assessment  DOCUMENTATION CODES:   Not applicable  INTERVENTION:  Once diet resumes, recommend: Regular diet to promote adequate PO intake Ensure Enlive po BID, each supplement provides 350 kcal and 20 grams of protein. Magic cup TID with meals, each supplement provides 290 kcal and 9 grams of protein  NUTRITION DIAGNOSIS:   Increased nutrient needs related to chronic illness, cancer and cancer related treatments as evidenced by estimated needs.  GOAL:   Patient will meet greater than or equal to 90% of their needs  MONITOR:   PO intake, Supplement acceptance, Labs, Weight trends  REASON FOR ASSESSMENT:   Consult Assessment of nutrition requirement/status  ASSESSMENT:   Pt admitted d/t elevated bilirubin concerning for bilary obstruction. PMH significant for pancreatic carcinoma, HTN, essential tremor, GERD, HLD, HTN and anemia.  Noted plans for MRCP and MRI today.   Pt sitting up in bed. Spoke with family member present at bedside who assisted with nutrition related history as pt noted to have increased confusion during admission. She reports that pt was doing well with nutrition and energy levels while receiving chemotherapy. Since the end of radiation in August, she has had a significant decrease in energy levels and willingness to eat or drink, though she feels that her PO intake has gradually been declining. Pt denies difficulty chewing/swallowing foods.   Pt typically lives alone but has had family staying with her around the clock over the last 2 weeks. They have had to constantly encourage PO intake. She has been consuming 2 meals per day with many snacks. She loved ice cream and will try to consume 1 Boost daily. She reports that pt has difficulty with reflux and d/t wanting to lay down more often, has had intermittent episodes (about 2 times weekly) of emesis. They do continue to encourage pt to sit up during and after meals.   No documented meal  completions on file to review at this time.   It is reported that pt's weight had been up to about 125 lbs last remembered in July. Suspect that her weight is now down to somewhere in 110-120 lbs. Reviewed documented meal completions on file. Pt's weight noted to have remained stable between 51-53 kg since 03/2022 however since 11/16/22, pt's weight appears to be +5.5 kg.   Medications: protonix  Labs: sodium 131, BUN 30, Cr 1.82, alkaline phos 545, ALT 53, t bili 3.6, GFR 27  NUTRITION - FOCUSED PHYSICAL EXAM:  Flowsheet Row Most Recent Value  Orbital Region No depletion  Upper Arm Region Moderate depletion  Thoracic and Lumbar Region No depletion  Buccal Region No depletion  Temple Region No depletion  Clavicle Bone Region Moderate depletion  Clavicle and Acromion Bone Region Mild depletion  Scapular Bone Region No depletion  Dorsal Hand Moderate depletion  Patellar Region Moderate depletion  Anterior Thigh Region Moderate depletion  Posterior Calf Region Moderate depletion  Edema (RD Assessment) None  Hair Reviewed  Eyes Other (Comment)  [jaundice]  Mouth Reviewed  Skin Reviewed  Nails Reviewed       Diet Order:   Diet Order             Diet NPO time specified  Diet effective midnight                   EDUCATION NEEDS:   Education needs have been addressed  Skin:  Skin Assessment: Reviewed RN Assessment  Last BM:  10/15  Height:   Ht Readings from Last  1 Encounters:  03/16/23 5\' 6"  (1.676 m)    Weight:   Wt Readings from Last 1 Encounters:  03/16/23 58.1 kg   BMI:  Body mass index is 20.67 kg/m.  Estimated Nutritional Needs:   Kcal:  1500-1700  Protein:  75-100g  Fluid:  >/=1.5L  Drusilla Kanner, RDN, LDN Clinical Nutrition

## 2023-03-18 ENCOUNTER — Observation Stay (HOSPITAL_COMMUNITY): Payer: Medicare HMO

## 2023-03-18 DIAGNOSIS — R54 Age-related physical debility: Secondary | ICD-10-CM | POA: Diagnosis present

## 2023-03-18 DIAGNOSIS — N179 Acute kidney failure, unspecified: Secondary | ICD-10-CM | POA: Diagnosis present

## 2023-03-18 DIAGNOSIS — R748 Abnormal levels of other serum enzymes: Secondary | ICD-10-CM | POA: Diagnosis present

## 2023-03-18 DIAGNOSIS — R17 Unspecified jaundice: Secondary | ICD-10-CM | POA: Diagnosis not present

## 2023-03-18 DIAGNOSIS — Z79899 Other long term (current) drug therapy: Secondary | ICD-10-CM | POA: Diagnosis not present

## 2023-03-18 DIAGNOSIS — K862 Cyst of pancreas: Secondary | ICD-10-CM | POA: Diagnosis present

## 2023-03-18 DIAGNOSIS — I129 Hypertensive chronic kidney disease with stage 1 through stage 4 chronic kidney disease, or unspecified chronic kidney disease: Secondary | ICD-10-CM | POA: Diagnosis present

## 2023-03-18 DIAGNOSIS — K219 Gastro-esophageal reflux disease without esophagitis: Secondary | ICD-10-CM | POA: Diagnosis present

## 2023-03-18 DIAGNOSIS — K315 Obstruction of duodenum: Secondary | ICD-10-CM | POA: Diagnosis present

## 2023-03-18 DIAGNOSIS — C259 Malignant neoplasm of pancreas, unspecified: Secondary | ICD-10-CM | POA: Diagnosis present

## 2023-03-18 DIAGNOSIS — K831 Obstruction of bile duct: Secondary | ICD-10-CM | POA: Diagnosis present

## 2023-03-18 DIAGNOSIS — E860 Dehydration: Secondary | ICD-10-CM | POA: Diagnosis present

## 2023-03-18 DIAGNOSIS — Z923 Personal history of irradiation: Secondary | ICD-10-CM | POA: Diagnosis not present

## 2023-03-18 DIAGNOSIS — Z515 Encounter for palliative care: Secondary | ICD-10-CM | POA: Diagnosis not present

## 2023-03-18 DIAGNOSIS — Z7189 Other specified counseling: Secondary | ICD-10-CM | POA: Diagnosis not present

## 2023-03-18 DIAGNOSIS — D509 Iron deficiency anemia, unspecified: Secondary | ICD-10-CM | POA: Diagnosis present

## 2023-03-18 DIAGNOSIS — Z602 Problems related to living alone: Secondary | ICD-10-CM | POA: Diagnosis present

## 2023-03-18 DIAGNOSIS — K8309 Other cholangitis: Secondary | ICD-10-CM | POA: Diagnosis present

## 2023-03-18 DIAGNOSIS — Z66 Do not resuscitate: Secondary | ICD-10-CM | POA: Diagnosis not present

## 2023-03-18 DIAGNOSIS — Z87440 Personal history of urinary (tract) infections: Secondary | ICD-10-CM | POA: Diagnosis not present

## 2023-03-18 DIAGNOSIS — N1831 Chronic kidney disease, stage 3a: Secondary | ICD-10-CM | POA: Diagnosis present

## 2023-03-18 DIAGNOSIS — Z9221 Personal history of antineoplastic chemotherapy: Secondary | ICD-10-CM | POA: Diagnosis not present

## 2023-03-18 DIAGNOSIS — G250 Essential tremor: Secondary | ICD-10-CM | POA: Diagnosis present

## 2023-03-18 DIAGNOSIS — E782 Mixed hyperlipidemia: Secondary | ICD-10-CM | POA: Diagnosis present

## 2023-03-18 DIAGNOSIS — R160 Hepatomegaly, not elsewhere classified: Secondary | ICD-10-CM | POA: Diagnosis not present

## 2023-03-18 DIAGNOSIS — E871 Hypo-osmolality and hyponatremia: Secondary | ICD-10-CM | POA: Diagnosis present

## 2023-03-18 LAB — COMPREHENSIVE METABOLIC PANEL
ALT: 52 U/L — ABNORMAL HIGH (ref 0–44)
AST: 36 U/L (ref 15–41)
Albumin: 1.6 g/dL — ABNORMAL LOW (ref 3.5–5.0)
Alkaline Phosphatase: 585 U/L — ABNORMAL HIGH (ref 38–126)
Anion gap: 13 (ref 5–15)
BUN: 28 mg/dL — ABNORMAL HIGH (ref 8–23)
CO2: 19 mmol/L — ABNORMAL LOW (ref 22–32)
Calcium: 9.5 mg/dL (ref 8.9–10.3)
Chloride: 102 mmol/L (ref 98–111)
Creatinine, Ser: 1.95 mg/dL — ABNORMAL HIGH (ref 0.44–1.00)
GFR, Estimated: 25 mL/min — ABNORMAL LOW (ref >60)
Glucose, Bld: 93 mg/dL (ref 70–99)
Potassium: 3.8 mmol/L (ref 3.5–5.1)
Sodium: 134 mmol/L — ABNORMAL LOW (ref 135–145)
Total Bilirubin: 3.7 mg/dL — ABNORMAL HIGH (ref 0.3–1.2)
Total Protein: 6 g/dL — ABNORMAL LOW (ref 6.5–8.1)

## 2023-03-18 MED ORDER — SODIUM CHLORIDE 0.9% FLUSH: 10.0000 mL | INTRAVENOUS | Status: DC | PRN

## 2023-03-18 MED ORDER — PIPERACILLIN-TAZOBACTAM IN DEX 2-0.25 GM/50ML IV SOLN
2.2500 g | Freq: Three times a day (TID) | INTRAVENOUS | Status: DC
  Administered 2023-03-18 – 2023-03-24 (×18): 2.25 g via INTRAVENOUS
  Filled 2023-03-18 (×21): qty 50

## 2023-03-18 MED ORDER — SODIUM CHLORIDE 0.9 % IV SOLN: INTRAVENOUS | Status: DC

## 2023-03-18 MED ORDER — GADOBUTROL 1 MMOL/ML IV SOLN
5.0000 mL | Freq: Once | INTRAVENOUS | Status: AC | PRN
  Administered 2023-03-18: 5 mL via INTRAVENOUS

## 2023-03-18 MED ORDER — LORATADINE 10 MG PO TABS: 10.0000 mg | ORAL_TABLET | Freq: Once | ORAL | Status: DC

## 2023-03-18 NOTE — Progress Notes (Signed)
PHARMACY NOTE:  ANTIMICROBIAL RENAL DOSAGE ADJUSTMENT  Current antimicrobial regimen includes a mismatch between antimicrobial dosage and estimated renal function.  As per policy approved by the Pharmacy & Therapeutics and Medical Executive Committees, the antimicrobial dosage will be adjusted accordingly.  Current antimicrobial dosage:  Zosyn 3.375g IV q8 hours (extended infusion)  Indication: intra-abdominal infection  Renal Function:  Estimated Creatinine Clearance: 19 mL/min (A) (by C-G formula based on SCr of 1.95 mg/dL (H)). []      On intermittent HD, scheduled: []      On CRRT    Antimicrobial dosage has been changed to:  Zosyn 2.25g IV q8 hours  Additional comments:   Thank you for allowing pharmacy to be a part of this patient's care.  Rexford Maus, PharmD, BCPS 03/18/2023 8:27 AM

## 2023-03-18 NOTE — H&P (View-Only) (Signed)
Attending physician's note   I have taken a history, reviewed the chart, and examined the patient. I performed a substantive portion of this encounter, including complete performance of at least one of the key components, in conjunction with the APP. I agree with the APP's note, impression, and recommendations with my edits.   I reviewed the MRI/MRCP results with family members at bedside and patient.  Interval development of 4.8 x 3.8 cm mass superiorly in the left hepatic lobe, mild intrahepatic biliary dilation in the left lobe without significant biliary dilatation or concerning features in the right hepatic lobe.  Metal CBD stent in place, but difficult to discern stent patency.  GB otherwise unremarkable.  1.8 x 1.1 cm multiseptated cystic lesion in the pancreatic head with mild PD dilation.  Discussed case with the advanced biliary service.  Will plan on proceeding with ERCP tomorrow for further evaluation and potential additional stent placement.  Depending on ERCP findings, I did briefly discuss the potential role for IR consult for percutaneous stent placement, but will await ERCP findings first.  All questions answered to the best my ability.  N.p.o. at midnight for procedures tomorrow.   Visente Kirker, DO, FACG [phone] office         [facility] Progress Note  CC:  biliary obstruction, pancreatic adeno   Subjective:  Feels the same, no abdominal pain.  Objective:  Vital signs in last 24 hours: Temp:  [98.6 F (37 C)-98.9 F (37.2 C)] 98.9 F (37.2 C) (10/17 0818) Pulse Rate:  [74-81] 76 (10/17 0818) Resp:  [17-19] 17 (10/17 0818) BP: (139-145)/(68-79) 145/79 (10/17 0818) SpO2:  [99 %-100 %] 100 % (10/17 0818) Last BM Date : 03/16/23 General:  Alert, elderly, in NAD Heart:  Regular rate and rhythm; no murmurs Pulm:  CTAB.  No W/R/R. Abdomen:  Soft, non-distended.  BS present.  Non-tender. Extremities:  Without edema. Neurologic:  Alert  and oriented x 4;  grossly normal neurologically. Psych:  Alert and cooperative. Normal mood and affect.  Intake/Output from previous day: 10/16 0701 - 10/17 0700 In: 751.6 [P.O.:600; IV Piggyback:151.6] Out: 0   Lab Results: Recent Labs    03/16/23 1704 03/17/23 0706  WBC 16.9* 14.1*  HGB 8.5* 7.2*  HCT 26.1* 21.3*  PLT 385 331   BMET Recent Labs    03/16/23 1704 03/17/23 0706 03/18/23 0657  NA 131* 131* 134*  K 4.0 3.7 3.8  CL 103 103 102  CO2 18* 18* 19*  GLUCOSE 130* 109* 93  BUN 33* 30* 28*  CREATININE 1.92* 1.82* 1.95*  CALCIUM 9.1 8.9 9.5   LFT Recent Labs    03/16/23 1327 03/16/23 1704 03/18/23 0657  PROT 6.1*   < > 6.0*  ALBUMIN 2.0*   < > 1.6*  AST 41   < > 36  ALT 64*   < > 52*  ALKPHOS 570*   < > 585*  BILITOT 4.4*   < > 3.7*  BILIDIR 3.3*  --   --   IBILI 1.1*  --   --    < > = values in this interval not displayed.   PT/INR Recent Labs    03/16/23 1704  LABPROT 16.1*  INR 1.3*   DG Chest Port 1 View  Result Date: 03/16/2023 CLINICAL DATA:  Fatigue. EXAM: PORTABLE CHEST 1 VIEW COMPARISON:  Chest radiograph dated 05/07/2022 and CT dated 12/07/2022. FINDINGS: Right-sided Port-A-Cath with tip close to the cavoatrial junction. There is diffuse  chronic interstitial coarsening. No focal consolidation, pleural effusion, or pneumothorax. The cardiac silhouette is within normal limits. Atherosclerotic calcification of the aorta. Osteopenia with degenerative changes of spine. No acute osseous pathology. IMPRESSION: 1. No active disease. 2. Chronic interstitial coarsening. Electronically Signed   By: Elgie Collard M.D.   On: 03/16/2023 22:35    Assessment / Plan: *88 year old female with pancreatic adenocarcinoma status post biliary stents last in 05/2022.  Has completed chemo and radiation per her report.  Last imaging July.  CA 19-9 higher than previous on recheck last week.  Now with increasing LFTs again, suspect recurrent biliary  obstruction/stent occlusion.  MRI/MRCP was performed this AM and results are pending.  -Await results of MRI/MRCP. -Plans for ERCP and repeat stenting pending those results, but planning tentatively for 10/18. -Trend LFTs.    LOS: 0 days   Princella Pellegrini. Zehr  03/18/2023, 9:32 AM

## 2023-03-18 NOTE — Progress Notes (Addendum)
Attending physician's note   I have taken a history, reviewed the chart, and examined the patient. I performed a substantive portion of this encounter, including complete performance of at least one of the key components, in conjunction with the APP. I agree with the APP's note, impression, and recommendations with my edits.   I reviewed the MRI/MRCP results with family members at bedside and patient.  Interval development of 4.8 x 3.8 cm mass superiorly in the left hepatic lobe, mild intrahepatic biliary dilation in the left lobe without significant biliary dilatation or concerning features in the right hepatic lobe.  Metal CBD stent in place, but difficult to discern stent patency.  GB otherwise unremarkable.  1.8 x 1.1 cm multiseptated cystic lesion in the pancreatic head with mild PD dilation.  Discussed case with the advanced biliary service.  Will plan on proceeding with ERCP tomorrow for further evaluation and potential additional stent placement.  Depending on ERCP findings, I did briefly discuss the potential role for IR consult for percutaneous stent placement, but will await ERCP findings first.  All questions answered to the best my ability.  N.p.o. at midnight for procedures tomorrow.   Visente Kirker, DO, FACG [phone] office         [facility] Progress Note  CC:  biliary obstruction, pancreatic adeno   Subjective:  Feels the same, no abdominal pain.  Objective:  Vital signs in last 24 hours: Temp:  [98.6 F (37 C)-98.9 F (37.2 C)] 98.9 F (37.2 C) (10/17 0818) Pulse Rate:  [74-81] 76 (10/17 0818) Resp:  [17-19] 17 (10/17 0818) BP: (139-145)/(68-79) 145/79 (10/17 0818) SpO2:  [99 %-100 %] 100 % (10/17 0818) Last BM Date : 03/16/23 General:  Alert, elderly, in NAD Heart:  Regular rate and rhythm; no murmurs Pulm:  CTAB.  No W/R/R. Abdomen:  Soft, non-distended.  BS present.  Non-tender. Extremities:  Without edema. Neurologic:  Alert  and oriented x 4;  grossly normal neurologically. Psych:  Alert and cooperative. Normal mood and affect.  Intake/Output from previous day: 10/16 0701 - 10/17 0700 In: 751.6 [P.O.:600; IV Piggyback:151.6] Out: 0   Lab Results: Recent Labs    03/16/23 1704 03/17/23 0706  WBC 16.9* 14.1*  HGB 8.5* 7.2*  HCT 26.1* 21.3*  PLT 385 331   BMET Recent Labs    03/16/23 1704 03/17/23 0706 03/18/23 0657  NA 131* 131* 134*  K 4.0 3.7 3.8  CL 103 103 102  CO2 18* 18* 19*  GLUCOSE 130* 109* 93  BUN 33* 30* 28*  CREATININE 1.92* 1.82* 1.95*  CALCIUM 9.1 8.9 9.5   LFT Recent Labs    03/16/23 1327 03/16/23 1704 03/18/23 0657  PROT 6.1*   < > 6.0*  ALBUMIN 2.0*   < > 1.6*  AST 41   < > 36  ALT 64*   < > 52*  ALKPHOS 570*   < > 585*  BILITOT 4.4*   < > 3.7*  BILIDIR 3.3*  --   --   IBILI 1.1*  --   --    < > = values in this interval not displayed.   PT/INR Recent Labs    03/16/23 1704  LABPROT 16.1*  INR 1.3*   DG Chest Port 1 View  Result Date: 03/16/2023 CLINICAL DATA:  Fatigue. EXAM: PORTABLE CHEST 1 VIEW COMPARISON:  Chest radiograph dated 05/07/2022 and CT dated 12/07/2022. FINDINGS: Right-sided Port-A-Cath with tip close to the cavoatrial junction. There is diffuse  chronic interstitial coarsening. No focal consolidation, pleural effusion, or pneumothorax. The cardiac silhouette is within normal limits. Atherosclerotic calcification of the aorta. Osteopenia with degenerative changes of spine. No acute osseous pathology. IMPRESSION: 1. No active disease. 2. Chronic interstitial coarsening. Electronically Signed   By: Elgie Collard M.D.   On: 03/16/2023 22:35    Assessment / Plan: *88 year old female with pancreatic adenocarcinoma status post biliary stents last in 05/2022.  Has completed chemo and radiation per her report.  Last imaging July.  CA 19-9 higher than previous on recheck last week.  Now with increasing LFTs again, suspect recurrent biliary  obstruction/stent occlusion.  MRI/MRCP was performed this AM and results are pending.  -Await results of MRI/MRCP. -Plans for ERCP and repeat stenting pending those results, but planning tentatively for 10/18. -Trend LFTs.    LOS: 0 days   Princella Pellegrini. Zehr  03/18/2023, 9:32 AM

## 2023-03-18 NOTE — TOC Progression Note (Signed)
Transition of Care (TOC) - Progression Note   Spoke with Chassidy with [facility] home [facility] [phone] . Faxed orders to Chassidy at [phone] . Chassidy asking for [facility] [place] to call when patient discharged  Patient Details  Name: Meagan [name] MRN: [id number] Date of Birth: 1934/10/24  Transition of Care [facility]) CM/SW Contact  Kemond Amorin, Adria Devon, RN Phone Number: 03/18/2023, 2:09 PM  Clinical Narrative:       Expected Discharge Plan: Home w Home Health Services Barriers to Discharge: Continued Medical Work up  Expected Discharge Plan and Services   Discharge Planning Services: CM Consult Post Acute Care Choice: Home Health Living arrangements for the past 2 months: Single Family Home                 DME Arranged: N/A         HH Arranged: PT HH Agency:  (see note)         Social Determinants of Health (SDOH) Interventions SDOH Screenings   Food Insecurity: No Food Insecurity (03/16/2023)  Housing: Low Risk  (03/16/2023)  Transportation Needs: No Transportation Needs (03/16/2023)  Utilities: Not At Risk (03/16/2023)  Tobacco Use: Low Risk  (03/16/2023)    Readmission Risk Interventions     No data to display

## 2023-03-18 NOTE — Progress Notes (Signed)
Mobility Specialist: Progress Note   03/18/23 1509  Mobility  Activity Ambulated with assistance to bathroom  Level of Assistance Contact guard assist, steadying assist  Assistive Device Front wheel walker  Distance Ambulated (ft) 30 ft  Activity Response Tolerated well  Mobility Referral Yes  $Mobility charge 1 Mobility  Mobility Specialist Start Time (ACUTE ONLY) 1135  Mobility Specialist Stop Time (ACUTE ONLY) 1159  Mobility Specialist Time Calculation (min) (ACUTE ONLY) 24 min    Pt was agreeable to mobility session - received in bed. No complaints but still very tired from medication. Requested to use BR. CG throughout. Completed void and performed pericare independently in standing position. Left in bed with all needs met, call bell in reach.   Maurene Capes Mobility Specialist Please contact via SecureChat or Rehab office at [phone]

## 2023-03-18 NOTE — Progress Notes (Signed)
PROGRESS NOTE    Meagan [name]  BTD:[id number] DOB: Mar 09, 1935 DOA: 03/16/2023 PCP: [name], [name]    Brief Narrative:  88 y.o. female with medical history significant of pancreatic carcinoma, hypertension, essential tremor, GERD, HLD, HTN, anemia  Admitted for  Biliary obstruction due to Pancreatic adenocarcinoma. MRCP pending   Assessment and Plan: Biliary obstruction [facility] is aware  -MRCP ordered complicated by the fact pt has pacemaker No fever /chills to suggest infection at this time but pro calcitonin is elevated so continue zosyn   Essential hypertension Continue Bystolic 2.5 mg in the morning and Norvasc 5 mg a day   Mixed hyperlipidemia -resume meds as able   Leukocytosis Chronic recurrent   Iron deficiency anemia - transfuse for hemoglobin below 7   Pancreatic carcinoma (HCC) Patient will need father follow-up with oncology hold Xeloda for today    Hyponatremia -stable   S/P placement of cardiac pacemaker Hx of pacemaker for bradycardia   Debility Would benefit from PT/OT eval prior to dc   AKI (acute kidney injury) (HCC) on CKD stage IIIa In the setting of dehydration will rehydrate and follow renal function -baseline Cr around 1.2 -bladder scan       DVT prophylaxis: SCDs Start: 03/16/23 2023    Code Status: Full Code Family Communication: at bedside  Disposition Plan:  Level of care: Telemetry Medical Status is: Observation The patient will require care spanning > 2 midnights and should be moved to inpatient     Consultants:  GI   Subjective: Asking for a wash up today-- tired of being bothered  Objective: Vitals:   03/17/23 1621 03/17/23 2108 03/18/23 0559 03/18/23 0818  BP: 139/71 (!) 142/68 (!) 144/68 (!) 145/79  Pulse: 74 81 78 76  Resp: 17 19 18 17   Temp:  98.9 F (37.2 C) 98.6 F (37 C) 98.9 F (37.2 C)  TempSrc:   Oral Oral  SpO2: 99% 99% 99% 100%  Weight:      Height:        Intake/Output Summary  (Last 24 hours) at 03/18/2023 1109 Last data filed at 03/17/2023 2051 Gross per 24 hour  Intake 631.63 ml  Output 0 ml  Net 631.63 ml   Filed Weights   03/16/23 1653 03/16/23 1945  Weight: 58.1 kg 58.1 kg    Examination:    General: Appearance:    Well developed, well nourished female in no acute distress     Lungs:     respirations unlabored  Heart:    Normal heart rate. Normal rhythm.    MS:   All extremities are intact.   Neurologic:   Awake, alert       Data Reviewed: I have personally reviewed following labs and imaging studies  CBC: Recent Labs  Lab 03/12/23 1446 03/16/23 1704 03/17/23 0706  WBC 13.3* 16.9* 14.1*  NEUTROABS 12.3* 14.4*  --   HGB 9.3* 8.5* 7.2*  HCT 28.5* 26.1* 21.3*  MCV 100.0 100.4* 96.8  PLT 491* 385 331   Basic Metabolic Panel: Recent Labs  Lab 03/12/23 1446 03/16/23 1704 03/16/23 2146 03/17/23 0706 03/18/23 0657  NA 130* 131*  --  131* 134*  K 4.0 4.0  --  3.7 3.8  CL 98 103  --  103 102  CO2 20* 18*  --  18* 19*  GLUCOSE 199* 130*  --  109* 93  BUN 37* 33*  --  30* 28*  CREATININE 1.98* 1.92*  --  1.82* 1.95*  CALCIUM  9.7 9.1  --  8.9 9.5  MG 2.1  --  2.1 2.0  --   PHOS  --   --  2.3* 3.9  --    GFR: Estimated Creatinine Clearance: 19 mL/min (A) (by C-G formula based on SCr of 1.95 mg/dL (H)). Liver Function Tests: Recent Labs  Lab 03/12/23 1446 03/16/23 1327 03/16/23 1704 03/17/23 0706 03/18/23 0657  AST 44* 41 40 34 36  ALT 62* 64* 63* 53* 52*  ALKPHOS 678* 570* 609* 545* 585*  BILITOT 1.2 4.4* 4.6* 3.6* 3.7*  PROT 7.0 6.1* 6.4* 5.4* 6.0*  ALBUMIN 2.5* 2.0* 1.9* 1.6* 1.6*   Recent Labs  Lab 03/16/23 1704  LIPASE 26   No results for input(s): "AMMONIA" in the last 168 hours. Coagulation Profile: Recent Labs  Lab 03/16/23 1704  INR 1.3*   Cardiac Enzymes: Recent Labs  Lab 03/16/23 2146  CKTOTAL 92   BNP (last 3 results) No results for input(s): "PROBNP" in the last 8760 hours. HbA1C: No  results for input(s): "HGBA1C" in the last 72 hours. CBG: No results for input(s): "GLUCAP" in the last 168 hours. Lipid Profile: No results for input(s): "CHOL", "HDL", "LDLCALC", "TRIG", "CHOLHDL", "LDLDIRECT" in the last 72 hours. Thyroid Function Tests: Recent Labs    03/16/23 2146  TSH 0.854   Anemia Panel: Recent Labs    03/16/23 2146 03/17/23 0706  VITAMINB12  --  1,667*  FOLATE  --  20.3  FERRITIN 702*  --   TIBC 203*  --   IRON 37  --   RETICCTPCT 2.7  --    Sepsis Labs: Recent Labs  Lab 03/16/23 2146  PROCALCITON 5.96    No results found for this or any previous visit (from the past 240 hour(s)).       Radiology Studies: DG Chest Port 1 View  Result Date: 03/16/2023 CLINICAL DATA:  Fatigue. EXAM: PORTABLE CHEST 1 VIEW COMPARISON:  Chest radiograph dated 05/07/2022 and CT dated 12/07/2022. FINDINGS: Right-sided Port-A-Cath with tip close to the cavoatrial junction. There is diffuse chronic interstitial coarsening. No focal consolidation, pleural effusion, or pneumothorax. The cardiac silhouette is within normal limits. Atherosclerotic calcification of the aorta. Osteopenia with degenerative changes of spine. No acute osseous pathology. IMPRESSION: 1. No active disease. 2. Chronic interstitial coarsening. Electronically Signed   By: Elgie Collard M.D.   On: 03/16/2023 22:35        Scheduled Meds:  amLODipine  5 mg Oral Daily   Chlorhexidine Gluconate Cloth  6 each Topical Daily   feeding supplement  237 mL Oral BID BM   gabapentin  100 mg Oral BID   influenza vaccine adjuvanted  0.5 mL Intramuscular Tomorrow-1000   loratadine  10 mg Oral Daily   nebivolol  2.5 mg Oral Daily   pantoprazole  40 mg Oral Daily   Continuous Infusions:  piperacillin-tazobactam (ZOSYN)  IV       LOS: 0 days    Time spent: 45 minutes spent on chart review, discussion with nursing staff, consultants, updating family and interview/physical exam; more than 50% of that  time was spent in counseling and/or coordination of care.    Joseph Art, DO [facility] Available via Epic secure chat 7am-7pm After these hours, please refer to coverage provider listed on [email or web address] 03/18/2023, 11:09 AM

## 2023-03-19 ENCOUNTER — Encounter (HOSPITAL_COMMUNITY): Payer: Self-pay | Admitting: Internal Medicine

## 2023-03-19 ENCOUNTER — Inpatient Hospital Stay (HOSPITAL_COMMUNITY): Payer: Medicare HMO

## 2023-03-19 ENCOUNTER — Inpatient Hospital Stay (HOSPITAL_COMMUNITY): Payer: Medicare HMO | Admitting: Registered Nurse

## 2023-03-19 ENCOUNTER — Ambulatory Visit (HOSPITAL_COMMUNITY): Admission: RE | Admit: 2023-03-19 | Payer: Medicare HMO | Source: Ambulatory Visit

## 2023-03-19 ENCOUNTER — Encounter (HOSPITAL_COMMUNITY)
Admission: EM | Disposition: A | Discharge status: Home Health | Payer: Self-pay | Source: Ambulatory Visit | Attending: Internal Medicine

## 2023-03-19 DIAGNOSIS — R17 Unspecified jaundice: Secondary | ICD-10-CM

## 2023-03-19 DIAGNOSIS — K838 Other specified diseases of biliary tract: Secondary | ICD-10-CM

## 2023-03-19 DIAGNOSIS — K315 Obstruction of duodenum: Secondary | ICD-10-CM

## 2023-03-19 DIAGNOSIS — K831 Obstruction of bile duct: Secondary | ICD-10-CM | POA: Diagnosis not present

## 2023-03-19 DIAGNOSIS — C259 Malignant neoplasm of pancreas, unspecified: Secondary | ICD-10-CM | POA: Diagnosis not present

## 2023-03-19 DIAGNOSIS — R932 Abnormal findings on diagnostic imaging of liver and biliary tract: Secondary | ICD-10-CM

## 2023-03-19 HISTORY — PX: REMOVAL OF STONES: SHX5545

## 2023-03-19 HISTORY — PX: ENDOSCOPIC RETROGRADE CHOLANGIOPANCREATOGRAPHY (ERCP) WITH PROPOFOL: SHX5810

## 2023-03-19 LAB — COMPREHENSIVE METABOLIC PANEL
ALT: 47 U/L — ABNORMAL HIGH (ref 0–44)
AST: 32 U/L (ref 15–41)
Albumin: <1.5 g/dL — ABNORMAL LOW (ref 3.5–5.0)
Alkaline Phosphatase: 547 U/L — ABNORMAL HIGH (ref 38–126)
Anion gap: 9 (ref 5–15)
BUN: 25 mg/dL — ABNORMAL HIGH (ref 8–23)
CO2: 19 mmol/L — ABNORMAL LOW (ref 22–32)
Calcium: 9.3 mg/dL (ref 8.9–10.3)
Chloride: 107 mmol/L (ref 98–111)
Creatinine, Ser: 1.89 mg/dL — ABNORMAL HIGH (ref 0.44–1.00)
GFR, Estimated: 26 mL/min — ABNORMAL LOW (ref >60)
Glucose, Bld: 142 mg/dL — ABNORMAL HIGH (ref 70–99)
Potassium: 4.1 mmol/L (ref 3.5–5.1)
Sodium: 135 mmol/L (ref 135–145)
Total Bilirubin: 2.5 mg/dL — ABNORMAL HIGH (ref 0.3–1.2)
Total Protein: 5.4 g/dL — ABNORMAL LOW (ref 6.5–8.1)

## 2023-03-19 LAB — ABO/RH: ABO/RH(D): A POS

## 2023-03-19 LAB — CBC
HCT: 21.8 % — ABNORMAL LOW (ref 36.0–46.0)
Hemoglobin: 7 g/dL — ABNORMAL LOW (ref 12.0–15.0)
MCH: 31.4 pg (ref 26.0–34.0)
MCHC: 32.1 g/dL (ref 30.0–36.0)
MCV: 97.8 fL (ref 80.0–100.0)
Platelets: 396 10*3/uL (ref 150–400)
RBC: 2.23 MIL/uL — ABNORMAL LOW (ref 3.87–5.11)
RDW: 18.6 % — ABNORMAL HIGH (ref 11.5–15.5)
WBC: 15.4 10*3/uL — ABNORMAL HIGH (ref 4.0–10.5)
nRBC: 0 % (ref 0.0–0.2)

## 2023-03-19 LAB — PROCALCITONIN: Procalcitonin: 1.71 ng/mL

## 2023-03-19 LAB — PREPARE RBC (CROSSMATCH)

## 2023-03-19 SURGERY — ENDOSCOPIC RETROGRADE CHOLANGIOPANCREATOGRAPHY (ERCP) WITH PROPOFOL
Anesthesia: General

## 2023-03-19 MED ORDER — FENTANYL CITRATE (PF) 100 MCG/2ML IJ SOLN
INTRAMUSCULAR | Status: AC
  Filled 2023-03-19: qty 2

## 2023-03-19 MED ORDER — ONDANSETRON HCL 4 MG/2ML IJ SOLN
INTRAMUSCULAR | Status: DC | PRN
  Administered 2023-03-19: 4 mg via INTRAVENOUS

## 2023-03-19 MED ORDER — METHYLPREDNISOLONE SODIUM SUCC 125 MG IJ SOLR
INTRAMUSCULAR | Status: AC
  Filled 2023-03-19: qty 2

## 2023-03-19 MED ORDER — LIDOCAINE 2% (20 MG/ML) 5 ML SYRINGE
INTRAMUSCULAR | Status: DC | PRN
  Administered 2023-03-19: 60 mg via INTRAVENOUS

## 2023-03-19 MED ORDER — GLUCAGON HCL RDNA (DIAGNOSTIC) 1 MG IJ SOLR
INTRAMUSCULAR | Status: DC | PRN
Start: 2023-03-19 — End: 2023-03-19
  Administered 2023-03-19: .25 mg via INTRAVENOUS

## 2023-03-19 MED ORDER — DICLOFENAC SUPPOSITORY 100 MG
RECTAL | Status: DC | PRN
  Administered 2023-03-19: 100 mg via RECTAL

## 2023-03-19 MED ORDER — DIPHENHYDRAMINE HCL 50 MG/ML IJ SOLN
INTRAMUSCULAR | Status: AC
  Filled 2023-03-19: qty 1

## 2023-03-19 MED ORDER — SODIUM CHLORIDE 0.9% IV SOLUTION: Freq: Once | INTRAVENOUS | Status: DC

## 2023-03-19 MED ORDER — INDOMETHACIN 50 MG RE SUPP: 100.0000 mg | Freq: Once | RECTAL | Status: AC

## 2023-03-19 MED ORDER — METHYLPREDNISOLONE SODIUM SUCC 125 MG IJ SOLR
125.0000 mg | Freq: Once | INTRAMUSCULAR | Status: AC
  Administered 2023-03-19: 125 mg via INTRAVENOUS

## 2023-03-19 MED ORDER — PHENYLEPHRINE HCL-NACL 20-0.9 MG/250ML-% IV SOLN
INTRAVENOUS | Status: DC | PRN
  Administered 2023-03-19: 50 ug/min via INTRAVENOUS

## 2023-03-19 MED ORDER — DIPHENHYDRAMINE HCL 50 MG/ML IJ SOLN
25.0000 mg | Freq: Once | INTRAMUSCULAR | Status: AC
  Administered 2023-03-19: 25 mg via INTRAVENOUS

## 2023-03-19 MED ORDER — PROPOFOL 10 MG/ML IV BOLUS
INTRAVENOUS | Status: DC | PRN
  Administered 2023-03-19: 100 mg via INTRAVENOUS

## 2023-03-19 MED ORDER — SODIUM CHLORIDE 0.9 % IV SOLN
INTRAVENOUS | Status: DC | PRN
  Administered 2023-03-19: 27 mL

## 2023-03-19 MED ORDER — ROCURONIUM BROMIDE 10 MG/ML (PF) SYRINGE
PREFILLED_SYRINGE | INTRAVENOUS | Status: DC | PRN
  Administered 2023-03-19: 50 mg via INTRAVENOUS

## 2023-03-19 MED ORDER — DICLOFENAC SUPPOSITORY 100 MG
RECTAL | Status: AC
  Filled 2023-03-19: qty 1

## 2023-03-19 MED ORDER — SUGAMMADEX SODIUM 200 MG/2ML IV SOLN
INTRAVENOUS | Status: DC | PRN
  Administered 2023-03-19: 232.4 mg via INTRAVENOUS

## 2023-03-19 MED ORDER — GLUCAGON HCL RDNA (DIAGNOSTIC) 1 MG IJ SOLR
INTRAMUSCULAR | Status: AC
  Filled 2023-03-19: qty 1

## 2023-03-19 MED ORDER — CIPROFLOXACIN IN D5W 400 MG/200ML IV SOLN
INTRAVENOUS | Status: AC
  Filled 2023-03-19: qty 200

## 2023-03-19 NOTE — Transfer of Care (Signed)
Immediate Anesthesia Transfer of Care Note  Patient: Meagan [name]  Procedure(s) Performed: ENDOSCOPIC RETROGRADE CHOLANGIOPANCREATOGRAPHY (ERCP) WITH PROPOFOL REMOVAL OF SLUDGE  Patient Location: Endoscopy Unit  Anesthesia Type:General  Level of Consciousness: drowsy  Airway & Oxygen Therapy: Patient Spontanous Breathing and Patient connected to nasal cannula oxygen  Post-op Assessment: Report given to RN and Post -op Vital signs reviewed and stable  Post vital signs: Reviewed and stable  Last Vitals:  Vitals Value Taken Time  BP 122/60 03/19/23 1333  Temp 36.5 C 03/19/23 1333  Pulse 68 03/19/23 1337  Resp 21 03/19/23 1337  SpO2 96 % 03/19/23 1337  Vitals shown include unfiled device data.  Last Pain:  Vitals:   03/19/23 1333  TempSrc: Temporal  PainSc: Asleep         Complications: No notable events documented.

## 2023-03-19 NOTE — Op Note (Signed)
[facility] Patient Name: Meagan [name] Procedure Date : 03/19/2023 MRN: [id number] Attending MD: Corliss Parish , MD, [id number] Date of Birth: 11-29-1934 CSN: [id number] Age: 88 Admit Type: Inpatient Procedure:                ERCP Indications:              Abnormal MRCP, Jaundice, Elevated liver enzymes Providers:                Corliss Parish, MD, Fransisca Connors, Faustina                            Mbumina, Technician Referring MD:             Inpatient Medical Service Medicines:                General Anesthesia, Patient on scheduled Zosyn and                            not due yet, Diclofenac 100 mg rectal, Glucagon 0.5                            mg IV Complications:            No immediate complications. Estimated Blood Loss:     Estimated blood loss was minimal. Procedure:                Pre-Anesthesia Assessment:                           - Prior to the procedure, a History and Physical                            was performed, and patient medications and                            allergies were reviewed. The patient's tolerance of                            previous anesthesia was also reviewed. The risks                            and benefits of the procedure and the sedation                            options and risks were discussed with the patient.                            All questions were answered, and informed consent                            was obtained. Prior Anticoagulants: The patient has                            taken no anticoagulant or antiplatelet agents. ASA  Grade Assessment: III - A patient with severe                            systemic disease. After reviewing the risks and                            benefits, the patient was deemed in satisfactory                            condition to undergo the procedure.                           After obtaining informed consent, the scope was                             passed under direct vision. Throughout the                            procedure, the patient's blood pressure, pulse, and                            oxygen saturations were monitored continuously. The                            [employer] single use                            duodenoscope was introduced through the mouth, and                            used to inject contrast into and used to inject                            contrast into the bile duct. The ERCP was                            technically difficult and complex due to inadequate                            patient positioning. Successful completion of the                            procedure was aided by using scope torsion. The                            patient tolerated the procedure. Scope In: Scope Out: Findings:      A biliary stent was visible on the scout film.      The upper GI tract was traversed under direct vision without detailed       examination. An acquired benign-appearing, extrinsic impression leading       to moderate stenosis was found in the D1/D2 sweep. This did not allow       the scope to get into D2 or allow further passage into D3 to  attempt/try       to reduce the duodenoscope. Long position was the only position that we       could maintain for extended periods of time. A biliary sphincterotomy       had been performed. The sphincterotomy appeared open. Two uncovered       metal biliary stents originating in the biliary tree were emerging from       the major papilla. The stents were visibly occluded. The bile duct could       not be cannulated with the Hydratome sphincterotome in typical fashion       for a significant time period due to the very short position of       sphincterotome we had in this long-position and also the wire went       through the uncovered stents many times. After knuckling the wire, a       short 0.035 inch Soft Jagwire was then  passed into the left biliary       tree. The Hydratome sphincterotome was passed over the guidewire and the       bile duct was then deeply cannulated. Contrast was injected. I       personally interpreted the bile duct images. Ductal flow of contrast was       adequate. Image quality was adequate. Contrast extended to the hepatic       ducts. Opacification of the entire biliary tree except for the cystic       duct and gallbladder was successful. The main bile duct contained       filling defects thought to be sludge. The main bile duct was mildly       dilated (though this was mostly within the biliary stent). The largest       diameter was 10 mm. The left and right main ducts were less than 8 mm in       size. To discover objects, the biliary tree was swept with a retrieval       balloon starting distally and then eventually up to the bifurcation.       Cholangitis and debris and sludge was swept from the duct. An occlusion       cholangiogram was performed that showed no further significant biliary       pathology and the stents were patent. Drainage was adequate of the       contrast injected.      A pancreatogram was not performed.      The duodenoscope was withdrawn from the patient. Impression:               - Acquired duodenal stenosis as a result of                            extrinsic impression at D1/D2 sweep which precluded                            any passage of scope into D2/D3 for reduction. All                            procedure in unstable long-position with short                            sphincterotome use.Marland Kitchen                           -  The fluoroscopic examination was suspicious for                            sludge.                           - The entire main bile duct was mildly dilated. I                            could not visualize the very proximal left hepatic                            duct however presumably from the metastatic deposit                             within the liver.                           - The biliary tree was swept and pus, debris and                            sludge were found.                           - The stents were felt to be patent and no                            additional stenting performed today. Recommendation:           - The patient will be observed post-procedure,                            until all discharge criteria are met.                           - Return patient to hospital ward for ongoing care.                           - Advance diet as tolerated.                           - Observe patient's clinical course.                           - Check liver enzymes (AST, ALT, alkaline                            phosphatase, bilirubin) in the morning.                           - Watch for pancreatitis, bleeding, perforation,                            and cholangitis.                           -  5-days of antibiotics in setting of cholangitis                            (IV vs PO as per medical service).                           - Monitor LFT pattern over the next few days while                            on antibiotic therapy and after stent clear out. If                            issues persist with LFT abnormalities then ask VIR                            to consider if very proximal PTBD may be able to                            traverse, where the new presumed metastatic                            mass-lesion is that may be source for further                            biliary obstruction. She does not need another                            stent currently of her distal duct.                           - The findings and recommendations were discussed                            with the patient.                           - The findings and recommendations were discussed                            with the referring physician. Procedure Code(s):        --- Professional ---                            [phone], Endoscopic retrograde                            cholangiopancreatography (ERCP); with removal of                            calculi/debris from biliary/pancreatic duct(s)                           74328, 26, Endoscopic catheterization of the  biliary ductal system, radiological supervision and                            interpretation Diagnosis Code(s):        --- Professional ---                           K31.5, Obstruction of duodenum                           R17, Unspecified jaundice                           R74.8, Abnormal levels of other serum enzymes                           K83.8, Other specified diseases of biliary tract                           R93.2, Abnormal findings on diagnostic imaging of                            liver and biliary tract CPT copyright 2022 [employer]. All rights reserved. The codes documented in this report are preliminary and upon coder review may  be revised to meet current compliance requirements. Corliss Parish, MD 03/19/2023 1:35:45 PM Number of Addenda: 0

## 2023-03-19 NOTE — Progress Notes (Signed)
PT Cancellation Note  Patient Details Name: Meagan [name] MRN: [id number] DOB: 01/29/1935   Cancelled Treatment:    Reason Eval/Treat Not Completed: Patient declined, no reason specified (Pt declined stating that she was tired and had just returned to bed with nursing staff. Pt has procedure scheduled later today. Will follow up if time allows.)   Gladys Damme 03/19/2023, 9:17 AM

## 2023-03-19 NOTE — Anesthesia Procedure Notes (Signed)
Procedure Name: Intubation Date/Time: 03/19/2023 12:30 PM  Performed by: Loleta Sahily Biddle, CRNAPre-anesthesia Checklist: Patient identified, Patient being monitored, Timeout performed, Emergency Drugs available and Suction available Patient Re-evaluated:Patient Re-evaluated prior to induction Oxygen Delivery Method: Circle system utilized Preoxygenation: Pre-oxygenation with 100% oxygen Induction Type: IV induction Ventilation: Mask ventilation without difficulty Laryngoscope Size: Mac and 3 Grade View: Grade I Tube type: Oral Tube size: 6.5 mm Number of attempts: 1 Airway Equipment and Method: Stylet Placement Confirmation: ETT inserted through vocal cords under direct vision, positive ETCO2 and breath sounds checked- equal and bilateral Secured at: 21 cm Tube secured with: Tape Dental Injury: Teeth and Oropharynx as per pre-operative assessment

## 2023-03-19 NOTE — Anesthesia Preprocedure Evaluation (Addendum)
Anesthesia Evaluation  Patient identified by MRN, date of birth, ID band Patient awake    Reviewed: Allergy & Precautions, H&P , NPO status , Patient's Chart, lab work & pertinent test results  History of Anesthesia Complications (+) PONV and history of anesthetic complications  Airway Mallampati: II  TM Distance: >3 FB Neck ROM: full    Dental no notable dental hx. (+) Dental Advisory Given   Pulmonary neg pulmonary ROS   Pulmonary exam normal breath sounds clear to auscultation       Cardiovascular hypertension, Normal cardiovascular exam+ pacemaker + Valvular Problems/Murmurs MR  Rhythm:Regular Rate:Normal     Neuro/Psych    GI/Hepatic ,GERD  ,,Hx of pancreatic adenocarcinoma    Endo/Other    Renal/GU CRFRenal disease     Musculoskeletal   Abdominal   Peds  Hematology  (+) Blood dyscrasia, anemia   Anesthesia Other Findings Biliary obstruction 2/2 pancreatic adenocarcinoma  Reproductive/Obstetrics                             Anesthesia Physical Anesthesia Plan  ASA: 4  Anesthesia Plan: General   Post-op Pain Management:    Induction: Intravenous  PONV Risk Score and Plan: 4 or greater and Ondansetron, Dexamethasone and Treatment may vary due to age or medical condition  Airway Management Planned: Oral ETT  Additional Equipment:   Intra-op Plan:   Post-operative Plan: Extubation in OR  Informed Consent: I have reviewed the patients History and Physical, chart, labs and discussed the procedure including the risks, benefits and alternatives for the proposed anesthesia with the patient or authorized representative who has indicated his/her understanding and acceptance.     Dental advisory given  Plan Discussed with: CRNA, Anesthesiologist and Surgeon  Anesthesia Plan Comments:        Anesthesia Quick Evaluation

## 2023-03-19 NOTE — Interval H&P Note (Signed)
History and Physical Interval Note:  03/19/2023 12:13 PM  Meagan [name]  has presented today for surgery, with the diagnosis of Biliary obstruction, stent occlusion.  The various methods of treatment have been discussed with the patient and family. After consideration of risks, benefits and other options for treatment, the patient has consented to  Procedure(s): ENDOSCOPIC RETROGRADE CHOLANGIOPANCREATOGRAPHY (ERCP) WITH PROPOFOL (N/A) as a surgical intervention.  The patient's history has been reviewed, patient examined, no change in status, stable for surgery.  I have reviewed the patient's chart and labs.  Questions were answered to the patient's satisfaction.     The risks of an ERCP were discussed at length, including but not limited to the risk of perforation, bleeding, abdominal pain, post-ERCP pancreatitis (while usually mild can be severe and even life threatening).    [employer]

## 2023-03-19 NOTE — Progress Notes (Signed)
PROGRESS NOTE    Meagan [name]  KGM:[id number] DOB: 12-Sep-1934 DOA: 03/16/2023 PCP: [name], [name]    Brief Narrative:  88 y.o. female with medical history significant of pancreatic carcinoma, hypertension, essential tremor, GERD, HLD, HTN, anemia  Admitted for  Biliary obstruction due to Pancreatic adenocarcinoma. MRCP + now for ERCP   Assessment and Plan: Biliary obstruction [facility] GI is aware  -MRCP done now plan for ERCP No fever /chills to suggest infection at this time but pro calcitonin is elevated so continue zosyn  -will get palliative care consult after speaking with family re: MRCP  Essential hypertension Continue Bystolic 2.5 mg in the morning and Norvasc 5 mg a day   Mixed hyperlipidemia -resume meds as able   Leukocytosis Chronic recurrent   Iron deficiency anemia - transfuse for hemoglobin of 7 and symptomatic on 10/18   Pancreatic carcinoma [facility]) Patient will need follow-up with oncology hold Xeloda   Hyponatremia -stable   S/P placement of cardiac pacemaker Hx of pacemaker for bradycardia   Debility Would benefit from PT/OT eval prior to dc   AKI (acute kidney injury) (HCC) on CKD stage IIIa In the setting of dehydration will rehydrate and follow renal function -baseline Cr around 1.2 -bladder scan       DVT prophylaxis: SCDs Start: 03/16/23 2023    Code Status: Full Code Family Communication: at bedside  Disposition Plan:  Level of care: Med-Surg Status is: inpt  Consultants:  GI Palliative care consult   Subjective: sleeping  Objective: Vitals:   03/18/23 1652 03/18/23 2105 03/19/23 0417 03/19/23 0846  BP: (!) 122/55 136/68 (!) 144/71 138/66  Pulse: 72 79 74 76  Resp: 17 16 20 18   Temp:   98.6 F (37 C) 98.5 F (36.9 C)  TempSrc:   Oral Oral  SpO2: 96% 99% 98% 99%  Weight:      Height:        Intake/Output Summary (Last 24 hours) at 03/19/2023 1157 Last data filed at 03/19/2023 0900 Gross per 24 hour   Intake 1425.83 ml  Output --  Net 1425.83 ml   Filed Weights   03/16/23 1653 03/16/23 1945  Weight: 58.1 kg 58.1 kg    Examination:    General: Appearance:    elderly female in no acute distress     Lungs:     Clear to auscultation bilaterally  Heart:    Normal heart rate. Normal rhythm.    MS:   All extremities are intact.   Neurologic:   Awake, alert       Data Reviewed: I have personally reviewed following labs and imaging studies  CBC: Recent Labs  Lab 03/12/23 1446 03/16/23 1704 03/17/23 0706 03/19/23 0416  WBC 13.3* 16.9* 14.1* 15.4*  NEUTROABS 12.3* 14.4*  --   --   HGB 9.3* 8.5* 7.2* 7.0*  HCT 28.5* 26.1* 21.3* 21.8*  MCV 100.0 100.4* 96.8 97.8  PLT 491* 385 331 396   Basic Metabolic Panel: Recent Labs  Lab 03/12/23 1446 03/16/23 1704 03/16/23 2146 03/17/23 0706 03/18/23 0657 03/19/23 0416  NA 130* 131*  --  131* 134* 135  K 4.0 4.0  --  3.7 3.8 4.1  CL 98 103  --  103 102 107  CO2 20* 18*  --  18* 19* 19*  GLUCOSE 199* 130*  --  109* 93 142*  BUN 37* 33*  --  30* 28* 25*  CREATININE 1.98* 1.92*  --  1.82* 1.95* 1.89*  CALCIUM 9.7  9.1  --  8.9 9.5 9.3  MG 2.1  --  2.1 2.0  --   --   PHOS  --   --  2.3* 3.9  --   --    GFR: Estimated Creatinine Clearance: 19.6 mL/min (A) (by C-G formula based on SCr of 1.89 mg/dL (H)). Liver Function Tests: Recent Labs  Lab 03/16/23 1327 03/16/23 1704 03/17/23 0706 03/18/23 0657 03/19/23 0416  AST 41 40 34 36 32  ALT 64* 63* 53* 52* 47*  ALKPHOS 570* 609* 545* 585* 547*  BILITOT 4.4* 4.6* 3.6* 3.7* 2.5*  PROT 6.1* 6.4* 5.4* 6.0* 5.4*  ALBUMIN 2.0* 1.9* 1.6* 1.6* <1.5*   Recent Labs  Lab 03/16/23 1704  LIPASE 26   No results for input(s): "AMMONIA" in the last 168 hours. Coagulation Profile: Recent Labs  Lab 03/16/23 1704  INR 1.3*   Cardiac Enzymes: Recent Labs  Lab 03/16/23 2146  CKTOTAL 92   BNP (last 3 results) No results for input(s): "PROBNP" in the last 8760  hours. HbA1C: No results for input(s): "HGBA1C" in the last 72 hours. CBG: No results for input(s): "GLUCAP" in the last 168 hours. Lipid Profile: No results for input(s): "CHOL", "HDL", "LDLCALC", "TRIG", "CHOLHDL", "LDLDIRECT" in the last 72 hours. Thyroid Function Tests: Recent Labs    03/16/23 2146  TSH 0.854   Anemia Panel: Recent Labs    03/16/23 2146 03/17/23 0706  VITAMINB12  --  1,667*  FOLATE  --  20.3  FERRITIN 702*  --   TIBC 203*  --   IRON 37  --   RETICCTPCT 2.7  --    Sepsis Labs: Recent Labs  Lab 03/16/23 2146 03/19/23 0416  PROCALCITON 5.96 1.71    No results found for this or any previous visit (from the past 240 hour(s)).       Radiology Studies: MR ABDOMEN MRCP W WO CONTAST  Result Date: 03/18/2023 CLINICAL DATA:  History of pancreatic cancer with biliary obstruction. Previous biliary stenting, most recently 10 months ago. Chemotherapy and radiation therapy completed. Rising CA 19-9 levels and liver function studies suspicious for recurrent biliary obstruction. EXAM: MRI ABDOMEN WITHOUT AND WITH CONTRAST (INCLUDING MRCP) TECHNIQUE: Multiplanar multisequence MR imaging of the abdomen was performed both before and after the administration of intravenous contrast. Heavily T2-weighted images of the biliary and pancreatic ducts were obtained, and three-dimensional MRCP images were rendered by post processing. CONTRAST:  5mL GADAVIST GADOBUTROL 1 MMOL/ML IV SOLN COMPARISON:  Abdominopelvic CT 12/07/2022 and 07/09/2022. Abdominal MRI 12/24/2021. FINDINGS: Despite efforts by the technologist and patient, moderate motion artifact is present on today's exam and could not be eliminated. This reduces exam sensitivity and specificity. The motion progresses throughout the examination. Lower chest: Trace right pleural effusion with mild bibasilar atelectasis. Hepatobiliary: Interval development of a heterogeneous mass or adjacent masses superiorly in the left hepatic  lobe, measuring up to 4.8 x 3.8 cm on image 11/3. These masses demonstrate heterogeneous T2 hyperintensity, low T1 signal and restricted diffusion. Following contrast, there is some peripheral enhancement of these lesions, especially the inferior components. No suspicious lesions identified in the right lobe. Mild intrahepatic biliary dilatation has developed in the left lobe. No significant biliary dilatation identified in the right lobe. A metallic biliary stent remains in place; pneumobilia and stent patency difficult to confirm by MRI. The gallbladder appears unremarkable, without stones, distension or wall thickening. Pancreas: There is a multi-septated cystic lesion in the pancreatic head, adjacent to the biliary stent which  measures up to 1.8 x 1.1 cm on image 31/3. This demonstrates no definite enhancement following contrast and may relate to the treated pancreatic malignancy. There is mild pancreatic ductal dilatation. Spleen: Normal in size without focal abnormality. Adrenals/Urinary Tract: Both adrenal glands appear normal. New moderate hydronephrosis and hydroureter with abrupt change in ureteral caliber at the level of the L4-5 disc space (coronal image 20/2). No obvious reader all calculus or surrounding soft tissue mass. No significant collecting system dilatation on the left. There is a small cyst in the upper pole of the left kidney for which no specific follow-up imaging is recommended. Bladder not imaged. Stomach/Bowel: The stomach appears unremarkable for its degree of distension. No evidence of bowel wall thickening, distention or surrounding inflammatory change. Vascular/Lymphatic: There are no enlarged abdominal lymph nodes. The portal, superior mesenteric and splenic veins appear patent. There are small central mesenteric venous collaterals. No acute vascular findings are identified. Aortic and branch vessel atherosclerosis, better seen on CT. Other: A small amount of ascites has developed  since the recent CT. There is nonspecific edema throughout the subcutaneous and intra-abdominal fat without focal fluid collection. No peritoneal masses are identified. Musculoskeletal: No acute or significant osseous findings. Mild multilevel spondylosis. IMPRESSION: 1. Interval development of a heterogeneous mass or adjacent masses superiorly in the left hepatic lobe, suspicious for metastatic disease. There is associated new mild intrahepatic biliary dilatation in the left lobe. 2. Pneumobilia and biliary stent patency difficult to address by MRI. 3. New moderate hydronephrosis and hydroureter with abrupt change in ureteral caliber at the level of the L4-5 disc space. No obvious obstructing calculus or surrounding soft tissue mass identified. 4. New small amount of ascites and nonspecific subcutaneous and intra-abdominal fat edema. 5. Multi-septated cystic lesion in the pancreatic head may relate to the treated pancreatic malignancy. 6. Current study is motion degraded. In this patient with significant renal insufficiency and presumed desire to avoid iodinated contrast, alternative imaging is limited. However, noncontrast abdominopelvic CT may be helpful to assess biliary stent patency and the cause of the apparent mid right ureteral obstruction. Electronically Signed   By: Carey Bullocks M.D.   On: 03/18/2023 14:15        Scheduled Meds:  [ZOX Hold] sodium chloride   Intravenous Once   [MAR Hold] amLODipine  5 mg Oral Daily   [MAR Hold] Chlorhexidine Gluconate Cloth  6 each Topical Daily   [MAR Hold] feeding supplement  237 mL Oral BID BM   [MAR Hold] gabapentin  100 mg Oral BID   [MAR Hold] influenza vaccine adjuvanted  0.5 mL Intramuscular Tomorrow-1000   [MAR Hold] loratadine  10 mg Oral Daily   [MAR Hold] loratadine  10 mg Oral Once   [MAR Hold] nebivolol  2.5 mg Oral Daily   [MAR Hold] pantoprazole  40 mg Oral Daily   Continuous Infusions:  sodium chloride 50 mL/hr at 03/18/23 1205    [MAR Hold] piperacillin-tazobactam (ZOSYN)  IV 2.25 g (03/19/23 0516)     LOS: 1 day    Time spent: 45 minutes spent on chart review, discussion with nursing staff, consultants, updating family and interview/physical exam; more than 50% of that time was spent in counseling and/or coordination of care.    Joseph Art, DO [facility] Available via Epic secure chat 7am-7pm After these hours, please refer to coverage provider listed on [email or web address] 03/19/2023, 11:57 AM

## 2023-03-19 NOTE — Plan of Care (Signed)
  Problem: Pain Managment: Goal: General experience of comfort will improve Outcome: Progressing   Problem: Safety: Goal: Ability to remain free from injury will improve Outcome: Progressing   

## 2023-03-19 NOTE — Anesthesia Postprocedure Evaluation (Signed)
Anesthesia Post Note  Patient: Meagan [name]  Procedure(s) Performed: ENDOSCOPIC RETROGRADE CHOLANGIOPANCREATOGRAPHY (ERCP) WITH PROPOFOL REMOVAL OF SLUDGE     Patient location during evaluation: PACU Anesthesia Type: General Level of consciousness: awake and alert Pain management: pain level controlled Vital Signs Assessment: post-procedure vital signs reviewed and stable Respiratory status: spontaneous breathing, nonlabored ventilation, respiratory function stable and patient connected to nasal cannula oxygen Cardiovascular status: blood pressure returned to baseline and stable Postop Assessment: no apparent nausea or vomiting Anesthetic complications: no   No notable events documented.  Last Vitals:  Vitals:   03/19/23 1350 03/19/23 1400  BP: (!) 106/56 103/74  Pulse: 66 65  Resp: 19 18  Temp:    SpO2: 96% 95%    Last Pain:  Vitals:   03/19/23 1400  TempSrc:   PainSc: 0-No pain                 Woodward Nation

## 2023-03-20 DIAGNOSIS — C259 Malignant neoplasm of pancreas, unspecified: Secondary | ICD-10-CM | POA: Diagnosis not present

## 2023-03-20 DIAGNOSIS — K831 Obstruction of bile duct: Secondary | ICD-10-CM | POA: Diagnosis not present

## 2023-03-20 LAB — CBC
HCT: 27.7 % — ABNORMAL LOW (ref 36.0–46.0)
Hemoglobin: 9 g/dL — ABNORMAL LOW (ref 12.0–15.0)
MCH: 31.1 pg (ref 26.0–34.0)
MCHC: 32.5 g/dL (ref 30.0–36.0)
MCV: 95.8 fL (ref 80.0–100.0)
Platelets: 474 10*3/uL — ABNORMAL HIGH (ref 150–400)
RBC: 2.89 MIL/uL — ABNORMAL LOW (ref 3.87–5.11)
RDW: 18.7 % — ABNORMAL HIGH (ref 11.5–15.5)
WBC: 30.7 10*3/uL — ABNORMAL HIGH (ref 4.0–10.5)
nRBC: 0 % (ref 0.0–0.2)

## 2023-03-20 LAB — TYPE AND SCREEN
ABO/RH(D): A POS
Antibody Screen: NEGATIVE
Unit division: 0

## 2023-03-20 LAB — BPAM RBC
Blood Product Expiration Date: 202411172359
ISSUE DATE / TIME: 202410181701
Unit Type and Rh: 6200

## 2023-03-20 LAB — COMPREHENSIVE METABOLIC PANEL WITH GFR
ALT: 49 U/L — ABNORMAL HIGH (ref 0–44)
AST: 31 U/L (ref 15–41)
Albumin: 1.7 g/dL — ABNORMAL LOW (ref 3.5–5.0)
Alkaline Phosphatase: 604 U/L — ABNORMAL HIGH (ref 38–126)
Anion gap: 11 (ref 5–15)
BUN: 29 mg/dL — ABNORMAL HIGH (ref 8–23)
CO2: 18 mmol/L — ABNORMAL LOW (ref 22–32)
Calcium: 8.8 mg/dL — ABNORMAL LOW (ref 8.9–10.3)
Chloride: 105 mmol/L (ref 98–111)
Creatinine, Ser: 2.23 mg/dL — ABNORMAL HIGH (ref 0.44–1.00)
GFR, Estimated: 21 mL/min — ABNORMAL LOW
Glucose, Bld: 195 mg/dL — ABNORMAL HIGH (ref 70–99)
Potassium: 4.3 mmol/L (ref 3.5–5.1)
Sodium: 134 mmol/L — ABNORMAL LOW (ref 135–145)
Total Bilirubin: 2.2 mg/dL — ABNORMAL HIGH (ref 0.3–1.2)
Total Protein: 6.2 g/dL — ABNORMAL LOW (ref 6.5–8.1)

## 2023-03-20 MED ORDER — SALINE SPRAY 0.65 % NA SOLN
1.0000 | NASAL | Status: DC | PRN
  Filled 2023-03-20: qty 44

## 2023-03-20 NOTE — Plan of Care (Signed)
  Problem: Clinical Measurements: Goal: Ability to maintain clinical measurements within normal limits will improve Outcome: Progressing Goal: Will remain free from infection Outcome: Progressing Goal: Diagnostic test results will improve Outcome: Progressing Goal: Respiratory complications will improve Outcome: Progressing   Problem: Activity: Goal: Risk for activity intolerance will decrease Outcome: Progressing   Problem: Nutrition: Goal: Adequate nutrition will be maintained Outcome: Progressing   Problem: Coping: Goal: Level of anxiety will decrease Outcome: Progressing   Problem: Elimination: Goal: Will not experience complications related to bowel motility Outcome: Progressing Goal: Will not experience complications related to urinary retention Outcome: Progressing   Problem: Pain Managment: Goal: General experience of comfort will improve Outcome: Progressing   Problem: Safety: Goal: Ability to remain free from injury will improve Outcome: Progressing   Problem: Skin Integrity: Goal: Risk for impaired skin integrity will decrease Outcome: Progressing   

## 2023-03-20 NOTE — Progress Notes (Addendum)
Occupational Therapy Treatment Patient Details Name: Meagan [name] MRN: [id number] DOB: 1935-04-19 Today's Date: 03/20/2023   History of present illness 88 yo female admitted with biliary obstruction due to pancreatic adenocarcinoma PMH pancreatic carcinoma, HTN essential tremor GERD HLD HTN Anemia   OT comments  Pt. Seen for skilled OT treatment session.  Pt. Able to complete bed mobility CGA.  In room ambulation for toileting and standing grooming task with cga and intermittent cues for sequencing and safety.  Pts. Had several family members present during session.  Reviewed with them minimal physical assistance but pt. Requiring cues for safety and sequencing during task completion.  They verbalized understanding.  Cont. With acute OT POC while pt. Here.        If plan is discharge home, recommend the following:      Equipment Recommendations  None recommended by OT    Recommendations for Other Services      Precautions / Restrictions Precautions Precautions: Fall       Mobility Bed Mobility Overal bed mobility: Needs Assistance Bed Mobility: Rolling, Sidelying to Sit Rolling: Contact guard assist Sidelying to sit: Contact guard assist       General bed mobility comments: help to elevate trunk off bed surface    Transfers Overall transfer level: Needs assistance Equipment used: Rolling walker (2 wheels) Transfers: Sit to/from Stand, Bed to chair/wheelchair/BSC Sit to Stand: Contact guard assist           General transfer comment: cues for rw safety     Balance                                           ADL either performed or assessed with clinical judgement   ADL Overall ADL's : Needs assistance/impaired     Grooming: Wash/dry hands;Standing;Supervision/safety;Cueing for sequencing               Lower Body Dressing: Minimal assistance;Sitting/lateral leans-education provided on not bending forward for don/doff to cross legs  over each knee for safety.     Toilet Transfer: Supervision/safety;Ambulation;Rolling walker (2 wheels);Grab bars   Toileting- Clothing Manipulation and Hygiene: Contact guard assist;Sit to/from stand;Cueing for sequencing Toileting - Clothing Manipulation Details (indicate cue type and reason): cues for management of her briefs, also to change them (they were soiled and she was still going to pull them on) when i mentioned to change them she said "oh yes i usually do like to change them"     Functional mobility during ADLs: Supervision/safety;Rolling walker (2 wheels);Cueing for sequencing;Cueing for safety General ADL Comments: cues for sequence    Extremity/Trunk Assessment              Vision       Perception     Praxis      Cognition Arousal: Alert Behavior During Therapy: Flat affect, WFL for tasks assessed/performed Overall Cognitive Status: History of cognitive impairments - at baseline                                          Exercises      Shoulder Instructions       General Comments      Pertinent Vitals/ Pain       Pain Assessment Pain Assessment: No/denies pain  Home Living  Prior Functioning/Environment              Frequency  Min 1X/week        Progress Toward Goals  OT Goals(current goals can now be found in the care plan section)  Progress towards OT goals: Progressing toward goals     Plan      Co-evaluation                 AM-PAC OT "6 Clicks" Daily Activity     Outcome Measure   Help from another person eating meals?: None Help from another person taking care of personal grooming?: None Help from another person toileting, which includes using toliet, bedpan, or urinal?: A Little Help from another person bathing (including washing, rinsing, drying)?: A Little     6 Click Score: 14    End of Session Equipment Utilized During  Treatment: Gait belt;Rolling walker (2 wheels)  OT Visit Diagnosis: Unsteadiness on feet (R26.81);Muscle weakness (generalized) (M62.81)   Activity Tolerance Patient tolerated treatment well   Patient Left in chair;with call bell/phone within reach;Other (comment) (family reports pt. will never be alone, reviewed if so need to alert staff for alarm to be set)   Nurse Communication Other (comment) (secure chat regarding alarm status and to alert pt. had a BM)        Time: 1005-1035 OT Time Calculation (min): 30 min  Charges: OT General Charges $OT Visit: 1 Visit OT Treatments $Self Care/Home Management : 23-37 mins  Boneta Lucks, COTA/L Acute Rehabilitation [phone]   Alessandra Bevels Lorraine-COTA/L 03/20/2023, 1:27 PM

## 2023-03-20 NOTE — Progress Notes (Signed)
PROGRESS NOTE    Meagan [name]  WUJ:[id number] DOB: 1935/03/11 DOA: 03/16/2023 PCP: [name], [name]    Brief Narrative:  88 y.o. female with medical history significant of pancreatic carcinoma, hypertension, essential tremor, GERD, HLD, HTN, anemia  Admitted for  Biliary obstruction due to Pancreatic adenocarcinoma. MRCP + now s/p ERCP   Assessment and Plan: Biliary obstruction  -MRCP done now s/p ERCP No fever /chills to suggest infection at this time but pro calcitonin is elevated so continue zosyn  -will get palliative care consult after speaking with family at their request -GI consulted: if LFTs do not show improvement in the next day or 2 then we will need to consider that this new mass in the liver could be causing some compression of the duct upstream and may need PTC.   Essential hypertension Continue Bystolic 2.5 mg in the morning and Norvasc 5 mg a day   Mixed hyperlipidemia -resume meds as able   Leukocytosis trend   Iron deficiency anemia - transfuse for hemoglobin of 7  on 10/18 -h/h stable   Pancreatic carcinoma (HCC) Patient will need follow-up with oncology hold Xeloda   Hyponatremia -stable   S/P placement of cardiac pacemaker Hx of pacemaker for bradycardia   Debility -PT/OT   AKI (acute kidney injury) (HCC) on CKD stage IIIa In the setting of dehydration?   -baseline Cr around 1.2 Encourage PO intake-- if not able -bladder scan       DVT prophylaxis: SCDs Start: 03/16/23 2023    Code Status: Full Code Family Communication: at bedside  Disposition Plan:  Level of care: Med-Surg Status is: inpt  Consultants:  GI Palliative care consult   Subjective: Feels well-- is thinking she would not want CPR/intubation but will think about it  Objective: Vitals:   03/19/23 1933 03/19/23 2009 03/20/23 0500 03/20/23 0800  BP: 134/79 120/74 (!) 158/78 (!) 146/75  Pulse: 78 71 77 81  Resp: 16 18 17 18   Temp: 98.1 F (36.7 C)  98.1 F (36.7 C) 98 F (36.7 C) 97.8 F (36.6 C)  TempSrc: Oral Oral Oral Oral  SpO2: 98% 97% 97% 98%  Weight:      Height:        Intake/Output Summary (Last 24 hours) at 03/20/2023 1332 Last data filed at 03/20/2023 9562 Gross per 24 hour  Intake 1728 ml  Output --  Net 1728 ml   Filed Weights   03/16/23 1653 03/16/23 1945  Weight: 58.1 kg 58.1 kg    Examination:    General: Appearance:    elderly female in no acute distress     Lungs:     Clear to auscultation bilaterally  Heart:    Normal heart rate. Normal rhythm.    MS:   All extremities are intact.   Neurologic:   Awake, alert       Data Reviewed: I have personally reviewed following labs and imaging studies  CBC: Recent Labs  Lab 03/16/23 1704 03/17/23 0706 03/19/23 0416 03/20/23 0950  WBC 16.9* 14.1* 15.4* 30.7*  NEUTROABS 14.4*  --   --   --   HGB 8.5* 7.2* 7.0* 9.0*  HCT 26.1* 21.3* 21.8* 27.7*  MCV 100.4* 96.8 97.8 95.8  PLT 385 331 396 474*   Basic Metabolic Panel: Recent Labs  Lab 03/16/23 1704 03/16/23 2146 03/17/23 0706 03/18/23 0657 03/19/23 0416 03/20/23 0950  NA 131*  --  131* 134* 135 134*  K 4.0  --  3.7 3.8 4.1 4.3  CL 103  --  103 102 107 105  CO2 18*  --  18* 19* 19* 18*  GLUCOSE 130*  --  109* 93 142* 195*  BUN 33*  --  30* 28* 25* 29*  CREATININE 1.92*  --  1.82* 1.95* 1.89* 2.23*  CALCIUM 9.1  --  8.9 9.5 9.3 8.8*  MG  --  2.1 2.0  --   --   --   PHOS  --  2.3* 3.9  --   --   --    GFR: Estimated Creatinine Clearance: 16.6 mL/min (A) (by C-G formula based on SCr of 2.23 mg/dL (H)). Liver Function Tests: Recent Labs  Lab 03/16/23 1704 03/17/23 0706 03/18/23 0657 03/19/23 0416 03/20/23 0950  AST 40 34 36 32 31  ALT 63* 53* 52* 47* 49*  ALKPHOS 609* 545* 585* 547* 604*  BILITOT 4.6* 3.6* 3.7* 2.5* 2.2*  PROT 6.4* 5.4* 6.0* 5.4* 6.2*  ALBUMIN 1.9* 1.6* 1.6* <1.5* 1.7*   Recent Labs  Lab 03/16/23 1704  LIPASE 26   No results for input(s): "AMMONIA" in  the last 168 hours. Coagulation Profile: Recent Labs  Lab 03/16/23 1704  INR 1.3*   Cardiac Enzymes: Recent Labs  Lab 03/16/23 2146  CKTOTAL 92   BNP (last 3 results) No results for input(s): "PROBNP" in the last 8760 hours. HbA1C: No results for input(s): "HGBA1C" in the last 72 hours. CBG: No results for input(s): "GLUCAP" in the last 168 hours. Lipid Profile: No results for input(s): "CHOL", "HDL", "LDLCALC", "TRIG", "CHOLHDL", "LDLDIRECT" in the last 72 hours. Thyroid Function Tests: No results for input(s): "TSH", "T4TOTAL", "FREET4", "T3FREE", "THYROIDAB" in the last 72 hours.  Anemia Panel: No results for input(s): "VITAMINB12", "FOLATE", "FERRITIN", "TIBC", "IRON", "RETICCTPCT" in the last 72 hours.  Sepsis Labs: Recent Labs  Lab 03/16/23 2146 03/19/23 0416  PROCALCITON 5.96 1.71    No results found for this or any previous visit (from the past 240 hour(s)).       Radiology Studies: DG ERCP  Result Date: 03/20/2023 CLINICAL DATA:  ERCP for biliary duct obstruction. EXAM: ERCP TECHNIQUE: Multiple spot images obtained with the fluoroscopic device and submitted for interpretation post-procedure. FLUOROSCOPY TIME: FLUOROSCOPY TIME 3 minutes, 27 seconds (38.2 mGy) COMPARISON:  ERCP-05/08/2022; MRCP-03/18/2023 FINDINGS: Four spot intraoperative fluoroscopic images of the right upper abdominal quadrant during ERCP are provided for review Initial image demonstrates an ERCP probe overlying the right upper abdominal quadrant. Pre-existing metallic biliary stent overlies expected location of the CBD. Initial image demonstrates selective cannulation and opacification of the CBD. Near occlusive filling defects are seen throughout the CBD. Subsequent images demonstrate insufflation of a balloon within the central aspect of the CBD with subsequent biliary sweeping and presumed stone and sludge extraction. There is minimal opacification of the intrahepatic biliary tree which  appears nondilated. IMPRESSION: ERCP with biliary stent sweeping as above. These images were submitted for radiologic interpretation only. Please see the procedural report for the amount of contrast and the fluoroscopy time utilized. Electronically Signed   By: Simonne Come M.D.   On: 03/20/2023 07:23        Scheduled Meds:  sodium chloride   Intravenous Once   amLODipine  5 mg Oral Daily   Chlorhexidine Gluconate Cloth  6 each Topical Daily   feeding supplement  237 mL Oral BID BM   gabapentin  100 mg Oral BID   influenza vaccine adjuvanted  0.5 mL Intramuscular Tomorrow-1000   loratadine  10 mg  Oral Daily   nebivolol  2.5 mg Oral Daily   pantoprazole  40 mg Oral Daily   Continuous Infusions:  piperacillin-tazobactam (ZOSYN)  IV 2.25 g (03/20/23 0630)     LOS: 2 days    Time spent: 45 minutes spent on chart review, discussion with nursing staff, consultants, updating family and interview/physical exam; more than 50% of that time was spent in counseling and/or coordination of care.    Joseph Art, DO [facility] Available via Epic secure chat 7am-7pm After these hours, please refer to coverage provider listed on [email or web address] 03/20/2023, 1:32 PM

## 2023-03-20 NOTE — Progress Notes (Signed)
Bladder scanned with noted on scan
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
## 2023-03-20 NOTE — Progress Notes (Addendum)
Attending physician's note   I have taken a history, reviewed the chart, and examined the patient. I performed a substantive portion of this encounter, including complete performance of at least one of the key components, in conjunction with the APP. I agree with the APP's note, impression, and recommendations with my edits.  Feeling better today after ERCP yesterday.  Transfused 1 unit RBCs with robust serologic response and H/H 9/27.7 today.  T. bili 9 downtrending at 2.2 (from 2.5 yesterday).  Remainder of the liver enzymes largely stable.  Discussed yesterday's ERCP, recent imaging, labs with patient and several family members at bedside today.  Thankfully bilirubin downtrending and she is overall feeling better clinically. - Continue daily liver enzymes - Continue ABX for 5 more days - Provided bilirubin continues to downtrend, can follow clinically.  If uptrending, will discuss with IR about possible percutaneous drain to offload pressure above the new left lobe lesion - I expect the alkaline phosphatase will continue to remain elevated for a multitude of reasons and will serve as a less reliable marker of adequate biliary drainage - Inpatient GI service will follow peripherally through the remainder of the weekend.  Please do not hesitate to contact us if new questions or concerns arise in the interim  Meagan Marone, DO, FACG [phone] office         [facility] Progress Note  CC:  biliary obstruction, pancreatic adeno   Subjective:  Feels good.  Ate good breakfast.  No abdominal pain.  Objective:  Vital signs in last 24 hours: Temp:  [97.5 F (36.4 C)-98.1 F (36.7 C)] 97.8 F (36.6 C) (10/19 0800) Pulse Rate:  [65-84] 81 (10/19 0800) Resp:  [12-26] 18 (10/19 0800) BP: (103-158)/(56-79) 146/75 (10/19 0800) SpO2:  [95 %-99 %] 98 % (10/19 0800) Last BM Date : 03/19/23 General:  Alert, Well-developed, in NAD Heart:  Regular rate and rhythm; no  murmurs Pulm:  CTAB.  No W/R/R. Abdomen:  Soft, non-distended.  BS present.  Non-tender. Extremities:  Without edema. Neurologic:  Alert and oriented x 4;  grossly normal neurologically. Psych:  Alert and cooperative. Normal mood and affect.  Intake/Output from previous day: 10/18 0701 - 10/19 0700 In: 1488 [P.O.:240; I.V.:750; Blood:348; IV Piggyback:150] Out: 0  Intake/Output this shift: Total I/O In: 240 [P.O.:240] Out: -   Lab Results: Recent Labs    03/19/23 0416  WBC 15.4*  HGB 7.0*  HCT 21.8*  PLT 396   BMET Recent Labs    03/18/23 0657 03/19/23 0416  NA 134* 135  K 3.8 4.1  CL 102 107  CO2 19* 19*  GLUCOSE 93 142*  BUN 28* 25*  CREATININE 1.95* 1.89*  CALCIUM 9.5 9.3   LFT Recent Labs    03/19/23 0416  PROT 5.4*  ALBUMIN <1.5*  AST 32  ALT 47*  ALKPHOS 547*  BILITOT 2.5*   DG ERCP  Result Date: 03/20/2023 CLINICAL DATA:  ERCP for biliary duct obstruction. EXAM: ERCP TECHNIQUE: Multiple spot images obtained with the fluoroscopic device and submitted for interpretation post-procedure. FLUOROSCOPY TIME: FLUOROSCOPY TIME 3 minutes, 27 seconds (38.2 mGy) COMPARISON:  ERCP-05/08/2022; MRCP-03/18/2023 FINDINGS: Four spot intraoperative fluoroscopic images of the right upper abdominal quadrant during ERCP are provided for review Initial image demonstrates an ERCP probe overlying the right upper abdominal quadrant. Pre-existing metallic biliary stent overlies expected location of the CBD. Initial image demonstrates selective cannulation and opacification of the CBD. Near occlusive filling defects are seen throughout the  CBD. Subsequent images demonstrate insufflation of a balloon within the central aspect of the CBD with subsequent biliary sweeping and presumed stone and sludge extraction. There is minimal opacification of the intrahepatic biliary tree which appears nondilated. IMPRESSION: ERCP with biliary stent sweeping as above. These images were submitted for  radiologic interpretation only. Please see the procedural report for the amount of contrast and the fluoroscopy time utilized. Electronically Signed   By: Simonne Come M.D.   On: 03/20/2023 07:23   MR ABDOMEN MRCP W WO CONTAST  Result Date: 03/18/2023 CLINICAL DATA:  History of pancreatic cancer with biliary obstruction. Previous biliary stenting, most recently 10 months ago. Chemotherapy and radiation therapy completed. Rising CA 19-9 levels and liver function studies suspicious for recurrent biliary obstruction. EXAM: MRI ABDOMEN WITHOUT AND WITH CONTRAST (INCLUDING MRCP) TECHNIQUE: Multiplanar multisequence MR imaging of the abdomen was performed both before and after the administration of intravenous contrast. Heavily T2-weighted images of the biliary and pancreatic ducts were obtained, and three-dimensional MRCP images were rendered by post processing. CONTRAST:  5mL GADAVIST GADOBUTROL 1 MMOL/ML IV SOLN COMPARISON:  Abdominopelvic CT 12/07/2022 and 07/09/2022. Abdominal MRI 12/24/2021. FINDINGS: Despite efforts by the technologist and patient, moderate motion artifact is present on today's exam and could not be eliminated. This reduces exam sensitivity and specificity. The motion progresses throughout the examination. Lower chest: Trace right pleural effusion with mild bibasilar atelectasis. Hepatobiliary: Interval development of a heterogeneous mass or adjacent masses superiorly in the left hepatic lobe, measuring up to 4.8 x 3.8 cm on image 11/3. These masses demonstrate heterogeneous T2 hyperintensity, low T1 signal and restricted diffusion. Following contrast, there is some peripheral enhancement of these lesions, especially the inferior components. No suspicious lesions identified in the right lobe. Mild intrahepatic biliary dilatation has developed in the left lobe. No significant biliary dilatation identified in the right lobe. A metallic biliary stent remains in place; pneumobilia and stent patency  difficult to confirm by MRI. The gallbladder appears unremarkable, without stones, distension or wall thickening. Pancreas: There is a multi-septated cystic lesion in the pancreatic head, adjacent to the biliary stent which measures up to 1.8 x 1.1 cm on image 31/3. This demonstrates no definite enhancement following contrast and may relate to the treated pancreatic malignancy. There is mild pancreatic ductal dilatation. Spleen: Normal in size without focal abnormality. Adrenals/Urinary Tract: Both adrenal glands appear normal. New moderate hydronephrosis and hydroureter with abrupt change in ureteral caliber at the level of the L4-5 disc space (coronal image 20/2). No obvious reader all calculus or surrounding soft tissue mass. No significant collecting system dilatation on the left. There is a small cyst in the upper pole of the left kidney for which no specific follow-up imaging is recommended. Bladder not imaged. Stomach/Bowel: The stomach appears unremarkable for its degree of distension. No evidence of bowel wall thickening, distention or surrounding inflammatory change. Vascular/Lymphatic: There are no enlarged abdominal lymph nodes. The portal, superior mesenteric and splenic veins appear patent. There are small central mesenteric venous collaterals. No acute vascular findings are identified. Aortic and branch vessel atherosclerosis, better seen on CT. Other: A small amount of ascites has developed since the recent CT. There is nonspecific edema throughout the subcutaneous and intra-abdominal fat without focal fluid collection. No peritoneal masses are identified. Musculoskeletal: No acute or significant osseous findings. Mild multilevel spondylosis. IMPRESSION: 1. Interval development of a heterogeneous mass or adjacent masses superiorly in the left hepatic lobe, suspicious for metastatic disease. There is associated new mild  intrahepatic biliary dilatation in the left lobe. 2. Pneumobilia and biliary stent  patency difficult to address by MRI. 3. New moderate hydronephrosis and hydroureter with abrupt change in ureteral caliber at the level of the L4-5 disc space. No obvious obstructing calculus or surrounding soft tissue mass identified. 4. New small amount of ascites and nonspecific subcutaneous and intra-abdominal fat edema. 5. Multi-septated cystic lesion in the pancreatic head may relate to the treated pancreatic malignancy. 6. Current study is motion degraded. In this patient with significant renal insufficiency and presumed desire to avoid iodinated contrast, alternative imaging is limited. However, noncontrast abdominopelvic CT may be helpful to assess biliary stent patency and the cause of the apparent mid right ureteral obstruction. Electronically Signed   By: Carey Bullocks M.D.   On: 03/18/2023 14:15    Assessment / Plan: *88 year old female with pancreatic adenocarcinoma status post biliary stents last in 05/2022.  Has completed chemo and radiation per her report.  Last imaging July.  CA 19-9 higher than previous on recheck last week.  Now with increasing LFTs again, suspect recurrent biliary obstruction/stent occlusion.  MRI/MRCP showed interval development of 4.8 x 3.8 cm mass superiorly in the left hepatic lobe, mild intrahepatic biliary dilation in the left lobe without significant biliary dilatation or concerning features in the right hepatic lobe. Metal CBD stent in place, but difficult to discern stent patency. GB otherwise unremarkable. 1.8 x 1.1 cm multiseptated cystic lesion in the pancreatic head with mild PD dilation. S/p ERCP on 10/18 with debris, sludge, pus removed from the stent.  No additional stenting performed.  -Labs still pending this morning.  Would hope for improvement in LFTs, but if they do not show improvement in the next day or 2 then we will need to consider that this new mass in the liver could be causing some compression of the duct upstream and may need PTC.    LOS:  2 days   Princella Pellegrini. Zehr  03/20/2023, 9:01 AM

## 2023-03-21 DIAGNOSIS — Z515 Encounter for palliative care: Secondary | ICD-10-CM | POA: Diagnosis not present

## 2023-03-21 DIAGNOSIS — K831 Obstruction of bile duct: Secondary | ICD-10-CM | POA: Diagnosis not present

## 2023-03-21 DIAGNOSIS — Z7189 Other specified counseling: Secondary | ICD-10-CM | POA: Diagnosis not present

## 2023-03-21 LAB — CBC
HCT: 26.3 % — ABNORMAL LOW (ref 36.0–46.0)
Hemoglobin: 8.4 g/dL — ABNORMAL LOW (ref 12.0–15.0)
MCH: 30.4 pg (ref 26.0–34.0)
MCHC: 31.9 g/dL (ref 30.0–36.0)
MCV: 95.3 fL (ref 80.0–100.0)
Platelets: 448 10*3/uL — ABNORMAL HIGH (ref 150–400)
RBC: 2.76 MIL/uL — ABNORMAL LOW (ref 3.87–5.11)
RDW: 19.1 % — ABNORMAL HIGH (ref 11.5–15.5)
WBC: 24.4 10*3/uL — ABNORMAL HIGH (ref 4.0–10.5)
nRBC: 0 % (ref 0.0–0.2)

## 2023-03-21 LAB — COMPREHENSIVE METABOLIC PANEL
ALT: 46 U/L — ABNORMAL HIGH (ref 0–44)
AST: 28 U/L (ref 15–41)
Albumin: 1.6 g/dL — ABNORMAL LOW (ref 3.5–5.0)
Alkaline Phosphatase: 555 U/L — ABNORMAL HIGH (ref 38–126)
Anion gap: 8 (ref 5–15)
BUN: 28 mg/dL — ABNORMAL HIGH (ref 8–23)
CO2: 21 mmol/L — ABNORMAL LOW (ref 22–32)
Calcium: 8.9 mg/dL (ref 8.9–10.3)
Chloride: 104 mmol/L (ref 98–111)
Creatinine, Ser: 2.2 mg/dL — ABNORMAL HIGH (ref 0.44–1.00)
GFR, Estimated: 21 mL/min — ABNORMAL LOW (ref >60)
Glucose, Bld: 91 mg/dL (ref 70–99)
Potassium: 4.3 mmol/L (ref 3.5–5.1)
Sodium: 133 mmol/L — ABNORMAL LOW (ref 135–145)
Total Bilirubin: 1.9 mg/dL — ABNORMAL HIGH (ref 0.3–1.2)
Total Protein: 5.7 g/dL — ABNORMAL LOW (ref 6.5–8.1)

## 2023-03-21 MED ORDER — SODIUM CHLORIDE 0.9 % IV SOLN: INTRAVENOUS | Status: AC

## 2023-03-21 NOTE — Progress Notes (Signed)
PROGRESS NOTE    Meagan [name]  NWG:[id number] DOB: 02/05/1935 DOA: 03/16/2023 PCP: [name], [name]    Brief Narrative:  88 y.o. female with medical history significant of pancreatic carcinoma, hypertension, essential tremor, GERD, HLD, HTN, anemia  Admitted for  Biliary obstruction due to Pancreatic adenocarcinoma. MRCP + now s/p ERCP   Assessment and Plan: Biliary obstruction  -MRCP done now s/p ERCP No fever /chills to suggest infection at this time but pro calcitonin is elevated so continue zosyn  -will get palliative care consult after speaking with family at their request -GI consulted: LFTs improving so they have signed off  New liver mass -palliative consult-- if desires treatment will need biopsy and oncology consulted-- can be done as an outpatient   Essential hypertension Continue Bystolic 2.5 mg in the morning and Norvasc 5 mg a day   Mixed hyperlipidemia -resume meds as able   Leukocytosis trend   Iron deficiency anemia - transfuse for hemoglobin of 7  on 10/18 -h/h stable   Pancreatic carcinoma (HCC) Patient will need follow-up with oncology hold Xeloda   Hyponatremia -stable   S/P placement of cardiac pacemaker Hx of pacemaker for bradycardia   Debility -PT/OT   AKI (acute kidney injury) (HCC) on CKD stage IIIa In the setting of dehydration?   -baseline Cr around 1.2 Encourage PO intake-- IVF -bladder scan unremarkable       DVT prophylaxis: SCDs Start: 03/16/23 2023    Code Status: Full Code Family Communication: at bedside  Disposition Plan:  Level of care: Med-Surg Status is: inpt  Consultants:  GI Palliative care consult   Subjective: Eating as well as she can  Objective: Vitals:   03/20/23 1626 03/20/23 2034 03/21/23 0342 03/21/23 0743  BP: (!) 161/93 (!) 156/79 (!) 148/83 (!) 163/87  Pulse: 85 87 83 79  Resp: 18  18 17   Temp: 97.7 F (36.5 C) 98.4 F (36.9 C) 98.3 F (36.8 C) 98.2 F (36.8 C)  TempSrc:  Oral Oral Oral Oral  SpO2: 99% 97% 98% 98%  Weight:      Height:        Intake/Output Summary (Last 24 hours) at 03/21/2023 1106 Last data filed at 03/21/2023 0617 Gross per 24 hour  Intake 1085 ml  Output --  Net 1085 ml   Filed Weights   03/16/23 1653 03/16/23 1945  Weight: 58.1 kg 58.1 kg    Examination:    General: Appearance:    elderly female in no acute distress     Lungs:     No increased work of breathing  Heart:    Normal heart rate. Normal rhythm.    MS:   All extremities are intact.   Neurologic:   Awake, alert       Data Reviewed: I have personally reviewed following labs and imaging studies  CBC: Recent Labs  Lab 03/16/23 1704 03/17/23 0706 03/19/23 0416 03/20/23 0950 03/21/23 0243  WBC 16.9* 14.1* 15.4* 30.7* 24.4*  NEUTROABS 14.4*  --   --   --   --   HGB 8.5* 7.2* 7.0* 9.0* 8.4*  HCT 26.1* 21.3* 21.8* 27.7* 26.3*  MCV 100.4* 96.8 97.8 95.8 95.3  PLT 385 331 396 474* 448*   Basic Metabolic Panel: Recent Labs  Lab 03/16/23 2146 03/17/23 0706 03/18/23 0657 03/19/23 0416 03/20/23 0950 03/21/23 0243  NA  --  131* 134* 135 134* 133*  K  --  3.7 3.8 4.1 4.3 4.3  CL  --  103  102 107 105 104  CO2  --  18* 19* 19* 18* 21*  GLUCOSE  --  109* 93 142* 195* 91  BUN  --  30* 28* 25* 29* 28*  CREATININE  --  1.82* 1.95* 1.89* 2.23* 2.20*  CALCIUM  --  8.9 9.5 9.3 8.8* 8.9  MG 2.1 2.0  --   --   --   --   PHOS 2.3* 3.9  --   --   --   --    GFR: Estimated Creatinine Clearance: 16.8 mL/min (A) (by C-G formula based on SCr of 2.2 mg/dL (H)). Liver Function Tests: Recent Labs  Lab 03/17/23 0706 03/18/23 0657 03/19/23 0416 03/20/23 0950 03/21/23 0243  AST 34 36 32 31 28  ALT 53* 52* 47* 49* 46*  ALKPHOS 545* 585* 547* 604* 555*  BILITOT 3.6* 3.7* 2.5* 2.2* 1.9*  PROT 5.4* 6.0* 5.4* 6.2* 5.7*  ALBUMIN 1.6* 1.6* <1.5* 1.7* 1.6*   Recent Labs  Lab 03/16/23 1704  LIPASE 26   No results for input(s): "AMMONIA" in the last 168  hours. Coagulation Profile: Recent Labs  Lab 03/16/23 1704  INR 1.3*   Cardiac Enzymes: Recent Labs  Lab 03/16/23 2146  CKTOTAL 92   BNP (last 3 results) No results for input(s): "PROBNP" in the last 8760 hours. HbA1C: No results for input(s): "HGBA1C" in the last 72 hours. CBG: No results for input(s): "GLUCAP" in the last 168 hours. Lipid Profile: No results for input(s): "CHOL", "HDL", "LDLCALC", "TRIG", "CHOLHDL", "LDLDIRECT" in the last 72 hours. Thyroid Function Tests: No results for input(s): "TSH", "T4TOTAL", "FREET4", "T3FREE", "THYROIDAB" in the last 72 hours.  Anemia Panel: No results for input(s): "VITAMINB12", "FOLATE", "FERRITIN", "TIBC", "IRON", "RETICCTPCT" in the last 72 hours.  Sepsis Labs: Recent Labs  Lab 03/16/23 2146 03/19/23 0416  PROCALCITON 5.96 1.71    No results found for this or any previous visit (from the past 240 hour(s)).       Radiology Studies: DG ERCP  Result Date: 03/20/2023 CLINICAL DATA:  ERCP for biliary duct obstruction. EXAM: ERCP TECHNIQUE: Multiple spot images obtained with the fluoroscopic device and submitted for interpretation post-procedure. FLUOROSCOPY TIME: FLUOROSCOPY TIME 3 minutes, 27 seconds (38.2 mGy) COMPARISON:  ERCP-05/08/2022; MRCP-03/18/2023 FINDINGS: Four spot intraoperative fluoroscopic images of the right upper abdominal quadrant during ERCP are provided for review Initial image demonstrates an ERCP probe overlying the right upper abdominal quadrant. Pre-existing metallic biliary stent overlies expected location of the CBD. Initial image demonstrates selective cannulation and opacification of the CBD. Near occlusive filling defects are seen throughout the CBD. Subsequent images demonstrate insufflation of a balloon within the central aspect of the CBD with subsequent biliary sweeping and presumed stone and sludge extraction. There is minimal opacification of the intrahepatic biliary tree which appears  nondilated. IMPRESSION: ERCP with biliary stent sweeping as above. These images were submitted for radiologic interpretation only. Please see the procedural report for the amount of contrast and the fluoroscopy time utilized. Electronically Signed   By: Simonne Come M.D.   On: 03/20/2023 07:23        Scheduled Meds:  sodium chloride   Intravenous Once   amLODipine  5 mg Oral Daily   Chlorhexidine Gluconate Cloth  6 each Topical Daily   feeding supplement  237 mL Oral BID BM   gabapentin  100 mg Oral BID   influenza vaccine adjuvanted  0.5 mL Intramuscular Tomorrow-1000   loratadine  10 mg Oral Daily   nebivolol  2.5 mg Oral Daily   pantoprazole  40 mg Oral Daily   Continuous Infusions:  sodium chloride 100 mL/hr at 03/21/23 1002   piperacillin-tazobactam (ZOSYN)  IV 2.25 g (03/21/23 0616)     LOS: 3 days    Time spent: 45 minutes spent on chart review, discussion with nursing staff, consultants, updating family and interview/physical exam; more than 50% of that time was spent in counseling and/or coordination of care.    Joseph Art, DO [facility] Available via Epic secure chat 7am-7pm After these hours, please refer to coverage provider listed on [email or web address] 03/21/2023, 11:06 AM

## 2023-03-21 NOTE — Consult Note (Signed)
Palliative Care Consult Note                                  Date: 03/21/2023   Patient Name: Meagan [name]  DOB: [birth year]  MRN: [id number]  Age / Sex: 88 y.o., female  PCP: [name], [name] Referring Physician: Joseph Art, DO  Reason for Consultation: Establishing goals of care  HPI/Patient Profile: 88 y.o. female  with past medical history of pancreatic carcinoma, hypertension, essential tremor, GERD, HLD, HTN, anemia admitted on 03/16/2023 with biliary obstruction due to pancreatic adenocarcinoma.  MRCP and ERCP done.  She also has a new liver mass.  Past Medical History:  Diagnosis Date   Essential tremor    GERD (gastroesophageal reflux disease)    High cholesterol    Hypertension    PONV (postoperative nausea and vomiting)    Port-A-Cath in place 01/27/2022   Vertigo     Subjective:   I have reviewed medical records including EPIC notes, labs and imaging, assessed the patient and then met at bedside with the patient, her daughter Meagan [name], granddaughter Meagan [name], and grandson Meagan [name] (by phone)  to discuss diagnosis prognosis, GOC, EOL wishes, disposition and options.  I introduced Palliative Medicine as specialized medical care for people living with serious illness. It focuses on providing relief from symptoms and stress of a serious illness. The goal is to improve quality of life for both the patient and the family.  Today's Discussion: Patient and her family have a good understanding of her chronic and current condition. The patient shares she has had chemotherapy and radiation for her pancreatic cancer. The chemo went well but she had difficulty with the radiation. We discussed the new liver mass and the importance of determining if she would like to pursue cancer therapy. We discussed that she would need a biopsy to confirm diagnosis and determine treatment options and this could be done outpatient.   A  discussion was had today regarding advanced directives. Patient's daughter has an old advanced care planning document which we discussed. In the document the patient has decided on code status and scope of care. We reviewed code status. Recommended consideration of DNR status, understanding evidenced-based poor outcomes in similar hospitalized patients, as the cause of the arrest is likely associated with chronic/terminal disease rather than a reversible acute cardio-pulmonary event. Although patient stated in her previous document that she would not want an attempted resuscitation she is now unsure. We also discussed scopes of care and she needs to think more about the options before making a decision. We will revisit while she is hospitalized and can create new documents if needed.  We discussed potentially following up with outpatient palliative care either at the cancer center or in the community.  Discussed the importance of continued conversation with family and the medical providers regarding overall plan of care and treatment options, ensuring decisions are within the context of the patient's values and GOCs.  Questions and concerns were addressed. Hard Choices booklet left for review. The family was encouraged to call with questions or concerns. PMT will continue to support holistically.  Review of Systems  Unable to perform ROS   Objective:   Primary Diagnoses: Present on Admission:  Biliary obstruction  Essential hypertension  Mixed hyperlipidemia  Leukocytosis  Iron deficiency anemia  Pancreatic adenocarcinoma (HCC)  Hyponatremia  S/P placement of cardiac pacemaker  Debility  AKI (acute kidney injury) (HCC)  CKD (chronic kidney disease), stage III (HCC)  Jaundice   Physical Exam Vitals reviewed.  HENT:     Head: Normocephalic and atraumatic.  Cardiovascular:     Rate and Rhythm: Normal rate.  Pulmonary:     Effort: Pulmonary effort is normal.  Skin:    General: Skin  is warm and dry.  Neurological:     Mental Status: She is alert and oriented to person, place, and time.  Psychiatric:        Mood and Affect: Mood normal.        Behavior: Behavior normal.        Thought Content: Thought content normal.        Judgment: Judgment normal.     Vital Signs:  BP (!) 163/87 (BP Location: Right Arm)   Pulse 79   Temp 98.2 F (36.8 C) (Oral)   Resp 17   Ht 5\' 6"  (1.676 m)   Wt 58.1 kg   SpO2 98%   BMI 20.67 kg/m     Advanced Care Planning:   Existing Vynca/ACP Documentation: None in Vynca.   Primary Decision Maker: Patient. If the patient were unable to make decisions her proxy is her daughter Meagan [name].  Code Status/Advance Care Planning: Full code  Assessment & Plan:   SUMMARY OF RECOMMENDATIONS   Full code Full scope Encouraged patient and family to continue discussing goals of care and code status Possible follow up with outpatient palliative Continued PMT support   Discussed with: Dr. Benjamine Mola and bedside RN  Time Total: 75 minutes  Thank you for allowing [place] to participate in the care of [facility] PMT will continue to support holistically.   Signed by: Sarina Ser, NP Palliative Medicine Team  Team Phone # [phone] (Nights/Weekends)  03/21/2023, 3:08 PM

## 2023-03-21 NOTE — Progress Notes (Signed)
   03/21/23 1242  Mobility  Activity Ambulated with assistance to bathroom;Transferred from bed to chair  Level of Assistance Minimal assist, patient does 75% or more  Assistive Device Front wheel walker  Distance Ambulated (ft) 30 ft  Activity Response Tolerated well  Mobility Referral Yes  $Mobility charge 1 Mobility  Mobility Specialist Start Time (ACUTE ONLY) 1150  Mobility Specialist Stop Time (ACUTE ONLY) 1225  Mobility Specialist Time Calculation (min) (ACUTE ONLY) 35 min   Mobility Specialist: Progress Note  Post-Mobility: HR 55-63, SpO2 100% RA  Pt agreeable to mobility session - received in bed. Required MinA using RW. Pt was asymptomatic throughout session with no complaints. Void and watery BM complete, pericare performed independently. Returned to Chair with all needs met - call bell within reach. Chair alarm on.   Barnie Mort, BS Mobility Specialist Please contact via SecureChat or Rehab office at [phone].

## 2023-03-21 NOTE — Progress Notes (Signed)
Pharmacy Antibiotic Note  Meagan [name] is a 88 y.o. female admitted on 03/16/2023. Was seen at [facility] and noted to be jaundiced, found to have elevated bilirubin and concern for biliary obstruction. Notable PMH includes pancreatic cancer (on XRT). GI consulted and is not s/p CRCP on 10/18. Pharmacy has been consulted for Zosyn dosing.  Scr increased to 2.20 (BL ~1.2), CrCl 16.8 mL/min. WBC increased to 24.4, remains afebrile.   Plan: Zosyn 2.25g IV q8 hours  Monitor renal function and adjust Zosyn as needed Follow-up duration of treatment   Height: 5\' 6"  (167.6 cm) Weight: 58.1 kg (128 lb 1.4 oz) IBW/kg (Calculated) : 59.3  Temp (24hrs), Avg:98.2 F (36.8 C), Min:97.7 F (36.5 C), Max:98.4 F (36.9 C)  Recent Labs  Lab 03/16/23 1704 03/17/23 0706 03/18/23 0657 03/19/23 0416 03/20/23 0950 03/21/23 0243  WBC 16.9* 14.1*  --  15.4* 30.7* 24.4*  CREATININE 1.92* 1.82* 1.95* 1.89* 2.23* 2.20*    Estimated Creatinine Clearance: 16.8 mL/min (A) (by C-G formula based on SCr of 2.2 mg/dL (H)).    Allergies  Allergen Reactions   Ivp Dye [Iodinated Contrast Media] Hives   Lisinopril Cough    Antimicrobials this admission: Zosyn 10/16 >>   Dose adjustments this admission: 10/17: Zosyn decreased from 3.375 grams IV Q8h to 2.25 grams IV Q8h  Microbiology results: N/A  Thank you for allowing pharmacy to be a part of this patient's care.  Lennie Muckle, PharmD PGY1 Pharmacy Resident 03/21/2023 10:20 AM

## 2023-03-22 ENCOUNTER — Encounter (HOSPITAL_COMMUNITY): Payer: Self-pay | Admitting: Gastroenterology

## 2023-03-22 DIAGNOSIS — C259 Malignant neoplasm of pancreas, unspecified: Secondary | ICD-10-CM | POA: Diagnosis not present

## 2023-03-22 DIAGNOSIS — K831 Obstruction of bile duct: Secondary | ICD-10-CM | POA: Diagnosis not present

## 2023-03-22 DIAGNOSIS — Z515 Encounter for palliative care: Secondary | ICD-10-CM | POA: Diagnosis not present

## 2023-03-22 LAB — BASIC METABOLIC PANEL
Anion gap: 7 (ref 5–15)
BUN: 30 mg/dL — ABNORMAL HIGH (ref 8–23)
CO2: 19 mmol/L — ABNORMAL LOW (ref 22–32)
Calcium: 8.3 mg/dL — ABNORMAL LOW (ref 8.9–10.3)
Chloride: 104 mmol/L (ref 98–111)
Creatinine, Ser: 2.13 mg/dL — ABNORMAL HIGH (ref 0.44–1.00)
GFR, Estimated: 22 mL/min — ABNORMAL LOW (ref >60)
Glucose, Bld: 93 mg/dL (ref 70–99)
Potassium: 4.3 mmol/L (ref 3.5–5.1)
Sodium: 130 mmol/L — ABNORMAL LOW (ref 135–145)

## 2023-03-22 LAB — CBC
HCT: 25.5 % — ABNORMAL LOW (ref 36.0–46.0)
Hemoglobin: 7.9 g/dL — ABNORMAL LOW (ref 12.0–15.0)
MCH: 30.4 pg (ref 26.0–34.0)
MCHC: 31 g/dL (ref 30.0–36.0)
MCV: 98.1 fL (ref 80.0–100.0)
Platelets: 422 10*3/uL — ABNORMAL HIGH (ref 150–400)
RBC: 2.6 MIL/uL — ABNORMAL LOW (ref 3.87–5.11)
RDW: 19 % — ABNORMAL HIGH (ref 11.5–15.5)
WBC: 16.8 10*3/uL — ABNORMAL HIGH (ref 4.0–10.5)
nRBC: 0 % (ref 0.0–0.2)

## 2023-03-22 NOTE — Progress Notes (Addendum)
   03/22/23 1041  Mobility  Activity Ambulated with assistance to bathroom  Level of Assistance Minimal assist, patient does 75% or more (SV Bed Mobility)  Assistive Device Front wheel walker  Distance Ambulated (ft) 20 ft  Activity Response Tolerated fair  Mobility Referral Yes  $Mobility charge 1 Mobility  Mobility Specialist Start Time (ACUTE ONLY) 0855  Mobility Specialist Stop Time (ACUTE ONLY) 0914  Mobility Specialist Time Calculation (min) (ACUTE ONLY) 19 min   Mobility Specialist: Progress Note  Pt agreeable to mobility session - received in bed. Required SV for bed mobility, MinA STS and ambulation using RW. C/o stomach soreness. Returned to chair with all needs met - call bell within reach. Daughter present.   Barnie Mort, BS Mobility Specialist Please contact via SecureChat or Rehab office at [phone].

## 2023-03-22 NOTE — Plan of Care (Signed)

## 2023-03-22 NOTE — Plan of Care (Signed)
?  Problem: Clinical Measurements: ?Goal: Ability to maintain clinical measurements within normal limits will improve ?Outcome: Progressing ?Goal: Will remain free from infection ?Outcome: Progressing ?Goal: Diagnostic test results will improve ?Outcome: Progressing ?  ?

## 2023-03-22 NOTE — Progress Notes (Signed)
PROGRESS NOTE    Meagan [name]  ZOX:[id number] DOB: July 08, 1934 DOA: 03/16/2023 PCP: [name], [name]    Brief Narrative:  88 y.o. female with medical history significant of pancreatic carcinoma, hypertension, essential tremor, GERD, HLD, HTN, anemia  Admitted for  Biliary obstruction due to Pancreatic adenocarcinoma. MRCP + now s/p ERCP   Assessment and Plan: Biliary obstruction  -MRCP done now s/p ERCP -continue zosyn with stop date placed per GI  -palliative care consulted-- full scope for now-- patient still thinking about it -GI consulted: LFTs improving so they have signed off  New liver mass -palliative consult-- if desires treatment will need biopsy- patient is deciding if she wants treatment and currently prefers to follow up with her oncologist prior to biopsy this hospitalization  Essential hypertension Continue Bystolic 2.5 mg in the morning and Norvasc 5 mg a day   Mixed hyperlipidemia -resume meds as able   Leukocytosis trend   Iron deficiency anemia - transfuse for hemoglobin of 7  on 10/18 -h/h stable   Pancreatic carcinoma (HCC) Patient will need follow-up with oncology   Hyponatremia -stable   S/P placement of cardiac pacemaker Hx of pacemaker for bradycardia   Debility -PT/OT   AKI (acute kidney injury) (HCC) on CKD stage IIIa In the setting of dehydration?   -baseline Cr around 1.2 Encourage PO intake-- IVF -bladder scan unremarkable       DVT prophylaxis: SCDs Start: 03/16/23 2023    Code Status: Full Code Family Communication: at bedside  Disposition Plan:  Level of care: Med-Surg Status is: inpt  Consultants:  GI Palliative care consult   Subjective: Still thinking about the palliative care conversation  Objective: Vitals:   03/21/23 1730 03/21/23 2041 03/22/23 0522 03/22/23 0800  BP: 126/77 (!) 143/71 (!) 175/79 (!) 161/80  Pulse: 74 78 79 75  Resp: 17   16  Temp: (!) 97.2 F (36.2 C) 98.7 F (37.1 C)  98.3 F (36.8 C) 98.4 F (36.9 C)  TempSrc:  Oral Oral Oral  SpO2: 98% 99% 98% 100%  Weight:      Height:        Intake/Output Summary (Last 24 hours) at 03/22/2023 1212 Last data filed at 03/22/2023 0600 Gross per 24 hour  Intake 2144.33 ml  Output 2 ml  Net 2142.33 ml   Filed Weights   03/16/23 1653 03/16/23 1945  Weight: 58.1 kg 58.1 kg    Examination:     General: Appearance:    elderly female in no acute distress     Lungs:     Clear to auscultation bilaterally, respirations unlabored  Heart:    Normal heart rate.    MS:   All extremities are intact.   Neurologic:   Awake, alert      Data Reviewed: I have personally reviewed following labs and imaging studies  CBC: Recent Labs  Lab 03/16/23 1704 03/17/23 0706 03/19/23 0416 03/20/23 0950 03/21/23 0243 03/22/23 0404  WBC 16.9* 14.1* 15.4* 30.7* 24.4* 16.8*  NEUTROABS 14.4*  --   --   --   --   --   HGB 8.5* 7.2* 7.0* 9.0* 8.4* 7.9*  HCT 26.1* 21.3* 21.8* 27.7* 26.3* 25.5*  MCV 100.4* 96.8 97.8 95.8 95.3 98.1  PLT 385 331 396 474* 448* 422*   Basic Metabolic Panel: Recent Labs  Lab 03/16/23 2146 03/17/23 0706 03/18/23 0657 03/19/23 0416 03/20/23 0950 03/21/23 0243 03/22/23 0404  NA  --  131* 134* 135 134* 133* 130*  K  --  3.7 3.8 4.1 4.3 4.3 4.3  CL  --  103 102 107 105 104 104  CO2  --  18* 19* 19* 18* 21* 19*  GLUCOSE  --  109* 93 142* 195* 91 93  BUN  --  30* 28* 25* 29* 28* 30*  CREATININE  --  1.82* 1.95* 1.89* 2.23* 2.20* 2.13*  CALCIUM  --  8.9 9.5 9.3 8.8* 8.9 8.3*  MG 2.1 2.0  --   --   --   --   --   PHOS 2.3* 3.9  --   --   --   --   --    GFR: Estimated Creatinine Clearance: 17.4 mL/min (A) (by C-G formula based on SCr of 2.13 mg/dL (H)). Liver Function Tests: Recent Labs  Lab 03/17/23 0706 03/18/23 0657 03/19/23 0416 03/20/23 0950 03/21/23 0243  AST 34 36 32 31 28  ALT 53* 52* 47* 49* 46*  ALKPHOS 545* 585* 547* 604* 555*  BILITOT 3.6* 3.7* 2.5* 2.2* 1.9*  PROT  5.4* 6.0* 5.4* 6.2* 5.7*  ALBUMIN 1.6* 1.6* <1.5* 1.7* 1.6*   Recent Labs  Lab 03/16/23 1704  LIPASE 26   No results for input(s): "AMMONIA" in the last 168 hours. Coagulation Profile: Recent Labs  Lab 03/16/23 1704  INR 1.3*   Cardiac Enzymes: Recent Labs  Lab 03/16/23 2146  CKTOTAL 92   BNP (last 3 results) No results for input(s): "PROBNP" in the last 8760 hours. HbA1C: No results for input(s): "HGBA1C" in the last 72 hours. CBG: No results for input(s): "GLUCAP" in the last 168 hours. Lipid Profile: No results for input(s): "CHOL", "HDL", "LDLCALC", "TRIG", "CHOLHDL", "LDLDIRECT" in the last 72 hours. Thyroid Function Tests: No results for input(s): "TSH", "T4TOTAL", "FREET4", "T3FREE", "THYROIDAB" in the last 72 hours.  Anemia Panel: No results for input(s): "VITAMINB12", "FOLATE", "FERRITIN", "TIBC", "IRON", "RETICCTPCT" in the last 72 hours.  Sepsis Labs: Recent Labs  Lab 03/16/23 2146 03/19/23 0416  PROCALCITON 5.96 1.71    No results found for this or any previous visit (from the past 240 hour(s)).       Radiology Studies: No results found.      Scheduled Meds:  sodium chloride   Intravenous Once   amLODipine  5 mg Oral Daily   Chlorhexidine Gluconate Cloth  6 each Topical Daily   feeding supplement  237 mL Oral BID BM   gabapentin  100 mg Oral BID   influenza vaccine adjuvanted  0.5 mL Intramuscular Tomorrow-1000   loratadine  10 mg Oral Daily   nebivolol  2.5 mg Oral Daily   pantoprazole  40 mg Oral Daily   Continuous Infusions:  piperacillin-tazobactam (ZOSYN)  IV 2.25 g (03/22/23 0506)     LOS: 4 days    Time spent: 45 minutes spent on chart review, discussion with nursing staff, consultants, updating family and interview/physical exam; more than 50% of that time was spent in counseling and/or coordination of care.    Joseph Art, DO [facility] Available via Epic secure chat 7am-7pm After these hours, please  refer to coverage provider listed on [email or web address] 03/22/2023, 12:12 PM

## 2023-03-22 NOTE — Care Management Important Message (Signed)
Important Message  Patient Details  Name: Meagan [name] MRN: [id number] Date of Birth: 07/28/1934   Important Message Given:  Yes - Medicare IM     Sherilyn Banker 03/22/2023, 4:25 PM

## 2023-03-22 NOTE — Progress Notes (Signed)
Daily Progress Note   Patient Name: Meagan [name]       Date: 03/22/2023 DOB: 1934/11/12  Age: 88 y.o. MRN#: [id number] Attending Physician: Joseph Art, DO Primary Care Physician: [name], [name] Admit Date: 03/16/2023  Reason for Consultation/Follow-up: Establishing goals of care  Patient Profile/HPI:   88 y.o. female  with past medical history of pancreatic carcinoma, hypertension, essential tremor, GERD, HLD, HTN, anemia admitted on 03/16/2023 with biliary obstruction due to pancreatic adenocarcinoma. MRCP and ERCP done.  She also has a new liver mass.  Subjective: Chart reviewed including labs, progress notes, imaging from this and previous encounters.  Met with patient and her daughter. [name] is feeling better today. Was able to ambulate some.  We reviewed her advance directives. She agrees with everything in the document, however, she did not want me to add it to her chart or change her code status until she speaks with her grandson this evening.  Regarding GOC for her pancreatic and liver mass- she and family would like to keep appointment with Dr. Ellin Saba that is scheduled for Tuesday Oct. 29th for further discussion.   Review of Systems  All other systems reviewed and are negative.    Physical Exam Vitals and nursing note reviewed.  Constitutional:      Comments: frail  Pulmonary:     Effort: Pulmonary effort is normal.  Neurological:     Mental Status: She is alert.             Vital Signs: BP (!) 161/80 (BP Location: Right Arm)   Pulse 75   Temp 98.4 F (36.9 C) (Oral)   Resp 16   Ht 5\' 6"  (1.676 m)   Wt 58.1 kg   SpO2 100%   BMI 20.67 kg/m  SpO2: SpO2: 100 % O2 Device: O2 Device: Room Air O2 Flow Rate:    Intake/output summary:   Intake/Output Summary (Last 24 hours) at 03/22/2023 1317 Last data filed at 03/22/2023 0600 Gross per 24 hour  Intake 1904.33 ml  Output 2 ml  Net 1902.33 ml   LBM: Last BM Date : 03/21/23 Baseline Weight: Weight: 58.1 kg Most recent weight: Weight: 58.1 kg       Palliative Assessment/Data: PPS: 50%      Patient Active Problem List   Diagnosis Date Noted  Biliary obstruction 03/16/2023   Hyponatremia 03/16/2023   S/P placement of cardiac pacemaker 03/16/2023   Debility 03/16/2023   AKI (acute kidney injury) (HCC) 03/16/2023   CKD (chronic kidney disease), stage III (HCC) 03/16/2023   Iron deficiency anemia 06/22/2022   Biliary stent obstruction 05/07/2022   Acute metabolic encephalopathy 05/07/2022   Genetic testing 02/11/2022   Port-A-Cath in place 01/27/2022   Pancreatic adenocarcinoma (HCC) 11/11/2021   Hyperbilirubinemia 10/03/2021   Leukocytosis 10/03/2021   Common bile duct stricture    Bile duct obstruction    Jaundice 09/07/2021   Gastroesophageal reflux disease 11/02/2017   Essential hypertension 09/15/2016   Mitral valve regurgitation 09/15/2016   Mixed hyperlipidemia 09/15/2016    Palliative Care Assessment & Plan    Assessment/Recommendations/Plan  Continue current plan of care PMT will f/u with patient tomorrow re: code status and AD documents   Code Status: Full code  Prognosis:  Unable to determine  Discharge Planning: To Be Determined  Care plan was discussed with patient and care team.   Thank you for allowing the Palliative Medicine Team to assist in the care of this patient.  Total time: 60 minutes \ Time includes:   Preparing to see the patient (e.g., review of tests) Obtaining and/or reviewing separately obtained history Performing a medically necessary appropriate examination and/or evaluation Counseling and educating the patient/family/caregiver Ordering medications, tests, or procedures Referring and communicating  with other health care professionals (when not reported separately) Documenting clinical information in the electronic or other health record Independently interpreting results (not reported separately) and communicating results to the patient/family/caregiver Care coordination (not reported separately) Clinical documentation  Ocie Bob, AGNP-C Palliative Medicine   Please contact Palliative Medicine Team phone at [phone] for questions and concerns.

## 2023-03-22 NOTE — Progress Notes (Signed)
Occupational Therapy Treatment Patient Details Name: Meagan [name] MRN: [id number] DOB: November 16, 1934 Today's Date: 03/22/2023   History of present illness 88 yo female admitted with biliary obstruction due to pancreatic adenocarcinoma PMH pancreatic carcinoma, HTN essential tremor GERD HLD HTN Anemia   OT comments  Patient demonstrating good gains with OT treatment with gains in functional transfers. Standing for self care, and LB dressing. Discharge recommendations continue to be appropriate for discharge home with HHOT to follow.       If plan is discharge home, recommend the following:  A little help with walking and/or transfers;A little help with bathing/dressing/bathroom   Equipment Recommendations  None recommended by OT    Recommendations for Other Services      Precautions / Restrictions Precautions Precautions: Fall Restrictions Weight Bearing Restrictions: No       Mobility Bed Mobility Overal bed mobility: Needs Assistance             General bed mobility comments: OOB in recliner    Transfers Overall transfer level: Needs assistance Equipment used: Rolling walker (2 wheels) Transfers: Sit to/from Stand, Bed to chair/wheelchair/BSC Sit to Stand: Contact guard assist           General transfer comment: cues for safety and hand placement     Balance Overall balance assessment: Needs assistance Sitting-balance support: Bilateral upper extremity supported, Feet supported Sitting balance-Leahy Scale: Fair     Standing balance support: Single extremity supported, Bilateral upper extremity supported, During functional activity Standing balance-Leahy Scale: Fair Standing balance comment: stood at sink for grooming tasks with one extremity support                           ADL either performed or assessed with clinical judgement   ADL Overall ADL's : Needs assistance/impaired     Grooming: Wash/dry hands;Wash/dry face;Oral  care;Supervision/safety;Standing               Lower Body Dressing: Minimal assistance;Sitting/lateral leans Lower Body Dressing Details (indicate cue type and reason): able to doff and donn socks with assistance with RLE Toilet Transfer: Supervision/safety;Ambulation;Rolling walker (2 wheels);Grab bars                  Extremity/Trunk Assessment              Vision       Perception     Praxis      Cognition Arousal: Alert Behavior During Therapy: Flat affect, WFL for tasks assessed/performed Overall Cognitive Status: History of cognitive impairments - at baseline                                 General Comments: daughter states that she confuses her with her sister and often calls her by her sisters name        Exercises      Shoulder Instructions       General Comments VSS on RA    Pertinent Vitals/ Pain       Pain Assessment Pain Assessment: No/denies pain  Home Living                                          Prior Functioning/Environment              Frequency  Min  1X/week        Progress Toward Goals  OT Goals(current goals can now be found in the care plan section)  Progress towards OT goals: Progressing toward goals  Acute Rehab OT Goals Patient Stated Goal: go home OT Goal Formulation: With patient/family Time For Goal Achievement: 03/31/23 Potential to Achieve Goals: Good ADL Goals Pt Will Perform Upper Body Dressing: with set-up;sitting Pt Will Perform Lower Body Dressing: with set-up;sit to/from stand Pt Will Transfer to Toilet: with supervision;ambulating;bedside commode Additional ADL Goal #1: pt will demonstrate 10 minutes of activity using energy conservation methods  Plan      Co-evaluation                 AM-PAC OT "6 Clicks" Daily Activity     Outcome Measure   Help from another person eating meals?: None Help from another person taking care of personal grooming?:  None Help from another person toileting, which includes using toliet, bedpan, or urinal?: A Little Help from another person bathing (including washing, rinsing, drying)?: A Little Help from another person to put on and taking off regular upper body clothing?: None Help from another person to put on and taking off regular lower body clothing?: A Little 6 Click Score: 21    End of Session Equipment Utilized During Treatment: Gait belt;Rolling walker (2 wheels)  OT Visit Diagnosis: Unsteadiness on feet (R26.81);Muscle weakness (generalized) (M62.81)   Activity Tolerance Patient tolerated treatment well   Patient Left in chair;with call bell/phone within reach;with family/visitor present;Other (comment) (NP in room)   Nurse Communication Mobility status        Time: 1610-9604 OT Time Calculation (min): 29 min  Charges: OT General Charges $OT Visit: 1 Visit OT Treatments $Self Care/Home Management : 23-37 mins  Alfonse Flavors, OTA Acute Rehabilitation Services  Office [phone]   Dewain Penning 03/22/2023, 1:46 PM

## 2023-03-22 NOTE — Progress Notes (Signed)
Physical Therapy Treatment Patient Details Name: Meagan [name] MRN: [id number] DOB: 07-28-1934 Today's Date: 03/22/2023   History of Present Illness 88 yo female admitted with biliary obstruction due to pancreatic adenocarcinoma PMH pancreatic carcinoma, HTN essential tremor GERD HLD HTN Anemia    PT Comments  Continuing work on functional mobility and activity tolerance;  Session focused on progressive amb, with the goal of getting outside her room, and admiring Fall foliage at the large hallway window;  Overall no difficulty with the walk with RW, and pt seemed to enjoy standing conversation at the window; Pt was able to name lots of family and friends who will be assisting her at home; continue to recommend HHPT follow up    If plan is discharge home, recommend the following: A little help with walking and/or transfers;A little help with bathing/dressing/bathroom;Assistance with cooking/housework;Direct supervision/assist for medications management;Direct supervision/assist for financial management;Assist for transportation;Help with stairs or ramp for entrance;Supervision due to cognitive status   Can travel by private vehicle        Equipment Recommendations  Rolling walker (2 wheels)    Recommendations for Other Services       Precautions / Restrictions Precautions Precautions: Fall Precaution Comments: fall risk is present, but minimized with use of RW Restrictions Weight Bearing Restrictions: No     Mobility  Bed Mobility Overal bed mobility: Needs Assistance Bed Mobility: Rolling, Sidelying to Sit Rolling: Contact guard assist Sidelying to sit: Contact guard assist   Sit to supine: Contact guard assist        Transfers Overall transfer level: Needs assistance Equipment used: Rolling walker (2 wheels) Transfers: Sit to/from Stand Sit to Stand: Contact guard assist           General transfer comment: cues for safety and hand placement     Ambulation/Gait Ambulation/Gait assistance: Contact guard assist (approaching Supervision) Gait Distance (Feet): 70 Feet Assistive device: Rolling walker (2 wheels) Gait Pattern/deviations: Decreased step length - right, Decreased step length - left, Decreased stride length Gait velocity: Decreased     General Gait Details: Pt with slow, though overall steady gait with RW. No overt LOB; Able to engage in conversation while walking   Stairs             Wheelchair Mobility     Tilt Bed    Modified Rankin (Stroke Patients Only)       Balance     Sitting balance-Leahy Scale: Fair       Standing balance-Leahy Scale: Fair Standing balance comment: Stood at [employer] window for quite a while                            Cognition Arousal: Alert Behavior During Therapy: WFL for tasks assessed/performed Overall Cognitive Status: History of cognitive impairments - at baseline                                 General Comments: daughter states that she confuses her with her sister and often calls her by her sisters name        Exercises      General Comments General comments (skin integrity, edema, etc.): NAD on RA      Pertinent Vitals/Pain Pain Assessment Pain Assessment: No/denies pain    Home Living  Prior Function            PT Goals (current goals can now be found in the care plan section) Acute Rehab PT Goals Patient Stated Goal: to go home PT Goal Formulation: With patient Time For Goal Achievement: 03/31/23 Potential to Achieve Goals: Fair Progress towards PT goals: Progressing toward goals    Frequency    Min 1X/week      PT Plan      Co-evaluation              AM-PAC PT "6 Clicks" Mobility   Outcome Measure  Help needed turning from your back to your side while in a flat bed without using bedrails?: A Little Help needed moving from lying on your back to sitting on  the side of a flat bed without using bedrails?: A Little Help needed moving to and from a bed to a chair (including a wheelchair)?: A Little Help needed standing up from a chair using your arms (e.g., wheelchair or bedside chair)?: A Little Help needed to walk in hospital room?: A Little Help needed climbing 3-5 steps with a railing? : A Lot 6 Click Score: 17    End of Session Equipment Utilized During Treatment: Gait belt Activity Tolerance: Patient tolerated treatment well Patient left: in bed;with call bell/phone within reach;with bed alarm set;with family/visitor present Nurse Communication: Mobility status PT Visit Diagnosis: Other abnormalities of gait and mobility (R26.89)     Time: 1600-1630 PT Time Calculation (min) (ACUTE ONLY): 30 min  Charges:    $Gait Training: 23-37 mins PT General Charges $$ ACUTE PT VISIT: 1 Visit                     Van Clines, PT  Acute Rehabilitation Services Office [phone] Secure Chat welcomed    Levi Aland 03/22/2023, 6:09 PM

## 2023-03-23 DIAGNOSIS — K831 Obstruction of bile duct: Secondary | ICD-10-CM | POA: Diagnosis not present

## 2023-03-23 DIAGNOSIS — C259 Malignant neoplasm of pancreas, unspecified: Secondary | ICD-10-CM | POA: Diagnosis not present

## 2023-03-23 DIAGNOSIS — R160 Hepatomegaly, not elsewhere classified: Secondary | ICD-10-CM | POA: Diagnosis not present

## 2023-03-23 DIAGNOSIS — Z7189 Other specified counseling: Secondary | ICD-10-CM | POA: Diagnosis not present

## 2023-03-23 LAB — COMPREHENSIVE METABOLIC PANEL
ALT: 47 U/L — ABNORMAL HIGH (ref 0–44)
AST: 30 U/L (ref 15–41)
Albumin: 1.7 g/dL — ABNORMAL LOW (ref 3.5–5.0)
Alkaline Phosphatase: 520 U/L — ABNORMAL HIGH (ref 38–126)
Anion gap: 12 (ref 5–15)
BUN: 24 mg/dL — ABNORMAL HIGH (ref 8–23)
CO2: 19 mmol/L — ABNORMAL LOW (ref 22–32)
Calcium: 9.4 mg/dL (ref 8.9–10.3)
Chloride: 103 mmol/L (ref 98–111)
Creatinine, Ser: 1.87 mg/dL — ABNORMAL HIGH (ref 0.44–1.00)
GFR, Estimated: 26 mL/min — ABNORMAL LOW (ref >60)
Glucose, Bld: 103 mg/dL — ABNORMAL HIGH (ref 70–99)
Potassium: 4.1 mmol/L (ref 3.5–5.1)
Sodium: 134 mmol/L — ABNORMAL LOW (ref 135–145)
Total Bilirubin: 1.6 mg/dL — ABNORMAL HIGH (ref 0.3–1.2)
Total Protein: 5.8 g/dL — ABNORMAL LOW (ref 6.5–8.1)

## 2023-03-23 LAB — CBC
HCT: 26.4 % — ABNORMAL LOW (ref 36.0–46.0)
Hemoglobin: 8.3 g/dL — ABNORMAL LOW (ref 12.0–15.0)
MCH: 30.9 pg (ref 26.0–34.0)
MCHC: 31.4 g/dL (ref 30.0–36.0)
MCV: 98.1 fL (ref 80.0–100.0)
Platelets: 436 10*3/uL — ABNORMAL HIGH (ref 150–400)
RBC: 2.69 MIL/uL — ABNORMAL LOW (ref 3.87–5.11)
RDW: 18.7 % — ABNORMAL HIGH (ref 11.5–15.5)
WBC: 17.3 10*3/uL — ABNORMAL HIGH (ref 4.0–10.5)
nRBC: 0 % (ref 0.0–0.2)
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
## 2023-03-23 NOTE — Plan of Care (Signed)

## 2023-03-23 NOTE — Progress Notes (Signed)
PROGRESS NOTE    Meagan [name]  RUE:[id number] DOB: [birth year] DOA: 03/16/2023 PCP: [name], [name]    Brief Narrative:  88 y.o. female with medical history significant of pancreatic carcinoma, hypertension, essential tremor, GERD, HLD, HTN, anemia  Admitted for  Biliary obstruction due to Pancreatic adenocarcinoma. MRCP + now s/p ERCP.  Home in 24-48 hours if labs continue to improve.     Assessment and Plan: Biliary obstruction  -MRCP done now s/p ERCP -continue zosyn with stop date placed per GI (through 10/25)  -palliative care consulted-- changed code status to DNR and desires to meet with Dr. Ellin Saba before making decisions about scope of care and further interventions for her liver mass and pancreatic cancer.  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   -baseline Cr around 1.2 Encourage PO intake-- IVF PRN -bladder scan unremarkable       DVT prophylaxis: SCDs Start: 03/16/23 2023    Code Status: Limited: Do not attempt resuscitation (DNR) -DNR-LIMITED -Do Not Intubate/DNI  Family Communication: at bedside  Disposition Plan:  Level of care: Med-Surg Status is: inpt  Consultants:  GI Palliative care  consult   Subjective: Doing well- occasional abdominal pain No dizziness when up walking  Objective: Vitals:   03/22/23 2001 03/23/23 0421 03/23/23 0900 03/23/23 1100  BP: (!) 171/92 (!) 140/87 (!) 160/79   Pulse: 82 81 72   Resp: 18 18 18    Temp: 98.6 F (37 C) 99 F (37.2 C) 98.1 F (36.7 C)   TempSrc: Oral Oral Oral   SpO2: 98% 99% (!) 81% 100%  Weight:      Height:        Intake/Output Summary (Last 24 hours) at 03/23/2023 1223 Last data filed at 03/23/2023 0940 Gross per 24 hour  Intake 680 ml  Output --  Net 680 ml   Filed Weights   03/16/23 1653 03/16/23 1945  Weight: 58.1 kg 58.1 kg    Examination:     General: Appearance:    Frail elderly  female in no acute distress     Lungs:     respirations unlabored  Heart:    Normal heart rate.     MS:   All extremities are intact.   Neurologic:   Awake, alert      Data Reviewed: I have personally reviewed following labs and imaging studies  CBC: Recent Labs  Lab 03/16/23 1704 03/17/23 0706 03/19/23 0416 03/20/23 0950 03/21/23 0243 03/22/23 0404 03/23/23 0307  WBC 16.9*   < > 15.4* 30.7* 24.4* 16.8* 17.3*  NEUTROABS 14.4*  --   --   --   --   --   --   HGB 8.5*   < > 7.0* 9.0* 8.4* 7.9* 8.3*  HCT 26.1*   < >  21.8* 27.7* 26.3* 25.5* 26.4*  MCV 100.4*   < > 97.8 95.8 95.3 98.1 98.1  PLT 385   < > 396 474* 448* 422* 436*   < > = values in this interval not displayed.   Basic Metabolic Panel: Recent Labs  Lab 03/16/23 2146 03/17/23 0706 03/18/23 0657 03/19/23 0416 03/20/23 0950 03/21/23 0243 03/22/23 0404 03/23/23 0307  NA  --  131*   < > 135 134* 133* 130* 134*  K  --  3.7   < > 4.1 4.3 4.3 4.3 4.1  CL  --  103   < > 107 105 104 104 103  CO2  --  18*   < > 19* 18* 21* 19* 19*  GLUCOSE  --  109*   < > 142* 195* 91 93 103*  BUN  --  30*   < > 25* 29* 28* 30* 24*  CREATININE  --  1.82*   < > 1.89* 2.23* 2.20* 2.13* 1.87*  CALCIUM  --  8.9   < > 9.3 8.8* 8.9 8.3* 9.4  MG 2.1 2.0  --   --    --   --   --   --   PHOS 2.3* 3.9  --   --   --   --   --   --    < > = values in this interval not displayed.   GFR: Estimated Creatinine Clearance: 19.8 mL/min (A) (by C-G formula based on SCr of 1.87 mg/dL (H)). Liver Function Tests: Recent Labs  Lab 03/18/23 0657 03/19/23 0416 03/20/23 0950 03/21/23 0243 03/23/23 0307  AST 36 32 31 28 30   ALT 52* 47* 49* 46* 47*  ALKPHOS 585* 547* 604* 555* 520*  BILITOT 3.7* 2.5* 2.2* 1.9* 1.6*  PROT 6.0* 5.4* 6.2* 5.7* 5.8*  ALBUMIN 1.6* <1.5* 1.7* 1.6* 1.7*   Recent Labs  Lab 03/16/23 1704  LIPASE 26   No results for input(s): "AMMONIA" in the last 168 hours. Coagulation Profile: Recent Labs  Lab 03/16/23 1704  INR 1.3*   Cardiac Enzymes: Recent Labs  Lab 03/16/23 2146  CKTOTAL 92   BNP (last 3 results) No results for input(s): "PROBNP" in the last 8760 hours. HbA1C: No results for input(s): "HGBA1C" in the last 72 hours. CBG: No results for input(s): "GLUCAP" in the last 168 hours. Lipid Profile: No results for input(s): "CHOL", "HDL", "LDLCALC", "TRIG", "CHOLHDL", "LDLDIRECT" in the last 72 hours. Thyroid Function Tests: No results for input(s): "TSH", "T4TOTAL", "FREET4", "T3FREE", "THYROIDAB" in the last 72 hours.  Anemia Panel: No results for input(s): "VITAMINB12", "FOLATE", "FERRITIN", "TIBC", "IRON", "RETICCTPCT" in the last 72 hours.  Sepsis Labs: Recent Labs  Lab 03/16/23 2146 03/19/23 0416  PROCALCITON 5.96 1.71    No results found for this or any previous visit (from the past 240 hour(s)).       Radiology Studies: No results found.      Scheduled Meds:  amLODipine  5 mg Oral Daily   Chlorhexidine Gluconate Cloth  6 each Topical Daily   feeding supplement  237 mL Oral BID BM   gabapentin  100 mg Oral BID   influenza vaccine adjuvanted  0.5 mL Intramuscular Tomorrow-1000   loratadine  10 mg Oral Daily   nebivolol  2.5 mg Oral Daily   pantoprazole  40 mg Oral Daily   Continuous  Infusions:  piperacillin-tazobactam (ZOSYN)  IV 2.25 g (03/23/23 0506)     LOS: 5 days    Time  spent: 45 minutes spent on chart review, discussion with nursing staff, consultants, updating family and interview/physical exam; more than 50% of that time was spent in counseling and/or coordination of care.    Joseph Art, DO [facility] Available via Epic secure chat 7am-7pm After these hours, please refer to coverage provider listed on [email or web address] 03/23/2023, 12:23 PM

## 2023-03-23 NOTE — Progress Notes (Signed)
Daily Progress Note   Patient Name: Meagan [name]       Date: 03/23/2023 DOB: 1935/05/04  Age: 88 y.o. MRN#: [id number] Attending Physician: Joseph Art, DO Primary Care Physician: [name], [name] Admit Date: 03/16/2023  Reason for Consultation/Follow-up: Establishing goals of care  Patient Profile/HPI:   88 y.o. female  with past medical history of pancreatic carcinoma, hypertension, essential tremor, GERD, HLD, HTN, anemia admitted on 03/16/2023 with biliary obstruction due to pancreatic adenocarcinoma. MRCP and ERCP done.  She also has a new liver mass.  Subjective: Chart reviewed including labs, progress notes, imaging from this and previous encounters.  Met with patient and her Grandson.  They discussed her Advanced Directives and wish to keep them as is.  She also requests DNR as it states in her Advanced Directives.  We discussed Palliative and Hospice services at home.  She desires to meet with Dr. Ellin Saba before making decisions about scope of care and further interventions for her liver mass and pancreatic cancer.  They would like to be referred to Palliative now, and transition to hospice if she decides not to proceed with further cancer therapy.    Review of Systems  All other systems reviewed and are negative.    Physical Exam Vitals and nursing note reviewed.  Constitutional:      Comments: frail  Pulmonary:     Effort: Pulmonary effort is normal.  Neurological:     Mental Status: She is alert.             Vital Signs: BP (!) 160/79 (BP Location: Left Arm)   Pulse 72   Temp 98.1 F (36.7 C) (Oral)   Resp 18   Ht 5\' 6"  (1.676 m)   Wt 58.1 kg   SpO2 100%   BMI 20.67 kg/m  SpO2: SpO2: 100 % O2 Device: O2 Device: Room Air O2 Flow Rate:     Intake/output summary:  Intake/Output Summary (Last 24 hours) at 03/23/2023 1217 Last data filed at 03/23/2023 0940 Gross per 24 hour  Intake 680 ml  Output --  Net 680 ml   LBM: Last BM Date : 03/22/23 Baseline Weight: Weight: 58.1 kg Most recent weight: Weight: 58.1 kg       Palliative Assessment/Data: PPS: 50%      Patient Active Problem List  Diagnosis Date Noted   Biliary obstruction 03/16/2023   Hyponatremia 03/16/2023   S/P placement of cardiac pacemaker 03/16/2023   Debility 03/16/2023   AKI (acute kidney injury) (HCC) 03/16/2023   CKD (chronic kidney disease), stage III (HCC) 03/16/2023   Iron deficiency anemia 06/22/2022   Biliary stent obstruction 05/07/2022   Acute metabolic encephalopathy 05/07/2022   Genetic testing 02/11/2022   Port-A-Cath in place 01/27/2022   Pancreatic adenocarcinoma (HCC) 11/11/2021   Hyperbilirubinemia 10/03/2021   Leukocytosis 10/03/2021   Common bile duct stricture    Bile duct obstruction    Jaundice 09/07/2021   Gastroesophageal reflux disease 11/02/2017   Essential hypertension 09/15/2016   Mitral valve regurgitation 09/15/2016   Mixed hyperlipidemia 09/15/2016    Palliative Care Assessment & Plan    Assessment/Recommendations/Plan  Continue current plan of care Copy of AD made, to be scanned into chart/VYNCA DNR- Gold form completed and placed on chart Appreciate TOC coordinating Palliative referral for home- family requests [facility]- request that daughter Maralyn Sago be primary contact   Code Status: DNR  Prognosis:  Unable to determine  Discharge Planning: To Be Determined  Care plan was discussed with patient and care team.   Thank you for allowing the Palliative Medicine Team to assist in the care of this patient.  Total time: 50 minutes \ Time includes:   Preparing to see the patient (e.g., review of tests) Obtaining and/or reviewing separately obtained history Performing a medically  necessary appropriate examination and/or evaluation Counseling and educating the patient/family/caregiver Ordering medications, tests, or procedures Referring and communicating with other health care professionals (when not reported separately) Documenting clinical information in the electronic or other health record Independently interpreting results (not reported separately) and communicating results to the patient/family/caregiver Care coordination (not reported separately) Clinical documentation  Ocie Bob, AGNP-C Palliative Medicine   Please contact Palliative Medicine Team phone at [phone] for questions and concerns.

## 2023-03-23 NOTE — Progress Notes (Signed)
Physical Therapy Treatment Patient Details Name: Modie Wess MRN: [id number] DOB: 1935-04-15 Today's Date: 03/23/2023   History of Present Illness 88 yo female admitted with biliary obstruction due to pancreatic adenocarcinoma PMH pancreatic carcinoma, HTN essential tremor GERD HLD HTN Anemia    PT Comments  Continuing work on functional mobility and activity tolerance;  Session focused on porgressive amb in hallway; pt in good spirits, hopes to get home soon; Appreciate TOC's efforts of get HHPT services   If plan is discharge home, recommend the following: A little help with walking and/or transfers;A little help with bathing/dressing/bathroom;Assistance with cooking/housework;Direct supervision/assist for medications management;Direct supervision/assist for financial management;Assist for transportation;Help with stairs or ramp for entrance;Supervision due to cognitive status   Can travel by private vehicle        Equipment Recommendations  Rolling walker (2 wheels)    Recommendations for Other Services       Precautions / Restrictions Precautions Precautions: Fall Precaution Comments: fall risk is present, but minimized with use of RW Restrictions Weight Bearing Restrictions: No     Mobility  Bed Mobility Overal bed mobility: Independent Bed Mobility: Rolling, Supine to Sit, Sit to Supine Rolling: Supervision   Supine to sit: Supervision Sit to supine: Supervision        Transfers Overall transfer level: Independent Equipment used: Rolling walker (2 wheels) Transfers: Sit to/from Stand Sit to Stand: Min assist, +2 safety/equipment           General transfer comment: cues for hand placement on rolling walker    Ambulation/Gait Ambulation/Gait assistance: Contact guard assist Gait Distance (Feet): 75 Feet Assistive device: Rolling walker (2 wheels) Gait Pattern/deviations: Decreased step length - right, Decreased step length - left, Decreased  dorsiflexion - right, Decreased dorsiflexion - left, Decreased stride length, Leaning posteriorly Gait velocity: Decreased     General Gait Details: Pt with slow, though overall steady gait with RW. No overt LOB; Able to engage in conversation while walking   Stairs             Wheelchair Mobility     Tilt Bed    Modified Rankin (Stroke Patients Only)       Balance Overall balance assessment: Needs assistance Sitting-balance support: Bilateral upper extremity supported, Feet supported Sitting balance-Leahy Scale: Fair Sitting balance - Comments: Posterior lean upon sitting Postural control: Posterior lean Standing balance support: Bilateral upper extremity supported, During functional activity Standing balance-Leahy Scale: Fair Standing balance comment: Stood at [employer] window for quite a while                            Cognition Arousal: Alert Behavior During Therapy: WFL for tasks assessed/performed Overall Cognitive Status: History of cognitive impairments - at baseline                                          Exercises      General Comments General comments (skin integrity, edema, etc.): NAD on RA      Pertinent Vitals/Pain Pain Assessment Pain Assessment: No/denies pain    Home Living                          Prior Function            PT Goals (current goals can now be found in the  care plan section) Acute Rehab PT Goals Patient Stated Goal: to go home PT Goal Formulation: With patient Time For Goal Achievement: 03/31/23 Potential to Achieve Goals: Fair Progress towards PT goals: Progressing toward goals    Frequency    Min 1X/week      PT Plan      Co-evaluation              AM-PAC PT "6 Clicks" Mobility   Outcome Measure  Help needed turning from your back to your side while in a flat bed without using bedrails?: A Little Help needed moving from lying on your back to sitting on the  side of a flat bed without using bedrails?: A Little Help needed moving to and from a bed to a chair (including a wheelchair)?: A Little Help needed standing up from a chair using your arms (e.g., wheelchair or bedside chair)?: A Little Help needed to walk in hospital room?: A Little Help needed climbing 3-5 steps with a railing? : A Lot 6 Click Score: 17    End of Session Equipment Utilized During Treatment: Gait belt Activity Tolerance: Patient tolerated treatment well Patient left: in bed;with call bell/phone within reach   PT Visit Diagnosis: Other abnormalities of gait and mobility (R26.89)     Time: 1610-9604 PT Time Calculation (min) (ACUTE ONLY): 31 min  Charges:    $Gait Training: 23-37 mins PT General Charges $$ ACUTE PT VISIT: 1 Visit                     Van Clines, PT  Acute Rehabilitation Services Office [phone] Secure Chat welcomed    Levi Aland 03/23/2023, 6:14 PM

## 2023-03-23 NOTE — TOC Progression Note (Addendum)
Transition of Care (TOC) - Progression Note   Patient active with HHPT with [facility] [phone]. Spoke with [name] at [facility]. [name] will check to see if they can still provide services if patient has palliative care at home, since they are considered an outpatient agency, patient may have to change to a home health agency. [name] will call NCM back shortly . [name] called back. [facility] considered an outpatient agency , but they do provide some in home services for patients, per [facility] patient cannot continue to receive HHPT from [facility] and Palliative Care services at the same time. However, patient can change HHPT to a home health agency like [facility], [facility] , [facility] , [facility] or [facility] home health and receive HHPT and outpatient palliative services.   NCM called Sar[facility] and left message await a call back.   Referral for outpatient palliative services with [facility] [phone] with daughter Maralyn Sago as contact person. Called same , spoke to [facility] , left a message with Sharl Ma PC Nurse , await call back   [facility] with [facility] and Palliative Care returned call. Above explained. She is requesting referral for outpatient palliative care be faxed to her at [phone]. Same done  Patient Details  Name: Meagan [name] MRN: [id number] Date of Birth: 09/11/1934  Transition of Care [facility]) CM/SW Contact  Jeweline Reif, Adria Devon, RN Phone Number: 03/23/2023, 2:40 PM  Clinical Narrative:       Expected Discharge Plan: Home w Home Health Services Barriers to Discharge: Continued Medical Work up  Expected Discharge Plan and Services   Discharge Planning Services: CM Consult Post Acute Care Choice: Home Health Living arrangements for the past 2 months: Single Family Home                 DME Arranged: N/A         HH Arranged: PT HH Agency:  (see note)         Social  Determinants of Health (SDOH) Interventions SDOH Screenings   Food Insecurity: No Food Insecurity (03/16/2023)  Housing: Low Risk  (03/16/2023)  Transportation Needs: No Transportation Needs (03/16/2023)  Utilities: Not At Risk (03/16/2023)  Tobacco Use: Low Risk  (03/19/2023)    Readmission Risk Interventions     No data to display

## 2023-03-24 ENCOUNTER — Other Ambulatory Visit (HOSPITAL_COMMUNITY): Payer: Self-pay

## 2023-03-24 DIAGNOSIS — K831 Obstruction of bile duct: Secondary | ICD-10-CM | POA: Diagnosis not present

## 2023-03-24 LAB — COMPREHENSIVE METABOLIC PANEL
ALT: 47 U/L — ABNORMAL HIGH (ref 0–44)
AST: 31 U/L (ref 15–41)
Albumin: 1.7 g/dL — ABNORMAL LOW (ref 3.5–5.0)
Alkaline Phosphatase: 515 U/L — ABNORMAL HIGH (ref 38–126)
Anion gap: 7 (ref 5–15)
BUN: 23 mg/dL (ref 8–23)
CO2: 21 mmol/L — ABNORMAL LOW (ref 22–32)
Calcium: 9.1 mg/dL (ref 8.9–10.3)
Chloride: 104 mmol/L (ref 98–111)
Creatinine, Ser: 1.76 mg/dL — ABNORMAL HIGH (ref 0.44–1.00)
GFR, Estimated: 28 mL/min — ABNORMAL LOW (ref >60)
Glucose, Bld: 103 mg/dL — ABNORMAL HIGH (ref 70–99)
Potassium: 4 mmol/L (ref 3.5–5.1)
Sodium: 132 mmol/L — ABNORMAL LOW (ref 135–145)
Total Bilirubin: 1.7 mg/dL — ABNORMAL HIGH (ref 0.3–1.2)
Total Protein: 5.6 g/dL — ABNORMAL LOW (ref 6.5–8.1)

## 2023-03-24 LAB — CBC
HCT: 25.3 % — ABNORMAL LOW (ref 36.0–46.0)
Hemoglobin: 7.8 g/dL — ABNORMAL LOW (ref 12.0–15.0)
MCH: 30.2 pg (ref 26.0–34.0)
MCHC: 30.8 g/dL (ref 30.0–36.0)
MCV: 98.1 fL (ref 80.0–100.0)
Platelets: 438 10*3/uL — ABNORMAL HIGH (ref 150–400)
RBC: 2.58 MIL/uL — ABNORMAL LOW (ref 3.87–5.11)
RDW: 18.7 % — ABNORMAL HIGH (ref 11.5–15.5)
WBC: 15 10*3/uL — ABNORMAL HIGH (ref 4.0–10.5)
nRBC: 0 % (ref 0.0–0.2)

## 2023-03-24 MED ORDER — AMOXICILLIN-POT CLAVULANATE 500-125 MG PO TABS
1.0000 | ORAL_TABLET | Freq: Two times a day (BID) | ORAL | Status: DC
  Administered 2023-03-24: 1 via ORAL
  Filled 2023-03-24 (×2): qty 1

## 2023-03-24 MED ORDER — AMOXICILLIN-POT CLAVULANATE 500-125 MG PO TABS
1.0000 | ORAL_TABLET | Freq: Two times a day (BID) | ORAL | 0 refills | Status: AC
  Filled 2023-03-24: qty 6, 3d supply, fill #0

## 2023-03-24 MED ORDER — HEPARIN SOD (PORK) LOCK FLUSH 100 UNIT/ML IV SOLN
500.0000 [IU] | INTRAVENOUS | Status: AC | PRN
  Administered 2023-03-24: 500 [IU]

## 2023-03-24 MED ORDER — ACETAMINOPHEN 500 MG PO TABS: 500.0000 mg | ORAL_TABLET | Freq: Four times a day (QID) | ORAL | Status: DC | PRN

## 2023-03-24 MED ORDER — AMOXICILLIN-POT CLAVULANATE 875-125 MG PO TABS: 1.0000 | ORAL_TABLET | Freq: Two times a day (BID) | ORAL | Status: DC

## 2023-03-24 NOTE — Progress Notes (Signed)
PHARMACY NOTE:  ANTIMICROBIAL RENAL DOSAGE ADJUSTMENT  Current antimicrobial regimen includes a mismatch between antimicrobial dosage and estimated renal function.  As per policy approved by the Pharmacy & Therapeutics and Medical Executive Committees, the antimicrobial dosage will be adjusted accordingly.  Current antimicrobial dosage:  amox-clav 875-125mg  q12hr  Indication: Biliary obstruction   Renal Function:  Estimated Creatinine Clearance: 21 mL/min (A) (by C-G formula based on SCr of 1.76 mg/dL (H)).      Antimicrobial dosage has been changed to:  500-125mg  q12hr   Additional comments:   Thank you for allowing pharmacy to be a part of this patient's care.  Alphia Moh, PharmD, BCPS, BCCP Clinical Pharmacist  Please check AMION for all [facility] Pharmacy phone numbers After 10:00 PM, call Main Pharmacy [phone]

## 2023-03-24 NOTE — Progress Notes (Signed)
Nutrition Follow-up  DOCUMENTATION CODES:   Not applicable  INTERVENTION:  Liberalize diet to regular Ensure Enlive po BID, each supplement provides 350 kcal and 20 grams of protein. Magic cup TID with meals, each supplement provides 290 kcal and 9 grams of protein  NUTRITION DIAGNOSIS:   Increased nutrient needs related to chronic illness, cancer and cancer related treatments as evidenced by estimated needs. - remains applicable  GOAL:   Patient will meet greater than or equal to 90% of their needs - progressing  MONITOR:   PO intake, Supplement acceptance, Labs, Weight trends  REASON FOR ASSESSMENT:   Consult Assessment of nutrition requirement/status  ASSESSMENT:   Pt admitted d/t elevated bilirubin concerning for bilary obstruction. PMH significant for pancreatic carcinoma, HTN, essential tremor, GERD, HLD, HTN and anemia.  10/16: s/p MRCP-  interval development of 4.8 x 3.8 cm mass superiorly in the left hepatic lobe, mild intrahepatic biliary dilation in the left lobe without significant biliary dilatation or concerning features in the right hepatic lobe  10/18: s/p ERCP-  debris, sludge, pus removed from the stent  Spoke with pt at bedside. She reports that her appetite and intake has improved since prior to admission. She continues consuming Ensure supplements at least once per day. She has been eating the Magic cup and likes these supplements.  Meal completions: 10/20: 100% x 3 recorded meals 10/22: 50% breakfast, 75% lunch  No updated weights on file to review.   Medications: protonix, IV abx  Labs: sodium 132, Cr 1.76, alkaline phos 515, ALT 47, tbili 1.7, GFR 28  Diet Order:   Diet Order             Diet Heart Room service appropriate? Yes; Fluid consistency: Thin  Diet effective now                   EDUCATION NEEDS:   Education needs have been addressed  Skin:  Skin Assessment: Reviewed RN Assessment  Last BM:  10/15  Height:   Ht  Readings from Last 1 Encounters:  03/16/23 5\' 6"  (1.676 m)    Weight:   Wt Readings from Last 1 Encounters:  03/16/23 58.1 kg   BMI:  Body mass index is 20.67 kg/m.  Estimated Nutritional Needs:   Kcal:  1500-1700  Protein:  75-100g  Fluid:  >/=1.5L  Drusilla Kanner, RDN, LDN Clinical Nutrition

## 2023-03-24 NOTE — Plan of Care (Signed)
  Problem: Clinical Measurements: Goal: Ability to maintain clinical measurements within normal limits will improve Outcome: Progressing Goal: Will remain free from infection Outcome: Progressing   

## 2023-03-24 NOTE — Discharge Summary (Signed)
DISCHARGE SUMMARY  [name]  MR#: [id number]  DOB:11/22/1934  Date of Admission: 03/16/2023 Date of Discharge: 03/24/2023  Attending Physician:Nyeema Want Silvestre Gunner, MD  Patient's ZSW:FUXNA, Meagan Manis, [name]  Disposition: D/C home   Follow-up Appts:  Follow-up Information     [facility] Follow up.   Why: [facility] Contact information: [address] [place] [address] [phone]         [facility] Follow up.   Contact information: phone [phone]  Palliative Care                Discharge Diagnoses: Biliary obstruction due to pancreatic adenocarcinoma Goals of care discussion/planning  New liver mass HTN Mixed HLD Iron deficiency anemia Hyponatremia AKI on CKD stage IIIa  Initial presentation: 88 year old with a history of pancreatic carcinoma, HTN, essential tremor, HDL, HTN, and chronic anemia who was admitted to the hospital 10/15 due to biliary obstruction from her pancreatic adenocarcinoma. She underwent MRCP and ERCP during her hospital stay.   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6* 1.7* 1.7*         Goals of care Palliative Care met with the patient -she has  changed her CODE STATUS to DNR and desires to meet with Dr. Ellin Saba at [facility] before making decisions about scope of care and possible further interventions for her liver mass and pancreatic cancer   New liver mass Suspected metastasis of her known pancreatic cancer -if desires treatment will need a biopsy -as noted above patient wishes to discuss further with her oncologist at [facility]   HTN BP reasonably well-controlled   Mixed HLD Ongoing medical therapy in this clinical situation likely of little benefit and with elevated LFTs medication has been discontinued   Iron deficiency anemia Required transfusion of PRBC during this admission   Hyponatremia Sodium is stable at her apparent baseline   AKI on CKD stage IIIa Due to simple volume depletion -creatinine has improved to approximately 1.8 and is stable    Allergies as of 03/24/2023       Reactions   Ivp Dye [iodinated Contrast Media] Hives   Lisinopril Cough        Medication List     STOP taking these medications    predniSONE 50 MG tablet Commonly known as: DELTASONE       TAKE these medications    acetaminophen 500 MG tablet Commonly known as: TYLENOL Take 500 mg by mouth as needed for mild pain (pain score 1-3) or moderate pain (pain score 4-6).   amLODipine 5 MG tablet Commonly known as: NORVASC Take 5 mg by mouth at bedtime.   amoxicillin-clavulanate 500-125 MG tablet Commonly known as: AUGMENTIN Take 1 tablet by  mouth every 12 (twelve) hours for 3 days.   aspirin EC 81 MG tablet Take 81 mg by mouth in the morning. Swallow whole.   cetirizine 10 MG tablet Commonly known as: ZYRTEC Take 10 mg by mouth as needed for allergies.   Co Q-10 100 MG Caps Take 100 mg by mouth at bedtime.   diphenhydrAMINE 50 MG tablet Commonly known as: BENADRYL Take 1 tablet (50 mg total) by mouth once for 1 dose. Take 1 hour prior to scan   famotidine 40 MG tablet Commonly known as:  PEPCID Take 40 mg by mouth every evening.   gabapentin 100 MG capsule Commonly known as: NEURONTIN Take 100 mg by mouth 2 (two) times daily.   Joint Support Caps Take 1 capsule by mouth daily with breakfast.   lidocaine-prilocaine cream Commonly known as: EMLA Apply 1 Application topically as needed (port).   meclizine 25 MG tablet Commonly known as: ANTIVERT Take 25 mg by mouth daily as needed for dizziness.   megestrol 400 MG/10ML suspension Commonly known as: MEGACE Take 10 mLs (400 mg total) by mouth 2 (two) times daily.   multivitamin tablet Take 1 tablet by mouth daily with breakfast.   nebivolol 5 MG tablet Commonly known as: BYSTOLIC Take 2.5 mg by mouth in the morning.   omeprazole 40 MG capsule Commonly known as: PRILOSEC Take 40 mg by mouth daily before breakfast.   polyethylene glycol 17 g packet Commonly known as: MiraLax Take 17 g by mouth daily as needed. What changed: reasons to take this   PRESERVISION AREDS PO Take 1 tablet by mouth in the morning and at bedtime.   primidone 50 MG tablet Commonly known as: MYSOLINE Take 50 mg by mouth daily as needed (for tremors).   pyridOXINE 100 MG tablet Commonly known as: VITAMIN B6 Take 100 mg by mouth in the morning.   REFRESH OP Place 1 drop into both eyes daily as needed (tired eyes).   tiZANidine 2 MG tablet Commonly known as: ZANAFLEX Take 1 mg by mouth in the morning.   Vitamin D3 50 MCG (2000 UT) Tabs Take 2,000 Units by mouth at bedtime.        Day of Discharge BP (!) 163/74 (BP Location: Right Arm)   Pulse 70   Temp 98.1 F (36.7 C) (Oral)   Resp 16   Ht 5\' 6"  (1.676 m)   Wt 58.1 kg   SpO2 100%   BMI 20.67 kg/m   Physical Exam: General: No acute respiratory distress Lungs: Clear to auscultation bilaterally without wheezes or crackles Cardiovascular: Regular rate and rhythm without murmur gallop or rub normal S1 and S2 Abdomen: Nontender, nondistended, soft, bowel sounds  positive, no rebound, no ascites, no appreciable mass Extremities: No significant cyanosis, clubbing, or edema bilateral lower extremities  Basic Metabolic Panel: Recent Labs  Lab 03/20/23 0950 03/21/23 0243 03/22/23 0404 03/23/23 0307 03/24/23 0336  NA 134* 133* 130* 134* 132*  K 4.3 4.3 4.3 4.1 4.0  CL 105 104 104 103 104  CO2 18* 21* 19* 19* 21*  GLUCOSE 195* 91 93 103* 103*  BUN 29* 28* 30* 24* 23  CREATININE 2.23* 2.20* 2.13* 1.87* 1.76*  CALCIUM 8.8* 8.9 8.3* 9.4 9.1    CBC: Recent Labs  Lab 03/20/23 0950 03/21/23 0243 03/22/23 0404 03/23/23 0307 03/24/23 0336  WBC 30.7* 24.4* 16.8* 17.3* 15.0*  HGB 9.0* 8.4* 7.9* 8.3* 7.8*  HCT 27.7* 26.3* 25.5* 26.4* 25.3*  MCV 95.8 95.3 98.1 98.1  98.1  PLT 474* 448* 422* 436* 438*    Time spent in discharge (includes decision making & examination of pt): 35 minutes  03/24/2023, 2:04 PM   Lonia Blood, MD [facility] Office  [phone]

## 2023-03-24 NOTE — Plan of Care (Signed)
  Problem: Education: Goal: Knowledge of General Education information will improve Description: Including pain rating scale, medication(s)/side effects and non-pharmacologic comfort measures 03/24/2023 1420 by Kelli Hope, RN Outcome: Adequate for Discharge 03/24/2023 1420 by Kelli Hope, RN Outcome: Progressing   Problem: Health Behavior/Discharge Planning: Goal: Ability to manage health-related needs will improve 03/24/2023 1420 by Kelli Hope, RN Outcome: Adequate for Discharge 03/24/2023 1420 by Kelli Hope, RN Outcome: Progressing   Problem: Clinical Measurements: Goal: Ability to maintain clinical measurements within normal limits will improve 03/24/2023 1420 by Kelli Hope, RN Outcome: Adequate for Discharge 03/24/2023 1420 by Kelli Hope, RN Outcome: Progressing Goal: Will remain free from infection 03/24/2023 1420 by Kelli Hope, RN Outcome: Adequate for Discharge 03/24/2023 1420 by Kelli Hope, RN Outcome: Progressing Goal: Diagnostic test results will improve 03/24/2023 1420 by Kelli Hope, RN Outcome: Adequate for Discharge 03/24/2023 1420 by Kelli Hope, RN Outcome: Progressing Goal: Respiratory complications will improve 03/24/2023 1420 by Kelli Hope, RN Outcome: Adequate for Discharge 03/24/2023 1420 by Kelli Hope, RN Outcome: Progressing Goal: Cardiovascular complication will be avoided 03/24/2023 1420 by Kelli Hope, RN Outcome: Adequate for Discharge 03/24/2023 1420 by Kelli Hope, RN Outcome: Progressing   Problem: Activity: Goal: Risk for activity intolerance will decrease 03/24/2023 1420 by Kelli Hope, RN Outcome: Adequate for Discharge 03/24/2023 1420 by Kelli Hope, RN Outcome: Progressing   Problem: Nutrition: Goal: Adequate nutrition will be maintained 03/24/2023 1420 by Kelli Hope, RN Outcome: Adequate for Discharge 03/24/2023 1420 by Kelli Hope, RN Outcome: Progressing   Problem: Coping: Goal: Level of anxiety will decrease 03/24/2023 1420 by Kelli Hope, RN Outcome: Adequate for Discharge 03/24/2023 1420 by Kelli Hope, RN Outcome: Progressing   Problem: Elimination: Goal: Will not experience complications related to bowel motility 03/24/2023 1420 by Kelli Hope, RN Outcome: Adequate for Discharge 03/24/2023 1420 by Kelli Hope, RN Outcome: Progressing Goal: Will not experience complications related to urinary retention 03/24/2023 1420 by Kelli Hope, RN Outcome: Adequate for Discharge 03/24/2023 1420 by Kelli Hope, RN Outcome: Progressing   Problem: Pain Managment: Goal: General experience of comfort will improve 03/24/2023 1420 by Kelli Hope, RN Outcome: Adequate for Discharge 03/24/2023 1420 by Kelli Hope, RN Outcome: Progressing   Problem: Safety: Goal: Ability to remain free from injury will improve 03/24/2023 1420 by Kelli Hope, RN Outcome: Adequate for Discharge 03/24/2023 1420 by Kelli Hope, RN Outcome: Progressing   Problem: Skin Integrity: Goal: Risk for impaired skin integrity will decrease 03/24/2023 1420 by Kelli Hope, RN Outcome: Adequate for Discharge 03/24/2023 1420 by Kelli Hope, RN Outcome: Progressing

## 2023-03-24 NOTE — TOC Progression Note (Addendum)
Transition of Care (TOC) - Progression Note   Spoke to patient and grandson at bedside. Meagan [name] had a cataract procedure yesterday . Explained to patient and grandson [name] [place] [facility] is an outpatient agency and to have HHPT and Palliative Care they will need to change from [facility] to a home health agency. Provided medicare.gov list of agencies .   Grandson voiced understanding and will have Sarah call NCM   Received voicemails from Meagan [name]. She has spoken with [name] and confirmed what NCM was told yesterday .   Her choices for home health are [facility] or [facility] .   NCM called Elnita Maxwell with [facility] and Elnita Maxwell accepted referral.   Called Sarah back and left voicemail . Meagan [name] has texted NCM asking if her mother will discharge today and if MD could call her. NCM secure chatted Dr Sharon Seller , who plans to round around 2 pm , and plan is to discharge today.   Updated Sarah.   Also updated Megan [phone] with [facility] and Palliative Care   Left message for [place] [facility] [place] [facility] [phone]  Patient Details  Name: Meagan [name] MRN: [id number] Date of Birth: 1935/04/24  Transition of Care [facility]) CM/SW Contact  Arieanna Pressey, Adria Devon, RN Phone Number: 03/24/2023, 10:01 AM  Clinical Narrative:       Expected Discharge Plan: Home w Home Health Services Barriers to Discharge: Continued Medical Work up  Expected Discharge Plan and Services   Discharge Planning Services: CM Consult Post Acute Care Choice: Home Health Living arrangements for the past 2 months: Single Family Home                 DME Arranged: N/A         HH Arranged: PT HH Agency:  (see note)         Social Determinants of Health (SDOH) Interventions SDOH Screenings   Food Insecurity: No Food Insecurity (03/16/2023)  Housing: Low Risk  (03/16/2023)  Transportation Needs: No Transportation Needs (03/16/2023)  Utilities: Not At Risk  (03/16/2023)  Tobacco Use: Low Risk  (03/19/2023)    Readmission Risk Interventions     No data to display

## 2023-03-24 NOTE — Progress Notes (Signed)
Physical Therapy Treatment Patient Details Name: Yulisa Bobby MRN: [id number] DOB: 1934-07-07 Today's Date: 03/24/2023   History of Present Illness 88 yo female admitted with biliary obstruction due to pancreatic adenocarcinoma PMH pancreatic carcinoma, HTN essential tremor GERD HLD HTN Anemia    PT Comments  Continuing work on functional mobility and activity tolerance;  session focused on functional transfers and amb, as well as going up and down 1 step in prep for getting home; Pt was able to stand safely from bed and commode in bathroom safely without physical assist, and particiapted in therex of serial sit<>stand practice; no difficulty managing a step; OK for dc home from PT standpoint; pt seems encouraged by progress and looks forward to getting home   If plan is discharge home, recommend the following: A little help with walking and/or transfers;A little help with bathing/dressing/bathroom;Assistance with cooking/housework;Direct supervision/assist for medications management;Direct supervision/assist for financial management;Assist for transportation;Help with stairs or ramp for entrance;Supervision due to cognitive status   Can travel by private vehicle        Equipment Recommendations  Rolling walker (2 wheels)    Recommendations for Other Services       Precautions / Restrictions Precautions Precautions: Fall Precaution Comments: fall risk is present, but minimized with use of RW Restrictions Weight Bearing Restrictions: No     Mobility  Bed Mobility Overal bed mobility: Independent Bed Mobility: Rolling, Supine to Sit, Sit to Supine Rolling: Supervision Sidelying to sit: Independent   Sit to supine: Independent   General bed mobility comments: patient is able to move in and out of bed with assistance of 2 wheel walker    Transfers Overall transfer level: Needs assistance Equipment used: Rolling walker (2 wheels) Transfers: Sit to/from Stand Sit to Stand:  Contact guard assist, Modified independent (Device/Increase time)           General transfer comment: cues for hand placement for push off and sitting down    Ambulation/Gait Ambulation/Gait assistance: Contact guard assist, Modified independent (Device/Increase time) Gait Distance (Feet): 75 Feet Assistive device: Rolling walker (2 wheels) Gait Pattern/deviations: Decreased step length - right, Decreased step length - left, Decreased dorsiflexion - right, Decreased dorsiflexion - left, Decreased stride length, Leaning posteriorly Gait velocity: Decreased     General Gait Details: Pt with slow, though overall steady gait with RW. No overt LOB; Able to engage in conversation while walking   Stairs Stairs: Yes Stairs assistance: Contact guard assist, Modified independent (Device/Increase time) Stair Management: With walker, Forwards, Backwards Number of Stairs: 1 General stair comments: pt was able to get up and down from a single step with either leg. Patient stated she does down backwards at home   Wheelchair Mobility     Tilt Bed    Modified Rankin (Stroke Patients Only)       Balance Overall balance assessment: Modified Independent Sitting-balance support: Bilateral upper extremity supported, Feet supported Sitting balance-Leahy Scale: Fair Sitting balance - Comments: Posterior lean upon sitting, pt able to control descent onto bed and commode Postural control: Posterior lean Standing balance support: Bilateral upper extremity supported, During functional activity Standing balance-Leahy Scale: Fair Standing balance comment: Stood at [employer] window for quite a while; went up and down singular step with 2-wheel walker assistance                            Cognition Arousal: Alert Behavior During Therapy: WFL for tasks assessed/performed Overall Cognitive Status: History  of cognitive impairments - at baseline                                           Exercises Other Exercises Other Exercises: sit-to-stand; on bed and on commode (7 reps total) Other Exercises: step ups on singular step (2 reps)    General Comments        Pertinent Vitals/Pain Pain Assessment Pain Assessment: No/denies pain    Home Living                          Prior Function            PT Goals (current goals can now be found in the care plan section) Acute Rehab PT Goals Patient Stated Goal: to go home PT Goal Formulation: With patient Time For Goal Achievement: 03/31/23 Potential to Achieve Goals: Fair Progress towards PT goals: Progressing toward goals    Frequency    Min 1X/week      PT Plan      Co-evaluation              AM-PAC PT "6 Clicks" Mobility   Outcome Measure  Help needed turning from your back to your side while in a flat bed without using bedrails?: A Little Help needed moving from lying on your back to sitting on the side of a flat bed without using bedrails?: A Little Help needed moving to and from a bed to a chair (including a wheelchair)?: A Little Help needed standing up from a chair using your arms (e.g., wheelchair or bedside chair)?: A Little Help needed to walk in hospital room?: A Little Help needed climbing 3-5 steps with a railing? : A Lot 6 Click Score: 17    End of Session Equipment Utilized During Treatment: Gait belt Activity Tolerance: Patient tolerated treatment well Patient left: in bed;with call bell/phone within reach   PT Visit Diagnosis: Other abnormalities of gait and mobility (R26.89)                    03/24/23 1026  PT Time Calculation  PT Start Time (ACUTE ONLY) 1250  PT Stop Time (ACUTE ONLY) 1327  PT Time Calculation (min) (ACUTE ONLY) 37 min  PT General Charges  $$ ACUTE PT VISIT 1 Visit  PT Evaluation  $PT Eval Moderate Complexity 1 Mod  PT Treatments  $Gait Training 23-37 mins               Van Clines, PT  Acute Rehabilitation  Services Office [phone] Secure Chat welcomed    Levi Aland 03/24/2023, 4:50 PM

## 2023-03-30 ENCOUNTER — Inpatient Hospital Stay: Payer: Medicare HMO | Admitting: Hematology

## 2023-03-30 ENCOUNTER — Telehealth: Payer: Self-pay | Admitting: *Deleted

## 2023-03-30 ENCOUNTER — Encounter: Payer: Self-pay | Admitting: Family Medicine

## 2023-03-30 NOTE — Telephone Encounter (Signed)
Daughter called to cancel appointment for today.  She is declining and unable to get out of the bed at this point.  They are going to contact Hospice and will call back to reschedule if anything changes.

## 2023-03-30 NOTE — Progress Notes (Incomplete)
[facility] [address], [place] [address]    Clinic Day:  03/30/2023  Referring physician: [name], [name]  Patient Care Team: [name], [name] as PCP - General (Family Medicine) [name], MD as Medical Oncologist (Medical Oncology) [name], RN as Oncology Nurse Navigator (Medical Oncology)   ASSESSMENT & PLAN:   Assessment: 1. T2 N0 M0 pancreatic adenocarcinoma: - She presented with painless jaundice in April 2023.  Weight loss of 13 pounds since April. - Stent placement on 09/10/2021 for a bilirubin of 8.2. - 10/03/2021: ERCP and stent removal and stent placement for stent dysfunction with total bilirubin 16.3. - 10/29/2021: ERCP/EUS: Lower CBD stent stenosis from stricture, status post metal stent placement.  EUS showed mass in the superior pancreatic head measuring 2.8 x 2.2 cm, suggesting abutment of the portal vein.  Intact interface was seen between lesion and SMA and celiac trunk suggesting lack of invasion.  Hypoechoic 14 x 12 mm lesion visualized in the liver.  Biopsy could not be attempted due to presence of blood vessel. - She had syncopal episodes in 11-Jun-2022, had pacemaker placement for bradycardia. - Pathology: Pancreatic head FNA and biliary stricture brushing: Malignant cells consistent with adenocarcinoma. - CA 19-9: 131 - MRI of the abdomen with and without contrast (12/24/2021): Pancreatic head mass measures 2.7 x 2.3 cm (2.5 x 1.8 cm previously).  Mass is abutting and slightly displacing the SMA.  No obvious fat plane is demonstrated.  Portal and splenic veins are patent.  Stable small scattered hepatic cysts with no liver metastatic disease. - PET scan (11/20/2021): Ill-defined low-level hypermetabolic activity about the metallic CBD stent and pancreatic uncinate process without discrete CT correlate.  No convincing evidence of hypermetabolic disease in the neck, chest, abdomen or pelvis.  Tiny bilateral lung nodules  measuring up to 3 mm.  -CT pancreatic protocol (01/16/2022): Hypoenhancing mass in the pancreatic head measuring 2.9 x 1.8 cm.  Slightly greater than 180 degrees of abutment with portal vein as well as SMA without definite vascular encasement.  New enhancing 9 mm soft tissue nodule along the posterior aspect of the gallbladder wall?  Suspicious for peritoneal implant. - Evaluated by Dr. Freida Busman, hepatobiliary surgeon and felt to be borderline candidate prior to neoadjuvant therapy - 4 cycles of gemcitabine and Abraxane from 02/05/2022 through 06/18/2022  - Guardant360 did not show MSI high.  No other targetable mutations.  Limited tissue NGS testing did not show any targetable mutations.  MS-stable.  TMB-low. - 6 cycles of FOLFOX from 08/31/2022 through 11/16/2022.  She could not tolerate FOLFIRINOX. - XRT with Xeloda started around 01/04/2023 - Germline mutation testing: Negative.   2. Social/family history: - She lives at home with her husband who has dementia.  She worked as a Conservation officer, nature for 33 years.  Non-smoker and no exposure to chemicals.  She has not been driving since 06/11/22. - Daughter died of glioblastoma.  Mother had ovarian cancer.  Maternal aunt had breast cancer.    Plan: T2 N0 M0 pancreatic adenocarcinoma: - She has missed XRT last Tuesday and Wednesday as she was not feeling well.  She had 1 episode of vomiting on Tuesday. - She reported pain in her right knee which has spread to the left knee and right hip on Saturday.  The hip pain resolved.  Left knee pain and swelling also improved.  However continues to have right knee swelling and some pain. - We checked a uric acid today which  was normal at 5.2. - She is using a rollator to walk. - She had a UTI based on UA from 01/21/2023.  Initially treated with Cipro.  However cultures came back E. coli which was not sensitive to Cipro.  We have called in Macrobid for 7 days which she has completed.  She still continues to have urinary  symptoms.  We have checked another UA today.  It was consistent with UTI.  We will call in another course of Macrobid for 7 more days. - She does not have any mucositis or hand-foot skin reaction. - Physical exam: Right knee is swollen but not red.  There are some tender points in above the patella.  Does not appear to be septic arthritis. - For her joint pain and swellings, we will give Medrol Dosepak.  Also recommended to take NSAIDs.  If there is no improvement in the next couple of days, she was told to go to the emergency room. - She will finish her radiation this week.  She will continue Xeloda 2 tablets twice daily throughout the course of radiation. - Recommend follow-up in 5 weeks with CTAP pancreatic protocol and CA 19-9 levels.  2.  Weight loss: - Continue Megace 400 mg twice daily.  She is also drinking Ensure/boost 1 to 2 cans/day. - Weight is stable.   3.  Normocytic anemia: - Last Venofer on 07/31/2022.  Hemoglobin today is 9.8.    No orders of the defined types were placed in this encounter.     Alben Deeds Teague,acting as a Neurosurgeon for [name], MD.,have documented all relevant documentation on the behalf of [name], MD,as directed by  [name], MD while in the presence of [name], MD.  ***   Macon R Teague   10/29/20248:25 AM  CHIEF COMPLAINT:   Diagnosis: pancreatic adenocarcinoma    Cancer Staging  Pancreatic adenocarcinoma [facility]) Staging form: Exocrine Pancreas, AJCC 8th Edition - Clinical stage from 11/11/2021: Stage IB (cT2, cN0, cM0) - Unsigned    Prior Therapy: Gemcitabine and Abraxane   Current Therapy:  FOLFOX    HISTORY OF PRESENT ILLNESS:   Oncology History  Pancreatic adenocarcinoma (HCC)  11/11/2021 Initial Diagnosis   Pancreatic carcinoma (HCC)   02/05/2022 - 07/02/2022 Chemotherapy   Patient is on Treatment Plan : PANCREATIC Abraxane D1,15 + Gemcitabine D1,15 q28d     08/31/2022 -  Chemotherapy    Patient is on Treatment Plan : PANCREAS Modified FOLFIRINOX q14d x 4 cycles     12/22/2022 Genetic Testing   No pathogenic variants identified on the [facility] Multi-Cancer+RNA panel. VUS in BMPR1A called Gain (Promoter, Non-coding exon 1, Non-coding exon 2, Exons 3-13) identified. The report date is 12/22/2022.  The Multi-Cancer + RNA Panel offered by [facility] includes sequencing and/or deletion/duplication analysis of the following 70 genes:  AIP*, ALK, APC*, ATM*, AXIN2*, BAP1*, BARD1*, BLM*, BMPR1A*, BRCA1*, BRCA2*, BRIP1*, CDC73*, CDH1*, CDK4, CDKN1B*, CDKN2A, CHEK2*, CTNNA1*, DICER1*, EPCAM, EGFR, FH*, FLCN*, GREM1, HOXB13, KIT, LZTR1, MAX*, MBD4, MEN1*, MET, MITF, MLH1*, MSH2*, MSH3*, MSH6*, MUTYH*, NF1*, NF2*, NTHL1*, PALB2*, PDGFRA, PMS2*, POLD1*, POLE*, POT1*, PRKAR1A*, PTCH1*, PTEN*, RAD51C*, RAD51D*, RB1*, RET, SDHA*, SDHAF2*, SDHB*, SDHC*, SDHD*, SMAD4*, SMARCA4*, SMARCB1*, SMARCE1*, STK11*, SUFU*, TMEM127*, TP53*, TSC1*, TSC2*, VHL*. RNA analysis is performed for * genes.      INTERVAL HISTORY:   Raney is a 88 y.o. female presenting to clinic today for follow up of pancreatic adenocarcinoma. She was last seen by me on 02/08/23.  Since her last visit,  she was admitted to the hospital on 03/16/23 for biliary obstruction. 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 She was given 5 days of antibiotics for cholangitis. There was a suspected liver metastasis found at her hospital stay and met with palliative care, but would like to discuss scope of care, biopsy, and possible interventions with me.   Today, she states that she is doing well overall. Her appetite level is at ***%. Her energy level is at ***%.  PAST MEDICAL HISTORY:   Past Medical History: Past Medical History:  Diagnosis Date   Essential tremor    GERD (gastroesophageal reflux disease)    High cholesterol    Hypertension    PONV (postoperative nausea and  vomiting)    Port-A-Cath in place 01/27/2022   Vertigo     Surgical History: Past Surgical History:  Procedure Laterality Date   BILIARY BRUSHING  09/10/2021   Procedure: BILIARY BRUSHING;  Surgeon: Hilarie Fredrickson, MD;  Location: [facility] ENDOSCOPY;  Service: Gastroenterology;;   BILIARY BRUSHING  10/29/2021   Procedure: BILIARY BRUSHING;  Surgeon: Lemar Lofty., MD;  Location: [facility] ENDOSCOPY;  Service: Gastroenterology;;   BILIARY DILATION  10/29/2021   Procedure: BILIARY DILATION;  Surgeon: Lemar Lofty., MD;  Location: [facility] ENDOSCOPY;  Service: Gastroenterology;;   BILIARY STENT PLACEMENT  09/10/2021   Procedure: BILIARY STENT PLACEMENT;  Surgeon: Hilarie Fredrickson, MD;  Location: [facility] ENDOSCOPY;  Service: Gastroenterology;;   BILIARY STENT PLACEMENT N/A 10/03/2021   Procedure: BILIARY STENT PLACEMENT;  Surgeon: Iva Boop, MD;  Location: [facility] ENDOSCOPY;  Service: Gastroenterology;  Laterality: N/A;   BILIARY STENT PLACEMENT N/A 10/29/2021   Procedure: BILIARY STENT PLACEMENT;  Surgeon: Meridee Score Netty Starring., MD;  Location: [facility] ENDOSCOPY;  Service: Gastroenterology;  Laterality: N/A;   BILIARY STENT PLACEMENT N/A 05/08/2022   Procedure: BILIARY STENT PLACEMENT;  Surgeon: Iva Boop, MD;  Location: [facility] ENDOSCOPY;  Service: Gastroenterology;  Laterality: N/A;   BIOPSY  10/29/2021   Procedure: BIOPSY;  Surgeon: Meridee Score Netty Starring., MD;  Location: [facility] ENDOSCOPY;  Service: Gastroenterology;;   ENDOSCOPIC RETROGRADE CHOLANGIOPANCREATOGRAPHY (ERCP) WITH PROPOFOL N/A 10/29/2021   Procedure: ENDOSCOPIC RETROGRADE CHOLANGIOPANCREATOGRAPHY (ERCP) WITH PROPOFOL;  Surgeon: Lemar Lofty., MD;  Location: [facility] ENDOSCOPY;  Service: Gastroenterology;  Laterality: N/A;   ENDOSCOPIC RETROGRADE CHOLANGIOPANCREATOGRAPHY (ERCP) WITH PROPOFOL N/A 03/19/2023   Procedure: ENDOSCOPIC RETROGRADE CHOLANGIOPANCREATOGRAPHY (ERCP) WITH PROPOFOL;  Surgeon: Meridee Score Netty Starring., MD;  Location: [facility]  ENDOSCOPY;  Service: Gastroenterology;  Laterality: N/A;   ERCP N/A 09/10/2021   Procedure: ENDOSCOPIC RETROGRADE CHOLANGIOPANCREATOGRAPHY (ERCP);  Surgeon: Hilarie Fredrickson, MD;  Location: [facility] ENDOSCOPY;  Service: Gastroenterology;  Laterality: N/A;   ERCP N/A 10/03/2021   Procedure: ENDOSCOPIC RETROGRADE CHOLANGIOPANCREATOGRAPHY (ERCP);  Surgeon: Iva Boop, MD;  Location: [facility] ENDOSCOPY;  Service: Gastroenterology;  Laterality: N/A;   ERCP N/A 05/08/2022   Procedure: ENDOSCOPIC RETROGRADE CHOLANGIOPANCREATOGRAPHY (ERCP);  Surgeon: Iva Boop, MD;  Location: [facility] ENDOSCOPY;  Service: Gastroenterology;  Laterality: N/A;   ESOPHAGOGASTRODUODENOSCOPY (EGD) WITH PROPOFOL N/A 10/29/2021   Procedure: ESOPHAGOGASTRODUODENOSCOPY (EGD) WITH PROPOFOL;  Surgeon: Meridee Score Netty Starring., MD;  Location: [facility] ENDOSCOPY;  Service: Gastroenterology;  Laterality: N/A;   EUS N/A 10/29/2021   Procedure: UPPER ENDOSCOPIC ULTRASOUND (EUS) RADIAL;  Surgeon: Lemar Lofty., MD;  Location: [facility] ENDOSCOPY;  Service: Gastroenterology;  Laterality: N/A;   FINE NEEDLE ASPIRATION  10/29/2021   Procedure: FINE NEEDLE ASPIRATION (FNA) LINEAR;  Surgeon: Lemar Lofty.,  MD;  Location: [facility] ENDOSCOPY;  Service: Gastroenterology;;   IR IMAGING GUIDED PORT INSERTION  01/28/2022   PACEMAKER IMPLANT     REMOVAL OF STONES  10/29/2021   Procedure: REMOVAL OF SLUDGE;  Surgeon: Meridee Score Netty Starring., MD;  Location: [facility] ENDOSCOPY;  Service: Gastroenterology;;   REMOVAL OF STONES  03/19/2023   Procedure: REMOVAL OF SLUDGE;  Surgeon: Lemar Lofty., MD;  Location[facility] ENDOSCOPY;  Service: Gastroenterology;;   Dennison Mascot  09/10/2021   Procedure: Dennison Mascot;  Surgeon: Hilarie Fredrickson, MD;  Location: [facility] ENDOSCOPY;  Service: Gastroenterology;;   [place]man [place] CHOLANGIOSCOPY N/A 10/29/2021   Procedure: [id number] CHOLANGIOSCOPY;  Surgeon: Lemar Lofty., MD;  Location: [facility] ENDOSCOPY;  Service: Gastroenterology;   Laterality: N/A;   STENT REMOVAL  10/03/2021   Procedure: STENT REMOVAL;  Surgeon: Iva Boop, MD;  Location: [facility] ENDOSCOPY;  Service: Gastroenterology;;    Social History: Social History   Socioeconomic History   Marital status: Widowed    Spouse name: Not on file   Number of children: Not on file   Years of education: Not on file   Highest education level: Not on file  Occupational History   Not on file  Tobacco Use   Smoking status: Never   Smokeless tobacco: Never  Vaping Use   Vaping status: Never Used  Substance and Sexual Activity   Alcohol use: Never   Drug use: Never   Sexual activity: Not on file  Other Topics Concern   Not on file  Social History Narrative   Not on file   Social Determinants of Health   Financial Resource Strain: Not on file  Food Insecurity: No Food Insecurity (03/16/2023)   Hunger Vital Sign    Worried About Running Out of Food in the Last Year: Never true    Ran Out of Food in the Last Year: Never true  Transportation Needs: No Transportation Needs (03/16/2023)   PRAPARE - Administrator, Civil Service (Medical): No    Lack of Transportation (Non-Medical): No  Physical Activity: Not on file  Stress: Not on file  Social Connections: Not on file  Intimate Partner Violence: Not At Risk (03/16/2023)   Humiliation, Afraid, Rape, and Kick questionnaire    Fear of Current or Ex-Partner: No    Emotionally Abused: No    Physically Abused: No    Sexually Abused: No    Family History: Family History  Problem Relation Age of Onset   Cancer Mother        unknown, possibly female cancer   Breast cancer Maternal Aunt        dx 52s   Throat cancer Paternal Uncle    Brain cancer Daughter        glioblastoma d. 13   Stomach cancer Neg Hx    Esophageal cancer Neg Hx    Colon cancer Neg Hx    Pancreatic cancer Neg Hx     Current Medications:  Current Outpatient Medications:    acetaminophen (TYLENOL) 500 MG tablet, Take  500 mg by mouth as needed for mild pain (pain score 1-3) or moderate pain (pain score 4-6)., Disp: , Rfl:    amLODipine (NORVASC) 5 MG tablet, Take 5 mg by mouth at bedtime., Disp: , Rfl:    aspirin EC 81 MG tablet, Take 81 mg by mouth in the morning. Swallow whole., Disp: , Rfl:    cetirizine (ZYRTEC) 10 MG tablet, Take 10 mg by mouth as needed for allergies., Disp: ,  Rfl:    Cholecalciferol (VITAMIN D3) 50 MCG (2000 UT) TABS, Take 2,000 Units by mouth at bedtime., Disp: , Rfl:    Coenzyme Q10 (CO Q-10) 100 MG CAPS, Take 100 mg by mouth at bedtime., Disp: , Rfl:    diphenhydrAMINE (BENADRYL) 50 MG tablet, Take 1 tablet (50 mg total) by mouth once for 1 dose. Take 1 hour prior to scan, Disp: 1 tablet, Rfl: 0   famotidine (PEPCID) 40 MG tablet, Take 40 mg by mouth every evening., Disp: , Rfl:    gabapentin (NEURONTIN) 100 MG capsule, Take 100 mg by mouth 2 (two) times daily., Disp: , Rfl:    lidocaine-prilocaine (EMLA) cream, Apply 1 Application topically as needed (port)., Disp: , Rfl:    meclizine (ANTIVERT) 25 MG tablet, Take 25 mg by mouth daily as needed for dizziness., Disp: , Rfl:    megestrol (MEGACE) 400 MG/10ML suspension, Take 10 mLs (400 mg total) by mouth 2 (two) times daily., Disp: 480 mL, Rfl: 3   Misc Natural Products (JOINT SUPPORT) CAPS, Take 1 capsule by mouth daily with breakfast., Disp: , Rfl:    Multiple Vitamin (MULTIVITAMIN) tablet, Take 1 tablet by mouth daily with breakfast., Disp: , Rfl:    Multiple Vitamins-Minerals (PRESERVISION AREDS PO), Take 1 tablet by mouth in the morning and at bedtime., Disp: , Rfl:    nebivolol (BYSTOLIC) 5 MG tablet, Take 2.5 mg by mouth in the morning., Disp: , Rfl:    omeprazole (PRILOSEC) 40 MG capsule, Take 40 mg by mouth daily before breakfast., Disp: , Rfl:    polyethylene glycol (MIRALAX) 17 g packet, Take 17 g by mouth daily as needed. (Patient taking differently: Take 17 g by mouth daily as needed for moderate constipation.), Disp: 14  each, Rfl: 0   Polyvinyl Alcohol-Povidone (REFRESH OP), Place 1 drop into both eyes daily as needed (tired eyes)., Disp: , Rfl:    primidone (MYSOLINE) 50 MG tablet, Take 50 mg by mouth daily as needed (for tremors)., Disp: , Rfl:    pyridOXINE (VITAMIN B-6) 100 MG tablet, Take 100 mg by mouth in the morning., Disp: , Rfl:    tiZANidine (ZANAFLEX) 2 MG tablet, Take 1 mg by mouth in the morning., Disp: , Rfl:    Allergies: Allergies  Allergen Reactions   Ivp Dye [Iodinated Contrast Media] Hives   Lisinopril Cough    REVIEW OF SYSTEMS:   Review of Systems  Constitutional:  Negative for chills, fatigue and fever.  HENT:   Negative for lump/mass, mouth sores, nosebleeds, sore throat and trouble swallowing.   Eyes:  Negative for eye problems.  Respiratory:  Negative for cough and shortness of breath.   Cardiovascular:  Negative for chest pain, leg swelling and palpitations.  Gastrointestinal:  Negative for abdominal pain, constipation, diarrhea, nausea and vomiting.  Genitourinary:  Negative for bladder incontinence, difficulty urinating, dysuria, frequency, hematuria and nocturia.   Musculoskeletal:  Negative for arthralgias, back pain, flank pain, myalgias and neck pain.  Skin:  Negative for itching and rash.  Neurological:  Negative for dizziness, headaches and numbness.  Hematological:  Does not bruise/bleed easily.  Psychiatric/Behavioral:  Negative for depression, sleep disturbance and suicidal ideas. The patient is not nervous/anxious.   All other systems reviewed and are negative.    VITALS:   There were no vitals taken for this visit.  Wt Readings from Last 3 Encounters:  03/16/23 128 lb 1.4 oz (58.1 kg)  02/08/23 128 lb (58.1 kg)  01/21/23 125 lb  3.2 oz (56.8 kg)    There is no height or weight on file to calculate BMI.  Performance status (ECOG): 2 - Symptomatic, <50% confined to bed  PHYSICAL EXAM:   Physical Exam Vitals and nursing note reviewed. Exam conducted  with a chaperone present.  Constitutional:      Appearance: Normal appearance.  Cardiovascular:     Rate and Rhythm: Normal rate and regular rhythm.     Pulses: Normal pulses.     Heart sounds: Normal heart sounds.  Pulmonary:     Effort: Pulmonary effort is normal.     Breath sounds: Normal breath sounds.  Abdominal:     Palpations: Abdomen is soft. There is no hepatomegaly, splenomegaly or mass.     Tenderness: There is no abdominal tenderness.  Musculoskeletal:     Right lower leg: No edema.     Left lower leg: No edema.  Lymphadenopathy:     Cervical: No cervical adenopathy.     Right cervical: No superficial, deep or posterior cervical adenopathy.    Left cervical: No superficial, deep or posterior cervical adenopathy.     Upper Body:     Right upper body: No supraclavicular or axillary adenopathy.     Left upper body: No supraclavicular or axillary adenopathy.  Neurological:     General: No focal deficit present.     Mental Status: She is alert and oriented to person, place, and time.  Psychiatric:        Mood and Affect: Mood normal.        Behavior: Behavior normal.     LABS:      Latest Ref Rng & Units 03/24/2023    3:36 AM 03/23/2023    3:07 AM 03/22/2023    4:04 AM  CBC  WBC 4.0 - 10.5 K/uL 15.0  17.3  16.8   Hemoglobin 12.0 - 15.0 g/dL 7.8  8.3  7.9   Hematocrit 36.0 - 46.0 % 25.3  26.4  25.5   Platelets 150 - 400 K/uL 438  436  422       Latest Ref Rng & Units 03/24/2023    3:36 AM 03/23/2023    3:07 AM 03/22/2023    4:04 AM  CMP  Glucose 70 - 99 mg/dL 161  096  93   BUN 8 - 23 mg/dL 23  24  30    Creatinine 0.44 - 1.00 mg/dL 0.45  4.09  8.11   Sodium 135 - 145 mmol/L 132  134  130   Potassium 3.5 - 5.1 mmol/L 4.0  4.1  4.3   Chloride 98 - 111 mmol/L 104  103  104   CO2 22 - 32 mmol/L 21  19  19    Calcium 8.9 - 10.3 mg/dL 9.1  9.4  8.3   Total Protein 6.5 - 8.1 g/dL 5.6  5.8    Total Bilirubin 0.3 - 1.2 mg/dL 1.7  1.6    Alkaline Phos 38 - 126  U/L 515  520    AST 15 - 41 U/L 31  30    ALT 0 - 44 U/L 47  47       No results found for: "CEA1", "CEA" / No results found for: "CEA1", "CEA" No results found for: "PSA1" Lab Results  Component Value Date   BJY782 5,840 (H) 03/12/2023   No results found for: "NFA213"  No results found for: "TOTALPROTELP", "ALBUMINELP", "A1GS", "A2GS", "BETS", "BETA2SER", "GAMS", "MSPIKE", "SPEI" Lab Results  Component Value Date  TIBC 203 (L) 03/16/2023   TIBC 438 06/18/2022   FERRITIN 702 (H) 03/16/2023   FERRITIN 32 06/18/2022   IRONPCTSAT 18 03/16/2023   IRONPCTSAT 13 06/18/2022   No results found for: "LDH"   STUDIES:   DG ERCP  Result Date: 03/23/2023 CLINICAL DATA:  ERCP for biliary duct obstruction. EXAM: ERCP TECHNIQUE: Multiple spot images obtained with the fluoroscopic device and submitted for interpretation post-procedure. FLUOROSCOPY TIME: FLUOROSCOPY TIME 3 minutes, 27 seconds (38.2 mGy) COMPARISON:  ERCP-05/08/2022; MRCP-03/18/2023 FINDINGS: Four spot intraoperative fluoroscopic images of the right upper abdominal quadrant during ERCP are provided for review Initial image demonstrates an ERCP probe overlying the right upper abdominal quadrant. Pre-existing metallic biliary stent overlies expected location of the CBD. Initial image demonstrates selective cannulation and opacification of the CBD. Near occlusive filling defects are seen throughout the CBD. Subsequent images demonstrate insufflation of a balloon within the central aspect of the CBD with subsequent biliary sweeping and presumed stone and sludge extraction. There is minimal opacification of the intrahepatic biliary tree which appears nondilated. IMPRESSION: ERCP with biliary stent sweeping as above. These images were submitted for radiologic interpretation only. Please see the procedural report for the amount of contrast and the fluoroscopy time utilized. Electronically Signed   By: Simonne Come M.D.   On: 03/23/23 07:23    MR ABDOMEN MRCP W WO CONTAST  Result Date: 03/18/2023 CLINICAL DATA:  History of pancreatic cancer with biliary obstruction. Previous biliary stenting, most recently 10 months ago. Chemotherapy and radiation therapy completed. Rising CA 19-9 levels and liver function studies suspicious for recurrent biliary obstruction. EXAM: MRI ABDOMEN WITHOUT AND WITH CONTRAST (INCLUDING MRCP) TECHNIQUE: Multiplanar multisequence MR imaging of the abdomen was performed both before and after the administration of intravenous contrast. Heavily T2-weighted images of the biliary and pancreatic ducts were obtained, and three-dimensional MRCP images were rendered by post processing. CONTRAST:  5mL GADAVIST GADOBUTROL 1 MMOL/ML IV SOLN COMPARISON:  Abdominopelvic CT 12/07/2022 and 07/09/2022. Abdominal MRI 12/24/2021. FINDINGS: Despite efforts by the technologist and patient, moderate motion artifact is present on today's exam and could not be eliminated. This reduces exam sensitivity and specificity. The motion progresses throughout the examination. Lower chest: Trace right pleural effusion with mild bibasilar atelectasis. Hepatobiliary: Interval development of a heterogeneous mass or adjacent masses superiorly in the left hepatic lobe, measuring up to 4.8 x 3.8 cm on image 11/3. These masses demonstrate heterogeneous T2 hyperintensity, low T1 signal and restricted diffusion. Following contrast, there is some peripheral enhancement of these lesions, especially the inferior components. No suspicious lesions identified in the right lobe. Mild intrahepatic biliary dilatation has developed in the left lobe. No significant biliary dilatation identified in the right lobe. A metallic biliary stent remains in place; pneumobilia and stent patency difficult to confirm by MRI. The gallbladder appears unremarkable, without stones, distension or wall thickening. Pancreas: There is a multi-septated cystic lesion in the pancreatic head,  adjacent to the biliary stent which measures up to 1.8 x 1.1 cm on image 31/3. This demonstrates no definite enhancement following contrast and may relate to the treated pancreatic malignancy. There is mild pancreatic ductal dilatation. Spleen: Normal in size without focal abnormality. Adrenals/Urinary Tract: Both adrenal glands appear normal. New moderate hydronephrosis and hydroureter with abrupt change in ureteral caliber at the level of the L4-5 disc space (coronal image 20/2). No obvious reader all calculus or surrounding soft tissue mass. No significant collecting system dilatation on the left. There is a small cyst in the  upper pole of the left kidney for which no specific follow-up imaging is recommended. Bladder not imaged. Stomach/Bowel: The stomach appears unremarkable for its degree of distension. No evidence of bowel wall thickening, distention or surrounding inflammatory change. Vascular/Lymphatic: There are no enlarged abdominal lymph nodes. The portal, superior mesenteric and splenic veins appear patent. There are small central mesenteric venous collaterals. No acute vascular findings are identified. Aortic and branch vessel atherosclerosis, better seen on CT. Other: A small amount of ascites has developed since the recent CT. There is nonspecific edema throughout the subcutaneous and intra-abdominal fat without focal fluid collection. No peritoneal masses are identified. Musculoskeletal: No acute or significant osseous findings. Mild multilevel spondylosis. IMPRESSION: 1. Interval development of a heterogeneous mass or adjacent masses superiorly in the left hepatic lobe, suspicious for metastatic disease. There is associated new mild intrahepatic biliary dilatation in the left lobe. 2. Pneumobilia and biliary stent patency difficult to address by MRI. 3. New moderate hydronephrosis and hydroureter with abrupt change in ureteral caliber at the level of the L4-5 disc space. No obvious obstructing  calculus or surrounding soft tissue mass identified. 4. New small amount of ascites and nonspecific subcutaneous and intra-abdominal fat edema. 5. Multi-septated cystic lesion in the pancreatic head may relate to the treated pancreatic malignancy. 6. Current study is motion degraded. In this patient with significant renal insufficiency and presumed desire to avoid iodinated contrast, alternative imaging is limited. However, noncontrast abdominopelvic CT may be helpful to assess biliary stent patency and the cause of the apparent mid right ureteral obstruction. Electronically Signed   By: Carey Bullocks M.D.   On: 03/18/2023 14:15   DG Chest Port 1 View  Result Date: 03/16/2023 CLINICAL DATA:  Fatigue. EXAM: PORTABLE CHEST 1 VIEW COMPARISON:  Chest radiograph dated 05/07/2022 and CT dated 12/07/2022. FINDINGS: Right-sided Port-A-Cath with tip close to the cavoatrial junction. There is diffuse chronic interstitial coarsening. No focal consolidation, pleural effusion, or pneumothorax. The cardiac silhouette is within normal limits. Atherosclerotic calcification of the aorta. Osteopenia with degenerative changes of spine. No acute osseous pathology. IMPRESSION: 1. No active disease. 2. Chronic interstitial coarsening. Electronically Signed   By: Elgie Collard M.D.   On: 03/16/2023 22:35

## 2023-04-05 ENCOUNTER — Telehealth: Payer: Self-pay

## 2023-04-05 NOTE — Telephone Encounter (Signed)
Spoke with patient's daughter informing her that her [employer] FMLA documents had been completed and faxed to the company. Fax confirmation received. No further concerns at this time.

## 2023-05-02 DEATH — deceased

## 2024-01-27 IMAGING — MR MR ABDOMEN WO/W CM MRCP
21 of 24 series · 45 of 48 positions shown · IV contrast (Contrast agent)
Comparison: None.

CLINICAL DATA: Abnormal urine and stools.



[Series 4: ax haste · axial · 6.0mm · 1.16mm/px · 1 of 36 slices shown]
[im 1/36]
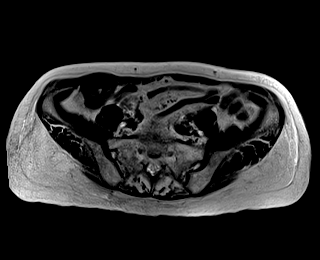

[Series 5: cor haste · coronal · 6.0mm · 1.16mm/px · 1 of 30 slices shown]
[im 1/30]
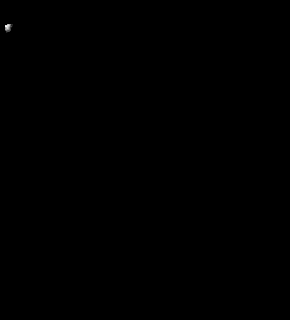

[Series 13: DWI · axial · 6.0mm · 1.38mm/px · z∈[-129,+123]mm · 3 of 108 slices shown (1 of 2)]
[im 1/108]
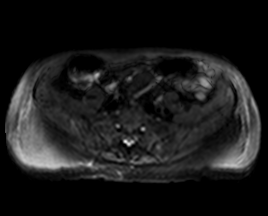
[im 54/108]
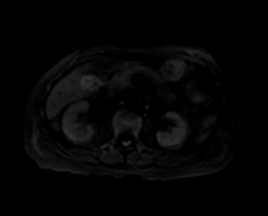
[im 108/108]
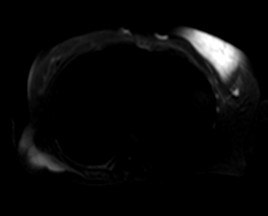

[Series 14: DWI · axial · 6.0mm · 1.38mm/px · 1 of 36 slices shown (2 of 2)]
[im 1/36]
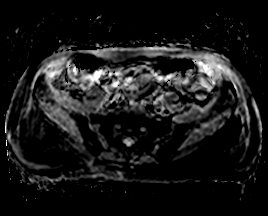

[Series 15: ax in and · axial · 3.0mm · 1.16mm/px · z∈[-121,+116]mm · 2 of 80 slices shown (1 of 2)]
[im 1/80]
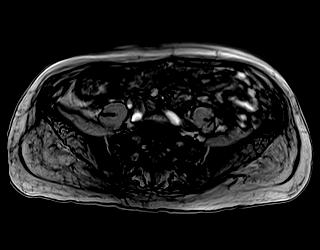
[im 80/80]
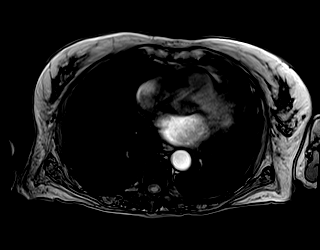

[Series 15: ax in and · axial · 3.0mm · 1.16mm/px · z∈[-121,+116]mm · 2 of 80 slices shown (2 of 2)]
[im 1/80]
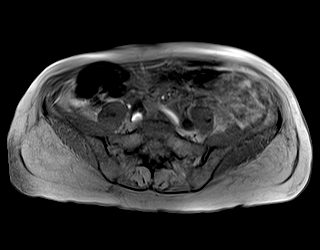
[im 80/80]
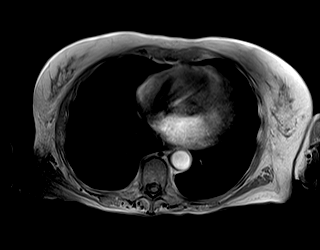

[Series 16: MRCP · coronal · 4.0mm · 1.12mm/px · 1 of 15 slices shown]
[im 1/15]
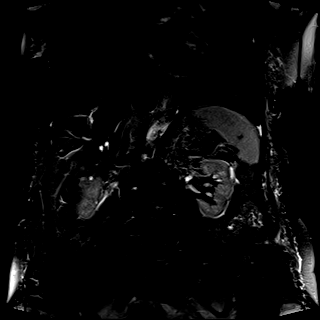

[Series 17: radials · coronal · 50.0mm · 0.78mm/px · 1 of 3 slices shown]
[im 1/3]
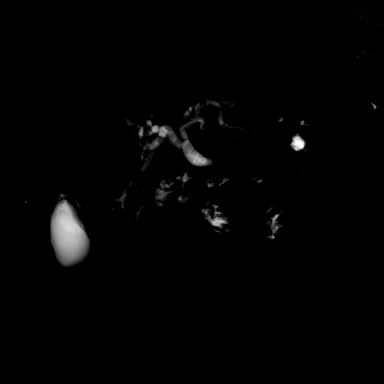

[Series 20: T2 fat-sat · axial · 6.0mm · 1.16mm/px · 1 of 36 slices shown]
[im 1/36]
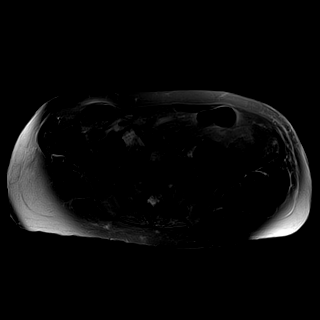

[Series 28: T1 dynamic · axial · non-contrast · 3.0mm · 1.19mm/px · z∈[-93,+144]mm · 2 of 80 slices shown]
[im 1/80]
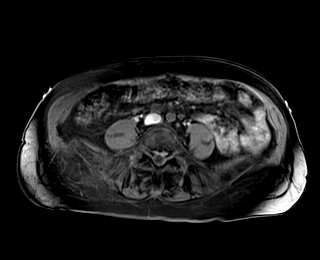
[im 80/80]
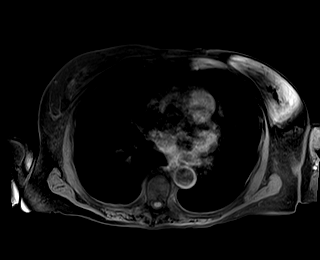

[Series 30: T1 dynamic post-contrast · axial · 3.0mm · 1.19mm/px · z∈[-93,+144]mm · 2 of 80 slices shown (1 of 11)]
[im 1/80]
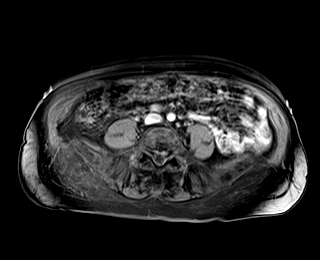
[im 80/80]
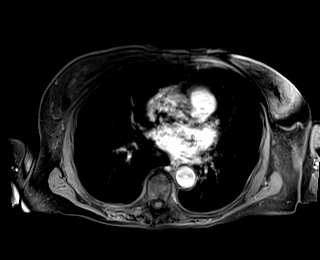

[Series 31: T1 dynamic post-contrast · axial · 3.0mm · 1.19mm/px · z∈[-93,+144]mm · 2 of 80 slices shown (2 of 11)]
[im 1/80]
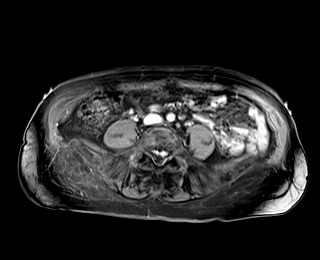
[im 80/80]
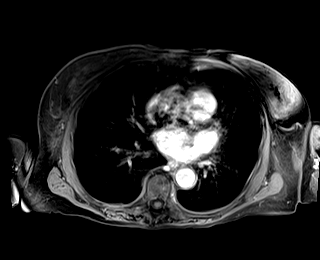

[Series 32: T1 dynamic post-contrast · axial · 3.0mm · 1.19mm/px · z∈[-93,+144]mm · 3 of 80 slices shown (3 of 11)]
[im 1/80]
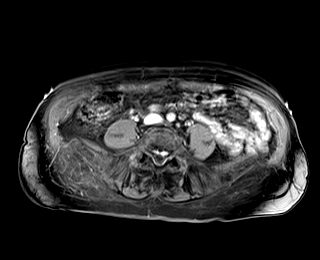
[im 40/80]
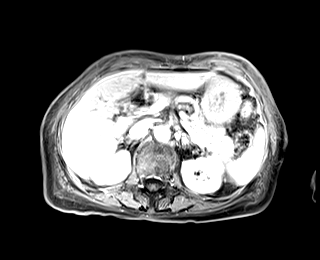
[im 80/80]
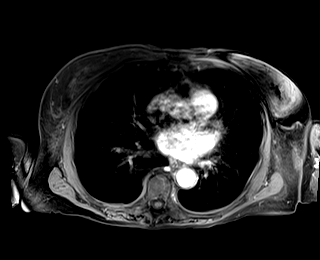

[Series 33: T1 dynamic post-contrast · axial · 3.0mm · 1.19mm/px · z∈[-93,+144]mm · 3 of 80 slices shown (4 of 11)]
[im 1/80]
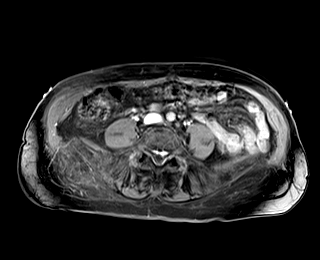
[im 40/80]
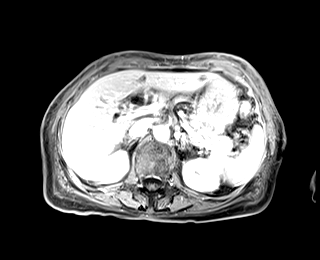
[im 80/80]
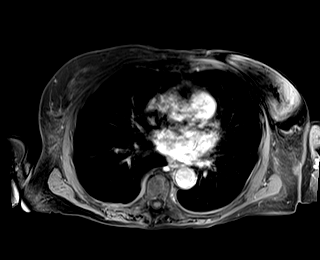

[Series 34: T1 dynamic post-contrast · coronal · 3.0mm · 1.16mm/px · 2 of 72 slices shown (5 of 11)]
[im 1/72]
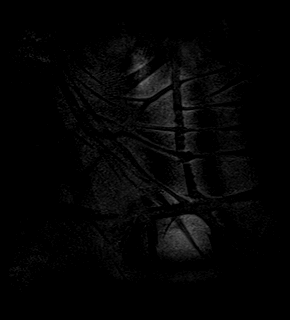
[im 72/72]
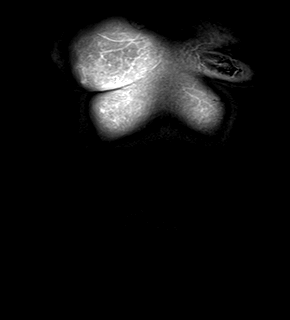

[Series 1016: T1 dynamic post-contrast · axial · 3.0mm · 1.19mm/px · z∈[-93,+144]mm · 3 of 80 slices shown (6 of 11)]
[im 1/80]
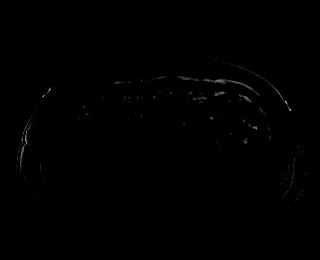
[im 40/80]
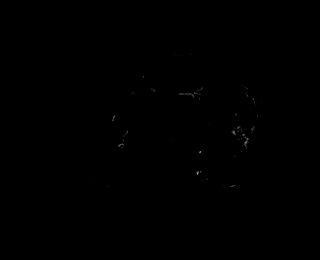
[im 80/80]
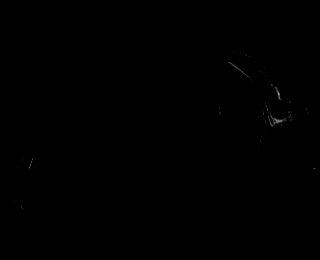

[Series 1020: T1 dynamic post-contrast · axial · 3.0mm · 1.19mm/px · z∈[-93,+144]mm · 3 of 80 slices shown (7 of 11)]
[im 1/80]
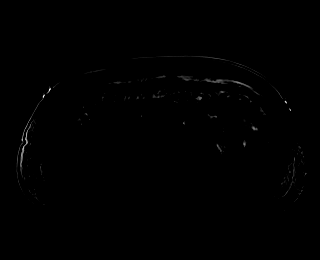
[im 40/80]
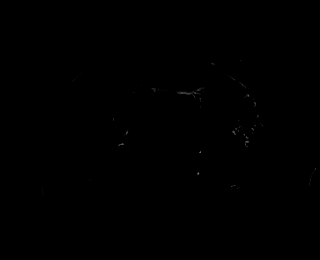
[im 80/80]
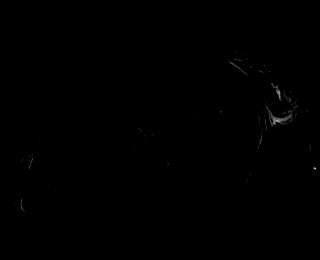

[Series 1028: T1 dynamic post-contrast · axial · 3.0mm · 1.19mm/px · z∈[-93,+144]mm · 3 of 80 slices shown (8 of 11)]
[im 1/80]
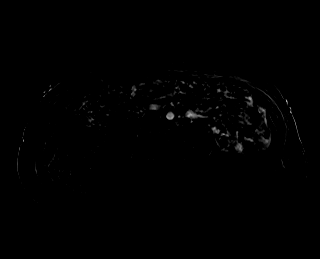
[im 40/80]
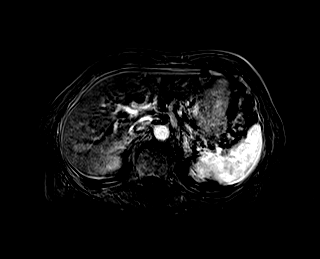
[im 80/80]
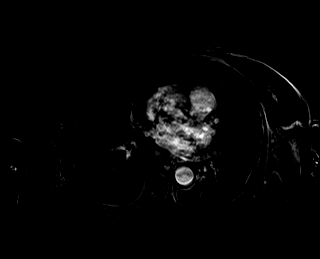

[Series 1030: T1 dynamic post-contrast · axial · 3.0mm · 1.19mm/px · z∈[-93,+144]mm · 3 of 80 slices shown (9 of 11)]
[im 1/80]
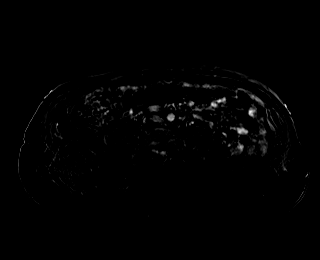
[im 40/80]
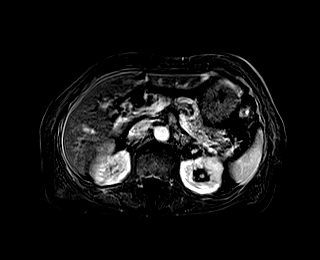
[im 80/80]
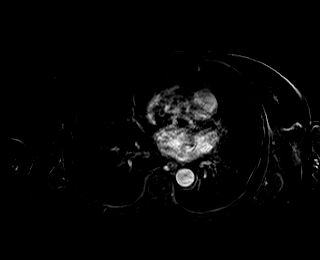

[Series 1032: T1 dynamic post-contrast · axial · 3.0mm · 1.19mm/px · z∈[-93,+144]mm · 3 of 80 slices shown (10 of 11)]
[im 1/80]
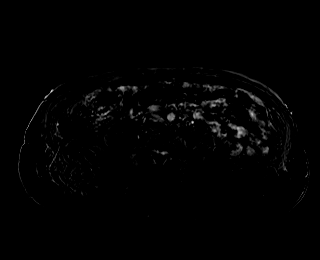
[im 40/80]
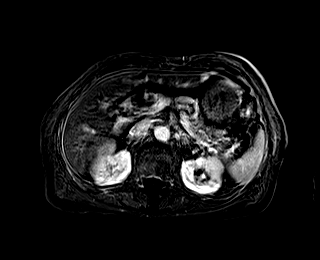
[im 80/80]
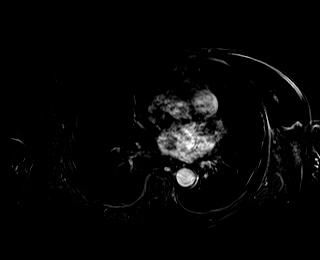

[Series 1034: T1 dynamic post-contrast · axial · 3.0mm · 1.19mm/px · z∈[-93,+144]mm · 3 of 80 slices shown (11 of 11)]
[im 1/80]
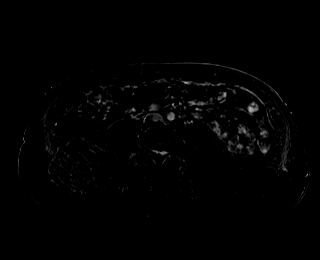
[im 40/80]
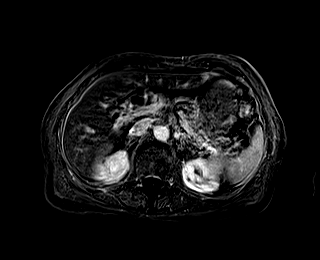
[im 80/80]
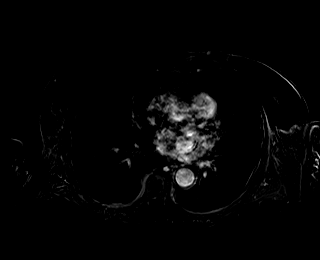

[45 of 48 positions shown; findings below may reference images not displayed]

FINDINGS: Study is significantly limited due to motion.

Lower chest: No acute findings.

Hepatobiliary: Liver is normal in size and contour with no
suspicious mass identified. 1.1 cm cyst in the left hepatic lobe. No
evidence of hepatic steatosis. No cholelithiasis visualized. There
appears to be mild gallbladder wall thickening. Mild intrahepatic
biliary ductal dilatation. Common bile duct is not well visualized.

Pancreas: No obvious pancreatic mass or ductal dilatation
visualized.

Spleen:  Within normal limits in size and appearance.

Adrenals/Urinary Tract: Adrenal glands appear grossly normal.
Symmetric perfusion of the kidneys. Exophytic cyst of the right
kidney measuring 4.2 cm. No hydronephrosis or definite enhancing
renal mass identified bilaterally.

Stomach/Bowel: No evidence of bowel obstruction.

Vascular/Lymphatic: No pathologically enlarged lymph nodes
identified. No abdominal aortic aneurysm demonstrated.

Other:  No ascites.

Musculoskeletal: No suspicious bone lesions identified.
IMPRESSION: 1. Study is significantly limited due to motion.
2. Intrahepatic biliary ductal dilatation and apparent mild
gallbladder wall thickening. Common bile duct is not well
visualized. No cholelithiasis visualized. Correlate clinically
including with bilirubin for evidence of obstruction and consider
evaluation with CT to reduce motion, or HIDA scan.
3. Right renal cyst.  Left hepatic cyst.

## 2024-01-29 IMAGING — RF DG ERCP WO/W SPHINCTEROTOMY
1 series · 13 of 13 positions shown · non-contrast
Comparison: Ultrasound abdomen limited 09/08/2021

CLINICAL DATA: Jaundice

Elevated liver function tests
EXAM:
ERCP
TECHNIQUE: Multiple spot images obtained with the fluoroscopic device and
submitted for interpretation post-procedure.
FLUOROSCOPY:
Radiation Exposure Index (as provided by the fluoroscopic device):
49 mGy Kerma

[Series 1: unknown protocol · 0.20mm/px · 13 of 13 slices shown]
[im 1/13]
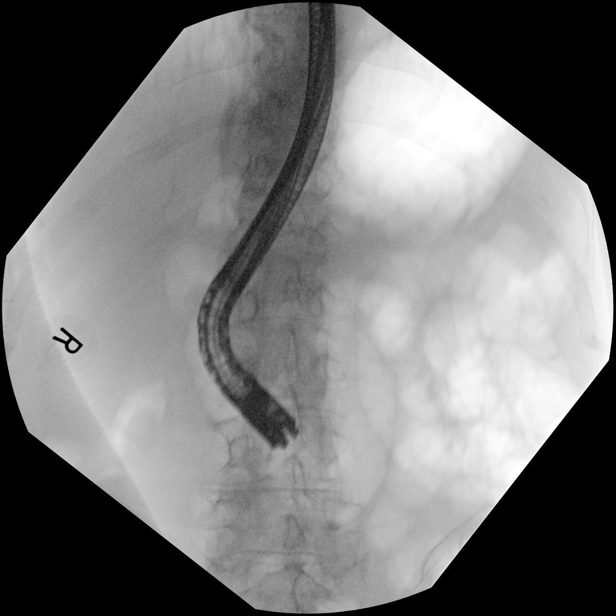
[im 2/13]
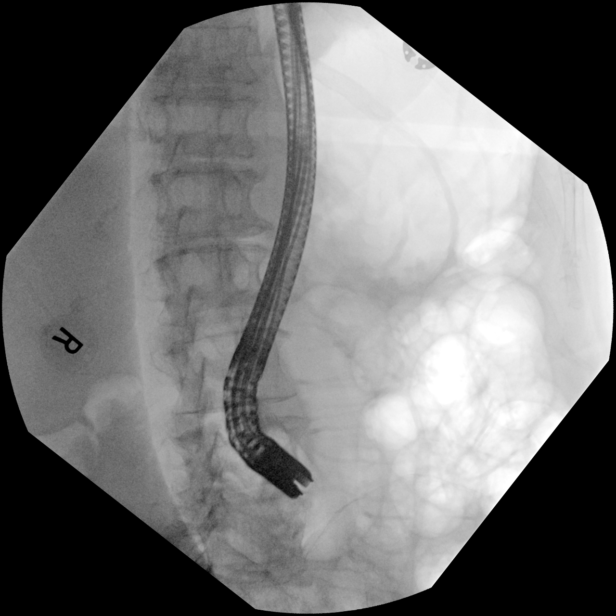
[im 3/13]
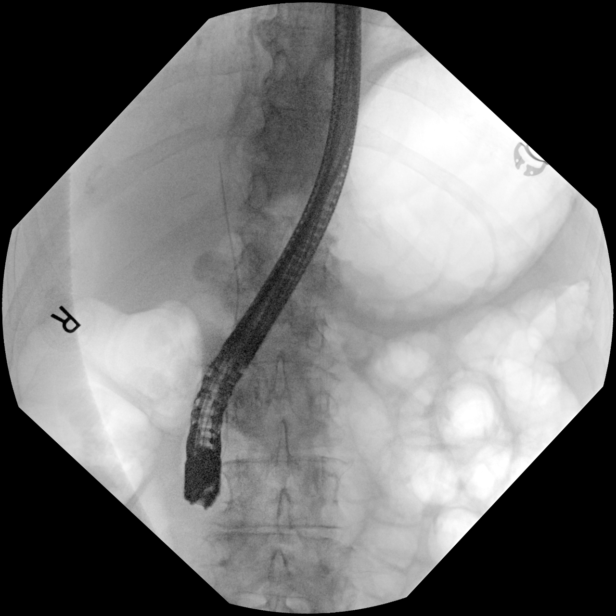
[im 4/13]
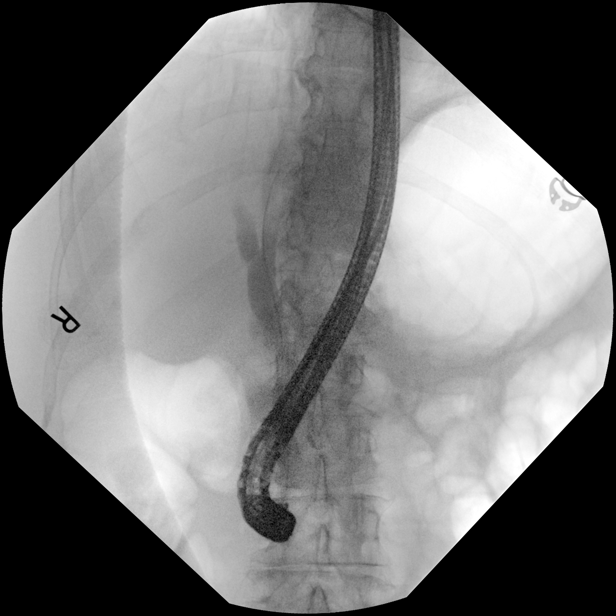
[im 5/13]
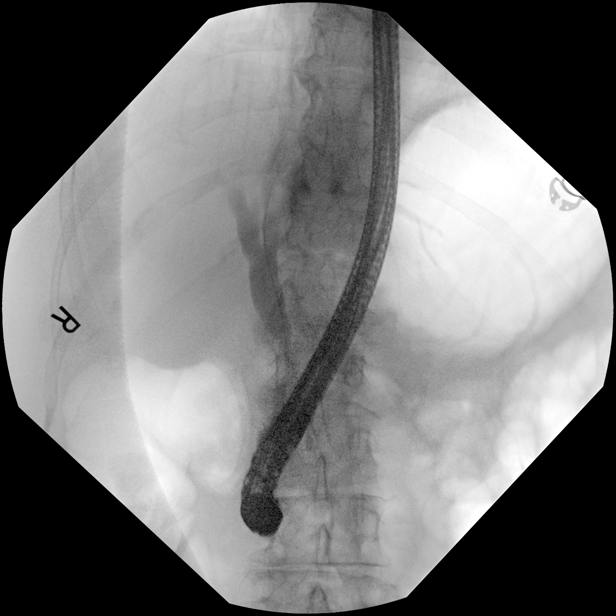
[im 6/13]
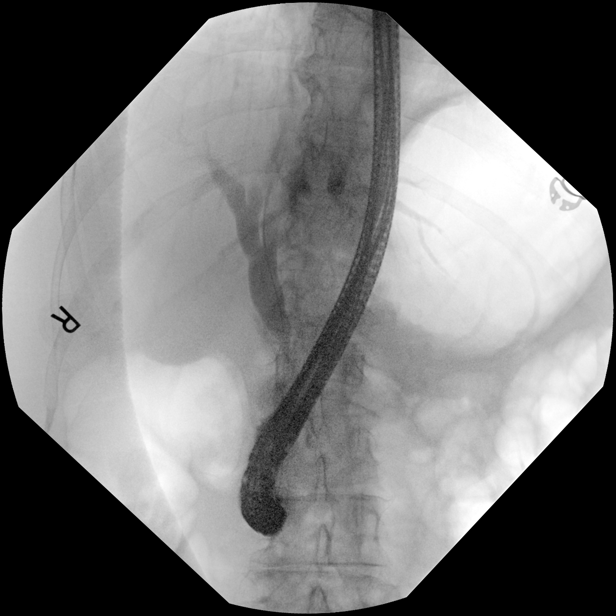
[im 7/13]
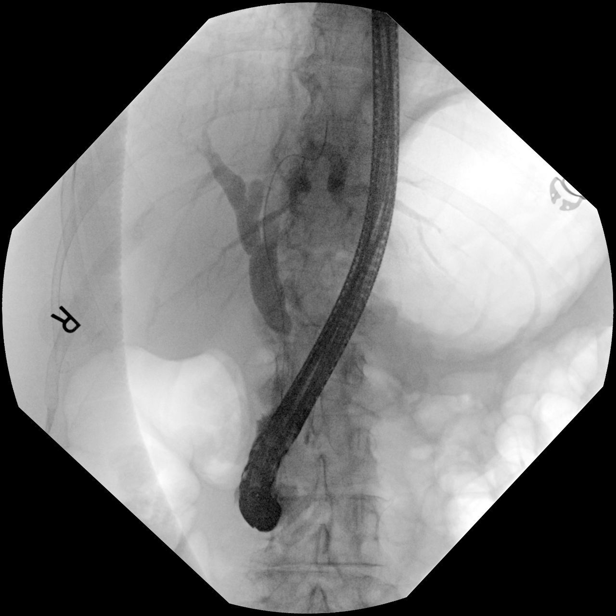
[im 8/13]
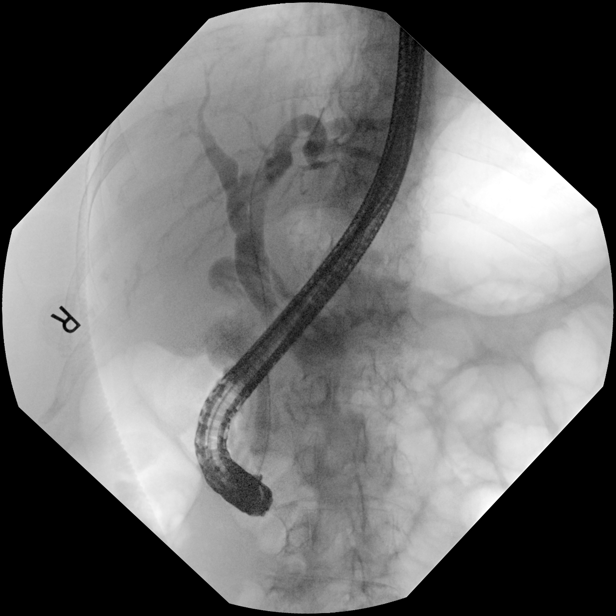
[im 9/13]
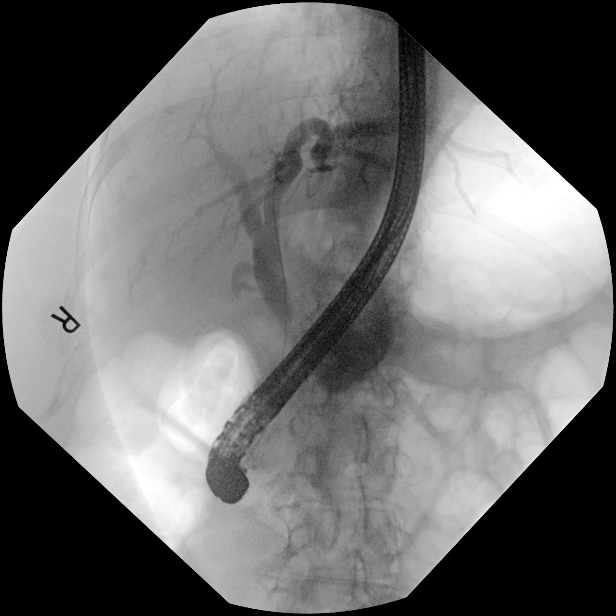
[im 10/13]
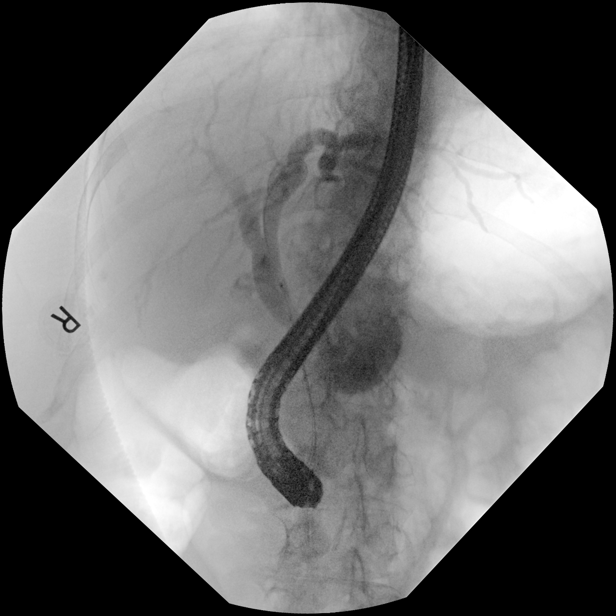
[im 11/13]
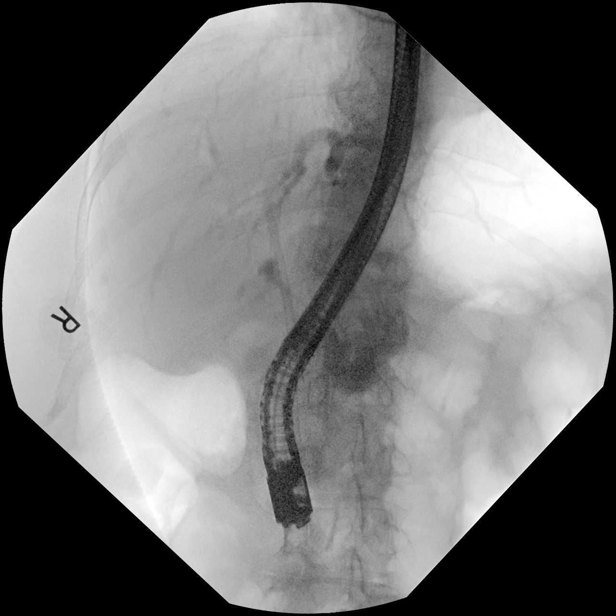
[im 12/13]
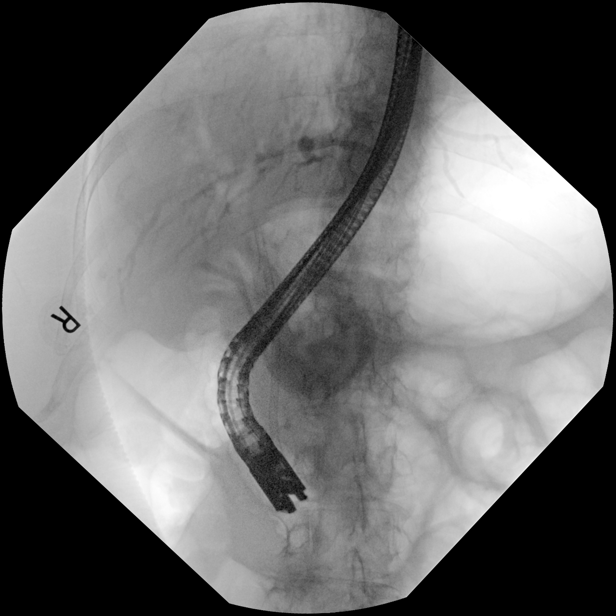
[im 13/13]
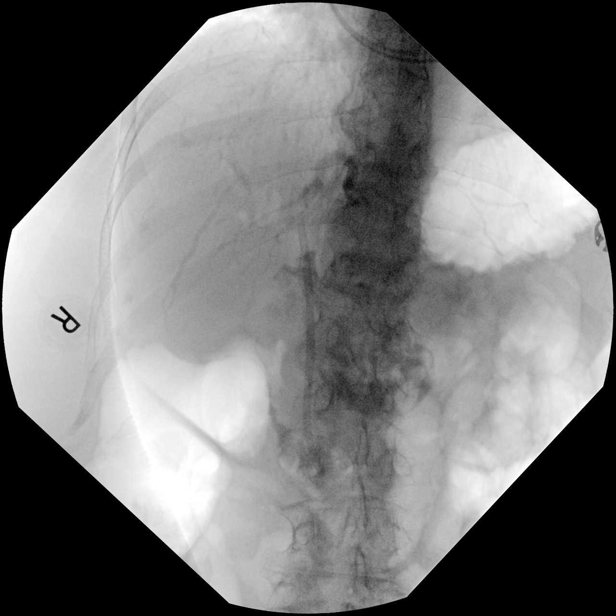

[13 of 13 positions shown; findings below may reference images not displayed]

FINDINGS: Thirteen intraoperative fluoroscopic images were provided for
interpretation.

The submitted images demonstrate access of the intra and
extrahepatic bile ducts with opacification. Although no filling
defects are seen within the common bile duct, the distal segment is
obscured by overlying endoscope. There is free flow of contrast into
the duodenum. Final images demonstrate common bile duct stent in
place.
IMPRESSION: ERCP images as above.

These images were submitted for radiologic interpretation only.
Please see the procedural report for the amount of contrast and the
fluoroscopy time utilized.

## 2024-03-30 IMAGING — CT CT CHEST-ABD-PELV W/O CM
2 of 4 series · 13 of 36 positions shown, 15 images · non-contrast
Comparison: Abdominal MRI dated 09/08/2021.

CLINICAL DATA: Adenocarcinoma.



[Series 2: cap without · axial · non-contrast · 0.64mm/px · z∈[+882,+1386]mm · 10 of 125 slices shown, 12 images]
[im 12/125  mediastinal]
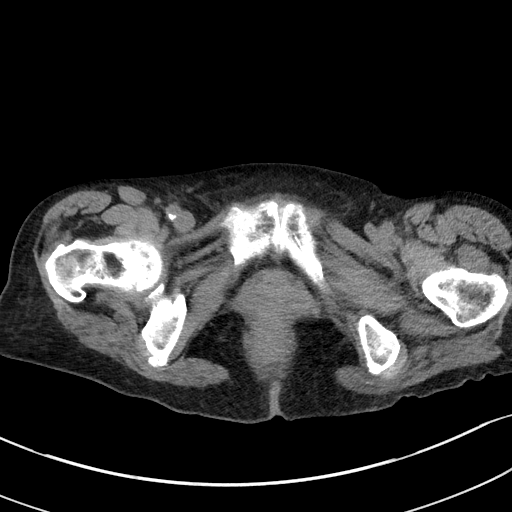
[im 12/125  bone]
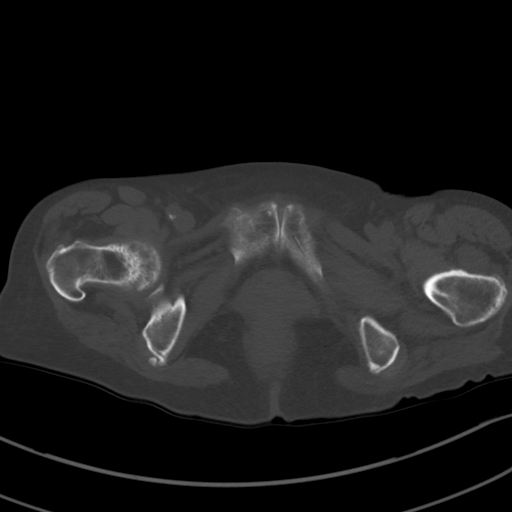
[im 23/125  mediastinal]
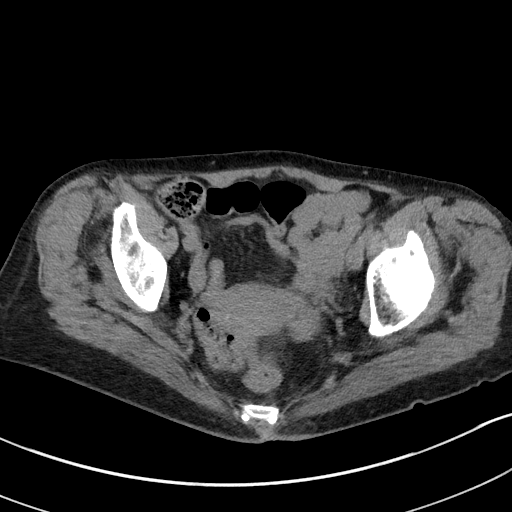
[im 34/125  mediastinal]
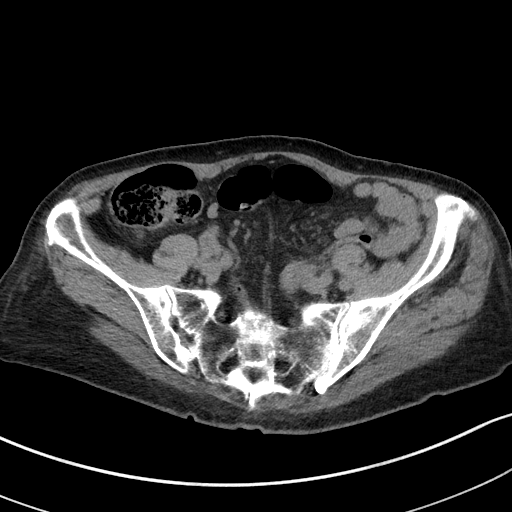
[im 46/125  mediastinal]
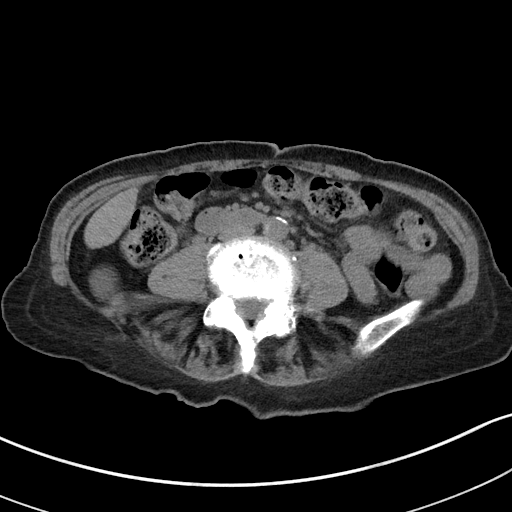
[im 57/125  mediastinal]
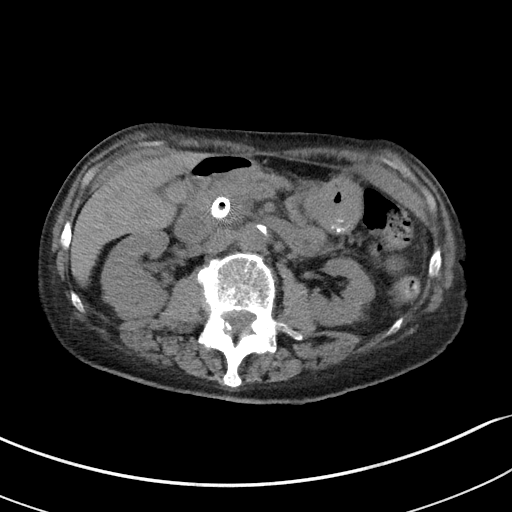
[im 68/125  mediastinal]
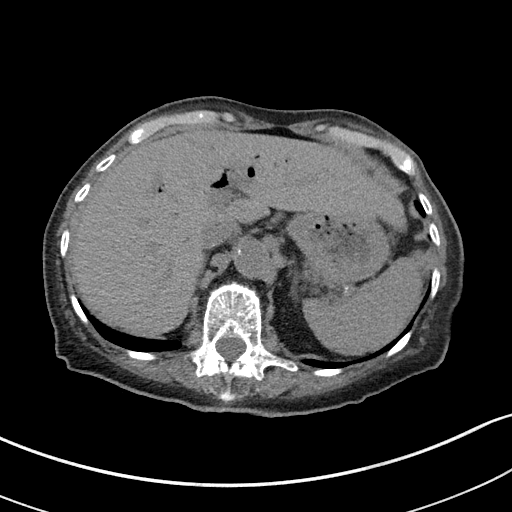
[im 79/125  mediastinal]
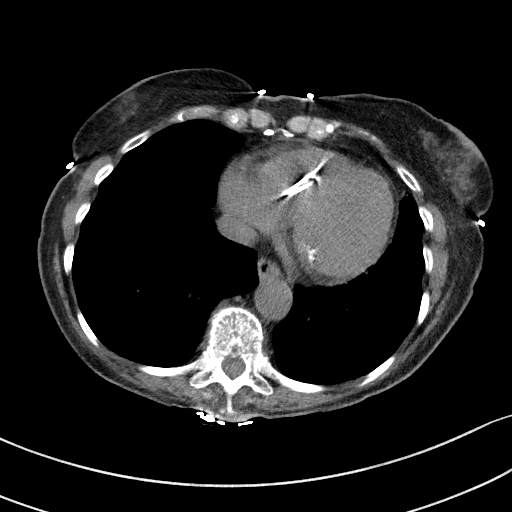
[im 91/125  mediastinal]
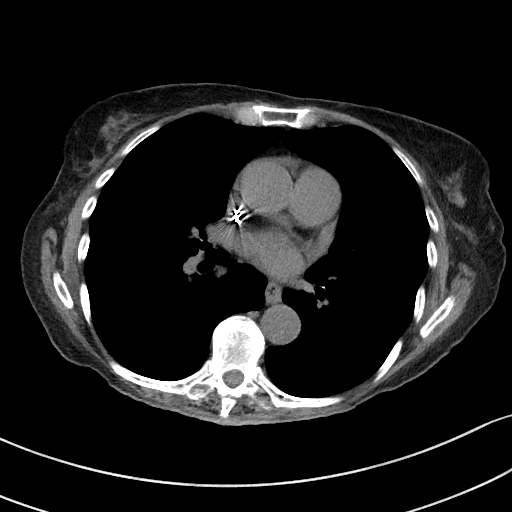
[im 102/125  mediastinal]
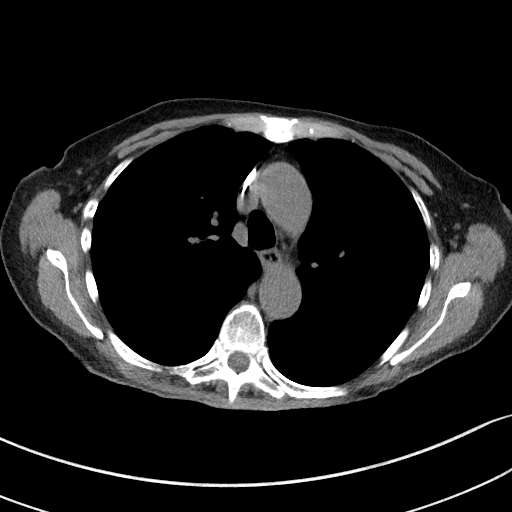
[im 102/125  bone]
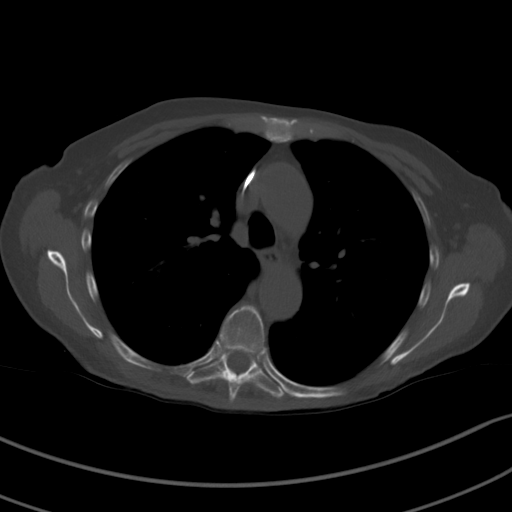
[im 113/125  mediastinal]
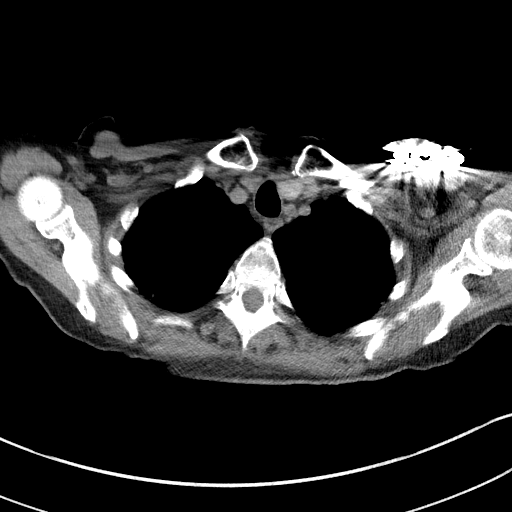

[Series 5: coronal · coronal · 0.77mm/px · 3 of 126 slices shown]
[im 26/126  mediastinal]
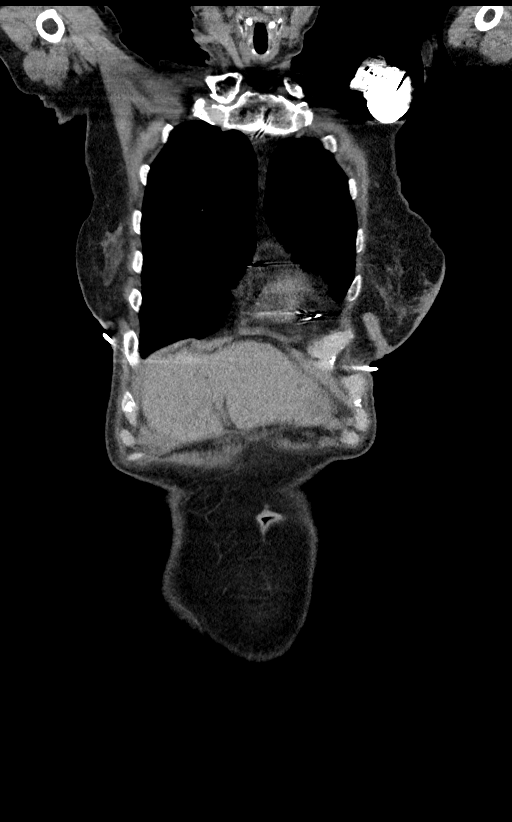
[im 51/126  mediastinal]
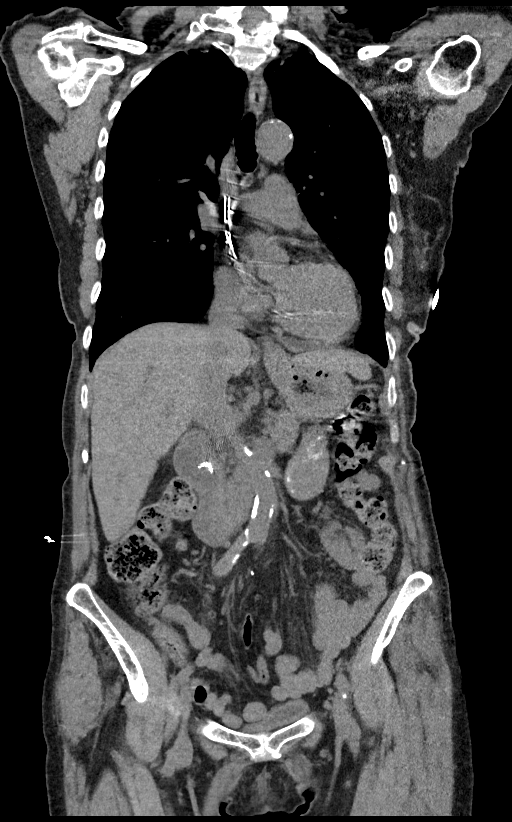
[im 76/126  mediastinal]
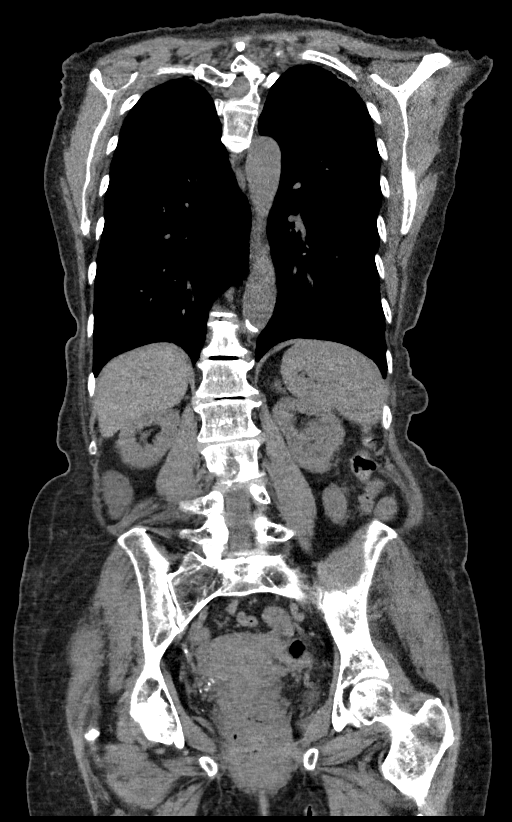

[13 of 36 positions shown; findings below may reference images not displayed]

FINDINGS: Evaluation of this exam is limited in the absence of intravenous
contrast.

CT CHEST FINDINGS

Cardiovascular: There is no cardiomegaly or pericardial effusion.
There is coronary vascular calcification. Left pectoral pacemaker
device. Mild atherosclerotic calcification of the thoracic aorta. No
aneurysmal dilatation. The central pulmonary arteries are grossly
unremarkable.

Mediastinum/Nodes: No hilar or mediastinal adenopathy. The esophagus
and the thyroid gland are grossly unremarkable. No mediastinal fluid
collection.

Lungs/Pleura: There is a 3 mm left lower lobe nodule (112/3).
Additional 2-3 mm nodules in the right middle lobe. No focal
consolidation, pleural effusion, or pneumothorax. The central
airways are patent.

Musculoskeletal: No acute osseous pathology.

CT ABDOMEN PELVIS FINDINGS

No intra-abdominal free air or free fluid.

Hepatobiliary: The liver is unremarkable. There is mild biliary
ductal dilatation and pneumobilia. A common bile duct stent is in
place. There is air in the proximal half of the stent lumen. Debris
or soft tissue noted in the distal stent. High attenuating content
within the gallbladder, likely related to recent ERCP.

Pancreas: The pancreas is suboptimally visualized but grossly
unremarkable. No active inflammatory changes. No dilatation of the
main pancreatic duct or gland atrophy.

Spleen: Normal in size without focal abnormality.

Adrenals/Urinary Tract: The adrenal glands are unremarkable. There
is a 4 cm exophytic right renal cyst. Punctate nonobstructing left
renal inferior pole calculus versus vascular calcification. There is
no hydronephrosis or obstructing stone on either side. The
visualized ureters and urinary bladder appear unremarkable.

Stomach/Bowel: There is no bowel obstruction or active inflammation.
Moderate stool throughout the colon. The appendix is normal.

Vascular/Lymphatic: Mild aortoiliac atherosclerotic disease. The IVC
is grossly unremarkable in this noncontrast CT. No portal venous
gas. There is no adenopathy.

Reproductive: The uterus and ovaries are grossly unremarkable. No
adnexal masses.

Other: None

Musculoskeletal: Osteopenia with degenerative changes of spine. No
acute osseous pathology.
IMPRESSION: 1. No acute intrathoracic, abdominal, or pelvic pathology.
2. Common bile duct stent in place with mild biliary ductal
dilatation and pneumobilia.
3. No hydronephrosis or obstructing stone on either side.
4. No bowel obstruction. Normal appendix.
5. Small pulmonary nodules measure up to 3 mm.
6. Aortic Atherosclerosis ([id number]).
